# Patient Record
Sex: Female | Born: 1935
Health system: Southern US, Community
[De-identification: ages and names within clinical notes are randomized; demographics above are authoritative.]

## PROBLEM LIST (undated history)

## (undated) DIAGNOSIS — J309 Allergic rhinitis, unspecified: Secondary | ICD-10-CM

## (undated) DIAGNOSIS — I639 Cerebral infarction, unspecified: Secondary | ICD-10-CM

## (undated) DIAGNOSIS — B3731 Acute candidiasis of vulva and vagina: Secondary | ICD-10-CM

## (undated) DIAGNOSIS — M8580 Other specified disorders of bone density and structure, unspecified site: Secondary | ICD-10-CM

## (undated) DIAGNOSIS — R159 Full incontinence of feces: Secondary | ICD-10-CM

## (undated) DIAGNOSIS — M1990 Unspecified osteoarthritis, unspecified site: Secondary | ICD-10-CM

## (undated) DIAGNOSIS — M899 Disorder of bone, unspecified: Secondary | ICD-10-CM

## (undated) DIAGNOSIS — I10 Essential (primary) hypertension: Secondary | ICD-10-CM

## (undated) DIAGNOSIS — D219 Benign neoplasm of connective and other soft tissue, unspecified: Secondary | ICD-10-CM

## (undated) DIAGNOSIS — J189 Pneumonia, unspecified organism: Secondary | ICD-10-CM

## (undated) DIAGNOSIS — G459 Transient cerebral ischemic attack, unspecified: Secondary | ICD-10-CM

## (undated) DIAGNOSIS — G43909 Migraine, unspecified, not intractable, without status migrainosus: Secondary | ICD-10-CM

## (undated) DIAGNOSIS — B373 Candidiasis of vulva and vagina: Secondary | ICD-10-CM

## (undated) DIAGNOSIS — M949 Disorder of cartilage, unspecified: Secondary | ICD-10-CM

## (undated) DIAGNOSIS — N3020 Other chronic cystitis without hematuria: Secondary | ICD-10-CM

## (undated) DIAGNOSIS — N8111 Cystocele, midline: Secondary | ICD-10-CM

## (undated) DIAGNOSIS — N6459 Other signs and symptoms in breast: Secondary | ICD-10-CM

## (undated) DIAGNOSIS — I480 Paroxysmal atrial fibrillation: Secondary | ICD-10-CM

## (undated) DIAGNOSIS — K449 Diaphragmatic hernia without obstruction or gangrene: Secondary | ICD-10-CM

## (undated) HISTORY — DX: Acute candidiasis of vulva and vagina: B37.31

## (undated) HISTORY — DX: Cystocele, midline: N81.11

## (undated) HISTORY — DX: Benign neoplasm of connective and other soft tissue, unspecified: D21.9

## (undated) HISTORY — DX: Paroxysmal atrial fibrillation: I48.0

## (undated) HISTORY — DX: Full incontinence of feces: R15.9

## (undated) HISTORY — DX: Other specified disorders of bone density and structure, unspecified site: M85.80

## (undated) HISTORY — PX: DILATION AND CURETTAGE OF UTERUS: SHX78

## (undated) HISTORY — DX: Disorder of cartilage, unspecified: M94.9

## (undated) HISTORY — DX: Other chronic cystitis without hematuria: N30.20

## (undated) HISTORY — DX: Allergic rhinitis, unspecified: J30.9

## (undated) HISTORY — DX: Transient cerebral ischemic attack, unspecified: G45.9

## (undated) HISTORY — DX: Candidiasis of vulva and vagina: B37.3

## (undated) HISTORY — PX: CATARACT EXTRACTION W/ INTRAOCULAR LENS IMPLANT: SHX1309

## (undated) HISTORY — DX: Other signs and symptoms in breast: N64.59

## (undated) HISTORY — DX: Disorder of bone, unspecified: M89.9

---
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
## 1998-01-01 ENCOUNTER — Other Ambulatory Visit: Admission: RE | Admit: 1998-01-01 | Discharge: 1998-01-01 | Payer: Self-pay | Admitting: *Deleted

## 1999-01-07 ENCOUNTER — Other Ambulatory Visit: Admission: RE | Admit: 1999-01-07 | Discharge: 1999-01-07 | Payer: Self-pay | Admitting: *Deleted

## 1999-01-08 ENCOUNTER — Encounter (INDEPENDENT_AMBULATORY_CARE_PROVIDER_SITE_OTHER): Payer: Self-pay

## 1999-01-08 ENCOUNTER — Other Ambulatory Visit: Admission: RE | Admit: 1999-01-08 | Discharge: 1999-01-08 | Payer: Self-pay | Admitting: *Deleted

## 1999-03-16 ENCOUNTER — Encounter (INDEPENDENT_AMBULATORY_CARE_PROVIDER_SITE_OTHER): Payer: Self-pay | Admitting: Specialist

## 1999-03-16 ENCOUNTER — Ambulatory Visit (HOSPITAL_COMMUNITY): Admission: RE | Admit: 1999-03-16 | Discharge: 1999-03-16 | Payer: Self-pay | Admitting: *Deleted

## 1999-03-16 HISTORY — PX: HYSTEROSCOPY WITH D & C: SHX1775

## 2000-01-27 ENCOUNTER — Other Ambulatory Visit: Admission: RE | Admit: 2000-01-27 | Discharge: 2000-01-27 | Payer: Self-pay | Admitting: *Deleted

## 2005-09-17 HISTORY — PX: VAGINAL HYSTERECTOMY: SUR661

## 2005-10-06 ENCOUNTER — Encounter (INDEPENDENT_AMBULATORY_CARE_PROVIDER_SITE_OTHER): Payer: Self-pay | Admitting: *Deleted

## 2005-10-06 ENCOUNTER — Ambulatory Visit (HOSPITAL_COMMUNITY): Admission: RE | Admit: 2005-10-06 | Discharge: 2005-10-07 | Payer: Self-pay | Admitting: Obstetrics and Gynecology

## 2005-12-24 ENCOUNTER — Ambulatory Visit: Payer: Self-pay | Admitting: Family Medicine

## 2006-02-11 ENCOUNTER — Ambulatory Visit: Payer: Self-pay | Admitting: Family Medicine

## 2006-02-25 ENCOUNTER — Ambulatory Visit: Payer: Self-pay | Admitting: Family Medicine

## 2006-03-03 ENCOUNTER — Ambulatory Visit: Payer: Self-pay | Admitting: Family Medicine

## 2006-04-13 ENCOUNTER — Ambulatory Visit: Payer: Self-pay | Admitting: Family Medicine

## 2007-06-12 ENCOUNTER — Ambulatory Visit: Payer: Self-pay | Admitting: Family Medicine

## 2007-08-25 ENCOUNTER — Ambulatory Visit: Payer: Self-pay | Admitting: Family Medicine

## 2008-12-17 ENCOUNTER — Ambulatory Visit: Payer: Self-pay | Admitting: Family Medicine

## 2009-01-02 ENCOUNTER — Ambulatory Visit: Payer: Self-pay | Admitting: Family Medicine

## 2009-03-06 ENCOUNTER — Ambulatory Visit: Payer: Self-pay | Admitting: Family Medicine

## 2010-04-02 ENCOUNTER — Ambulatory Visit: Payer: Self-pay | Admitting: Family Medicine

## 2010-09-04 NOTE — Op Note (Signed)
[name], [name]                 ACCOUNT NO.:  [id number]   MEDICAL RECORD NO.:  [id number]          PATIENT TYPE:  OIB   LOCATION:  9306                          FACILITY:  [facility]   PHYSICIAN:  Sherry A. Dickstein, M.D.DATE OF BIRTH:  09/20/1930   DATE OF PROCEDURE:  10/06/2005  DATE OF DISCHARGE:                                 OPERATIVE REPORT   PREOPERATIVE DIAGNOSIS:  Cystocele.   POSTOPERATIVE DIAGNOSES:  1.  Cystocele.  2.  Uterine prolapse.  3.  Urethral caruncle.   PROCEDURES:  1.  Total vaginal hysterectomy.  2.  Anterior repair.  3.  Removal of urethral caruncle.   SURGEON:  Sherry A. Rosalio Macadamia, M.D.   ASSISTANT:  Maxie Better, M.D.   ANESTHESIA:  Spinal.   INDICATIONS:  This is a 77 year old G2, P2-0-0-2, woman who has had  significant symptomatic cystocele that has been getting worse over the past  several months.  The patient complains of pelvic pressure, vaginal pressure  and feeling like her bladder is dropping out of the vagina.  Because of this  the patient was evaluated and found to have a second to third degree  cystocele.  At the time in the office, the cervix was felt to not be  prolapsing and therefore the patient was prepared for an anterior repair.  Preoperatively it was explained to the patient that if at the time of  surgery when she was relaxed the cervix was descending to the introitus,  then a vaginal hysterectomy would be performed at that time.  Therefore, the  patient was brought to the operating room for cystocele repair and  evaluation of her cervix and uterus.   FINDINGS:  Second to third degree cystocele, urethral caruncle and second to  third degree cervical-uterine prolapse.  Normal fallopian tubes.  Normal  atrophic ovaries.   PROCEDURE:  The patient was brought into the operating room and given  adequate spinal anesthesia.  She was placed in a dorsal lithotomy position.  Her perineum and vagina were washed with Betadine.   She was draped in  sterile fashion.  A Foley catheter was inserted into the bladder.  A  weighted speculum was placed within the vagina.  Once the speculum was  placed and a Deaver retractor was placed, a single-tooth tenaculum was  placed on the cervix.  With just very moderate pressure the cervix was  brought down to within 1-2 cm of the introitus.  Because of this descensus,  it was felt that a vaginal hysterectomy was indicated.  Therefore, a  Jacobson tenaculum was placed on the anterior lip of the cervix.  The cervix  was infiltrated with 1% Xylocaine with 1:100,000 epinephrine.  The cervix  was circumcised circumferentially.  The vaginal mucosa was dissected off of  the cervix with blunt dissection.  The posterior cul-de-sac was identified  and it was entered sharply.  The posterior vaginal wall was then reefed with  a 0 Vicryl running locked stitch for hemostasis.  Heaney clamps were placed  across the uterosacral ligaments on either side.  These were clamped, cut,  and suture ligated.  The bladder was developed off of the cervix with blunt  dissection.  The anterior peritoneum was identified.  It was entered sharply  and a Deaver retractor was placed within the space.  Then using a long  LigaSure on alternating sides, the cardinal ligaments were clamped,  cauterized x3, and then cut.  This was done until the utero-ovarian  ligaments could be well-identified.  The round ligaments and top of the  utero-ovarian ligaments were then clamped, cut and suture ligated with 0  Vicryl LigaSure.  The uterus was removed in this fashion.  There were small  bleeders between the uterosacral ligaments and the end of the vaginal  reefing stitch.  These areas were then closed with 0 Vicryl in figure-of-  eight stitches.  Adequate hemostasis was obtained along this area.  A  posterior plication stitch was taken with 0 Vicryl from the uterosacral  ligament across the posterior cul-de-sac to the next  uterosacral ligament.  This was not tied at this point in time.  All sutures were felt to have  adequate hemostasis.  The posterior vaginal wall was then closed using 0  Vicryl in figure-of-eight stitches.  The peritoneal Kelly plication stitch  was then tied separately.  The cystocele repair was then performed.   The vaginal mucosa was dissected off the bladder with blunt and sharp  dissection.  This was performed up to the UV junction.  There was a very  distinct UV junction so that the dissection was not necessary to be taken up  to the urethra.  Once the vaginal mucosa was well-dissected laterally, the  endopelvic fascia was able to be distinguished easily and Kelly plication  stitches were taken across the endopelvic fascia with 2-0 Vicryl in mattress-  type stitches.  There was good support to the bladder.  The extra vaginal  mucosa was excised.  The vagina was then closed with 2-0 chromic in a  running locked stitch.  There was still a small amount of space between this  stitch and the figure-of-eight 0 Vicryl stitches.  This small space was  closed with another 0 Vicryl figure-of-eight stitch.  Adequate hemostasis  was present.  The vagina was then packed with 2-inch plain packing that was  coated with Estrace cream.  There was a urethral caruncle present.  This was  excised using a scalpel.  The edges were then closed using 4-0 chromic in  figure-of-eight interrupted stitches.  Adequate hemostasis was present.  Marcaine 0.25% with epinephrine was injected into this site to decrease any  bleeding and for future pain relief.  The patient was then taken out of the  dorsal lithotomy position.  She was awakened.  She was moved from the  operating table to a stretcher in stable condition.   COMPLICATIONS:  None.   ESTIMATED BLOOD LOSS:  150 mL.   SPECIMENS:  1.  Uterus.  2.  Urethral caruncle.   Specimens were sent to pathology.      Sherry A. Rosalio Macadamia, M.D. Electronically  Signed     SAD/MEDQ  D:  10/06/2005  T:  10/07/2005  Job:  [id number]

## 2010-09-04 NOTE — Op Note (Signed)
[facility]  Patient:    DEZTINY SARRA                         MRN: [id number] Proc. Date: 03/16/99 Adm. Date:  [id number] Attending:  Ardeen Fillers CC:         Sung Amabile. Roslyn Smiling, M.D.                           Operative Report  INDICATIONS:                  [age]-year-old woman, G2, P2, receiving Prempro 5 mg, with spotting in July of this year.  Endometrial biopsy was performed at her annual examination and pathology revealed only benign endocervical tissue and mucus.  Normal endometrium was identified and pelvic ultrasound and sonohysterogram suggested an endometrial polyp.  She is admitted now for operative hysteroscopy and D&C.  PREOPERATIVE DIAGNOSES:       Postmenopausal bleeding.  Ultrasound consistent with endometrial polyp.  POSTOPERATIVE DIAGNOSES:      Postmenopausal bleeding.  Ultrasound consistent with endometrial polyp.  PROCEDURE:                    Operative hysteroscopy, dilatation and curettage.  SURGEON:                      Sung Amabile. Roslyn Smiling, M.D.  ANESTHESIA:                   MAC and paracervical block.  ESTIMATED BLOOD LOSS:         Less than 10 cc.  TUBES AND DRAINS:             None.  COMPLICATIONS:                None.  FINDINGS:                     Anteverted uterus sounded to 8 cm.  Polypoid mass in endometrial cavity.  SPECIMENS:                    Endometrial biopsies and curettings to pathology.  DESCRIPTION OF PROCEDURE:     After the establishment of adequate IV sedation, he patient was placed in the dorsal lithotomy position.  The perineum and vagina were prepped with Betadine solution.  The patient had emptied her bladder just before being transported to the operating room.  Patient was draped.  Examination under anesthesia was carried out.  Bivalved speculum was inserted into the vagina. The cervix was reprepped with Betadine solution.  The anterior cervical lip was infiltrated with 1%  Xylocaine and then grasped with a single-tooth tenaculum. Paracervical block was placed in the usual fashion using 1% Nesacaine (20 cc).   The uterine cavity was sounded to 8 cm.  Pratt dilators were used to serially dilate the cervical canal to a #33-French.  Operative hysteroscope was introduced easily into the endometrial cavity.  Sorbitol was the distending medium; it was  instilled at 90 mmHg pressure.  A polypoid mass was noted.  Double-loop 90 degree electrical loop was used to excise the polypoid mass with settings of 190 and 110, cutting and coagulation, respectively.  Fragments of the polypoid mass were removed.  The scope was removed.  Sharp curettage was performed.  The scope was  reinserted and residual small fragments  of tissue were removed.  Hemostasis was  noted and instruments were removed.  Pressure was held against the cervix. Hemostasis was noted.  Patient was returned to the supine position and transported to the recovery room in satisfactory condition.  Fluid deficit was 50 cc. DD:  03/16/99 TD:  03/16/99 Job: [id number]

## 2011-02-24 ENCOUNTER — Encounter (INDEPENDENT_AMBULATORY_CARE_PROVIDER_SITE_OTHER): Payer: Self-pay | Admitting: Surgery

## 2011-03-09 ENCOUNTER — Ambulatory Visit (INDEPENDENT_AMBULATORY_CARE_PROVIDER_SITE_OTHER): Payer: 59 | Admitting: Surgery

## 2011-04-03 ENCOUNTER — Other Ambulatory Visit: Payer: Self-pay | Admitting: Family Medicine

## 2011-04-14 ENCOUNTER — Telehealth: Payer: Self-pay | Admitting: Family Medicine

## 2011-04-14 NOTE — Telephone Encounter (Signed)
Left message

## 2011-06-22 ENCOUNTER — Encounter: Payer: Self-pay | Admitting: Internal Medicine

## 2011-06-29 ENCOUNTER — Encounter: Payer: Self-pay | Admitting: Family Medicine

## 2011-06-29 ENCOUNTER — Ambulatory Visit
Admission: RE | Admit: 2011-06-29 | Discharge: 2011-06-29 | Disposition: A | Discharge status: Home / Self Care | Payer: Medicare Other | Source: Ambulatory Visit | Attending: Family Medicine | Admitting: Family Medicine

## 2011-06-29 ENCOUNTER — Ambulatory Visit (INDEPENDENT_AMBULATORY_CARE_PROVIDER_SITE_OTHER): Payer: Medicare Other | Admitting: Family Medicine

## 2011-06-29 VITALS — BP 138/88 | HR 51 | Wt 160.0 lb

## 2011-06-29 DIAGNOSIS — M949 Disorder of cartilage, unspecified: Secondary | ICD-10-CM

## 2011-06-29 DIAGNOSIS — I10 Essential (primary) hypertension: Secondary | ICD-10-CM

## 2011-06-29 DIAGNOSIS — J309 Allergic rhinitis, unspecified: Secondary | ICD-10-CM | POA: Insufficient documentation

## 2011-06-29 DIAGNOSIS — M8580 Other specified disorders of bone density and structure, unspecified site: Secondary | ICD-10-CM

## 2011-06-29 DIAGNOSIS — M25511 Pain in right shoulder: Secondary | ICD-10-CM

## 2011-06-29 DIAGNOSIS — M25519 Pain in unspecified shoulder: Secondary | ICD-10-CM

## 2011-06-29 DIAGNOSIS — Z79899 Other long term (current) drug therapy: Secondary | ICD-10-CM

## 2011-06-29 DIAGNOSIS — E785 Hyperlipidemia, unspecified: Secondary | ICD-10-CM | POA: Insufficient documentation

## 2011-06-29 LAB — COMPREHENSIVE METABOLIC PANEL
BUN: 23 mg/dL (ref 6–23)
CO2: 23 mEq/L (ref 19–32)
Calcium: 9.5 mg/dL (ref 8.4–10.5)
Chloride: 101 mEq/L (ref 96–112)
Creat: 0.78 mg/dL (ref 0.50–1.10)

## 2011-06-29 LAB — CBC WITH DIFFERENTIAL/PLATELET
Eosinophils Relative: 4 % (ref 0–5)
HCT: 45 % (ref 36.0–46.0)
Lymphocytes Relative: 41 % (ref 12–46)
Lymphs Abs: 3.3 10*3/uL (ref 0.7–4.0)
MCV: 90.5 fL (ref 78.0–100.0)
Monocytes Absolute: 0.8 10*3/uL (ref 0.1–1.0)
Monocytes Relative: 11 % (ref 3–12)
RBC: 4.97 MIL/uL (ref 3.87–5.11)
RDW: 14.9 % (ref 11.5–15.5)
WBC: 7.9 10*3/uL (ref 4.0–10.5)

## 2011-06-29 LAB — LIPID PANEL
Cholesterol: 210 mg/dL — ABNORMAL HIGH (ref 0–200)
HDL: 49 mg/dL (ref >39)
Total CHOL/HDL Ratio: 4.3 Ratio

## 2011-06-29 MED ORDER — HYDROCHLOROTHIAZIDE 12.5 MG PO CAPS: 12.5000 mg | ORAL_CAPSULE | ORAL | Status: DC

## 2011-06-29 NOTE — Progress Notes (Signed)
  Subjective:    Patient ID: [name], female    DOB: 03/23/1931, 78 y.o.   MRN: [id number]  HPI She is here for an interval evaluation. She continues on medications listed in the chart and is having no difficulty with them. She has made some dietary changes to help with her cholesterol. She plans to see her gynecologist soon and will order another DEXA scan. She does complain of a 6 month history of difficulty with right shoulder pain and blames this on doing yard work. She has noted decreased range of motion due to pain because of this. She does state that this is roughly 50% better. She has mild allergies and uses OTC meds but her  Review of Systems Negative except as above    Objective:   Physical Exam alert and in no distress. Tympanic membranes and canals are normal. Throat is clear. Tonsils are normal. Neck is supple without adenopathy or thyromegaly. Cardiac exam shows a regular sinus rhythm without murmurs or gallops. Lungs are clear to auscultation. Right shoulder exam shows decreased range of motion with abduction and internal as well as external rotation area crepitus is noted upon the Neer's and Hawkins testing. No laxity noted.      Assessment & Plan:   1. Hypertension  CBC with Differential, Comprehensive metabolic panel  2. Hyperlipidemia LDL goal < 130  Lipid panel  3. Osteopenia    4. Right shoulder pain  DG Shoulder Right  5. Allergic rhinitis, mild    6. Encounter for long-term (current) use of other medications  CBC with Differential, Comprehensive metabolic panel, Lipid panel   she is to continue on her present medication regimen.

## 2011-06-29 NOTE — Patient Instructions (Signed)
I will call you with the results of the xray 

## 2011-06-30 NOTE — Progress Notes (Signed)
Quick Note:  The blood work is normal ______ 

## 2011-10-07 ENCOUNTER — Encounter: Payer: Self-pay | Admitting: Cardiology

## 2011-10-07 ENCOUNTER — Other Ambulatory Visit: Payer: Self-pay | Admitting: Cardiology

## 2012-04-19 DIAGNOSIS — I639 Cerebral infarction, unspecified: Secondary | ICD-10-CM

## 2012-04-19 HISTORY — DX: Cerebral infarction, unspecified: I63.9

## 2012-07-02 ENCOUNTER — Other Ambulatory Visit: Payer: Self-pay | Admitting: Family Medicine

## 2012-08-01 ENCOUNTER — Encounter: Payer: Self-pay | Admitting: Family Medicine

## 2012-08-01 ENCOUNTER — Ambulatory Visit (INDEPENDENT_AMBULATORY_CARE_PROVIDER_SITE_OTHER): Payer: Medicare Other | Admitting: Family Medicine

## 2012-08-01 VITALS — BP 130/86 | HR 66 | Wt 160.0 lb

## 2012-08-01 DIAGNOSIS — M949 Disorder of cartilage, unspecified: Secondary | ICD-10-CM

## 2012-08-01 DIAGNOSIS — Z79899 Other long term (current) drug therapy: Secondary | ICD-10-CM

## 2012-08-01 DIAGNOSIS — E785 Hyperlipidemia, unspecified: Secondary | ICD-10-CM

## 2012-08-01 DIAGNOSIS — I10 Essential (primary) hypertension: Secondary | ICD-10-CM

## 2012-08-01 DIAGNOSIS — M8580 Other specified disorders of bone density and structure, unspecified site: Secondary | ICD-10-CM

## 2012-08-01 DIAGNOSIS — M899 Disorder of bone, unspecified: Secondary | ICD-10-CM

## 2012-08-01 DIAGNOSIS — J309 Allergic rhinitis, unspecified: Secondary | ICD-10-CM

## 2012-08-01 LAB — LIPID PANEL
LDL Cholesterol: 124 mg/dL — ABNORMAL HIGH (ref 0–99)
Triglycerides: 173 mg/dL — ABNORMAL HIGH (ref <150)
VLDL: 35 mg/dL (ref 0–40)

## 2012-08-01 LAB — COMPREHENSIVE METABOLIC PANEL
ALT: 14 U/L (ref 0–35)
AST: 18 U/L (ref 0–37)
Albumin: 4.5 g/dL (ref 3.5–5.2)
Alkaline Phosphatase: 57 U/L (ref 39–117)
Glucose, Bld: 95 mg/dL (ref 70–99)
Potassium: 3.9 mEq/L (ref 3.5–5.3)
Sodium: 139 mEq/L (ref 135–145)
Total Protein: 7.2 g/dL (ref 6.0–8.3)

## 2012-08-01 LAB — CBC WITH DIFFERENTIAL/PLATELET
Basophils Absolute: 0 10*3/uL (ref 0.0–0.1)
Basophils Relative: 1 % (ref 0–1)
Hemoglobin: 14.5 g/dL (ref 12.0–15.0)
MCHC: 33.5 g/dL (ref 30.0–36.0)
Monocytes Relative: 9 % (ref 3–12)
Neutro Abs: 3.1 10*3/uL (ref 1.7–7.7)
Neutrophils Relative %: 40 % — ABNORMAL LOW (ref 43–77)

## 2012-08-01 MED ORDER — HYDROCHLOROTHIAZIDE 12.5 MG PO CAPS: ORAL_CAPSULE | ORAL | Status: DC

## 2012-08-01 MED ORDER — AZELASTINE HCL 0.1 % NA SOLN: 1.0000 | Freq: Two times a day (BID) | NASAL | Status: DC

## 2012-08-01 NOTE — Patient Instructions (Signed)
Keep taking good care of yourself 

## 2012-08-01 NOTE — Progress Notes (Signed)
  Subjective:    Patient ID: LAQUESHIA CIHLAR, female    DOB: 1931/02/19, 79 y.o.   MRN: [id number]  HPI She is here for medication check. She continues on Microzide for her blood pressure and is having no difficulty with this. She has a previous history of hyperlipidemia but presently is on no medications. She does have osteopenia and takes multivitamins with calcium. She did have her vitamin D checked by her gynecologist. Her allergies still give her some trouble when she does use Astelin which gives her good relief. She keeps himself busy. She has no other concerns or complaints. Social history was reviewed. Her marriage continues to do quite well.   Review of Systems     Objective:   Physical Exam alert and in no distress. Tympanic membranes and canals are normal. Throat is clear. Tonsils are normal. Neck is supple without adenopathy or thyromegaly. Cardiac exam shows a regular sinus rhythm without murmurs or gallops. Lungs are clear to auscultation.        Assessment & Plan:  Hypertension - Plan: hydrochlorothiazide (MICROZIDE) 12.5 MG capsule, CBC with Differential, Comprehensive metabolic panel  Hyperlipidemia LDL goal < 130 - Plan: Lipid panel  Osteopenia - Plan: CBC with Differential, Comprehensive metabolic panel  Allergic rhinitis, mild - Plan: azelastine (ASTELIN) 137 MCG/SPRAY nasal spray  Encounter for long-term (current) use of other medications - Plan: Lipid panel, CBC with Differential, Comprehensive metabolic panel encouraged her to maintain her active lifestyle. She will let me know if she needs more medications for her underlying allergies.

## 2012-08-02 NOTE — Progress Notes (Signed)
Quick Note:  PATIENT INFORMED LABS UNCHANGED PATIENT VERBALIZED UNDERSTANDING ______

## 2012-08-26 ENCOUNTER — Emergency Department (HOSPITAL_COMMUNITY): Payer: Medicare Other

## 2012-08-26 ENCOUNTER — Observation Stay (HOSPITAL_COMMUNITY)
Admission: EM | Admit: 2012-08-26 | Discharge: 2012-08-27 | Disposition: A | Discharge status: Home / Self Care | Payer: Medicare Other | Attending: Internal Medicine | Admitting: Internal Medicine

## 2012-08-26 ENCOUNTER — Telehealth: Payer: Self-pay | Admitting: Family Medicine

## 2012-08-26 ENCOUNTER — Observation Stay (HOSPITAL_COMMUNITY): Payer: Medicare Other

## 2012-08-26 ENCOUNTER — Encounter (HOSPITAL_COMMUNITY): Payer: Self-pay | Admitting: Cardiology

## 2012-08-26 DIAGNOSIS — R42 Dizziness and giddiness: Secondary | ICD-10-CM | POA: Insufficient documentation

## 2012-08-26 DIAGNOSIS — R471 Dysarthria and anarthria: Secondary | ICD-10-CM | POA: Insufficient documentation

## 2012-08-26 DIAGNOSIS — I10 Essential (primary) hypertension: Secondary | ICD-10-CM

## 2012-08-26 DIAGNOSIS — J309 Allergic rhinitis, unspecified: Secondary | ICD-10-CM | POA: Insufficient documentation

## 2012-08-26 DIAGNOSIS — G459 Transient cerebral ischemic attack, unspecified: Principal | ICD-10-CM

## 2012-08-26 DIAGNOSIS — E785 Hyperlipidemia, unspecified: Secondary | ICD-10-CM | POA: Insufficient documentation

## 2012-08-26 DIAGNOSIS — N390 Urinary tract infection, site not specified: Secondary | ICD-10-CM | POA: Diagnosis present

## 2012-08-26 DIAGNOSIS — Z8673 Personal history of transient ischemic attack (TIA), and cerebral infarction without residual deficits: Secondary | ICD-10-CM | POA: Diagnosis present

## 2012-08-26 DIAGNOSIS — N3020 Other chronic cystitis without hematuria: Secondary | ICD-10-CM | POA: Insufficient documentation

## 2012-08-26 DIAGNOSIS — Z87448 Personal history of other diseases of urinary system: Secondary | ICD-10-CM | POA: Diagnosis present

## 2012-08-26 HISTORY — DX: Essential (primary) hypertension: I10

## 2012-08-26 LAB — CBC
HCT: 44.8 % (ref 36.0–46.0)
HCT: 43.4 % (ref 36.0–46.0)
Hemoglobin: 15.3 g/dL — ABNORMAL HIGH (ref 12.0–15.0)
Hemoglobin: 14.9 g/dL (ref 12.0–15.0)
MCH: 29.8 pg (ref 26.0–34.0)
MCH: 29.8 pg (ref 26.0–34.0)
MCHC: 34.2 g/dL (ref 30.0–36.0)
MCHC: 34.3 g/dL (ref 30.0–36.0)
MCV: 87.2 fL (ref 78.0–100.0)
Platelets: 296 10*3/uL (ref 150–400)
RBC: 5.14 MIL/uL — ABNORMAL HIGH (ref 3.87–5.11)
RBC: 5 MIL/uL (ref 3.87–5.11)
RDW: 14 % (ref 11.5–15.5)
WBC: 9 10*3/uL (ref 4.0–10.5)

## 2012-08-26 LAB — HEMOGLOBIN A1C: Hgb A1c MFr Bld: 5.8 % — ABNORMAL HIGH (ref <5.7)

## 2012-08-26 LAB — URINALYSIS, ROUTINE W REFLEX MICROSCOPIC
Bilirubin Urine: NEGATIVE
Glucose, UA: NEGATIVE mg/dL
Hgb urine dipstick: NEGATIVE
Ketones, ur: NEGATIVE mg/dL
Nitrite: NEGATIVE
Protein, ur: NEGATIVE mg/dL
Specific Gravity, Urine: 1.014 (ref 1.005–1.030)
Urobilinogen, UA: 0.2 mg/dL (ref 0.0–1.0)
pH: 5.5 (ref 5.0–8.0)

## 2012-08-26 LAB — PROTIME-INR
INR: 0.96 (ref 0.00–1.49)
Prothrombin Time: 12.7 seconds (ref 11.6–15.2)

## 2012-08-26 LAB — COMPREHENSIVE METABOLIC PANEL WITH GFR
Albumin: 3.9 g/dL (ref 3.5–5.2)
BUN: 23 mg/dL (ref 6–23)
Creatinine, Ser: 0.82 mg/dL (ref 0.50–1.10)
Total Protein: 7.6 g/dL (ref 6.0–8.3)

## 2012-08-26 LAB — RAPID URINE DRUG SCREEN, HOSP PERFORMED
Amphetamines: NOT DETECTED
Barbiturates: NOT DETECTED
Benzodiazepines: NOT DETECTED
Cocaine: NOT DETECTED
Opiates: NOT DETECTED
Tetrahydrocannabinol: NOT DETECTED

## 2012-08-26 LAB — COMPREHENSIVE METABOLIC PANEL
ALT: 18 U/L (ref 0–35)
AST: 24 U/L (ref 0–37)
Alkaline Phosphatase: 58 U/L (ref 39–117)
CO2: 25 mEq/L (ref 19–32)
Calcium: 10 mg/dL (ref 8.4–10.5)
Chloride: 100 mEq/L (ref 96–112)
GFR calc Af Amer: 76 mL/min — ABNORMAL LOW (ref >90)
GFR calc non Af Amer: 66 mL/min — ABNORMAL LOW (ref >90)
Glucose, Bld: 92 mg/dL (ref 70–99)
Potassium: 3.7 mEq/L (ref 3.5–5.1)
Sodium: 141 mEq/L (ref 135–145)
Total Bilirubin: 0.2 mg/dL — ABNORMAL LOW (ref 0.3–1.2)

## 2012-08-26 LAB — POCT I-STAT, CHEM 8
BUN: 23 mg/dL (ref 6–23)
Calcium, Ion: 1.21 mmol/L (ref 1.13–1.30)
Chloride: 106 meq/L (ref 96–112)
Creatinine, Ser: 0.8 mg/dL (ref 0.50–1.10)
Glucose, Bld: 91 mg/dL (ref 70–99)
HCT: 46 % (ref 36.0–46.0)
Hemoglobin: 15.6 g/dL — ABNORMAL HIGH (ref 12.0–15.0)
Potassium: 3.5 mEq/L (ref 3.5–5.1)
Sodium: 140 meq/L (ref 135–145)
TCO2: 27 mmol/L (ref 0–100)

## 2012-08-26 LAB — DIFFERENTIAL
Basophils Absolute: 0.1 10*3/uL (ref 0.0–0.1)
Basophils Relative: 1 % (ref 0–1)
Eosinophils Absolute: 0.3 K/uL (ref 0.0–0.7)
Eosinophils Relative: 3 % (ref 0–5)
Lymphocytes Relative: 43 % (ref 12–46)
Lymphs Abs: 3.8 10*3/uL (ref 0.7–4.0)
Monocytes Absolute: 0.9 K/uL (ref 0.1–1.0)
Monocytes Relative: 10 % (ref 3–12)
Neutro Abs: 3.9 10*3/uL (ref 1.7–7.7)
Neutrophils Relative %: 43 % (ref 43–77)

## 2012-08-26 LAB — URINE MICROSCOPIC-ADD ON

## 2012-08-26 LAB — TROPONIN I: Troponin I: <0.3 ng/mL (ref <0.30)

## 2012-08-26 LAB — ETHANOL: Alcohol, Ethyl (B): <11 mg/dL (ref 0–11)

## 2012-08-26 LAB — POCT I-STAT TROPONIN I: Troponin i, poc: 0 ng/mL (ref 0.00–0.08)

## 2012-08-26 LAB — TSH: TSH: 3.241 u[IU]/mL (ref 0.350–4.500)

## 2012-08-26 LAB — CREATININE, SERUM: GFR calc non Af Amer: 63 mL/min — ABNORMAL LOW (ref >90)

## 2012-08-26 LAB — APTT: aPTT: 27 seconds (ref 24–37)

## 2012-08-26 MED ORDER — HYDRALAZINE HCL 20 MG/ML IJ SOLN: 10.0000 mg | Freq: Four times a day (QID) | INTRAMUSCULAR | Status: DC | PRN

## 2012-08-26 MED ORDER — HEPARIN SODIUM (PORCINE) 5000 UNIT/ML IJ SOLN
5000.0000 [IU] | Freq: Three times a day (TID) | INTRAMUSCULAR | Status: DC
  Administered 2012-08-26 – 2012-08-27 (×2): 5000 [IU] via SUBCUTANEOUS
  Filled 2012-08-26 (×8): qty 1

## 2012-08-26 MED ORDER — ACETAMINOPHEN 325 MG PO TABS
650.0000 mg | ORAL_TABLET | Freq: Four times a day (QID) | ORAL | Status: DC | PRN
  Administered 2012-08-26: 650 mg via ORAL
  Filled 2012-08-26: qty 2

## 2012-08-26 MED ORDER — HYDROCHLOROTHIAZIDE 12.5 MG PO CAPS
12.5000 mg | ORAL_CAPSULE | Freq: Every day | ORAL | Status: DC
  Administered 2012-08-27: 12.5 mg via ORAL
  Filled 2012-08-26: qty 1

## 2012-08-26 MED ORDER — ASPIRIN 81 MG PO CHEW
81.0000 mg | CHEWABLE_TABLET | Freq: Once | ORAL | Status: AC
  Administered 2012-08-26: 81 mg via ORAL
  Filled 2012-08-26: qty 1

## 2012-08-26 MED ORDER — LORAZEPAM 2 MG/ML IJ SOLN
0.5000 mg | Freq: Once | INTRAMUSCULAR | Status: AC
  Administered 2012-08-26: 0.5 mg via INTRAVENOUS
  Filled 2012-08-26: qty 1

## 2012-08-26 MED ORDER — LORATADINE 10 MG PO TABS
5.0000 mg | ORAL_TABLET | Freq: Every day | ORAL | Status: DC | PRN
  Filled 2012-08-26: qty 0.5

## 2012-08-26 MED ORDER — POLYVINYL ALCOHOL 1.4 % OP SOLN
2.0000 [drp] | Freq: Four times a day (QID) | OPHTHALMIC | Status: DC | PRN
  Filled 2012-08-26: qty 15

## 2012-08-26 MED ORDER — POLYETHYL GLYCOL-PROPYL GLYCOL 0.4-0.3 % OP SOLN: 2.0000 [drp] | Freq: Four times a day (QID) | OPHTHALMIC | Status: DC | PRN

## 2012-08-26 MED ORDER — CALCIUM CARBONATE-VITAMIN D 500-200 MG-UNIT PO TABS
1.0000 | ORAL_TABLET | Freq: Every day | ORAL | Status: DC
  Administered 2012-08-26 – 2012-08-27 (×2): 1 via ORAL
  Filled 2012-08-26 (×3): qty 1

## 2012-08-26 MED ORDER — CLOPIDOGREL BISULFATE 75 MG PO TABS
75.0000 mg | ORAL_TABLET | Freq: Every day | ORAL | Status: DC
  Administered 2012-08-27: 75 mg via ORAL
  Filled 2012-08-26: qty 1

## 2012-08-26 MED ORDER — CALCIUM CARBONATE-VIT D-MIN 600-400 MG-UNIT PO TABS: 1.0000 | ORAL_TABLET | Freq: Every day | ORAL | Status: DC

## 2012-08-26 MED ORDER — LORAZEPAM BOLUS VIA INFUSION: 0.5000 mg | Freq: Once | INTRAVENOUS | Status: DC

## 2012-08-26 MED ORDER — SIMVASTATIN 20 MG PO TABS
20.0000 mg | ORAL_TABLET | Freq: Every day | ORAL | Status: DC
  Administered 2012-08-26: 20 mg via ORAL
  Filled 2012-08-26 (×3): qty 1

## 2012-08-26 MED ORDER — SODIUM CHLORIDE 0.9 % IV SOLN: INTRAVENOUS | Status: AC

## 2012-08-26 MED ORDER — AZELASTINE HCL 0.1 % NA SOLN
1.0000 | Freq: Two times a day (BID) | NASAL | Status: DC
  Administered 2012-08-26 – 2012-08-27 (×2): 1 via NASAL
  Filled 2012-08-26: qty 30

## 2012-08-26 NOTE — ED Notes (Signed)
Patient transported to CT 

## 2012-08-26 NOTE — ED Notes (Signed)
Pt reports she has had a headache for the past day with some stiffness in her head. States this morning when she woke up she felt like she could not get her words out right. States the symptoms did resolve at this time. Denies any other neuro deficits. States she has had 3 episodes where she felt like she could not get her words out right this morning. Pt currently with no neuro deficits.

## 2012-08-26 NOTE — ED Provider Notes (Signed)
History     CSN: [id number]  Arrival date & time 08/26/12  1038   First MD Initiated Contact with Patient 08/26/12 1054      Chief Complaint  Patient presents with  . Headache  . Aphasia    (Consider location/radiation/quality/duration/timing/severity/associated sxs/prior treatment) HPI Comments: Patient with no history of coronary disease, stroke or TIA reports that she got up as usual this morning. She reports that she felt like she had a touch of vertigo and did have a left parietal region headache that seems resolved today. When she got up this morning at 5:30 to use the bathroom, she did take her time, sat on the edge of the bed for about 5 minutes, felt normal and proceeded to go the bathroom. She reports later this morning when her spouse came downstairs and she tried to speak to him she had about 15-30 seconds of having word finding difficulty. She reports she knew what she wanted to say, but was not able to get the words out. This occurred 3 times in total over the course of this morning, and currently reports resolution of symptoms. She denied any facial droop, visual changes, focal numbness or weakness of her arms or legs. No gait abnormality. She did not take any medications specific for her symptoms. She denies any headache, neck pain, chest pain or palpitations currently.  Patient is a 79 y.o. female presenting with headaches. The history is provided by the patient and the spouse.  Headache Associated symptoms: dizziness   Associated symptoms: no abdominal pain, no fever, no nausea and no photophobia     Past Medical History  Diagnosis Date  . Candidiasis of vulva and vagina   . Other chronic cystitis   . Cystocele, midline   . Disorder of bone and cartilage, unspecified   . Other sign and symptom in breast   . Rectal incontinence   . Fibroids   . Hypertension     Past Surgical History  Procedure Laterality Date  . Hysteroscopy w/d&c  03/16/1999    Family  History  Problem Relation Age of Onset  . Hypertension Mother   . Heart disease Father     History  Substance Use Topics  . Smoking status: Never Smoker   . Smokeless tobacco: Not on file  . Alcohol Use: Yes    OB History   Grav Para Term Preterm Abortions TAB SAB Ect Mult Living                  Review of Systems  Constitutional: Negative for fever and chills.  Eyes: Negative for photophobia and visual disturbance.  Cardiovascular: Negative for chest pain and palpitations.  Gastrointestinal: Negative for nausea and abdominal pain.  Neurological: Positive for dizziness, speech difficulty and headaches. Negative for syncope, weakness and light-headedness.  All other systems reviewed and are negative.    Allergies  Review of patient's allergies indicates no known allergies.  Home Medications   Current Outpatient Rx  Name  Route  Sig  Dispense  Refill  . aspirin 81 MG tablet   Oral   Take 81 mg by mouth daily.           Marland Kitchen azelastine (ASTELIN) 137 MCG/SPRAY nasal spray   Nasal   Place 1 spray into the nose 2 (two) times daily. Use in each nostril as directed   30 mL   11   . Calcium Carbonate-Vit D-Min (CALCIUM 1200 PO)   Oral   Take 1,500 mg by  mouth.           . Cholecalciferol ([employer] VIT D 5000 HIGH-POTENCY PO)   Oral   Take by mouth.           Tilman Neat EXTRACT PO   Oral   Take by mouth.           Marland Kitchen glucosamine-chondroitin 500-400 MG tablet   Oral   Take 1 tablet by mouth 3 (three) times daily.           . hydrochlorothiazide (MICROZIDE) 12.5 MG capsule      take 1 capsule by mouth once daily OR 1 DOSE   30 capsule   11     Pt needs med check [phone]   . zolpidem (AMBIEN) 5 MG tablet   Oral   Take 5 mg by mouth at bedtime as needed for sleep.           BP 185/85  Pulse 89  Temp(Src) 97.4 F (36.3 C) (Oral)  Resp 20  SpO2 95%  Physical Exam  Nursing note and vitals reviewed. Constitutional: She is oriented to person,  place, and time. She appears well-developed and well-nourished.  Non-toxic appearance. She does not have a sickly appearance. No distress.  HENT:  Head: Normocephalic and atraumatic.  Eyes: Conjunctivae and EOM are normal. No scleral icterus.  Neck: Normal range of motion. Neck supple. No JVD present.  Cardiovascular: Normal rate, regular rhythm and intact distal pulses.   Pulmonary/Chest: Effort normal. No respiratory distress. She has no wheezes.  Abdominal: Soft. She exhibits no distension. There is no tenderness.  Neurological: She is alert and oriented to person, place, and time. No cranial nerve deficit. She exhibits normal muscle tone. Coordination normal.  No arm drift, no leg drift. 5 over 5 distal strength in both upper extremities, lower extremities. Finger to nose test bilaterally were intact. No facial droop.  Skin: Skin is warm and dry. She is not diaphoretic.    ED Course  Procedures (including critical care time)  Labs Reviewed  POCT I-STAT, CHEM 8 - Abnormal; Notable for the following:    Hemoglobin 15.6 (*)    All other components within normal limits  TROPONIN I  ETHANOL  PROTIME-INR  APTT  CBC  DIFFERENTIAL  COMPREHENSIVE METABOLIC PANEL  URINE RAPID DRUG SCREEN (HOSP PERFORMED)  URINALYSIS, ROUTINE W REFLEX MICROSCOPIC  POCT I-STAT TROPONIN I   Ct Head Wo Contrast  08/26/2012  *RADIOLOGY REPORT*  Clinical Data: Slurred speech, headache, aphasia  CT HEAD WITHOUT CONTRAST  Technique:  Contiguous axial images were obtained from the base of the skull through the vertex without contrast.  Comparison: None.  Findings: Mild new cortical volume loss noted with proportional ventricular prominence. No acute hemorrhage, acute infarction, or mass lesion is seen.  No midline shift.  No acute osseous finding.  IMPRESSION: No acute intracranial abnormality.   Original Report Authenticated By: Christiana Pellant, M.D.      1. TIA (transient ischemic attack)   2. Hypertension      EKG performed at time 10:57, shows sinus rhythm at a rate of 75, left axis deviation, poor R-wave progression noted between leads V1 through V3. Minimal nonspecific ST depression noted in leads V5 V6. Otherwise normal intervals. No significant change compared to EKG from 10/05/2005.  MDM   Patient's head CT scan shows no bleeding, no acute abnormalities according to radiologist. Patient's symptoms are concerning for a transient ischemic attack with word finding difficulty. Symptoms are resolved and  also symptoms are minor, TPA as not indicated. Patient meets criteria for TIA although it. Stroke orders have been entered, initial blood tests are unremarkable, glucose is normal. I spoke to [facility] hospitalist who has agreed to place the patient on her up status for TIA evaluation. Aspirin is ordered if the patient has not yet taken it today.        Gavin Pound. Cameryn Schum, MD 08/26/12 1203

## 2012-08-26 NOTE — Telephone Encounter (Signed)
Husband called stating that his wife had a headache yesterday, on the top of her head, different from her typical headaches.  This morning she had 2-3 short-lived episodes of trouble with her speech--trouble finding the right words.  She recognizes that she is having trouble.  Has slight headache today.  BP slightly higher than normal, 140-150/80.  No weakness or other focal symptoms.  Allergies have been bothering her a little, but no benadryl taken or other sedating meds (just 1/2 claritin this morning and astelin).  Didn't take any ambien.  Med and problem list reviewed.  I spoke with [name] as well, and her speech was fluid, normal and pt denied current problem.  Recommended ER visit--concern for TIA's. Spoke with triage nurse, husband bringing her to [facility] ER

## 2012-08-26 NOTE — H&P (Signed)
[facility] History and Physical  MCKELL RIECKE XBJ:[id number] DOB: [birth year] DOA: 08/26/2012  Referring physician: Dr. Oletta Lamas PCP: Carollee Herter, MD   Chief Complaint: Headache, aphasia  HPI: [name] is a 79 y.o. female with pmh significant for HTN, dyslipidemia, allergic rhinitis and chronic cystitis; came to the hospital after experiencing HA and dysarthria. Patient got up this morning at 5:30 to use the bathroom, feeling normal except for mild headache affecting her left side and mild dizziness. This resolved. Then she endorses some difficulty speaking to her husband (expressing what she wants to say). Also her husband reports mild dysarthria. This occurred 3 times in total over the course of morning PTA; each episode lasted about 1 minute. Currently reports resolution of symptoms. Patient and husband denies any facial droop, visual changes, focal numbness or weakness of her arms or legs. No gait abnormality. She took 2 baby ASA prior to admission. Denies neck pain, chest pain, SOB, N/V, fever, chills, dysuria or any other complaints.   Review of Systems:  Positive for transient dizziness, dysarthria and Headache. Otherwise neg except as mentioned on HPI.  Past Medical History  Diagnosis Date  . Candidiasis of vulva and vagina   . Other chronic cystitis   . Cystocele, midline   . Disorder of bone and cartilage, unspecified   . Other sign and symptom in breast   . Rectal incontinence   . Fibroids   . Hypertension    Past Surgical History  Procedure Laterality Date  . Hysteroscopy w/d&c  03/16/1999   Social History:  reports that she has never smoked. She does not have any smokeless tobacco history on file. She reports that  drinks alcohol. She reports that she does not use illicit drugs. Lives at home with husband; no need for assistance with ADL's  No Known Allergies  Family History  Problem Relation Age of Onset  . Hypertension Mother   . Heart disease  Father     Prior to Admission medications   Medication Sig Start Date End Date Taking? Authorizing Provider  aspirin 81 MG tablet Take 81 mg by mouth every evening.    Yes Historical Provider, MD  azelastine (ASTELIN) 137 MCG/SPRAY nasal spray Place 1 spray into the nose 2 (two) times daily. Use in each nostril as directed 08/01/12  Yes Ronnald Nian, MD  CALCIUM CARBONATE-VIT D-MIN PO Take 1 tablet by mouth daily.   Yes Historical Provider, MD  CRANBERRY EXTRACT PO Take 1 capsule by mouth daily.    Yes Historical Provider, MD  glucosamine-chondroitin 500-400 MG tablet Take 1 tablet by mouth 3 (three) times daily.     Yes Historical Provider, MD  hydrochlorothiazide (MICROZIDE) 12.5 MG capsule Take 12.5 mg by mouth daily.   Yes Historical Provider, MD  loratadine (CLARITIN) 10 MG tablet Take 5 mg by mouth daily as needed for allergies.   Yes Historical Provider, MD  Polyethyl Glycol-Propyl Glycol (SYSTANE OP) Place 1 drop into both eyes daily as needed (for dry eyes).   Yes Historical Provider, MD   Physical Exam: Filed Vitals:   08/26/12 1046 08/26/12 1300 08/26/12 1304 08/26/12 1315  BP: 185/85 130/61 130/61   Pulse: 89 75    Temp: 97.4 F (36.3 C)   98.1 F (36.7 C)  TempSrc: Oral     Resp: 20 14 14    SpO2: 95% 95% 96%    Constitutional: She is oriented to person, place, and time. She appears well-developed and well-nourished. Non-toxic appearance. She  does not have a sickly appearance. No distress.  HENT:  Head: Normocephalic and atraumatic.  Eyes: Conjunctivae and EOM are normal. No scleral icterus.  Neck: Normal range of motion. Neck supple. No JVD present.  Cardiovascular: Normal rate, regular rhythm and intact distal pulses.  Pulmonary/Chest: Effort normal. No respiratory distress. She has no wheezes.  Abdominal: Soft. She exhibits no distension. There is no tenderness.  Neurological: She is alert and oriented to person, place, and time. No cranial nerve deficit. She  exhibits normal muscle tone. Coordination normal.  No arm drift, no leg drift. 5 over 5 distal strength in both upper extremities, lower extremities. Finger to nose test bilaterally were intact. No facial droop.  Skin: Skin is warm and dry. She is not diaphoretic.     Labs on Admission:  Basic Metabolic Panel:  Recent Labs Lab 08/26/12 1126 08/26/12 1145  NA 140 141  K 3.5 3.7  CL 106 100  CO2  --  25  GLUCOSE 91 92  BUN 23 23  CREATININE 0.80 0.82  CALCIUM  --  10.0   Liver Function Tests:  Recent Labs Lab 08/26/12 1145  AST 24  ALT 18  ALKPHOS 58  BILITOT 0.2*  PROT 7.6  ALBUMIN 3.9   CBC:  Recent Labs Lab 08/26/12 1126 08/26/12 1145  WBC  --  9.0  NEUTROABS  --  3.9  HGB 15.6* 15.3*  HCT 46.0 44.8  MCV  --  87.2  PLT  --  296   Cardiac Enzymes:  Recent Labs Lab 08/26/12 1100  TROPONINI <0.30    Radiological Exams on Admission: Ct Head Wo Contrast  08/26/2012  *RADIOLOGY REPORT*  Clinical Data: Slurred speech, headache, aphasia  CT HEAD WITHOUT CONTRAST  Technique:  Contiguous axial images were obtained from the base of the skull through the vertex without contrast.  Comparison: None.  Findings: Mild new cortical volume loss noted with proportional ventricular prominence. No acute hemorrhage, acute infarction, or mass lesion is seen.  No midline shift.  No acute osseous finding.  IMPRESSION: No acute intracranial abnormality.   Original Report Authenticated By: Christiana Pellant, M.D.     EKG:  -no acute ischemic abnormalities. RRR, none-specific T wave depression in V5-a nd V6.  Assessment/Plan 1-TIA (transient ischemic attack): while taking ASA. -will admit to telemetry -TIA work up (2-d echo, carotid dopplers, MRI) -patient with risk factors for stroke. -will change ASA to plavix -depending results will consult neurology if needed. -will check TSH  2-Hypertension: continue HCTZ and low sodium diet. Patient in setting ox ischemic process, will  be permissive with HTN in first 24 hours.  3-Hyperlipidemia LDL goal < 100: will start zocor  4-H/O chronic cystitis: currently no dysuria and UA no suggesting infection. Normal WBC's and no fever.  5-Allergic rhinitis: continue nasal spray and also claritin  ZOX:[id number].    Code Status: full Family Communication: husband at bedside Disposition Plan: Observation, TIA, telemetry; LOS < 2 midnights  Time spent: >30 minutes  Luciano Cinquemani [facility] Pager [phone]  If 7PM-7AM, please contact night-coverage [email or web address] Password [email or web address] 08/26/2012, 2:40 PM

## 2012-08-27 DIAGNOSIS — N390 Urinary tract infection, site not specified: Secondary | ICD-10-CM | POA: Diagnosis present

## 2012-08-27 DIAGNOSIS — I369 Nonrheumatic tricuspid valve disorder, unspecified: Secondary | ICD-10-CM

## 2012-08-27 DIAGNOSIS — G459 Transient cerebral ischemic attack, unspecified: Secondary | ICD-10-CM

## 2012-08-27 LAB — LIPID PANEL
Cholesterol: 187 mg/dL (ref 0–200)
Total CHOL/HDL Ratio: 4.6 RATIO
Triglycerides: 214 mg/dL — ABNORMAL HIGH (ref <150)
VLDL: 43 mg/dL — ABNORMAL HIGH (ref 0–40)

## 2012-08-27 LAB — RAPID URINE DRUG SCREEN, HOSP PERFORMED
Cocaine: NOT DETECTED
Opiates: NOT DETECTED

## 2012-08-27 MED ORDER — CLOPIDOGREL BISULFATE 75 MG PO TABS: 75.0000 mg | ORAL_TABLET | Freq: Every day | ORAL | Status: DC

## 2012-08-27 MED ORDER — DEXTROSE 5 % IV SOLN
1.0000 g | INTRAVENOUS | Status: DC
  Administered 2012-08-27: 1 g via INTRAVENOUS
  Filled 2012-08-27 (×2): qty 10

## 2012-08-27 MED ORDER — PRAVASTATIN SODIUM 40 MG PO TABS: 40.0000 mg | ORAL_TABLET | Freq: Every day | ORAL | Status: DC

## 2012-08-27 NOTE — Progress Notes (Signed)
Pt received D/C information and education and accepted it well. Pt was given a copy of all D/C instructions to take home. IV was d/c and skin/site was intact. No concerns.

## 2012-08-27 NOTE — Progress Notes (Signed)
Utilization Review Completed.[name] T5/02/2013  

## 2012-08-27 NOTE — Progress Notes (Signed)
  Echocardiogram 2D Echocardiogram has been performed.  Joyice Magda 08/27/2012, 3:44 PM 

## 2012-08-27 NOTE — Progress Notes (Signed)
VASCULAR LAB PRELIMINARY  PRELIMINARY  PRELIMINARY  PRELIMINARY  Carotid duplex  completed.    Preliminary report:  Bilateral:  No evidence of hemodynamically significant internal carotid artery stenosis.   Vertebral artery flow is antegrade.      Brynn Reznik, RVT 08/27/2012, 11:39 AM

## 2012-08-27 NOTE — Discharge Summary (Signed)
Physician Discharge Summary  ALLEEN KEHM ZOX:[id number] DOB: Oct 04, 1930 DOA: 08/26/2012  PCP: Carollee Herter, MD  Admit date: 08/26/2012 Discharge date: 08/27/2012  Time spent: >30 minutes  Recommendations for Outpatient Follow-up:  1. Check lipid panel and LFT's in 3 months 2. Follow 2-D echo  Discharge Diagnoses:  Principal Problem:   TIA (transient ischemic attack) Active Problems:   Hypertension   Hyperlipidemia LDL goal < 130   H/O chronic cystitis   UTI (urinary tract infection)   Discharge Condition: stable and improved. Will follow with PCP in 2 weeks and will be discharge home.  Diet recommendation: heart healthy  Filed Weights   08/27/12 0503  Weight: 72.122 kg (159 lb)    History of present illness:  79 y.o. female with pmh significant for HTN, dyslipidemia, allergic rhinitis and chronic cystitis; came to the hospital after experiencing HA and dysarthria. Patient got up this morning at 5:30 to use the bathroom, feeling normal except for mild headache affecting her left side and mild dizziness. This resolved. Then she endorses some difficulty speaking to her husband (expressing what she wants to say). Also her husband reports mild dysarthria. This occurred 3 times in total over the course of morning PTA; each episode lasted about 1 minute. Currently reports resolution of symptoms. Patient and husband denies any facial droop, visual changes, focal numbness or weakness of her arms or legs. No gait abnormality. She took 2 baby ASA prior to admission. Denies neck pain, chest pain, SOB, N/V, fever, chills, dysuria or any other complaints.   Hospital Course:  1-TIA (transient ischemic attack): while taking ASA. Symptoms and risk factors suspicious for TIA. -work up essentially negative -2-D echo done but pending at discharge, to be follow by PCP -will change ASA to plavix  -TSH WNL  2-Hypertension: continue HCTZ and low sodium diet. Patient BP stable throughout  hospitalization.  3-Hyperlipidemia LDL goal < 100: will start tx with pravachol and will recommend low fat diet  4-H/O chronic cystitis: currently no dysuria and UA no suggesting infection (neg nitrites, and just 7-10 WBC's, clear appearance). Patient also with normal WBC's and no fever.   5-Allergic rhinitis: continue nasal spray and also claritin  *Rest of medical problems remains stable and the plan is to continue current medication regimen.  Procedures: 2-D echo done but pending at discharge (patient PCP to follow results) Consultations:  none  Discharge Exam: Filed Vitals:   08/26/12 2049 08/27/12 0157 08/27/12 0503 08/27/12 1314  BP: 127/52 103/47 119/65 120/61  Pulse: 64 66 66 82  Temp: 98.2 F (36.8 C) 98 F (36.7 C) 98.1 F (36.7 C) 98.4 F (36.9 C)  TempSrc: Oral Oral Oral Oral  Resp: 20 20 20 18   Height:   5\' 5"  (1.651 m)   Weight:   72.122 kg (159 lb)   SpO2: 95% 96% 93% 94%    General: NAD, no fever, no motor or sensory deficit. AAOX3 Cardiovascular: S1 and S2, no rubs, no gallops Respiratory: CTA bilaterally Abdomen: soft, NT, ND, positive BS Extremities: no edema, cyanosis or clubbing Neuro: non focal deficit; CN intact, good insight in conversation, MS 5/5 bilaterally; denies HA's  Discharge Instructions  Discharge Orders   Future Orders Complete By Expires     Diet - low sodium heart healthy  As directed     Discharge instructions  As directed     Comments:      Follow with PCP in 2 weeks Take medications as prescribed Follow low fat diet  Keep yourself well hydrated        Medication List    STOP taking these medications       aspirin 81 MG tablet      TAKE these medications       azelastine 137 MCG/SPRAY nasal spray  Commonly known as:  ASTELIN  Place 1 spray into the nose 2 (two) times daily. Use in each nostril as directed     CALCIUM CARBONATE-VIT D-MIN PO  Take 1 tablet by mouth daily.     clopidogrel 75 MG tablet  Commonly  known as:  PLAVIX  Take 1 tablet (75 mg total) by mouth daily with breakfast.     CRANBERRY EXTRACT PO  Take 1 capsule by mouth daily.     glucosamine-chondroitin 500-400 MG tablet  Take 1 tablet by mouth 3 (three) times daily.     hydrochlorothiazide 12.5 MG capsule  Commonly known as:  MICROZIDE  Take 12.5 mg by mouth daily.     loratadine 10 MG tablet  Commonly known as:  CLARITIN  Take 5 mg by mouth daily as needed for allergies.     pravastatin 40 MG tablet  Commonly known as:  PRAVACHOL  Take 1 tablet (40 mg total) by mouth daily.     SYSTANE OP  Place 1 drop into both eyes daily as needed (for dry eyes).       No Known Allergies     Follow-up Information   Follow up with Carollee Herter, MD. Schedule an appointment as soon as possible for a visit in 2 weeks.   Contact information:   [address] [place] [address] [phone]        The results of significant diagnostics from this hospitalization (including imaging, microbiology, ancillary and laboratory) are listed below for reference.    Significant Diagnostic Studies: Ct Head Wo Contrast  08/26/2012  *RADIOLOGY REPORT*  Clinical Data: Slurred speech, headache, aphasia  CT HEAD WITHOUT CONTRAST  Technique:  Contiguous axial images were obtained from the base of the skull through the vertex without contrast.  Comparison: None.  Findings: Mild new cortical volume loss noted with proportional ventricular prominence. No acute hemorrhage, acute infarction, or mass lesion is seen.  No midline shift.  No acute osseous finding.  IMPRESSION: No acute intracranial abnormality.   Original Report Authenticated By: Christiana Pellant, M.D.    Mri Brain Without Contrast  08/26/2012  *RADIOLOGY REPORT*  Clinical Data:  Stroke.  Slurred speech.  Headache and a aphasia.  MRI HEAD WITHOUT CONTRAST MRA HEAD WITHOUT CONTRAST  Technique:  Multiplanar, multiecho pulse sequences of the brain and surrounding structures  were obtained without intravenous contrast. Angiographic images of the head were obtained using MRA technique without contrast.  Comparison:  CT head without contrast from the same day.  MRI HEAD  Findings:  No acute cortical infarct, hemorrhage, or mass lesion is present.  Atrophy and minimal white matter disease is within normal limits for age.  Flow is present in the major intracranial arteries.  The patient is status post left lens extraction.  The globes and orbits are otherwise intact.  Mild mucosal thickening is noted within the anterior ethmoid air cells.  The mastoid air cells are clear.  IMPRESSION: Normal MRI brain for age.  MRA HEAD  Findings: The internal carotid arteries are within normal limits from high cervical segments through the ICA termini bilaterally. The left A1 segment is dominant.  The anterior communicating artery is patent.  The M1  segments are normal bilaterally.  There is segmental narrowing of distal ACA and MCA branch vessels.  The left vertebral artery is slightly down to the right.  The PICA origins are not visualized.  The basilar artery is within normal limits.  Both posterior cerebral arteries originate from basilar tip.  Moderate narrowing is present in the distal right P2 segment. There is some narrowing of distal PCA branch vessels as well.  IMPRESSION:  1.  Mild to moderate distal small vessel disease. 2.  Moderate stenosis of the distal right P2 segment.   Original Report Authenticated By: Marin Roberts, M.D.    Mr Mra Head/brain Wo Cm  08/26/2012  *RADIOLOGY REPORT*  Clinical Data:  Stroke.  Slurred speech.  Headache and a aphasia.  MRI HEAD WITHOUT CONTRAST MRA HEAD WITHOUT CONTRAST  Technique:  Multiplanar, multiecho pulse sequences of the brain and surrounding structures were obtained without intravenous contrast. Angiographic images of the head were obtained using MRA technique without contrast.  Comparison:  CT head without contrast from the same day.  MRI HEAD   Findings:  No acute cortical infarct, hemorrhage, or mass lesion is present.  Atrophy and minimal white matter disease is within normal limits for age.  Flow is present in the major intracranial arteries.  The patient is status post left lens extraction.  The globes and orbits are otherwise intact.  Mild mucosal thickening is noted within the anterior ethmoid air cells.  The mastoid air cells are clear.  IMPRESSION: Normal MRI brain for age.  MRA HEAD  Findings: The internal carotid arteries are within normal limits from high cervical segments through the ICA termini bilaterally. The left A1 segment is dominant.  The anterior communicating artery is patent.  The M1 segments are normal bilaterally.  There is segmental narrowing of distal ACA and MCA branch vessels.  The left vertebral artery is slightly down to the right.  The PICA origins are not visualized.  The basilar artery is within normal limits.  Both posterior cerebral arteries originate from basilar tip.  Moderate narrowing is present in the distal right P2 segment. There is some narrowing of distal PCA branch vessels as well.  IMPRESSION:  1.  Mild to moderate distal small vessel disease. 2.  Moderate stenosis of the distal right P2 segment.   Original Report Authenticated By: Marin Roberts, M.D.     Labs: Basic Metabolic Panel:  Recent Labs Lab 08/26/12 1126 08/26/12 1145 08/26/12 1454  NA 140 141  --   K 3.5 3.7  --   CL 106 100  --   CO2  --  25  --   GLUCOSE 91 92  --   BUN 23 23  --   CREATININE 0.80 0.82 0.85  CALCIUM  --  10.0  --    Liver Function Tests:  Recent Labs Lab 08/26/12 1145  AST 24  ALT 18  ALKPHOS 58  BILITOT 0.2*  PROT 7.6  ALBUMIN 3.9   CBC:  Recent Labs Lab 08/26/12 1126 08/26/12 1145 08/26/12 1454  WBC  --  9.0 8.9  NEUTROABS  --  3.9  --   HGB 15.6* 15.3* 14.9  HCT 46.0 44.8 43.4  MCV  --  87.2 86.8  PLT  --  296 307   Cardiac Enzymes:  Recent Labs Lab 08/26/12 1100  08/26/12 1513 08/26/12 2002 08/27/12 0500  TROPONINI <0.30 <0.30 <0.30 <0.30    Signed:  Devaughn Savant  [facility] 08/27/2012, 1:30 PM

## 2012-08-28 LAB — URINE CULTURE: Colony Count: 75000

## 2012-09-08 ENCOUNTER — Ambulatory Visit (INDEPENDENT_AMBULATORY_CARE_PROVIDER_SITE_OTHER): Payer: Medicare Other | Admitting: Family Medicine

## 2012-09-08 ENCOUNTER — Encounter: Payer: Self-pay | Admitting: Family Medicine

## 2012-09-08 VITALS — BP 150/90 | HR 84 | Wt 158.0 lb

## 2012-09-08 DIAGNOSIS — I10 Essential (primary) hypertension: Secondary | ICD-10-CM

## 2012-09-08 DIAGNOSIS — G459 Transient cerebral ischemic attack, unspecified: Secondary | ICD-10-CM

## 2012-09-08 DIAGNOSIS — Z79899 Other long term (current) drug therapy: Secondary | ICD-10-CM

## 2012-09-08 MED ORDER — CLOPIDOGREL BISULFATE 75 MG PO TABS: 75.0000 mg | ORAL_TABLET | Freq: Every day | ORAL | Status: DC

## 2012-09-08 NOTE — Progress Notes (Signed)
  Subjective:    Patient ID: [name], female    DOB: 1930/05/28, 79 y.o.   MRN: [id number]  HPI She is here for followup after recent hospitalization for TIA. The workup was essentially negative however the echocardiogram was not read before she left. She presently is taking medications listed in the chart including Plavix. She's had no difficulty with weakness, speech or motor function since then. She is slightly apprehensive concerning all this.   Review of Systems     Objective:   Physical Exam Alert and in no distress. Cardiac exam shows regular rhythm without murmurs or gallops. Moves all extremities without difficulty. Case was discussed with Dr. [place] who review the echocardiogram he recommended continuing with Plavix and aspirin.       Assessment & Plan:  Hypertension  TIA (transient ischemic attack) - Plan: clopidogrel (PLAVIX) 75 MG tablet  Encounter for long-term (current) use of other medications she is to follow up here as needed. Discussed neurology referral but did not feel it was appropriate at this time. She is comfortable with that.

## 2012-09-08 NOTE — Patient Instructions (Signed)
Continue Plavix and one baby aspirin.

## 2012-09-29 ENCOUNTER — Encounter: Payer: Self-pay | Admitting: Family Medicine

## 2012-09-29 ENCOUNTER — Ambulatory Visit (INDEPENDENT_AMBULATORY_CARE_PROVIDER_SITE_OTHER): Payer: Medicare Other | Admitting: Family Medicine

## 2012-09-29 VITALS — BP 130/80 | HR 84 | Temp 98.2°F | Ht 66.0 in | Wt 159.0 lb

## 2012-09-29 DIAGNOSIS — R5383 Other fatigue: Secondary | ICD-10-CM

## 2012-09-29 DIAGNOSIS — E785 Hyperlipidemia, unspecified: Secondary | ICD-10-CM

## 2012-09-29 DIAGNOSIS — I10 Essential (primary) hypertension: Secondary | ICD-10-CM | POA: Insufficient documentation

## 2012-09-29 DIAGNOSIS — R002 Palpitations: Secondary | ICD-10-CM

## 2012-09-29 DIAGNOSIS — R42 Dizziness and giddiness: Secondary | ICD-10-CM

## 2012-09-29 DIAGNOSIS — G459 Transient cerebral ischemic attack, unspecified: Secondary | ICD-10-CM

## 2012-09-29 LAB — CBC WITH DIFFERENTIAL/PLATELET
Eosinophils Absolute: 0.2 10*3/uL (ref 0.0–0.7)
HCT: 44.1 % (ref 36.0–46.0)
Hemoglobin: 14.9 g/dL (ref 12.0–15.0)
Lymphs Abs: 3.2 10*3/uL (ref 0.7–4.0)
MCH: 28.8 pg (ref 26.0–34.0)
MCV: 85.1 fL (ref 78.0–100.0)
Monocytes Absolute: 0.8 10*3/uL (ref 0.1–1.0)
Monocytes Relative: 8 % (ref 3–12)
Neutrophils Relative %: 56 % (ref 43–77)
RBC: 5.18 MIL/uL — ABNORMAL HIGH (ref 3.87–5.11)

## 2012-09-29 LAB — BASIC METABOLIC PANEL
CO2: 27 mEq/L (ref 19–32)
Calcium: 10.2 mg/dL (ref 8.4–10.5)
Glucose, Bld: 97 mg/dL (ref 70–99)
Sodium: 140 mEq/L (ref 135–145)

## 2012-09-29 NOTE — Progress Notes (Signed)
Chief Complaint  Patient presents with  . Dizziness    and SOB, heartracing. Has been placed on 2 new meds plavix and pravachol May 10,2014.    She is complaining of her heart racing.  Seems to occur with any activity.  She checked her pulse this morning when she had symptoms, and it was 88-90 (and BP was 120's/70).  She noticed this symptom only since starting the new medication for cholesterol, pravastatin, started when she had her TIA last month.  She also feels lightheaded in the mornings, and any time that she is active.  She has been walking 20-30 minutes/day, but now it is much harder to do than prior to being on the meds.  No associated chest pain.  Sometimes has a slight tightness in chest, and some shortness of breath.  Seems better after she gets home and rests for a bit.  Hasn't gotten close to passing out, but that is because she is scared to push it, and so rests/sits down.  She was admitted to hospital last month with TIA.  She had MRI and echo--see reports.  She was started on aspirin, plavix and pravastatin.  No further problems with any neurologic complaints.  Past Medical History  Diagnosis Date  . Candidiasis of vulva and vagina   . Other chronic cystitis   . Cystocele, midline   . Disorder of bone and cartilage, unspecified   . Other sign and symptom in breast   . Rectal incontinence   . Fibroids   . Hypertension    Past Surgical History  Procedure Laterality Date  . Hysteroscopy w/d&c  03/16/1999   History   Social History  . Marital Status: Married    Spouse Name: N/A    Number of Children: N/A  . Years of Education: N/A   Occupational History  . Not on file.   Social History Main Topics  . Smoking status: Never Smoker   . Smokeless tobacco: Not on file  . Alcohol Use: Yes     Comment: 1-2 glasses of wine/week  . Drug Use: No  . Sexually Active: Not Currently   Other Topics Concern  . Not on file   Social History Narrative  . No narrative on file    Current outpatient prescriptions:aspirin 81 MG tablet, Take 81 mg by mouth daily., Disp: , Rfl: ;  azelastine (ASTELIN) 137 MCG/SPRAY nasal spray, Place 1 spray into the nose 2 (two) times daily. Use in each nostril as directed, Disp: 30 mL, Rfl: 11;  CALCIUM CARBONATE-VIT D-MIN PO, Take 1 tablet by mouth daily., Disp: , Rfl: ;  clopidogrel (PLAVIX) 75 MG tablet, Take 1 tablet (75 mg total) by mouth daily with breakfast., Disp: 30 tablet, Rfl: 11 CRANBERRY EXTRACT PO, Take 1 capsule by mouth daily. , Disp: , Rfl: ;  glucosamine-chondroitin 500-400 MG tablet, Take 1 tablet by mouth 3 (three) times daily.  , Disp: , Rfl: ;  hydrochlorothiazide (MICROZIDE) 12.5 MG capsule, Take 12.5 mg by mouth daily., Disp: , Rfl: ;  Polyethyl Glycol-Propyl Glycol (SYSTANE OP), Place 1 drop into both eyes daily as needed (for dry eyes)., Disp: , Rfl:  pravastatin (PRAVACHOL) 40 MG tablet, Take 1 tablet (40 mg total) by mouth daily., Disp: 30 tablet, Rfl: 1;  loratadine (CLARITIN) 10 MG tablet, Take 5 mg by mouth daily as needed for allergies., Disp: , Rfl:   Allergies  Allergen Reactions  . Lisinopril Other (See Comments)    DIZZY   ROS:  Denies  fevers, URI symptoms, allergies, nausea, vomiting, bowel changes, urinary complaints, joint pains, rashes, bleeding/bruising, headaches.  See HPI  PHYSICAL EXAM: BP 170/60  Pulse 84  Temp(Src) 98.2 F (36.8 C) (Oral)  Ht 5\' 6"  (1.676 m)  Wt 159 lb (72.122 kg)  BMI 25.68 kg/m2  SpO2 98% 164/82 on repeat by MD; rate 76.  Anxious-appearing female, in no distress HEENT:  PERRL, EOMI, conjunctiva clear Neck: no lymphadenopathy or thyromegaly Heart: regular rate and rhythm, with occasional skipped beat. Lungs: clear bilaterally Abdomen: soft, nontender, no organomegaly or mass Extremities: no edema Skin: no rashes or bruising Psych: anxious; normal speech, eye contact, hygiene and grooming Neuro: alert and oriented.  CN intact. Normal gait, strength  EKG:  NSR;  possible septal infarct/poor RWP.  Review of chart shows EKG is unchanged from that done in ED last month.  ASSESSMENT/PLAN:  Palpitations - Plan: CBC with Differential, Basic metabolic panel, EKG 12-Lead  Other malaise and fatigue - Plan: CBC with Differential, EKG 12-Lead  Dizziness and giddiness - Plan: CBC with Differential, EKG 12-Lead  Essential hypertension, benign - white coat component; normal BP's at home, and pt states her monitor has been verified as accurate - Plan: EKG 12-Lead  Hyperlipidemia LDL goal < 130 - Goal should be LDL<100 given TIA.  LDL in hospital was 103, prior to being on statin, so she should easily be at goal. doubt SE of med  TIA (transient ischemic attack)  ddx discussed, including angina, arrhythmia (at risk for afib with dilated atria, but no tachycardia), anxiety. Check EKG, labs.  May need to f/u with Dr. [place] for further evaluation (Holter vs event monitor).  ? Need for additional eval (ie TEE) given echo recommendations.  Echo results were reviewed--some aortic pathology and dilated atrium.  Last lipids reviewed--LDL 103 in hospital, prior to that LDL had been running in 120's.  Doubt that her complaints are related to pravastatin.  If no other pathology found on labs (and/or cardiac eval), then likely her admitted anxiety is contributing.  Consider treatment if persists/worsens

## 2012-09-29 NOTE — Patient Instructions (Addendum)
Drink plenty of fluids. We will be in touch with your lab results. We may need you to follow up with Dr. Corena Herter will be in touch next week.  Continue to monitor your blood pressure and pulse, and whether or not the pulse feels irregular.

## 2012-10-02 ENCOUNTER — Other Ambulatory Visit: Payer: Self-pay | Admitting: *Deleted

## 2012-10-02 DIAGNOSIS — R002 Palpitations: Secondary | ICD-10-CM

## 2012-10-04 ENCOUNTER — Telehealth: Payer: Self-pay | Admitting: Cardiology

## 2012-10-04 NOTE — Telephone Encounter (Signed)
New Problem  Veronica @ Dr Lynelle Doctor office wants to know if we can go ahead an put an event monitor on this patient until she gets in with Dr Sw[place] in August. She asked if you could call her back.

## 2012-10-04 NOTE — Telephone Encounter (Signed)
Returned call to [place] at Dr Delford Field office she stated patient requested to see Dr.Jordan and the next appointment 12/07/12.Stated patient is having palpitations,dizziness,tia's.Will check Dr.Jordan's schedule and call her back with a sooner appointment.

## 2012-10-11 NOTE — Telephone Encounter (Signed)
Returned call to [place] at [facility] office left message on personal voice mail patient scheduled appointment with Dr.Jordan 10/25/12 at 9:45 am patient is aware.

## 2012-10-16 ENCOUNTER — Other Ambulatory Visit (HOSPITAL_COMMUNITY): Payer: Self-pay | Admitting: Family Medicine

## 2012-10-25 ENCOUNTER — Telehealth: Payer: Self-pay | Admitting: Cardiology

## 2012-10-25 ENCOUNTER — Ambulatory Visit (INDEPENDENT_AMBULATORY_CARE_PROVIDER_SITE_OTHER): Payer: Medicare Other | Admitting: Cardiology

## 2012-10-25 ENCOUNTER — Encounter: Payer: Self-pay | Admitting: Cardiology

## 2012-10-25 VITALS — BP 160/80 | HR 76 | Ht 66.0 in | Wt 161.0 lb

## 2012-10-25 DIAGNOSIS — R002 Palpitations: Secondary | ICD-10-CM

## 2012-10-25 DIAGNOSIS — G459 Transient cerebral ischemic attack, unspecified: Secondary | ICD-10-CM

## 2012-10-25 DIAGNOSIS — I700 Atherosclerosis of aorta: Secondary | ICD-10-CM | POA: Insufficient documentation

## 2012-10-25 DIAGNOSIS — I10 Essential (primary) hypertension: Secondary | ICD-10-CM

## 2012-10-25 NOTE — Progress Notes (Signed)
[name] Date of Birth: 1930-10-04 Medical Record #[id number]  History of Present Illness: [name] is seen at the request of Dr. Susann Givens for evaluation of palpitations and recent TIA. She was admitted to the hospital in May of this year with a TIA. She presented with aphasia and garbled speech. MRI/MRA and CT of the head were unremarkable. Carotid Dopplers were normal. She was treated with antiplatelet therapy with aspirin and Plavix. Since discharge she has had complaints of shortness of breath and has felt a little bit dizzy. She has had episodes of rapid palpitations. These are intermittent and do not occur daily. She reports her blood pressure control at home has been good. She's had no recurrent neurologic events. She denies any chest pain and has been exercising regularly.  Current Outpatient Prescriptions on File Prior to Visit  Medication Sig Dispense Refill  . aspirin 81 MG tablet Take 81 mg by mouth daily.      Marland Kitchen azelastine (ASTELIN) 137 MCG/SPRAY nasal spray Place 1 spray into the nose 2 (two) times daily. Use in each nostril as directed  30 mL  11  . CALCIUM CARBONATE-VIT D-MIN PO Take 1 tablet by mouth daily.      . clopidogrel (PLAVIX) 75 MG tablet Take 1 tablet (75 mg total) by mouth daily with breakfast.  30 tablet  11  . CRANBERRY EXTRACT PO Take 1 capsule by mouth daily.       Marland Kitchen glucosamine-chondroitin 500-400 MG tablet Take 1 tablet by mouth 3 (three) times daily.        . hydrochlorothiazide (MICROZIDE) 12.5 MG capsule Take 12.5 mg by mouth daily.      Marland Kitchen loratadine (CLARITIN) 10 MG tablet Take 5 mg by mouth daily as needed for allergies.      Bertram Gala Glycol-Propyl Glycol (SYSTANE OP) Place 1 drop into both eyes daily as needed (for dry eyes).      . pravastatin (PRAVACHOL) 40 MG tablet take 1 tablet by mouth once daily  30 tablet  1   No current facility-administered medications on file prior to visit.    Allergies  Allergen Reactions  . Lisinopril Other (See  Comments)    DIZZY    Past Medical History  Diagnosis Date  . Candidiasis of vulva and vagina   . Other chronic cystitis   . Cystocele, midline   . Disorder of bone and cartilage, unspecified   . Other sign and symptom in breast   . Rectal incontinence   . Fibroids   . Hypertension   . TIA (transient ischemic attack)     Past Surgical History  Procedure Laterality Date  . Hysteroscopy w/d&c  03/16/1999    History  Smoking status  . Never Smoker   Smokeless tobacco  . Not on file    History  Alcohol Use  . Yes    Comment: 1-2 glasses of wine/week    Family History  Problem Relation Age of Onset  . Hypertension Mother   . Heart disease Father     Review of Systems: The review of systems is positive for remote history of migraines.  She was intolerant of lisinopril due to to significant dizziness. All other systems were reviewed and are negative.  Physical Exam: BP 160/80  Pulse 76  Ht 5\' 6"  (1.676 m)  Wt 161 lb (73.029 kg)  BMI 26 kg/m2 She is a pleasant white female in no acute distress. HEENT: Normocephalic, atraumatic. Pupils are equal round and reactive  to light and accommodation. Sclera clear. Oropharynx is clear. Neck is supple no jugular venous distention, adenopathy, thyromegaly, or bruits. Lungs: Clear Cardiovascular: Regular rate and rhythm, normal S1 and S2, no gallop or murmur. Abdomen: Soft and nontender. No masses or hepatosplenomegaly. No bruits. Extremities: No cyanosis or edema. Pulses are 2+ and symmetric. No phlebitis. Skin: Warm and dry Neuro: Alert and oriented x3. Cranial nerves II through XII are intact. No focal motor or sensory deficits.  LABORATORY DATA: Lab Results  Component Value Date   WBC 9.6 09/29/2012   HGB 14.9 09/29/2012   HCT 44.1 09/29/2012   PLT 339 09/29/2012   GLUCOSE 97 09/29/2012   CHOL 187 08/27/2012   TRIG 214* 08/27/2012   HDL 41 08/27/2012   LDLCALC 103* 08/27/2012   ALT 18 08/26/2012   AST 24 08/26/2012   NA  140 09/29/2012   K 4.0 09/29/2012   CL 103 09/29/2012   CREATININE 0.86 09/29/2012   BUN 24* 09/29/2012   CO2 27 09/29/2012   TSH 3.241 08/26/2012   INR 0.96 08/26/2012   HGBA1C 5.8* 08/26/2012   ECG 09/29/2012 demonstrates normal sinus rhythm with a normal ECG.  Transthoracic Echocardiography  Patient: Katrina, Spencer MR #: [id number] Study Date: 08/27/2012 Gender: F Age: 79 Height: 165.1cm Weight: 72.1kg BSA: 1.54m^2 Pt. Status: Room: 4N31C  PERFORMING [place], [facility] ADMITTING [place], Mikle Bosworth ATTENDING [place], Mikle Bosworth ORDERING [place], Carlos REFERRING [place], Mikle Bosworth SONOGRAPHER [place], [place] cc:  ------------------------------------------------------------ LV EF: 55% - 60%  ------------------------------------------------------------ Indications: TIA 435.9.  ------------------------------------------------------------ History: PMH: Transient ischemic attack. Risk factors: H/o chronic cystitis. UTI. Osteopenia. Hypertension. Dyslipidemia.  ------------------------------------------------------------ Study Conclusions  - Left ventricle: The cavity size was normal. There was mild concentric hypertrophy. Systolic function was normal. The estimated ejection fraction was in the range of 55% to 60%. Wall motion was normal; there were no regional wall motion abnormalities. - Mitral valve: Calcified annulus. - Left atrium: The atrium was mildly to moderately dilated. - Right ventricle: The cavity size was normal. Wall thickness was mildly increased. - Right atrium: The atrium was mildly dilated. - Atrial septum: No defect or patent foramen ovale was identified. Impressions:  - Aortic pathology represents a possible source of embolism. Further assessment with TEE, MRI or CT angio should be considered. Transthoracic echocardiography. M-mode, complete 2D, spectral Doppler, and color Doppler. Height: Height: 165.1cm. Height: 65in. Weight: Weight: 72.1kg.  Weight: 158.7lb. Body mass index: BMI: 26.5kg/m^2. Body surface area: BSA: 1.44m^2. Blood pressure: 120/61. Patient status: Inpatient. Location: Bedside.  ------------------------------------------------------------  ------------------------------------------------------------ Left ventricle: The cavity size was normal. There was mild concentric hypertrophy. Systolic function was normal. The estimated ejection fraction was in the range of 55% to 60%. Wall motion was normal; there were no regional wall motion abnormalities.  ------------------------------------------------------------ Aortic valve: Structurally normal valve. Trileaflet. Cusp separation was normal. Doppler: Transvalvular velocity was within the normal range. There was no stenosis. No regurgitation.  ------------------------------------------------------------ Aorta: Aortic root: The aortic root was normal in size. Ascending aorta: The ascending aorta was mildly dilated; it had a single 3 mm mobile plaque or thrombus proximally. Aortic arch: The aortic arch was normal in size.  ------------------------------------------------------------ Mitral valve: Calcified annulus. Leaflet separation was normal. Doppler: Transvalvular velocity was within the normal range. There was no evidence for stenosis. Trivial regurgitation. Peak gradient: 4mm Hg (D).  ------------------------------------------------------------ Left atrium: The atrium was mildly to moderately dilated.  ------------------------------------------------------------ Atrial septum: No defect or patent foramen ovale was identified.  ------------------------------------------------------------ Right ventricle: The cavity  size was normal. Wall thickness was mildly increased. Systolic function was normal.  ------------------------------------------------------------ Pulmonic valve: Structurally normal valve. Cusp separation was normal. Doppler:  Transvalvular velocity was within the normal range. No regurgitation.  ------------------------------------------------------------ Tricuspid valve: Structurally normal valve. Leaflet separation was normal. Doppler: Transvalvular velocity was within the normal range. Mild regurgitation.  ------------------------------------------------------------ Pulmonary artery: The main pulmonary artery was normal-sized. Systolic pressure was within the normal range.  ------------------------------------------------------------ Right atrium: The atrium was mildly dilated.  ------------------------------------------------------------ Pericardium: There was no pericardial effusion.  ------------------------------------------------------------ Systemic veins: Inferior vena cava: The vessel was normal in size; the respirophasic diameter changes were in the normal range (= 50%); findings are consistent with normal central venous pressure.  ------------------------------------------------------------  2D measurements Normal Doppler Normal Left ventricle measurements LVID ED, 39.3 mm 43-52 Left ventricle chord, Ea, lat 8.01 cm/ ------- PLAX ann, tiss s LVID ES, 25.3 mm 23-38 DP chord, E/Ea, lat 11.71 ------- PLAX ann, tiss FS, chord, 36 % >29 DP PLAX Ea, med 6.03 cm/ ------- LVPW, ED 10.8 mm ------ ann, tiss s IVS/LVPW 1.08 <1.3 DP ratio, ED E/Ea, med 15.56 ------- Ventricular septum ann, tiss IVS, ED 11.7 mm ------ DP Aorta Mitral valve Root diam, 35 mm ------ Peak E vel 93.8 cm/ ------- ED s Left atrium Peak A vel 115 cm/ ------- AP dim 38 mm ------ s AP dim 2.12 cm/m^2 <2.2 Deceleratio 232 ms 150-230 index n time Vol, S 51 ml ------ Peak 4 mm ------- Vol index, 28.5 ml/m^2 ------ gradient, D Hg S Peak E/A 0.8 ------- ratio Systemic veins Estimated 10 mm ------- CVP Hg Right ventricle Sa vel, lat 11.3 cm/ ------- ann, tiss  s DP  ------------------------------------------------------------ Prepared and Electronically Authenticated by  Milton Bing 2014-05-11T17:11:39.627  Assessment / Plan: 1. TIA. Potential mechanisms include complex aortic atheroma (as noted on TTE) versus intermittent atrial fibrillation. She is having symptoms of tachy- palpitations. Have recommended a TEE to further define her aortic pathology. I've also recommended an event monitor to try and document intermittent episodes of atrial fibrillation. A Holter monitor would be insufficient since her symptoms are sporadic and her monitor in the hospital was unrevealing. If she does have episodes of atrial fibrillation we will need to consider anticoagulant therapy instead of antiplatelet therapy.  2. Hypertension  3. Hyperlipidemia-on pravastatin.

## 2012-10-25 NOTE — Telephone Encounter (Signed)
Returned call to patient's husband.TEE scheduled Monday 11/06/12 at 2:00 pm.Arrive at [facility] at 12:30 pm.Instruction letter will be mailed 10/25/12.

## 2012-10-25 NOTE — Patient Instructions (Signed)
We will schedule you for a transesophageal Echo to look at your aorta  We will have you wear an event monitor.  I will see you in one month.

## 2012-10-25 NOTE — Telephone Encounter (Signed)
New Prob     Calling to get a procedure scheduled for pt. Please call.

## 2012-11-03 ENCOUNTER — Ambulatory Visit (INDEPENDENT_AMBULATORY_CARE_PROVIDER_SITE_OTHER): Payer: Medicare Other

## 2012-11-03 DIAGNOSIS — G459 Transient cerebral ischemic attack, unspecified: Secondary | ICD-10-CM

## 2012-11-03 DIAGNOSIS — I700 Atherosclerosis of aorta: Secondary | ICD-10-CM

## 2012-11-03 DIAGNOSIS — R002 Palpitations: Secondary | ICD-10-CM

## 2012-11-03 DIAGNOSIS — I10 Essential (primary) hypertension: Secondary | ICD-10-CM

## 2012-11-03 NOTE — Progress Notes (Signed)
Placed a 30 day ecardio event monitor

## 2012-11-06 ENCOUNTER — Encounter (HOSPITAL_COMMUNITY): Payer: Self-pay | Admitting: *Deleted

## 2012-11-06 ENCOUNTER — Encounter (HOSPITAL_COMMUNITY)
Admission: RE | Disposition: A | Discharge status: Home / Self Care | Payer: Self-pay | Source: Ambulatory Visit | Attending: Cardiology

## 2012-11-06 ENCOUNTER — Ambulatory Visit (HOSPITAL_COMMUNITY)
Admission: RE | Admit: 2012-11-06 | Discharge: 2012-11-06 | Disposition: A | Discharge status: Home / Self Care | Payer: Medicare Other | Source: Ambulatory Visit | Attending: Cardiology | Admitting: Cardiology

## 2012-11-06 DIAGNOSIS — Z8249 Family history of ischemic heart disease and other diseases of the circulatory system: Secondary | ICD-10-CM | POA: Insufficient documentation

## 2012-11-06 DIAGNOSIS — M949 Disorder of cartilage, unspecified: Secondary | ICD-10-CM | POA: Insufficient documentation

## 2012-11-06 DIAGNOSIS — Z7902 Long term (current) use of antithrombotics/antiplatelets: Secondary | ICD-10-CM | POA: Insufficient documentation

## 2012-11-06 DIAGNOSIS — I10 Essential (primary) hypertension: Secondary | ICD-10-CM | POA: Insufficient documentation

## 2012-11-06 DIAGNOSIS — Z8673 Personal history of transient ischemic attack (TIA), and cerebral infarction without residual deficits: Secondary | ICD-10-CM | POA: Insufficient documentation

## 2012-11-06 DIAGNOSIS — I059 Rheumatic mitral valve disease, unspecified: Secondary | ICD-10-CM | POA: Insufficient documentation

## 2012-11-06 DIAGNOSIS — Z888 Allergy status to other drugs, medicaments and biological substances status: Secondary | ICD-10-CM | POA: Insufficient documentation

## 2012-11-06 DIAGNOSIS — I517 Cardiomegaly: Secondary | ICD-10-CM | POA: Insufficient documentation

## 2012-11-06 DIAGNOSIS — R002 Palpitations: Secondary | ICD-10-CM | POA: Insufficient documentation

## 2012-11-06 DIAGNOSIS — Z79899 Other long term (current) drug therapy: Secondary | ICD-10-CM | POA: Insufficient documentation

## 2012-11-06 DIAGNOSIS — M899 Disorder of bone, unspecified: Secondary | ICD-10-CM | POA: Insufficient documentation

## 2012-11-06 DIAGNOSIS — Z7982 Long term (current) use of aspirin: Secondary | ICD-10-CM | POA: Insufficient documentation

## 2012-11-06 DIAGNOSIS — I700 Atherosclerosis of aorta: Secondary | ICD-10-CM | POA: Insufficient documentation

## 2012-11-06 HISTORY — PX: TEE WITHOUT CARDIOVERSION: SHX5443

## 2012-11-06 SURGERY — ECHOCARDIOGRAM, TRANSESOPHAGEAL
Anesthesia: Moderate Sedation

## 2012-11-06 MED ORDER — FENTANYL CITRATE 0.05 MG/ML IJ SOLN
INTRAMUSCULAR | Status: DC | PRN
  Administered 2012-11-06 (×2): 25 ug via INTRAVENOUS

## 2012-11-06 MED ORDER — FENTANYL CITRATE 0.05 MG/ML IJ SOLN
INTRAMUSCULAR | Status: AC
  Filled 2012-11-06: qty 2

## 2012-11-06 MED ORDER — MIDAZOLAM HCL 5 MG/ML IJ SOLN
INTRAMUSCULAR | Status: AC
  Filled 2012-11-06: qty 2

## 2012-11-06 MED ORDER — SODIUM CHLORIDE 0.9 % IV SOLN: INTRAVENOUS | Status: DC

## 2012-11-06 MED ORDER — BUTAMBEN-TETRACAINE-BENZOCAINE 2-2-14 % EX AERO
INHALATION_SPRAY | CUTANEOUS | Status: DC | PRN
  Administered 2012-11-06: 2 via TOPICAL

## 2012-11-06 MED ORDER — MIDAZOLAM HCL 10 MG/2ML IJ SOLN
INTRAMUSCULAR | Status: DC | PRN
  Administered 2012-11-06 (×2): 2 mg via INTRAVENOUS

## 2012-11-06 NOTE — Progress Notes (Signed)
*  PRELIMINARY RESULTS* Echocardiogram TEE has been performed.  [name] 11/06/2012, 3:19 PM

## 2012-11-06 NOTE — CV Procedure (Signed)
See final TEE report in [employer]. Olga Millers

## 2012-11-06 NOTE — H&P (Signed)
[name]  10/25/2012 9:45 AM   Office Visit  MRN:  [id number]   Description: 79 year old female  Provider: [name] [place], MD  Department: [facility]        Referring Provider    Ronnald Nian, MD      Diagnoses    Essential hypertension, benign    -  Primary    401.1    TIA (transient ischemic attack)        435.9    Thoracic aorta atherosclerosis        440.0    Palpitations        785.1      Reason for Visit    Palpitations         Progress Notes    [name] [place]aziland, MD at 10/25/2012 12:06 PM    Status: Signed                      [name] Date of Birth: [birth year] Medical Record #[id number]   History of Present Illness: [name] is seen at the request of Dr. Susann Givens for evaluation of palpitations and recent TIA. She was admitted to the hospital in May of this year with a TIA. She presented with aphasia and garbled speech. MRI/MRA and CT of the head were unremarkable. Carotid Dopplers were normal. She was treated with antiplatelet therapy with aspirin and Plavix. Since discharge she has had complaints of shortness of breath and has felt a little bit dizzy. She has had episodes of rapid palpitations. These are intermittent and do not occur daily. She reports her blood pressure control at home has been good. She's had no recurrent neurologic events. She denies any chest pain and has been exercising regularly.    Current Outpatient Prescriptions on File Prior to Visit   Medication  Sig  Dispense  Refill   .  aspirin 81 MG tablet  Take 81 mg by mouth daily.         Marland Kitchen  azelastine (ASTELIN) 137 MCG/SPRAY nasal spray  Place 1 spray into the nose 2 (two) times daily. Use in each nostril as directed   30 mL   11   .  CALCIUM CARBONATE-VIT D-MIN PO  Take 1 tablet by mouth daily.         .  clopidogrel (PLAVIX) 75 MG tablet  Take 1 tablet (75 mg total) by mouth daily with breakfast.   30 tablet   11   .  CRANBERRY EXTRACT PO  Take 1 capsule by mouth  daily.          Marland Kitchen  glucosamine-chondroitin 500-400 MG tablet  Take 1 tablet by mouth 3 (three) times daily.           .  hydrochlorothiazide (MICROZIDE) 12.5 MG capsule  Take 12.5 mg by mouth daily.         Marland Kitchen  loratadine (CLARITIN) 10 MG tablet  Take 5 mg by mouth daily as needed for allergies.         Bertram Gala Glycol-Propyl Glycol (SYSTANE OP)  Place 1 drop into both eyes daily as needed (for dry eyes).         .  pravastatin (PRAVACHOL) 40 MG tablet  take 1 tablet by mouth once daily   30 tablet   1       No current facility-administered medications on file prior to visit.  Allergies   Allergen  Reactions   .  Lisinopril  Other (See Comments)       DIZZY         Past Medical History   Diagnosis  Date   .  Candidiasis of vulva and vagina     .  Other chronic cystitis     .  Cystocele, midline     .  Disorder of bone and cartilage, unspecified     .  Other sign and symptom in breast     .  Rectal incontinence     .  Fibroids     .  Hypertension     .  TIA (transient ischemic attack)           Past Surgical History   Procedure  Laterality  Date   .  Hysteroscopy w/d&c    03/16/1999         History   Smoking status   .  Never Smoker    Smokeless tobacco   .  Not on file         History   Alcohol Use   .  Yes       Comment: 1-2 glasses of wine/week         Family History   Problem  Relation  Age of Onset   .  Hypertension  Mother     .  Heart disease  Father          Review of Systems: The review of systems is positive for remote history of migraines.  She was intolerant of lisinopril due to to significant dizziness. All other systems were reviewed and are negative.   Physical Exam: BP 160/80  Pulse 76  Ht 5\' 6"  (1.676 m)  Wt 161 lb (73.029 kg)  BMI 26 kg/m2 She is a pleasant white female in no acute distress. HEENT: Normocephalic, atraumatic. Pupils are equal round and reactive to light and accommodation. Sclera clear.  Oropharynx is clear. Neck is supple no jugular venous distention, adenopathy, thyromegaly, or bruits. Lungs: Clear Cardiovascular: Regular rate and rhythm, normal S1 and S2, no gallop or murmur. Abdomen: Soft and nontender. No masses or hepatosplenomegaly. No bruits. Extremities: No cyanosis or edema. Pulses are 2+ and symmetric. No phlebitis. Skin: Warm and dry Neuro: Alert and oriented x3. Cranial nerves II through XII are intact. No focal motor or sensory deficits.   LABORATORY DATA: Lab Results   Component  Value  Date     WBC  9.6  09/29/2012     HGB  14.9  09/29/2012     HCT  44.1  09/29/2012     PLT  339  09/29/2012     GLUCOSE  97  09/29/2012     CHOL  187  08/27/2012     TRIG  214*  08/27/2012     HDL  41  08/27/2012     LDLCALC  103*  08/27/2012     ALT  18  08/26/2012     AST  24  08/26/2012     NA  140  09/29/2012     K  4.0  09/29/2012     CL  103  09/29/2012     CREATININE  0.86  09/29/2012     BUN  24*  09/29/2012     CO2  27  09/29/2012     TSH  3.241  08/26/2012     INR  0.96  08/26/2012  HGBA1C  5.8*  08/26/2012      ECG 09/29/2012 demonstrates normal sinus rhythm with a normal ECG.   Transthoracic Echocardiography  Patient: Katrina, Spencer MR #: [id number] Study Date: 08/27/2012 Gender: F Age: 74 Height: 165.1cm Weight: 72.1kg BSA: 1.103m^2 Pt. Status: Room: 4N31C  PERFORMING , [facility] ADMITTING [place], Mikle Bosworth ATTENDING [place], Mikle Bosworth ORDERING [place], Carlos REFERRING [place], Mikle Bosworth SONOGRAPHER [place], [place] cc:  ------------------------------------------------------------ LV EF: 55% - 60%  ------------------------------------------------------------ Indications: TIA 435.9.  ------------------------------------------------------------ History: PMH: Transient ischemic attack. Risk factors: H/o chronic cystitis. UTI. Osteopenia. Hypertension. Dyslipidemia.  ------------------------------------------------------------ Study  Conclusions  - Left ventricle: The cavity size was normal. There was mild concentric hypertrophy. Systolic function was normal. The estimated ejection fraction was in the range of 55% to 60%. Wall motion was normal; there were no regional wall motion abnormalities. - Mitral valve: Calcified annulus. - Left atrium: The atrium was mildly to moderately dilated. - Right ventricle: The cavity size was normal. Wall thickness was mildly increased. - Right atrium: The atrium was mildly dilated. - Atrial septum: No defect or patent foramen ovale was identified. Impressions:  - Aortic pathology represents a possible source of embolism. Further assessment with TEE, MRI or CT angio should be considered. Transthoracic echocardiography. M-mode, complete 2D, spectral Doppler, and color Doppler. Height: Height: 165.1cm. Height: 65in. Weight: Weight: 72.1kg. Weight: 158.7lb. Body mass index: BMI: 26.5kg/m^2. Body surface area: BSA: 1.41m^2. Blood pressure: 120/61. Patient status: Inpatient. Location: Bedside.  ------------------------------------------------------------  ------------------------------------------------------------ Left ventricle: The cavity size was normal. There was mild concentric hypertrophy. Systolic function was normal. The estimated ejection fraction was in the range of 55% to 60%. Wall motion was normal; there were no regional wall motion abnormalities.  ------------------------------------------------------------ Aortic valve: Structurally normal valve. Trileaflet. Cusp separation was normal. Doppler: Transvalvular velocity was within the normal range. There was no stenosis. No regurgitation.  ------------------------------------------------------------ Aorta: Aortic root: The aortic root was normal in size. Ascending aorta: The ascending aorta was mildly dilated; it had a single 3 mm mobile plaque or thrombus proximally. Aortic arch: The aortic arch was normal in  size.  ------------------------------------------------------------ Mitral valve: Calcified annulus. Leaflet separation was normal. Doppler: Transvalvular velocity was within the normal range. There was no evidence for stenosis. Trivial regurgitation. Peak gradient: 4mm Hg (D).  ------------------------------------------------------------ Left atrium: The atrium was mildly to moderately dilated.  ------------------------------------------------------------ Atrial septum: No defect or patent foramen ovale was identified.  ------------------------------------------------------------ Right ventricle: The cavity size was normal. Wall thickness was mildly increased. Systolic function was normal.  ------------------------------------------------------------ Pulmonic valve: Structurally normal valve. Cusp separation was normal. Doppler: Transvalvular velocity was within the normal range. No regurgitation.  ------------------------------------------------------------ Tricuspid valve: Structurally normal valve. Leaflet separation was normal. Doppler: Transvalvular velocity was within the normal range. Mild regurgitation.  ------------------------------------------------------------ Pulmonary artery: The main pulmonary artery was normal-sized. Systolic pressure was within the normal range.  ------------------------------------------------------------ Right atrium: The atrium was mildly dilated.  ------------------------------------------------------------ Pericardium: There was no pericardial effusion.  ------------------------------------------------------------ Systemic veins: Inferior vena cava: The vessel was normal in size; the respirophasic diameter changes were in the normal range (= 50%); findings are consistent with normal central venous pressure.  ------------------------------------------------------------  2D measurements Normal Doppler Normal Left ventricle  measurements LVID ED, 39.3 mm 43-52 Left ventricle chord, Ea, lat 8.01 cm/ ------- PLAX ann, tiss s LVID ES, 25.3 mm 23-38 DP chord, E/Ea, lat 11.71 ------- PLAX ann, tiss FS, chord, 36 % >29 DP PLAX Ea, med 6.03 cm/ ------- LVPW, ED 10.8 mm ------  ann, tiss s IVS/LVPW 1.08 <1.3 DP ratio, ED E/Ea, med 15.56 ------- Ventricular septum ann, tiss IVS, ED 11.7 mm ------ DP Aorta Mitral valve Root diam, 35 mm ------ Peak E vel 93.8 cm/ ------- ED s Left atrium Peak A vel 115 cm/ ------- AP dim 38 mm ------ s AP dim 2.12 cm/m^2 <2.2 Deceleratio 232 ms 150-230 index n time Vol, S 51 ml ------ Peak 4 mm ------- Vol index, 28.5 ml/m^2 ------ gradient, D Hg S Peak E/A 0.8 ------- ratio Systemic veins Estimated 10 mm ------- CVP Hg Right ventricle Sa vel, lat 11.3 cm/ ------- ann, tiss s DP  ------------------------------------------------------------ Prepared and Electronically Authenticated by  Wadley Bing 2014-05-11T17:11:39.627   Assessment / Plan: 1. TIA. Potential mechanisms include complex aortic atheroma (as noted on TTE) versus intermittent atrial fibrillation. She is having symptoms of tachy- palpitations. Have recommended a TEE to further define her aortic pathology. I've also recommended an event monitor to try and document intermittent episodes of atrial fibrillation. A Holter monitor would be insufficient since her symptoms are sporadic and her monitor in the hospital was unrevealing. If she does have episodes of atrial fibrillation we will need to consider anticoagulant therapy instead of antiplatelet therapy.   2. Hypertension   3. Hyperlipidemia-on pravastatin.       For TEE; no changes. Olga Millers

## 2012-11-06 NOTE — Interval H&P Note (Signed)
History and Physical Interval Note:  11/06/2012 2:12 PM  [name]  has presented today for surgery, with the diagnosis of TIA  The various methods of treatment have been discussed with the patient and family. After consideration of risks, benefits and other options for treatment, the patient has consented to  Procedure(s): TRANSESOPHAGEAL ECHOCARDIOGRAM (TEE) (N/A) as a surgical intervention .  The patient's history has been reviewed, patient examined, no change in status, stable for surgery.  I have reviewed the patient's chart and labs.  Questions were answered to the patient's satisfaction.     Olga Millers

## 2012-11-07 ENCOUNTER — Encounter (HOSPITAL_COMMUNITY): Payer: Self-pay | Admitting: Cardiology

## 2012-11-22 ENCOUNTER — Other Ambulatory Visit: Payer: Self-pay

## 2012-11-28 ENCOUNTER — Encounter: Payer: Self-pay | Admitting: Family Medicine

## 2012-11-28 ENCOUNTER — Ambulatory Visit (INDEPENDENT_AMBULATORY_CARE_PROVIDER_SITE_OTHER): Payer: Medicare Other | Admitting: Family Medicine

## 2012-11-28 VITALS — BP 140/80 | HR 88 | Wt 161.0 lb

## 2012-11-28 DIAGNOSIS — R002 Palpitations: Secondary | ICD-10-CM

## 2012-11-28 DIAGNOSIS — E785 Hyperlipidemia, unspecified: Secondary | ICD-10-CM

## 2012-11-28 MED ORDER — PRAVASTATIN SODIUM 40 MG PO TABS: ORAL_TABLET | ORAL | Status: DC

## 2012-11-28 NOTE — Progress Notes (Signed)
  Subjective:    Patient ID: [name], female    DOB: Dec 04, 1930, 79 y.o.   MRN: [id number]  HPI He is here for consultation. Recent blood work did show good lipids. She would like her Pravachol renewed. She is also in the process of being evaluated for palpitations. She has had the monitor on for over 3 weeks and apparently has had several episodes of palpitations. She is scheduled to be seen by her cardiologist in the next several days.   Review of Systems     Objective:   Physical Exam Alert and in no distress otherwise not examined       Assessment & Plan:  Palpitations  Hyperlipidemia LDL goal < 130 - Plan: pravastatin (PRAVACHOL) 40 MG tablet  Pravachol was renewed. Also recommend she call cardiology and asked him to get a read out on the monitor before she comes in for her visit in a few days.

## 2012-11-28 NOTE — Patient Instructions (Signed)
Call your cardiologist and make sure that you tell him that you want to discuss the monitor results with your next appointment

## 2012-11-30 ENCOUNTER — Encounter: Payer: Self-pay | Admitting: Cardiology

## 2012-11-30 ENCOUNTER — Ambulatory Visit (INDEPENDENT_AMBULATORY_CARE_PROVIDER_SITE_OTHER): Payer: Medicare Other | Admitting: Cardiology

## 2012-11-30 VITALS — BP 162/88 | HR 100 | Ht 66.0 in | Wt 164.8 lb

## 2012-11-30 DIAGNOSIS — E785 Hyperlipidemia, unspecified: Secondary | ICD-10-CM

## 2012-11-30 DIAGNOSIS — R002 Palpitations: Secondary | ICD-10-CM

## 2012-11-30 DIAGNOSIS — I4891 Unspecified atrial fibrillation: Secondary | ICD-10-CM

## 2012-11-30 DIAGNOSIS — G459 Transient cerebral ischemic attack, unspecified: Secondary | ICD-10-CM

## 2012-11-30 DIAGNOSIS — I10 Essential (primary) hypertension: Secondary | ICD-10-CM

## 2012-11-30 DIAGNOSIS — I480 Paroxysmal atrial fibrillation: Secondary | ICD-10-CM | POA: Insufficient documentation

## 2012-11-30 HISTORY — DX: Paroxysmal atrial fibrillation: I48.0

## 2012-11-30 MED ORDER — APIXABAN 5 MG PO TABS: 5.0000 mg | ORAL_TABLET | Freq: Two times a day (BID) | ORAL | Status: DC

## 2012-11-30 NOTE — Patient Instructions (Addendum)
Stop ASA and Plavix  Start Eliquis 5 mg twice a day.  Do not take anti-inflammatory medications like advil, aleve, or ibuprofen. Take Tylenol for pain.  I will see you in 3 months.

## 2012-11-30 NOTE — Progress Notes (Signed)
[name] Date of Birth: 10-Oct-1930 Medical Record #[id number]  History of Present Illness: [name] is seen for followup of TIA and palpitations. She has been on antiplatelet therapy with aspirin and Plavix. She complained of symptoms of palpitations and tachycardia. She reports her blood pressure readings at home have been good. She complains of some lack of stamina if she walks a long way. She still complains of palpitations. She is still wearing her event monitor and has recorded several episodes. She denies any recurrent neurologic symptoms. Since her last visit she did undergo a transesophageal echo. His neck showed no left atrial appendage clot. Valves were normal. She had some atherosclerotic disease in her aorta.  Current Outpatient Prescriptions on File Prior to Visit  Medication Sig Dispense Refill  . azelastine (ASTELIN) 137 MCG/SPRAY nasal spray Place 1 spray into the nose 2 (two) times daily. Use in each nostril as directed  30 mL  11  . CALCIUM CARBONATE-VIT D-MIN PO Take 1 tablet by mouth daily.      Marland Kitchen CRANBERRY EXTRACT PO Take 1 capsule by mouth daily.       Marland Kitchen glucosamine-chondroitin 500-400 MG tablet Take 1 tablet by mouth 3 (three) times daily.        . hydrochlorothiazide (MICROZIDE) 12.5 MG capsule Take 12.5 mg by mouth daily.      Marland Kitchen loratadine (CLARITIN) 10 MG tablet Take 5 mg by mouth daily as needed for allergies.      [facility] Gala Glycol-Propyl Glycol (SYSTANE OP) Place 1 drop into both eyes daily as needed (for dry eyes).      . pravastatin (PRAVACHOL) 40 MG tablet take 1 tablet by mouth once daily  90 tablet  3   No current facility-administered medications on file prior to visit.    Allergies  Allergen Reactions  . Lisinopril Other (See Comments)    DIZZY    Past Medical History  Diagnosis Date  . Candidiasis of vulva and vagina   . Other chronic cystitis   . Cystocele, midline   . Disorder of bone and cartilage, unspecified   . Other sign and symptom  in breast   . Rectal incontinence   . Fibroids   . Hypertension   . TIA (transient ischemic attack)     Past Surgical History  Procedure Laterality Date  . Hysteroscopy w/d&c  03/16/1999  . Tee without cardioversion N/A 11/06/2012    Procedure: TRANSESOPHAGEAL ECHOCARDIOGRAM (TEE);  Surgeon: Lewayne Bunting, MD;  Location: [facility] ENDOSCOPY;  Service: Cardiovascular;  Laterality: N/A;    History  Smoking status  . Never Smoker   Smokeless tobacco  . Not on file    History  Alcohol Use  . 1.2 oz/week  . 2 Glasses of wine per week    Comment: 1-2 glasses of wine/week    Family History  Problem Relation Age of Onset  . Hypertension Mother   . Heart disease Father     Review of Systems: As noted in history of present illness. All other systems were reviewed and are negative.  Physical Exam: BP 162/88  Pulse 100  Ht 5\' 6"  (1.676 m)  Wt 164 lb 12.8 oz (74.753 kg)  BMI 26.61 kg/m2  SpO2 97% She is a pleasant white female in no acute distress. HEENT: Normocephalic, atraumatic. Pupils are equal round and reactive to light and accommodation. Sclera clear. Oropharynx is clear. Neck is supple no jugular venous distention, adenopathy, thyromegaly, or bruits. Lungs: Clear Cardiovascular: Regular rate  and rhythm, normal S1 and S2, no gallop or murmur. Abdomen: Soft and nontender. No masses or hepatosplenomegaly. No bruits. Extremities: No cyanosis or edema. Pulses are 2+ and symmetric. No phlebitis. Skin: Warm and dry Neuro: Alert and oriented x3. Cranial nerves II through XII are intact. No focal motor or sensory deficits.  LABORATORY DATA:  [id number] Conclusions  - Left ventricle: Systolic function was normal. The estimated ejection fraction was in the range of 55% to 60%. Wall motion was normal; there were no regional wall motion abnormalities. - Aortic valve: No evidence of vegetation. - Mitral valve: No evidence of vegetation. Mild regurgitation. - Left atrium: The  atrium was mildly dilated. No evidence of thrombus in the atrial cavity or appendage. - Atrial septum: No defect or patent foramen ovale was identified. Echo contrast study showed no right-to-left atrial level shunt, following an increase in RA pressure induced by provocative maneuvers. - Tricuspid valve: No evidence of vegetation. - Pulmonic valve: No evidence of vegetation.  Event monitor: Results are incomplete but I did review a number of her tracings. She does have evidence of PVCs. There were several episodes of nonsustained paroxysmal atrial fibrillation. Her rate was well controlled with these episodes.  Assessment / Plan: 1. TIA. Based her her event monitor showing evidence of paroxysmal atrial fibrillation I think this is the most likely etiology of her TIA. I recommended switching her from antiplatelet therapy to anticoagulant therapy. We discussed the options including Coumadin versus one of the novel agents. We have elected to start her on Eliquis 5 mg twice a day. We will stop her aspirin and Plavix. I recommended avoidance of nonsteroidal anti-inflammatory drugs. I will followup again in 3 months.  2. Paroxysmal atrial fibrillation. Rate appears to be well-controlled. We will anticoagulate but otherwise continue her prior blood pressure medication.  3. Hypertension  4. Hyperlipidemia-on pravastatin.

## 2012-12-07 ENCOUNTER — Ambulatory Visit: Payer: Medicare Other | Admitting: Cardiology

## 2012-12-07 ENCOUNTER — Telehealth: Payer: Self-pay

## 2012-12-07 NOTE — Telephone Encounter (Signed)
Patient called Dr.Jordan reviewed 30 day event monitor which revealed normal sinus rhythm with unifocal pvc's.

## 2012-12-25 ENCOUNTER — Telehealth: Payer: Self-pay | Admitting: *Deleted

## 2012-12-25 NOTE — Telephone Encounter (Signed)
eliquis approved from [facility] for 1 year, called and informed husband

## 2013-02-22 ENCOUNTER — Other Ambulatory Visit: Payer: Self-pay

## 2013-03-02 ENCOUNTER — Ambulatory Visit (INDEPENDENT_AMBULATORY_CARE_PROVIDER_SITE_OTHER): Payer: Medicare Other | Admitting: Cardiology

## 2013-03-02 ENCOUNTER — Encounter: Payer: Self-pay | Admitting: Cardiology

## 2013-03-02 VITALS — BP 160/90 | HR 88 | Ht 65.5 in | Wt 163.1 lb

## 2013-03-02 DIAGNOSIS — E785 Hyperlipidemia, unspecified: Secondary | ICD-10-CM

## 2013-03-02 DIAGNOSIS — I480 Paroxysmal atrial fibrillation: Secondary | ICD-10-CM

## 2013-03-02 DIAGNOSIS — I10 Essential (primary) hypertension: Secondary | ICD-10-CM

## 2013-03-02 DIAGNOSIS — I4891 Unspecified atrial fibrillation: Secondary | ICD-10-CM

## 2013-03-02 MED ORDER — METOPROLOL SUCCINATE ER 25 MG PO TB24: 25.0000 mg | ORAL_TABLET | Freq: Every day | ORAL | Status: DC

## 2013-03-02 NOTE — Patient Instructions (Signed)
We will start Toprol XL 25 mg daily.  Continue your other therapy  I will see you in 6 months.

## 2013-03-02 NOTE — Progress Notes (Signed)
[name] Date of Birth: 03/29/1931 Medical Record #[id number]  History of Present Illness: [name] is seen for followup of TIA and paroxysmal atrial fibrillation. On her last visit she was started on anticoagulation. On her event monitor her rate was controlled even when she was in atrial fibrillation. She's had no recurrent TIA or CVA symptoms. She reports her blood pressure has been under good control. She does have palpitations every couple of weeks. This may last anywhere from a few minutes to an hour. She usually stops and rests.  Current Outpatient Prescriptions on File Prior to Visit  Medication Sig Dispense Refill  . apixaban (ELIQUIS) 5 MG TABS tablet Take 1 tablet (5 mg total) by mouth 2 (two) times daily.  60 tablet  11  . azelastine (ASTELIN) 137 MCG/SPRAY nasal spray Place 1 spray into the nose 2 (two) times daily. Use in each nostril as directed  30 mL  11  . CALCIUM CARBONATE-VIT D-MIN PO Take 1 tablet by mouth daily.      Marland Kitchen CRANBERRY EXTRACT PO Take 1 capsule by mouth daily.       Marland Kitchen glucosamine-chondroitin 500-400 MG tablet Take 1 tablet by mouth 3 (three) times daily.        . hydrochlorothiazide (MICROZIDE) 12.5 MG capsule Take 12.5 mg by mouth daily.      Marland Kitchen loratadine (CLARITIN) 10 MG tablet Take 5 mg by mouth daily as needed for allergies.      [facility] Gala Glycol-Propyl Glycol (SYSTANE OP) Place 1 drop into both eyes daily as needed (for dry eyes).      . pravastatin (PRAVACHOL) 40 MG tablet take 1 tablet by mouth once daily  90 tablet  3   No current facility-administered medications on file prior to visit.    Allergies  Allergen Reactions  . Lisinopril Other (See Comments)    DIZZY    Past Medical History  Diagnosis Date  . Candidiasis of vulva and vagina   . Other chronic cystitis   . Cystocele, midline   . Disorder of bone and cartilage, unspecified   . Other sign and symptom in breast   . Rectal incontinence   . Fibroids   . Hypertension   . TIA  (transient ischemic attack)   . Paroxysmal atrial fibrillation 11/30/2012    Past Surgical History  Procedure Laterality Date  . Hysteroscopy w/d&c  03/16/1999  . Tee without cardioversion N/A 11/06/2012    Procedure: TRANSESOPHAGEAL ECHOCARDIOGRAM (TEE);  Surgeon: Lewayne Bunting, MD;  Location: [facility] ENDOSCOPY;  Service: Cardiovascular;  Laterality: N/A;    History  Smoking status  . Never Smoker   Smokeless tobacco  . Not on file    History  Alcohol Use  . 1.2 oz/week  . 2 Glasses of wine per week    Comment: 1-2 glasses of wine/week    Family History  Problem Relation Age of Onset  . Hypertension Mother   . Heart disease Father     Review of Systems: As noted in history of present illness. All other systems were reviewed and are negative.  Physical Exam: BP 160/90  Pulse 88  Ht 5' 5.5" (1.664 m)  Wt 163 lb 1.9 oz (73.991 kg)  BMI 26.72 kg/m2 She is a pleasant white female in no acute distress. HEENT: Normal, neck is without JVD or bruits. Lungs: Clear Cardiovascular: Regular rate and rhythm, normal S1 and S2, no gallop or murmur. Abdomen: Soft and nontender. No masses or hepatosplenomegaly. No  bruits. Extremities: No cyanosis or edema. Pulses are 2+ and symmetric. No phlebitis. Skin: Warm and dry Neuro: Alert and oriented x3. Cranial nerves II through XII are intact. No focal motor or sensory deficits.  LABORATORY DATA:   Assessment / Plan: 1. TIA. Now long-term anticoagulation. She is tolerating this well. I'll followup again in 6 months.  2. Paroxysmal atrial fibrillation. I recommended a trial of Toprol-XL 25 mg daily to help minimize her symptoms. This will also help with her blood pressure.  3. Hypertension  4. Hyperlipidemia-on pravastatin.

## 2013-04-19 DIAGNOSIS — G459 Transient cerebral ischemic attack, unspecified: Secondary | ICD-10-CM

## 2013-04-19 HISTORY — DX: Transient cerebral ischemic attack, unspecified: G45.9

## 2013-08-04 ENCOUNTER — Other Ambulatory Visit: Payer: Self-pay | Admitting: Family Medicine

## 2013-08-30 ENCOUNTER — Ambulatory Visit (INDEPENDENT_AMBULATORY_CARE_PROVIDER_SITE_OTHER): Payer: Medicare Other | Admitting: Cardiology

## 2013-08-30 ENCOUNTER — Encounter: Payer: Self-pay | Admitting: Cardiology

## 2013-08-30 VITALS — BP 172/92 | HR 79 | Ht 65.5 in | Wt 164.8 lb

## 2013-08-30 DIAGNOSIS — I10 Essential (primary) hypertension: Secondary | ICD-10-CM

## 2013-08-30 DIAGNOSIS — G459 Transient cerebral ischemic attack, unspecified: Secondary | ICD-10-CM

## 2013-08-30 DIAGNOSIS — I480 Paroxysmal atrial fibrillation: Secondary | ICD-10-CM

## 2013-08-30 DIAGNOSIS — I4891 Unspecified atrial fibrillation: Secondary | ICD-10-CM

## 2013-08-30 NOTE — Patient Instructions (Signed)
Continue your current therapy  I will see you in 6 months.   

## 2013-08-30 NOTE — Progress Notes (Signed)
[name] Date of Birth: 26-Feb-1931 Medical Record #[id number]  History of Present Illness: [name] is seen for followup of TIA and paroxysmal atrial fibrillation.  On her event monitor her rate was controlled even when she was in atrial fibrillation. She is on anticoagulation with Eliquis.  She's had no recurrent TIA or CVA symptoms. She reports her blood pressure has been under good control. BP at home today was 135/70. No significant palpitations.   Current Outpatient Prescriptions on File Prior to Visit  Medication Sig Dispense Refill  . apixaban (ELIQUIS) 5 MG TABS tablet Take 1 tablet (5 mg total) by mouth 2 (two) times daily.  60 tablet  11  . azelastine (ASTELIN) 137 MCG/SPRAY nasal spray Place 1 spray into the nose 2 (two) times daily. Use in each nostril as directed  30 mL  11  . CALCIUM CARBONATE-VIT D-MIN PO Take 1 tablet by mouth daily.      Marland Kitchen CRANBERRY EXTRACT PO Take 1 capsule by mouth daily.       Marland Kitchen glucosamine-chondroitin 500-400 MG tablet Take 1 tablet by mouth 3 (three) times daily.        . hydrochlorothiazide (MICROZIDE) 12.5 MG capsule take 1 capsule by mouth once daily  30 capsule  0  . loratadine (CLARITIN) 10 MG tablet Take 5 mg by mouth daily as needed for allergies.      . metoprolol succinate (TOPROL XL) 25 MG 24 hr tablet Take 1 tablet (25 mg total) by mouth daily.  30 tablet  11  . Polyethyl Glycol-Propyl Glycol (SYSTANE OP) Place 1 drop into both eyes daily as needed (for dry eyes).      . pravastatin (PRAVACHOL) 40 MG tablet take 1 tablet by mouth once daily  90 tablet  3   No current facility-administered medications on file prior to visit.    Allergies  Allergen Reactions  . Lisinopril Other (See Comments)    DIZZY    Past Medical History  Diagnosis Date  . Candidiasis of vulva and vagina   . Other chronic cystitis   . Cystocele, midline   . Disorder of bone and cartilage, unspecified   . Other sign and symptom in breast   . Rectal  incontinence   . Fibroids   . Hypertension   . TIA (transient ischemic attack)   . Paroxysmal atrial fibrillation 11/30/2012    Past Surgical History  Procedure Laterality Date  . Hysteroscopy w/d&c  03/16/1999  . Tee without cardioversion N/A 11/06/2012    Procedure: TRANSESOPHAGEAL ECHOCARDIOGRAM (TEE);  Surgeon: Lelon Perla, MD;  Location: [facility] ENDOSCOPY;  Service: Cardiovascular;  Laterality: N/A;    History  Smoking status  . Never Smoker   Smokeless tobacco  . Not on file    History  Alcohol Use  . 1.2 oz/week  . 2 Glasses of wine per week    Comment: 1-2 glasses of wine/week    Family History  Problem Relation Age of Onset  . Hypertension Mother   . Heart disease Father     Review of Systems: As noted in history of present illness. All other systems were reviewed and are negative.  Physical Exam: BP 172/92  Pulse 79  Ht 5' 5.5" (1.664 m)  Wt 164 lb 12.8 oz (74.753 kg)  BMI 27.00 kg/m2 She is a pleasant white female in no acute distress. HEENT: Normal, neck is without JVD or bruits. Lungs: Clear Cardiovascular: Regular rate and rhythm, normal S1 and S2, no  gallop or murmur. Abdomen: Soft and nontender. No masses or hepatosplenomegaly. No bruits. Extremities: No cyanosis or edema. Pulses are 2+ and symmetric. No phlebitis. Skin: Warm and dry Neuro: Alert and oriented x3. Cranial nerves II through XII are intact. No focal motor or sensory deficits.  LABORATORY DATA:   Assessment / Plan: 1. TIA. Now long-term anticoagulation. She is tolerating this well. I'll followup again in 6 months.  2. Paroxysmal atrial fibrillation. Addition of metoprolol did significantly improve her symptoms.   3. Hypertension  4. Hyperlipidemia-on pravastatin.  She is due for yearly lab work and will be seeing Dr. Redmond School for this.

## 2013-09-03 ENCOUNTER — Other Ambulatory Visit: Payer: Self-pay | Admitting: Family Medicine

## 2013-09-22 ENCOUNTER — Other Ambulatory Visit: Payer: Self-pay | Admitting: Family Medicine

## 2013-10-02 ENCOUNTER — Ambulatory Visit (INDEPENDENT_AMBULATORY_CARE_PROVIDER_SITE_OTHER): Payer: Medicare Other | Admitting: Family Medicine

## 2013-10-02 ENCOUNTER — Encounter: Payer: Self-pay | Admitting: Family Medicine

## 2013-10-02 VITALS — BP 112/70 | HR 72 | Wt 163.0 lb

## 2013-10-02 DIAGNOSIS — I480 Paroxysmal atrial fibrillation: Secondary | ICD-10-CM

## 2013-10-02 DIAGNOSIS — E785 Hyperlipidemia, unspecified: Secondary | ICD-10-CM

## 2013-10-02 DIAGNOSIS — I4891 Unspecified atrial fibrillation: Secondary | ICD-10-CM

## 2013-10-02 DIAGNOSIS — Z23 Encounter for immunization: Secondary | ICD-10-CM

## 2013-10-02 DIAGNOSIS — I10 Essential (primary) hypertension: Secondary | ICD-10-CM

## 2013-10-02 MED ORDER — HYDROCHLOROTHIAZIDE 12.5 MG PO CAPS: ORAL_CAPSULE | ORAL | Status: DC

## 2013-10-02 NOTE — Progress Notes (Signed)
   Subjective:    Patient ID: [name], female    DOB: 18-Nov-1930, 80 y.o.   MRN: [id number]  HPI She is here for an interval evaluation. She continues on her Microzide and is having no difficulty with this. She does have underlying atrial fibrillation and is being followed by cardiology. She continues on her statin and is having no trouble with that. She has no particular concerns or complaints. She is under a lot of stress; her husband has been having a lot of back difficulties.   Review of Systems     Objective:   Physical Exam alert and in no distress. Tympanic membranes and canals are normal. Throat is clear. Tonsils are normal. Neck is supple without adenopathy or thyromegaly. Cardiac exam shows a regular sinus rhythm without murmurs or gallops. Lungs are clear to auscultation. Review of record indicates she does need a followup pneumonia. She did say that she has  had her shingles vaccine.       Assessment & Plan:  Hypertension - Plan: CBC with Differential, Comprehensive metabolic panel, hydrochlorothiazide (MICROZIDE) 12.5 MG capsule  Hyperlipidemia LDL goal < 130 - Plan: Lipid panel  Paroxysmal atrial fibrillation  Essential hypertension, benign - Plan: CBC with Differential, Comprehensive metabolic panel  Need for prophylactic vaccination against Streptococcus pneumoniae (pneumococcus) - Plan: Pneumococcal conjugate vaccine 13-valent

## 2013-10-03 LAB — COMPREHENSIVE METABOLIC PANEL
ALK PHOS: 46 U/L (ref 39–117)
ALT: 19 U/L (ref 0–35)
AST: 23 U/L (ref 0–37)
Albumin: 4.4 g/dL (ref 3.5–5.2)
BUN: 20 mg/dL (ref 6–23)
CALCIUM: 10.3 mg/dL (ref 8.4–10.5)
CHLORIDE: 103 meq/L (ref 96–112)
CO2: 26 mEq/L (ref 19–32)
CREATININE: 0.89 mg/dL (ref 0.50–1.10)
Glucose, Bld: 97 mg/dL (ref 70–99)
Potassium: 4.3 mEq/L (ref 3.5–5.3)
Sodium: 141 mEq/L (ref 135–145)
Total Bilirubin: 0.5 mg/dL (ref 0.2–1.2)
Total Protein: 7.5 g/dL (ref 6.0–8.3)

## 2013-10-03 LAB — CBC WITH DIFFERENTIAL/PLATELET
BASOS ABS: 0.1 10*3/uL (ref 0.0–0.1)
BASOS PCT: 1 % (ref 0–1)
EOS PCT: 3 % (ref 0–5)
Eosinophils Absolute: 0.3 10*3/uL (ref 0.0–0.7)
HCT: 44.3 % (ref 36.0–46.0)
Hemoglobin: 15 g/dL (ref 12.0–15.0)
LYMPHS PCT: 45 % (ref 12–46)
Lymphs Abs: 4.2 10*3/uL — ABNORMAL HIGH (ref 0.7–4.0)
MCH: 29.1 pg (ref 26.0–34.0)
MCHC: 33.9 g/dL (ref 30.0–36.0)
MCV: 86 fL (ref 78.0–100.0)
Monocytes Absolute: 0.8 10*3/uL (ref 0.1–1.0)
Monocytes Relative: 9 % (ref 3–12)
NEUTROS ABS: 3.9 10*3/uL (ref 1.7–7.7)
Neutrophils Relative %: 42 % — ABNORMAL LOW (ref 43–77)
PLATELETS: 338 10*3/uL (ref 150–400)
RBC: 5.15 MIL/uL — ABNORMAL HIGH (ref 3.87–5.11)
RDW: 14.2 % (ref 11.5–15.5)
WBC: 9.4 10*3/uL (ref 4.0–10.5)

## 2013-10-03 LAB — LIPID PANEL
CHOL/HDL RATIO: 4 ratio
Cholesterol: 177 mg/dL (ref 0–200)
HDL: 44 mg/dL (ref >39)
LDL CALC: 90 mg/dL (ref 0–99)
Triglycerides: 217 mg/dL — ABNORMAL HIGH (ref <150)
VLDL: 43 mg/dL — AB (ref 0–40)

## 2013-10-03 MED ORDER — PRAVASTATIN SODIUM 40 MG PO TABS: ORAL_TABLET | ORAL | Status: DC

## 2013-10-03 NOTE — Addendum Note (Signed)
Addended by: [name] on: 10/03/2013 10:18 AM   Modules accepted: Orders

## 2013-12-26 ENCOUNTER — Other Ambulatory Visit: Payer: Self-pay

## 2013-12-26 MED ORDER — APIXABAN 5 MG PO TABS: 5.0000 mg | ORAL_TABLET | Freq: Two times a day (BID) | ORAL | Status: DC

## 2014-01-28 ENCOUNTER — Other Ambulatory Visit: Payer: Self-pay

## 2014-01-28 MED ORDER — METOPROLOL SUCCINATE ER 25 MG PO TB24: 25.0000 mg | ORAL_TABLET | Freq: Every day | ORAL | Status: DC

## 2014-02-05 ENCOUNTER — Telehealth: Payer: Self-pay | Admitting: Cardiology

## 2014-02-06 NOTE — Telephone Encounter (Signed)
Closed encounter °

## 2014-04-26 ENCOUNTER — Ambulatory Visit (INDEPENDENT_AMBULATORY_CARE_PROVIDER_SITE_OTHER): Payer: 59 | Admitting: Cardiology

## 2014-04-26 ENCOUNTER — Encounter: Payer: Self-pay | Admitting: Cardiology

## 2014-04-26 VITALS — BP 170/90 | HR 69 | Ht 65.0 in | Wt 163.1 lb

## 2014-04-26 DIAGNOSIS — I700 Atherosclerosis of aorta: Secondary | ICD-10-CM | POA: Diagnosis not present

## 2014-04-26 DIAGNOSIS — I480 Paroxysmal atrial fibrillation: Secondary | ICD-10-CM | POA: Diagnosis not present

## 2014-04-26 DIAGNOSIS — I10 Essential (primary) hypertension: Secondary | ICD-10-CM | POA: Diagnosis not present

## 2014-04-26 NOTE — Patient Instructions (Signed)
Continue your current therapy  I will see you in one year   

## 2014-04-26 NOTE — Progress Notes (Signed)
[name] Date of Birth: [birth year] Medical Record #[id number]  History of Present Illness: [name] is seen for followup of TIA and paroxysmal atrial fibrillation.  She is on anticoagulation with Eliquis.  She is on metoprolol for rate control and prior event monitor showed good control when she is in Afib. She's had no recurrent TIA or CVA symptoms. She reports her blood pressure has been under good control. BP at home is less than 702 systolic. She notes palpitations every once in awhile but they do not last long. She has no chest pain, SOB, or dizziness.   Current Outpatient Prescriptions on File Prior to Visit  Medication Sig Dispense Refill  . apixaban (ELIQUIS) 5 MG TABS tablet Take 1 tablet (5 mg total) by mouth 2 (two) times daily. 60 tablet 6  . azelastine (ASTELIN) 137 MCG/SPRAY nasal spray Place 1 spray into the nose 2 (two) times daily. Use in each nostril as directed 30 mL 11  . CALCIUM CARBONATE-VIT D-MIN PO Take 1 tablet by mouth daily.    Marland Kitchen CRANBERRY EXTRACT PO Take 1 capsule by mouth daily.     Marland Kitchen glucosamine-chondroitin 500-400 MG tablet Take 1 tablet by mouth 3 (three) times daily.      . hydrochlorothiazide (MICROZIDE) 12.5 MG capsule take 1 capsule by mouth once daily 90 capsule 3  . loratadine (CLARITIN) 10 MG tablet Take 5 mg by mouth daily as needed for allergies.    . metoprolol succinate (TOPROL XL) 25 MG 24 hr tablet Take 1 tablet (25 mg total) by mouth daily. 30 tablet 11  . Polyethyl Glycol-Propyl Glycol (SYSTANE OP) Place 1 drop into both eyes daily as needed (for dry eyes).    . pravastatin (PRAVACHOL) 40 MG tablet take 1 tablet by mouth once daily 90 tablet 3   No current facility-administered medications on file prior to visit.    Allergies  Allergen Reactions  . Lisinopril Other (See Comments)    DIZZY    Past Medical History  Diagnosis Date  . Candidiasis of vulva and vagina   . Other chronic cystitis   . Cystocele, midline   . Disorder of  bone and cartilage, unspecified   . Other sign and symptom in breast   . Rectal incontinence   . Fibroids   . Hypertension   . TIA (transient ischemic attack)   . Paroxysmal atrial fibrillation 11/30/2012    Past Surgical History  Procedure Laterality Date  . Hysteroscopy w/d&c  03/16/1999  . Tee without cardioversion N/A 11/06/2012    Procedure: TRANSESOPHAGEAL ECHOCARDIOGRAM (TEE);  Surgeon: Lelon Perla, MD;  Location: [facility] ENDOSCOPY;  Service: Cardiovascular;  Laterality: N/A;    History  Smoking status  . Never Smoker   Smokeless tobacco  . Not on file    History  Alcohol Use  . 1.2 oz/week  . 2 Glasses of wine per week    Comment: 1-2 glasses of wine/week    Family History  Problem Relation Age of Onset  . Hypertension Mother   . Heart disease Father     Review of Systems: As noted in history of present illness. All other systems were reviewed and are negative.  Physical Exam: BP 170/90 mmHg  Pulse 69  Ht 5\' 5"  (1.651 m)  Wt 163 lb 1.6 oz (73.982 kg)  BMI 27.14 kg/m2 She is a pleasant white female in no acute distress. HEENT: Normal, neck is without JVD or bruits. Lungs: Clear Cardiovascular: Regular rate and rhythm,  normal S1 and S2, no gallop or murmur. Abdomen: Soft and nontender. No masses or hepatosplenomegaly. No bruits. Extremities: No cyanosis or edema. Pulses are 2+ and symmetric. No phlebitis. Skin: Warm and dry Neuro: Alert and oriented x3. Cranial nerves II through XII are intact. No focal motor or sensory deficits.  LABORATORY DATA: Lab Results  Component Value Date   WBC 9.4 10/02/2013   HGB 15.0 10/02/2013   HCT 44.3 10/02/2013   PLT 338 10/02/2013   GLUCOSE 97 10/02/2013   CHOL 177 10/02/2013   TRIG 217* 10/02/2013   HDL 44 10/02/2013   LDLCALC 90 10/02/2013   ALT 19 10/02/2013   AST 23 10/02/2013   NA 141 10/02/2013   K 4.3 10/02/2013   CL 103 10/02/2013   CREATININE 0.89 10/02/2013   BUN 20 10/02/2013   CO2 26 10/02/2013    TSH 3.241 08/26/2012   INR 0.96 08/26/2012   HGBA1C 5.8* 08/26/2012   Ecg today: NSR with occ. PVC. LAD. No acute change. I have personally reviewed and interpreted this study.   Assessment / Plan: 1. TIA. Now long-term anticoagulation. She is tolerating this well. I'll followup again in 6 months.  2. Paroxysmal atrial fibrillation. Continue rate control with metoprolol.   3. Hypertension- BP elevated today but readings at home and at Dr. Redmond School have been normal.   4. Hyperlipidemia-on pravastatin.  I will follow up in one year.

## 2014-06-04 DIAGNOSIS — Z01419 Encounter for gynecological examination (general) (routine) without abnormal findings: Secondary | ICD-10-CM | POA: Diagnosis not present

## 2014-06-04 DIAGNOSIS — Z1231 Encounter for screening mammogram for malignant neoplasm of breast: Secondary | ICD-10-CM | POA: Diagnosis not present

## 2014-07-22 ENCOUNTER — Other Ambulatory Visit: Payer: Self-pay | Admitting: *Deleted

## 2014-07-22 MED ORDER — APIXABAN 5 MG PO TABS: 5.0000 mg | ORAL_TABLET | Freq: Two times a day (BID) | ORAL | Status: DC

## 2014-08-19 ENCOUNTER — Other Ambulatory Visit: Payer: Self-pay | Admitting: Family Medicine

## 2014-09-18 DIAGNOSIS — N3020 Other chronic cystitis without hematuria: Secondary | ICD-10-CM | POA: Diagnosis not present

## 2014-09-19 ENCOUNTER — Other Ambulatory Visit: Payer: Self-pay | Admitting: Family Medicine

## 2014-11-18 ENCOUNTER — Other Ambulatory Visit: Payer: Self-pay | Admitting: Family Medicine

## 2014-11-25 ENCOUNTER — Other Ambulatory Visit: Payer: Self-pay | Admitting: Family Medicine

## 2014-12-04 ENCOUNTER — Encounter: Payer: Self-pay | Admitting: Family Medicine

## 2014-12-04 ENCOUNTER — Telehealth: Payer: Self-pay | Admitting: Family Medicine

## 2014-12-04 ENCOUNTER — Other Ambulatory Visit: Payer: Self-pay | Admitting: Family Medicine

## 2014-12-04 ENCOUNTER — Ambulatory Visit (INDEPENDENT_AMBULATORY_CARE_PROVIDER_SITE_OTHER): Payer: Medicare Other | Admitting: Family Medicine

## 2014-12-04 ENCOUNTER — Ambulatory Visit
Admission: RE | Admit: 2014-12-04 | Discharge: 2014-12-04 | Disposition: A | Discharge status: Home / Self Care | Payer: 59 | Source: Ambulatory Visit | Attending: Family Medicine | Admitting: Family Medicine

## 2014-12-04 VITALS — BP 122/76 | HR 64 | Temp 98.6°F | Wt 163.6 lb

## 2014-12-04 DIAGNOSIS — M25511 Pain in right shoulder: Secondary | ICD-10-CM

## 2014-12-04 DIAGNOSIS — S4991XA Unspecified injury of right shoulder and upper arm, initial encounter: Secondary | ICD-10-CM | POA: Diagnosis not present

## 2014-12-04 NOTE — Telephone Encounter (Signed)
Mliss Sax, nurse at [facility], called stating that pt fell and has right shoulder pain and in need of a shoulder xray. Also has a history of right shoulder issues. Requesting an order for a right shoulder xray be sent to [facility] ASAP

## 2014-12-04 NOTE — Progress Notes (Signed)
   Subjective:    Patient ID: [name], female    DOB: 18-Aug-1930, 81 y.o.   MRN: [id number]  HPI She is a pleasant and alert 81 year old who presents with right shoulder pain status post fall this morning around 10:30am. She states she tripped and as she was falling stuck her right arm out to catch herself on the bedpost. She denies syncope, dizziness, chest pain, shortness of breath. She denies hitting her head. She lives at [facility] and states she visited the nurse at the facility after the fall and was told to ice her shoulder and take Tylenol. The nurse also suggested she call her PCP for a shoulder XR, which she did. She reports pain with movement to right shoulder. She also has a superficial skin abrasion to her right knee. States her knee feels ok though. Denies any other injury or complaint. States she was able to get her shirt and bra off and on by herself at the radiologist a few minutes ago.    Reviewed allergies, medications, past medical and surgical history.   Review of Systems Pertinent positives and negatives are in the history of present illness, systems otherwise negative.    Objective:   Physical Exam  Constitutional: She is oriented to person, place, and time. She appears well-developed and well-nourished. No distress.  HENT:  Head: Normocephalic and atraumatic.  Neck: Normal range of motion. Neck supple. No muscular tenderness present.  Pulmonary/Chest: Effort normal and breath sounds normal.  Musculoskeletal:       Right shoulder: She exhibits decreased range of motion and pain. She exhibits no tenderness and no deformity.  Unable to adequately perform Neers and Hawkins test due to pain to right shoulder. No laxity.   Neurological: She is alert and oriented to person, place, and time.          Assessment & Plan:  Pain in joint, shoulder region, right  Discussed that her shoulder x-ray today is negative. Try taking 2 Tylenol every 6 hours as needed for pain.  Also recommend icing shoulder for 20 minutes at a time today and tomorrow and then switch to heat. She will give [place] a call on Friday and let us know how her shoulder is feeling. She will call us sooner if her pain is not being controlled with Tylenol. She will keep her appointment on Monday as scheduled with Dr. Redmond School.

## 2014-12-04 NOTE — Patient Instructions (Addendum)
Your x-ray results are negative. Try taking 2 Tylenol every 6 hours and icing your shoulder for 20 minutes. Wait for the pain medicine to kick in and then try slowly moving your shoulder so that it does not become frozen. Touch base with [place] on Friday and let us know how your shoulder is feeling. Let us know sooner if you are getting worse and if the pain medication is not helping. Follow-up with Dr. Redmond School as scheduled Monday.

## 2014-12-04 NOTE — Telephone Encounter (Signed)
Patient hasn't been seen in this office in over a year.  She needs to be evaluated in office rather than just sending for an x-ray

## 2014-12-04 NOTE — Telephone Encounter (Signed)
Spoke to Dr Redmond School who decided to order the xray and ask that the pt go for the xray today and then come to the office today to see Vickie after the xray today. Mliss Sax was advised of these instructions and she advised the pt and husband.

## 2014-12-09 ENCOUNTER — Ambulatory Visit (INDEPENDENT_AMBULATORY_CARE_PROVIDER_SITE_OTHER): Payer: Medicare Other | Admitting: Family Medicine

## 2014-12-09 ENCOUNTER — Encounter: Payer: Self-pay | Admitting: Family Medicine

## 2014-12-09 VITALS — BP 124/80 | HR 81 | Ht 65.0 in | Wt 162.0 lb

## 2014-12-09 DIAGNOSIS — I10 Essential (primary) hypertension: Secondary | ICD-10-CM | POA: Diagnosis not present

## 2014-12-09 DIAGNOSIS — I480 Paroxysmal atrial fibrillation: Secondary | ICD-10-CM | POA: Diagnosis not present

## 2014-12-09 DIAGNOSIS — J309 Allergic rhinitis, unspecified: Secondary | ICD-10-CM

## 2014-12-09 DIAGNOSIS — E785 Hyperlipidemia, unspecified: Secondary | ICD-10-CM

## 2014-12-09 DIAGNOSIS — Z23 Encounter for immunization: Secondary | ICD-10-CM | POA: Diagnosis not present

## 2014-12-09 DIAGNOSIS — G459 Transient cerebral ischemic attack, unspecified: Secondary | ICD-10-CM

## 2014-12-09 DIAGNOSIS — I4891 Unspecified atrial fibrillation: Secondary | ICD-10-CM | POA: Diagnosis not present

## 2014-12-09 DIAGNOSIS — M8580 Other specified disorders of bone density and structure, unspecified site: Secondary | ICD-10-CM

## 2014-12-09 LAB — CBC WITH DIFFERENTIAL/PLATELET
Basophils Absolute: 0 10*3/uL (ref 0.0–0.1)
Basophils Relative: 0 % (ref 0–1)
EOS ABS: 0.3 10*3/uL (ref 0.0–0.7)
EOS PCT: 3 % (ref 0–5)
HCT: 42.4 % (ref 36.0–46.0)
Hemoglobin: 14.2 g/dL (ref 12.0–15.0)
LYMPHS ABS: 4.1 10*3/uL — AB (ref 0.7–4.0)
Lymphocytes Relative: 45 % (ref 12–46)
MCH: 28.9 pg (ref 26.0–34.0)
MCHC: 33.5 g/dL (ref 30.0–36.0)
MCV: 86.4 fL (ref 78.0–100.0)
MONOS PCT: 8 % (ref 3–12)
MPV: 9.7 fL (ref 8.6–12.4)
Monocytes Absolute: 0.7 10*3/uL (ref 0.1–1.0)
NEUTROS PCT: 44 % (ref 43–77)
Neutro Abs: 4 10*3/uL (ref 1.7–7.7)
PLATELETS: 360 10*3/uL (ref 150–400)
RBC: 4.91 MIL/uL (ref 3.87–5.11)
RDW: 14.4 % (ref 11.5–15.5)
WBC: 9 10*3/uL (ref 4.0–10.5)

## 2014-12-09 LAB — COMPREHENSIVE METABOLIC PANEL
ALT: 19 U/L (ref 6–29)
AST: 22 U/L (ref 10–35)
Albumin: 4.3 g/dL (ref 3.6–5.1)
Alkaline Phosphatase: 46 U/L (ref 33–130)
BUN: 24 mg/dL (ref 7–25)
CHLORIDE: 103 mmol/L (ref 98–110)
CO2: 26 mmol/L (ref 20–31)
Calcium: 9.5 mg/dL (ref 8.6–10.4)
Creat: 0.84 mg/dL (ref 0.60–0.88)
Glucose, Bld: 99 mg/dL (ref 65–99)
POTASSIUM: 4.1 mmol/L (ref 3.5–5.3)
Sodium: 143 mmol/L (ref 135–146)
TOTAL PROTEIN: 7.3 g/dL (ref 6.1–8.1)
Total Bilirubin: 0.5 mg/dL (ref 0.2–1.2)

## 2014-12-09 LAB — LIPID PANEL
CHOL/HDL RATIO: 3.2 ratio (ref <=5.0)
CHOLESTEROL: 153 mg/dL (ref 125–200)
HDL: 48 mg/dL (ref >=46)
LDL Cholesterol: 79 mg/dL (ref <130)
TRIGLYCERIDES: 132 mg/dL (ref <150)
VLDL: 26 mg/dL (ref <30)

## 2014-12-09 MED ORDER — PRAVASTATIN SODIUM 40 MG PO TABS: 40.0000 mg | ORAL_TABLET | Freq: Every day | ORAL | Status: DC

## 2014-12-09 MED ORDER — HYDROCHLOROTHIAZIDE 12.5 MG PO CAPS: 12.5000 mg | ORAL_CAPSULE | Freq: Every day | ORAL | Status: DC

## 2014-12-10 ENCOUNTER — Encounter: Payer: Self-pay | Admitting: Family Medicine

## 2014-12-10 NOTE — Progress Notes (Signed)
   Subjective:    Patient ID: [name], female    DOB: 1930/08/17, 81 y.o.   MRN: [id number]  HPI She is here for a medication check. She fell on the 17th when she tripped. X-rays at that time were negative. She states that her shoulder is feeling less painful today. She has a history of PAF and is on  Eliquis and is followed by cardiology.She does have a history of osteopenia and is on multivitamins with calcium. She also has underlying allergies and states that they are under good control. She has a previous history of TIA but presently is having no neurologic sequelae. He continues on HCTZ. She has no concerns about cognitive function. No evidence of depression. Physical activity was reviewed. She does keep yourself active but not involved in a regular program. She and her husband recently moved into a retirement center. She does have an advanced directive.  Review of Systems     Objective:   Physical Exam Alert and in no distress. Tympanic membranes and canals are normal. Pharyngeal area is normal. Neck is supple without adenopathy or thyromegaly. Cardiac exam shows a regular sinus rhythm without murmurs or gallops. Lungs are clear to auscultation. Right shoulder exam does show show some pain on abduction and internal as well as external rotation however she states this is better than several days ago.       Assessment & Plan:  Paroxysmal atrial fibrillation - Plan: CBC with Differential/Platelet, Comprehensive metabolic panel, Lipid panel  Hyperlipidemia with target LDL less than 130 - Plan: pravastatin (PRAVACHOL) 40 MG tablet, Lipid panel  Osteopenia - Plan: CBC with Differential/Platelet, Comprehensive metabolic panel, Lipid panel  Allergic rhinitis, mild  Transient cerebral ischemia, unspecified transient cerebral ischemia type - Plan: CBC with Differential/Platelet, Comprehensive metabolic panel, Lipid panel  Essential hypertension, benign - Plan: hydrochlorothiazide  (MICROZIDE) 12.5 MG capsule  Need for prophylactic vaccination against Streptococcus pneumoniae (pneumococcus) - Plan: Pneumococcal polysaccharide vaccine 23-valent greater than or equal to 2yo subcutaneous/IM I encouraged her to stay physically active. The new living environment should be quite helpful for her to stay engaged. All of her questions were answered. Approximately 30 minutes spent, greater than 50% in coordination of care

## 2015-01-17 ENCOUNTER — Other Ambulatory Visit: Payer: Self-pay | Admitting: *Deleted

## 2015-01-17 MED ORDER — METOPROLOL SUCCINATE ER 25 MG PO TB24: 25.0000 mg | ORAL_TABLET | Freq: Every day | ORAL | Status: DC

## 2015-02-14 DIAGNOSIS — H04223 Epiphora due to insufficient drainage, bilateral lacrimal glands: Secondary | ICD-10-CM | POA: Diagnosis not present

## 2015-02-14 DIAGNOSIS — H2511 Age-related nuclear cataract, right eye: Secondary | ICD-10-CM | POA: Diagnosis not present

## 2015-02-14 DIAGNOSIS — H524 Presbyopia: Secondary | ICD-10-CM | POA: Diagnosis not present

## 2015-02-17 ENCOUNTER — Other Ambulatory Visit: Payer: Self-pay | Admitting: *Deleted

## 2015-02-17 MED ORDER — APIXABAN 5 MG PO TABS: 5.0000 mg | ORAL_TABLET | Freq: Two times a day (BID) | ORAL | Status: DC

## 2015-05-13 ENCOUNTER — Ambulatory Visit (INDEPENDENT_AMBULATORY_CARE_PROVIDER_SITE_OTHER): Payer: Medicare Other | Admitting: Cardiology

## 2015-05-13 ENCOUNTER — Encounter: Payer: Self-pay | Admitting: Cardiology

## 2015-05-13 VITALS — BP 152/86 | HR 67 | Ht 65.0 in | Wt 164.2 lb

## 2015-05-13 DIAGNOSIS — E785 Hyperlipidemia, unspecified: Secondary | ICD-10-CM | POA: Diagnosis not present

## 2015-05-13 DIAGNOSIS — I10 Essential (primary) hypertension: Secondary | ICD-10-CM | POA: Diagnosis not present

## 2015-05-13 DIAGNOSIS — I480 Paroxysmal atrial fibrillation: Secondary | ICD-10-CM | POA: Diagnosis not present

## 2015-05-13 NOTE — Patient Instructions (Signed)
Continue your current therapy  I will see you in one year   

## 2015-05-13 NOTE — Progress Notes (Signed)
[name] Date of Birth: 11-27-1930 Medical Record [id number]  History of Present Illness: Mrs. [name] is seen for followup for history of TIA and paroxysmal atrial fibrillation.  She is on anticoagulation with Eliquis.  She is on metoprolol for rate control and prior event monitor showed good control when she is in Afib. She's had no recurrent TIA or CVA symptoms. She reports her blood pressure has been under good control. BP at home is typically in the Q000111Q systolic. She notes only one episode of Afib lasting about 15 minutes. She has no chest pain, SOB, or dizziness. She is now living at [facility]  Current Outpatient Prescriptions on File Prior to Visit  Medication Sig Dispense Refill  . apixaban (ELIQUIS) 5 MG TABS tablet Take 1 tablet (5 mg total) by mouth 2 (two) times daily. 60 tablet 6  . azelastine (ASTELIN) 137 MCG/SPRAY nasal spray Place 1 spray into the nose 2 (two) times daily. Use in each nostril as directed 30 mL 11  . CALCIUM CARBONATE-VIT D-MIN PO Take 1 tablet by mouth daily.    Marland Kitchen CRANBERRY EXTRACT PO Take 1 capsule by mouth daily.     Marland Kitchen glucosamine-chondroitin 500-400 MG tablet Take 1 tablet by mouth 3 (three) times daily.      . hydrochlorothiazide (MICROZIDE) 12.5 MG capsule Take 1 capsule (12.5 mg total) by mouth daily. 90 capsule 3  . loratadine (CLARITIN) 10 MG tablet Take 5 mg by mouth daily as needed for allergies.    . metoprolol succinate (TOPROL XL) 25 MG 24 hr tablet Take 1 tablet (25 mg total) by mouth daily. 30 tablet 3  . Polyethyl Glycol-Propyl Glycol (SYSTANE OP) Place 1 drop into both eyes daily as needed (for dry eyes).    . pravastatin (PRAVACHOL) 40 MG tablet Take 1 tablet (40 mg total) by mouth daily. 30 tablet 3   No current facility-administered medications on file prior to visit.    Allergies  Allergen Reactions  . Lisinopril Other (See Comments)    DIZZY    Past Medical History  Diagnosis Date  . Candidiasis of vulva and vagina   .  Other chronic cystitis   . Cystocele, midline   . Disorder of bone and cartilage, unspecified   . Other sign and symptom in breast   . Rectal incontinence   . Fibroids   . Hypertension   . TIA (transient ischemic attack)   . Paroxysmal atrial fibrillation ([place]) 11/30/2012    Past Surgical History  Procedure Laterality Date  . Hysteroscopy w/d&c  03/16/1999  . Tee without cardioversion N/A 11/06/2012    Procedure: TRANSESOPHAGEAL ECHOCARDIOGRAM (TEE);  Surgeon: Lelon Perla, MD;  Location: [facility] ENDOSCOPY;  Service: Cardiovascular;  Laterality: N/A;    History  Smoking status  . Never Smoker   Smokeless tobacco  . Not on file    History  Alcohol Use  . 1.2 oz/week  . 2 Glasses of wine per week    Comment: 1-2 glasses of wine/week    Family History  Problem Relation Age of Onset  . Hypertension Mother   . Heart disease Father     Review of Systems: As noted in history of present illness. All other systems were reviewed and are negative.  Physical Exam: BP 152/86 mmHg  Pulse 67  Ht 5\' 5"  (1.651 m)  Wt 74.475 kg (164 lb 3 oz)  BMI 27.32 kg/m2 She is a pleasant white female in no acute distress. HEENT: Normal, neck is  without JVD or bruits. Lungs: Clear Cardiovascular: Regular rate and rhythm, normal S1 and S2, no gallop or murmur. Abdomen: Soft and nontender. No masses or hepatosplenomegaly. No bruits. Extremities: No cyanosis or edema. Pulses are 2+ and symmetric. No phlebitis. Skin: Warm and dry Neuro: Alert and oriented x3. Cranial nerves II through XII are intact. No focal motor or sensory deficits.  LABORATORY DATA: Lab Results  Component Value Date   WBC 9.0 12/09/2014   HGB 14.2 12/09/2014   HCT 42.4 12/09/2014   PLT 360 12/09/2014   GLUCOSE 99 12/09/2014   CHOL 153 12/09/2014   TRIG 132 12/09/2014   HDL 48 12/09/2014   LDLCALC 79 12/09/2014   ALT 19 12/09/2014   AST 22 12/09/2014   NA 143 12/09/2014   K 4.1 12/09/2014   CL 103 12/09/2014    CREATININE 0.84 12/09/2014   BUN 24 12/09/2014   CO2 26 12/09/2014   TSH 3.241 08/26/2012   INR 0.96 08/26/2012   HGBA1C 5.8* 08/26/2012   Ecg today: NSR. Possible old septal infarct age undetermined.  No acute change. I have personally reviewed and interpreted this study.   Assessment / Plan: 1. TIA. Now long-term anticoagulation. She is tolerating this well. I'll followup again in one year.  2. Paroxysmal atrial fibrillation. Continue rate control with metoprolol.   3. Hypertension- BP generally under good control.  4. Hyperlipidemia-on pravastatin with good results.  I will follow up in one year.

## 2015-05-20 ENCOUNTER — Other Ambulatory Visit: Payer: Self-pay | Admitting: *Deleted

## 2015-05-20 MED ORDER — METOPROLOL SUCCINATE ER 25 MG PO TB24: 25.0000 mg | ORAL_TABLET | Freq: Every day | ORAL | Status: DC

## 2015-05-27 ENCOUNTER — Other Ambulatory Visit: Payer: Self-pay | Admitting: *Deleted

## 2015-05-27 ENCOUNTER — Telehealth: Payer: Self-pay | Admitting: Family Medicine

## 2015-05-27 DIAGNOSIS — E785 Hyperlipidemia, unspecified: Secondary | ICD-10-CM

## 2015-05-27 MED ORDER — PRAVASTATIN SODIUM 40 MG PO TABS: 40.0000 mg | ORAL_TABLET | Freq: Every day | ORAL | Status: DC

## 2015-05-27 NOTE — Telephone Encounter (Signed)
Pt husband called and said that his wife needed a refill on her pravastatin, said she was out, and that she needed them today, said the pharmacy called [place] a while ago that she needed a refill but i didn't see that, pt uses [id number], [address] [place] and pt can be reached at [phone] (H)

## 2015-05-27 NOTE — Telephone Encounter (Signed)
Refill 2/7

## 2015-06-17 DIAGNOSIS — Z01419 Encounter for gynecological examination (general) (routine) without abnormal findings: Secondary | ICD-10-CM | POA: Diagnosis not present

## 2015-06-17 DIAGNOSIS — Z1231 Encounter for screening mammogram for malignant neoplasm of breast: Secondary | ICD-10-CM | POA: Diagnosis not present

## 2015-06-25 DIAGNOSIS — Z1212 Encounter for screening for malignant neoplasm of rectum: Secondary | ICD-10-CM | POA: Diagnosis not present

## 2015-07-03 DIAGNOSIS — M85852 Other specified disorders of bone density and structure, left thigh: Secondary | ICD-10-CM | POA: Diagnosis not present

## 2015-08-25 ENCOUNTER — Other Ambulatory Visit: Payer: Self-pay

## 2015-08-25 ENCOUNTER — Telehealth: Payer: Self-pay | Admitting: Family Medicine

## 2015-08-25 DIAGNOSIS — E785 Hyperlipidemia, unspecified: Secondary | ICD-10-CM

## 2015-08-25 MED ORDER — PRAVASTATIN SODIUM 40 MG PO TABS: 40.0000 mg | ORAL_TABLET | Freq: Every day | ORAL | Status: DC

## 2015-08-25 NOTE — Telephone Encounter (Signed)
Rcvd refill request for Pravastatin 40 mg #30

## 2015-08-25 NOTE — Telephone Encounter (Signed)
Sent in

## 2015-09-13 ENCOUNTER — Other Ambulatory Visit: Payer: Self-pay | Admitting: Pharmacist Clinician (PhC)/ Clinical Pharmacy Specialist

## 2015-09-13 MED ORDER — APIXABAN 5 MG PO TABS: 5.0000 mg | ORAL_TABLET | Freq: Two times a day (BID) | ORAL | Status: DC

## 2015-09-17 DIAGNOSIS — N3020 Other chronic cystitis without hematuria: Secondary | ICD-10-CM | POA: Diagnosis not present

## 2015-09-17 DIAGNOSIS — Z0000 Encounter for general adult medical examination without abnormal findings: Secondary | ICD-10-CM | POA: Diagnosis not present

## 2015-12-20 ENCOUNTER — Other Ambulatory Visit: Payer: Self-pay | Admitting: Family Medicine

## 2015-12-20 DIAGNOSIS — E785 Hyperlipidemia, unspecified: Secondary | ICD-10-CM

## 2016-01-12 ENCOUNTER — Other Ambulatory Visit: Payer: Self-pay | Admitting: Family Medicine

## 2016-01-12 DIAGNOSIS — I10 Essential (primary) hypertension: Secondary | ICD-10-CM

## 2016-01-15 ENCOUNTER — Ambulatory Visit (INDEPENDENT_AMBULATORY_CARE_PROVIDER_SITE_OTHER): Payer: Medicare Other | Admitting: Family Medicine

## 2016-01-15 ENCOUNTER — Other Ambulatory Visit: Payer: Self-pay | Admitting: Family Medicine

## 2016-01-15 ENCOUNTER — Encounter: Payer: Self-pay | Admitting: Family Medicine

## 2016-01-15 VITALS — BP 122/70 | HR 68 | Ht 66.0 in | Wt 168.0 lb

## 2016-01-15 DIAGNOSIS — Z79899 Other long term (current) drug therapy: Secondary | ICD-10-CM | POA: Diagnosis not present

## 2016-01-15 DIAGNOSIS — E785 Hyperlipidemia, unspecified: Secondary | ICD-10-CM

## 2016-01-15 DIAGNOSIS — I10 Essential (primary) hypertension: Secondary | ICD-10-CM | POA: Diagnosis not present

## 2016-01-15 DIAGNOSIS — J309 Allergic rhinitis, unspecified: Secondary | ICD-10-CM

## 2016-01-15 DIAGNOSIS — I480 Paroxysmal atrial fibrillation: Secondary | ICD-10-CM

## 2016-01-15 LAB — CBC WITH DIFFERENTIAL/PLATELET
BASOS PCT: 0 %
Basophils Absolute: 0 cells/uL (ref 0–200)
EOS ABS: 219 {cells}/uL (ref 15–500)
EOS PCT: 3 %
HCT: 43.6 % (ref 35.0–45.0)
Hemoglobin: 14.6 g/dL (ref 11.7–15.5)
LYMPHS PCT: 45 %
Lymphs Abs: 3285 cells/uL (ref 850–3900)
MCH: 29 pg (ref 27.0–33.0)
MCHC: 33.5 g/dL (ref 32.0–36.0)
MCV: 86.5 fL (ref 80.0–100.0)
MONOS PCT: 11 %
MPV: 9.5 fL (ref 7.5–12.5)
Monocytes Absolute: 803 cells/uL (ref 200–950)
NEUTROS ABS: 2993 {cells}/uL (ref 1500–7800)
Neutrophils Relative %: 41 %
PLATELETS: 314 10*3/uL (ref 140–400)
RBC: 5.04 MIL/uL (ref 3.80–5.10)
RDW: 14.9 % (ref 11.0–15.0)
WBC: 7.3 10*3/uL (ref 4.0–10.5)

## 2016-01-15 LAB — COMPREHENSIVE METABOLIC PANEL
ALK PHOS: 43 U/L (ref 33–130)
ALT: 21 U/L (ref 6–29)
AST: 23 U/L (ref 10–35)
Albumin: 4.5 g/dL (ref 3.6–5.1)
BUN: 21 mg/dL (ref 7–25)
CO2: 25 mmol/L (ref 20–31)
CREATININE: 0.86 mg/dL (ref 0.60–0.88)
Calcium: 10.1 mg/dL (ref 8.6–10.4)
Chloride: 102 mmol/L (ref 98–110)
GLUCOSE: 101 mg/dL — AB (ref 65–99)
POTASSIUM: 3.7 mmol/L (ref 3.5–5.3)
SODIUM: 142 mmol/L (ref 135–146)
TOTAL PROTEIN: 7.4 g/dL (ref 6.1–8.1)
Total Bilirubin: 0.6 mg/dL (ref 0.2–1.2)

## 2016-01-15 LAB — LIPID PANEL
CHOL/HDL RATIO: 3.3 ratio (ref <=5.0)
CHOLESTEROL: 140 mg/dL (ref 125–200)
HDL: 43 mg/dL — ABNORMAL LOW (ref >=46)
LDL Cholesterol: 63 mg/dL (ref <130)
Triglycerides: 171 mg/dL — ABNORMAL HIGH (ref <150)
VLDL: 34 mg/dL — ABNORMAL HIGH (ref <30)

## 2016-01-15 MED ORDER — METOPROLOL SUCCINATE ER 50 MG PO TB24: 50.0000 mg | ORAL_TABLET | Freq: Every day | ORAL | 3 refills | Status: DC

## 2016-01-15 NOTE — Progress Notes (Signed)
   Subjective:    Patient ID: [name], female    DOB: Nov 29, 1930, 82 y.o.   MRN: [id number]  HPI She is here for multiple concerns. She does have underlying allergies and presently is having difficulty with dry cough, sneezing, congestion. She usually has trouble with the spring but now notes difficulty with allergy symptoms in the fall. In the past she has used Astelin nasal spray and presently is on Claritin-D. She also has an underlying history of atrial fibrillation and is on Toprol. She does occasionally have breakthroughs with rapid heart rate. She also continues on Eliquis. She continues on Pravachol and is having no muscle aches or pains with this. She also has a history of hypertension and is also on HCTZ.  Review of Systems     Objective:   Physical Exam Alert and in no distress. Tympanic membranes and canals are normal. Pharyngeal area is normal. Neck is supple without adenopathy or thyromegaly. Cardiac exam shows a regular sinus rhythm without murmurs or gallops. Lungs are clear to auscultation.        Assessment & Plan:  Paroxysmal atrial fibrillation (HCC) - Plan: metoprolol succinate (TOPROL-XL) 50 MG 24 hr tablet  Essential hypertension, benign - Plan: CBC with Differential/Platelet, Comprehensive metabolic panel  Allergic rhinitis, mild  Hyperlipidemia with target LDL less than 130 - Plan: Lipid panel  Encounter for long-term (current) use of medications - Plan: CBC with Differential/Platelet, Comprehensive metabolic panel, Lipid panel Recommend that she stop the Claritin-D and switch to regular Claritin as well as Nasacort to see if this will help with her allergy symptoms. I will increase her Toprol to 50 mg and see if this will help prevent her breakthrough atrial fib. Interesting that her heart rate today is a normal rhythm. Routine blood screening also done. She will call if she has any troubles.

## 2016-01-15 NOTE — Patient Instructions (Signed)
Doppler Claritin-D and just take regular Claritin. Use Rhinocort nasal spray

## 2016-02-17 DIAGNOSIS — H52203 Unspecified astigmatism, bilateral: Secondary | ICD-10-CM | POA: Diagnosis not present

## 2016-02-17 DIAGNOSIS — H2511 Age-related nuclear cataract, right eye: Secondary | ICD-10-CM | POA: Diagnosis not present

## 2016-04-11 ENCOUNTER — Other Ambulatory Visit: Payer: Self-pay | Admitting: Family Medicine

## 2016-04-11 ENCOUNTER — Other Ambulatory Visit: Payer: Self-pay | Admitting: Cardiology

## 2016-04-11 DIAGNOSIS — I10 Essential (primary) hypertension: Secondary | ICD-10-CM

## 2016-05-16 NOTE — Progress Notes (Signed)
[name] Date of Birth: 1931-04-07 Medical Record [id number]  History of Present Illness: [name] is seen for followup for history of TIA and paroxysmal atrial fibrillation.  She is on anticoagulation with Eliquis.  She is on metoprolol for rate control and prior event monitor showed good control when she is in Afib. She's had no recurrent TIA or CVA symptoms. She reports her blood pressure has been under good control. BP at home is typically in the Q000111Q systolic. She did note increased symptoms of Afib last fall. Noted she was under increased stress. Dr. Redmond School increased her Toprol to 50 mg daily and this really seemed to help.   Current Outpatient Prescriptions on File Prior to Visit  Medication Sig Dispense Refill  . azelastine (ASTELIN) 137 MCG/SPRAY nasal spray Place 1 spray into the nose 2 (two) times daily. Use in each nostril as directed 30 mL 11  . CALCIUM CARBONATE-VIT D-MIN PO Take 2 tablets by mouth daily.     Marland Kitchen CRANBERRY EXTRACT PO Take 1 capsule by mouth daily.     Marland Kitchen ELIQUIS 5 MG TABS tablet take 1 tablet by mouth twice a day 60 tablet 2  . glucosamine-chondroitin 500-400 MG tablet Take 1 tablet by mouth 3 (three) times daily.      . hydrochlorothiazide (MICROZIDE) 12.5 MG capsule take 1 capsule by mouth once daily 90 capsule 2  . loratadine (CLARITIN) 10 MG tablet Take 5 mg by mouth daily as needed for allergies.    . metoprolol succinate (TOPROL-XL) 50 MG 24 hr tablet Take 1 tablet (50 mg total) by mouth daily. Take with or immediately following a meal. 90 tablet 3  . Polyethyl Glycol-Propyl Glycol (SYSTANE OP) Place 1 drop into both eyes daily as needed (for dry eyes).    . pravastatin (PRAVACHOL) 40 MG tablet take 1 tablet by mouth once daily 30 tablet 11   No current facility-administered medications on file prior to visit.     Allergies  Allergen Reactions  . Lisinopril Other (See Comments)    DIZZY    Past Medical History:  Diagnosis Date  . Candidiasis  of vulva and vagina   . Cystocele, midline   . Disorder of bone and cartilage, unspecified   . Fibroids   . Hypertension   . Other chronic cystitis   . Other sign and symptom in breast   . Paroxysmal atrial fibrillation ([place]) 11/30/2012  . Rectal incontinence   . TIA (transient ischemic attack)     Past Surgical History:  Procedure Laterality Date  . HYSTEROSCOPY W/D&C  03/16/1999  . TEE WITHOUT CARDIOVERSION N/A 11/06/2012   Procedure: TRANSESOPHAGEAL ECHOCARDIOGRAM (TEE);  Surgeon: Lelon Perla, MD;  Location: [facility] ENDOSCOPY;  Service: Cardiovascular;  Laterality: N/A;    History  Smoking Status  . Never Smoker  Smokeless Tobacco  . Never Used    History  Alcohol Use  . 1.2 oz/week  . 2 Glasses of wine per week    Comment: 1-2 glasses of wine/week    Family History  Problem Relation Age of Onset  . Hypertension Mother   . Heart disease Father     Review of Systems: As noted in history of present illness. All other systems were reviewed and are negative.  Physical Exam: BP (!) 150/80 (BP Location: Right Arm, Cuff Size: Normal)   Pulse 67   Ht 5\' 6"  (1.676 m)   Wt 169 lb (76.7 kg)   BMI 27.28 kg/m  She is  a pleasant white female in no acute distress. HEENT: Normal, neck is without JVD or bruits. Lungs: Clear Cardiovascular: Regular rate and rhythm, normal S1 and S2, no gallop or murmur. Abdomen: Soft and nontender. No masses or hepatosplenomegaly. No bruits. Extremities: No cyanosis or edema. Pulses are 2+ and symmetric. No phlebitis. Skin: Warm and dry Neuro: Alert and oriented x3. Cranial nerves II through XII are intact. No focal motor or sensory deficits.  LABORATORY DATA: Lab Results  Component Value Date   WBC 7.3 01/15/2016   HGB 14.6 01/15/2016   HCT 43.6 01/15/2016   PLT 314 01/15/2016   GLUCOSE 101 (H) 01/15/2016   CHOL 140 01/15/2016   TRIG 171 (H) 01/15/2016   HDL 43 (L) 01/15/2016   LDLCALC 63 01/15/2016   ALT 21 01/15/2016   AST 23  01/15/2016   NA 142 01/15/2016   K 3.7 01/15/2016   CL 102 01/15/2016   CREATININE 0.86 01/15/2016   BUN 21 01/15/2016   CO2 25 01/15/2016   TSH 3.241 08/26/2012   INR 0.96 08/26/2012   HGBA1C 5.8 (H) 08/26/2012   Ecg today: NSR. Rate 67. Possible old septal infarct age undetermined.  No acute change. I have personally reviewed and interpreted this study.   Assessment / Plan: 1. TIA. Now long-term anticoagulation with Eliquis. She is tolerating this well. I'll followup again in one year.  2. Paroxysmal atrial fibrillation. Continue rate control with metoprolol. Agree with increased dose of metoprolol. Informed her she could take extra 25 mg as needed. If she has more sustained or frequent Afib would need to consider anti-arrhythmic drug therapy.  3. Hypertension- BP generally under good control.  4. Hyperlipidemia-on pravastatin with good results.  I will follow up in one year.

## 2016-05-17 ENCOUNTER — Encounter: Payer: Self-pay | Admitting: Cardiology

## 2016-05-17 ENCOUNTER — Ambulatory Visit (INDEPENDENT_AMBULATORY_CARE_PROVIDER_SITE_OTHER): Payer: Medicare Other | Admitting: Cardiology

## 2016-05-17 VITALS — BP 150/80 | HR 67 | Ht 66.0 in | Wt 169.0 lb

## 2016-05-17 DIAGNOSIS — I480 Paroxysmal atrial fibrillation: Secondary | ICD-10-CM

## 2016-05-17 DIAGNOSIS — I10 Essential (primary) hypertension: Secondary | ICD-10-CM | POA: Diagnosis not present

## 2016-05-17 DIAGNOSIS — E785 Hyperlipidemia, unspecified: Secondary | ICD-10-CM | POA: Diagnosis not present

## 2016-05-17 NOTE — Patient Instructions (Addendum)
Continue your current therapy  You can take an extra 25 mg of metoprolol if needed  I will see you in one year

## 2016-06-15 ENCOUNTER — Encounter: Payer: Self-pay | Admitting: Family Medicine

## 2016-06-15 ENCOUNTER — Other Ambulatory Visit: Payer: Self-pay | Admitting: Cardiology

## 2016-06-15 ENCOUNTER — Ambulatory Visit (INDEPENDENT_AMBULATORY_CARE_PROVIDER_SITE_OTHER): Payer: Medicare Other | Admitting: Family Medicine

## 2016-06-15 VITALS — BP 130/90 | HR 82 | Temp 98.0°F | Wt 167.0 lb

## 2016-06-15 DIAGNOSIS — J309 Allergic rhinitis, unspecified: Secondary | ICD-10-CM

## 2016-06-15 DIAGNOSIS — I480 Paroxysmal atrial fibrillation: Secondary | ICD-10-CM

## 2016-06-15 DIAGNOSIS — J181 Lobar pneumonia, unspecified organism: Secondary | ICD-10-CM

## 2016-06-15 DIAGNOSIS — J189 Pneumonia, unspecified organism: Secondary | ICD-10-CM

## 2016-06-15 MED ORDER — BENZONATATE 100 MG PO CAPS: 200.0000 mg | ORAL_CAPSULE | Freq: Three times a day (TID) | ORAL | 0 refills | Status: DC | PRN

## 2016-06-15 MED ORDER — LEVOFLOXACIN 500 MG PO TABS: 500.0000 mg | ORAL_TABLET | Freq: Every day | ORAL | 0 refills | Status: DC

## 2016-06-15 NOTE — Progress Notes (Signed)
   Subjective:    Patient ID: [name], female    DOB: [birth year], 83 y.o.   MRN: [id number]  HPI She has a four-day history of started with a slight sore throat followed by a cough that has become more harsh and nonproductive, fever of 99. Malaise as well as nasal congestion and rhinorrhea. No fatigue. She does not smoke. She does have underlying atrial fibrillation as well as allergic rhinitis.   Review of Systems     Objective:   Physical Exam Alert and in no distress. Tympanic membranes and canals are normal. Pharyngeal area is normal. Neck is supple without adenopathy or thyromegaly. Cardiac exam shows a regular sinus rhythm without murmurs or gallops. Lungs show harsh sounds in the left lower lung area. Medications were reviewed.       Assessment & Plan:  Pneumonia of left lower lobe due to infectious organism ([place]) - Plan: levofloxacin (LEVAQUIN) 500 MG tablet, benzonatate (TESSALON) 100 MG capsule  Allergic rhinitis, mild  Paroxysmal atrial fibrillation (HCC) Clinically I think she has pneumonia. I will treat her as such. Cautioned to call me if she has difficulty with increasing cough, congestion, fever, shortness of breath. I discussed further workup including chest x-ray but at this point it would not change my clinical course. She was comfortable with that.

## 2016-06-16 ENCOUNTER — Telehealth: Payer: Self-pay

## 2016-06-16 NOTE — Telephone Encounter (Signed)
Pt's husband called the office and left VCM after 5:00PM to let us know that tessalon pearles are $80/90 at pharmacy. Pharmacist recommended that she take Mucinex DM, but [place] is concerned with taking this with hx of afibb.   LM for Mr. Deeken to callback. Victorino December

## 2016-06-18 ENCOUNTER — Telehealth: Payer: Self-pay

## 2016-06-18 NOTE — Telephone Encounter (Signed)
Let her know that this is an excellent drug and we will try to wait this out

## 2016-06-18 NOTE — Telephone Encounter (Signed)
Still has cough and congestion she has taken 3 pills still has 4 left and the leaflet that came with meds did say can cause hallucinations. But she said will continue the meds if you want her to or she can change please advise

## 2016-06-18 NOTE — Telephone Encounter (Signed)
[name] called to let you know she started to have hallucinations last night and still has 4 days of Levaquin left. She has been taking Levaquin at night and not sure if hallucinations are related to that. She is seeing things that aren't there, lights in her room and seeing her husband when he is not there. CB # [id number]. [name]

## 2016-06-18 NOTE — Telephone Encounter (Signed)
Pt informed and verbalized understanding

## 2016-06-18 NOTE — Telephone Encounter (Signed)
I would not think this is related to her antibiotic. She how she is doing with off, congestion and shortness of breath

## 2016-06-21 ENCOUNTER — Emergency Department (HOSPITAL_COMMUNITY): Payer: Medicare Other

## 2016-06-21 ENCOUNTER — Inpatient Hospital Stay (HOSPITAL_COMMUNITY)
Admission: EM | Admit: 2016-06-21 | Discharge: 2016-07-01 | DRG: 391 | Disposition: A | Discharge status: Home Health | Payer: Medicare Other | Attending: Internal Medicine | Admitting: Internal Medicine

## 2016-06-21 DIAGNOSIS — T501X5A Adverse effect of loop [high-ceiling] diuretics, initial encounter: Secondary | ICD-10-CM | POA: Diagnosis not present

## 2016-06-21 DIAGNOSIS — R778 Other specified abnormalities of plasma proteins: Secondary | ICD-10-CM | POA: Diagnosis not present

## 2016-06-21 DIAGNOSIS — I700 Atherosclerosis of aorta: Secondary | ICD-10-CM | POA: Diagnosis present

## 2016-06-21 DIAGNOSIS — I5033 Acute on chronic diastolic (congestive) heart failure: Secondary | ICD-10-CM | POA: Diagnosis not present

## 2016-06-21 DIAGNOSIS — E860 Dehydration: Secondary | ICD-10-CM | POA: Diagnosis present

## 2016-06-21 DIAGNOSIS — K449 Diaphragmatic hernia without obstruction or gangrene: Secondary | ICD-10-CM | POA: Diagnosis not present

## 2016-06-21 DIAGNOSIS — K56609 Unspecified intestinal obstruction, unspecified as to partial versus complete obstruction: Secondary | ICD-10-CM

## 2016-06-21 DIAGNOSIS — Y92230 Patient room in hospital as the place of occurrence of the external cause: Secondary | ICD-10-CM | POA: Diagnosis not present

## 2016-06-21 DIAGNOSIS — I110 Hypertensive heart disease with heart failure: Secondary | ICD-10-CM | POA: Diagnosis not present

## 2016-06-21 DIAGNOSIS — R946 Abnormal results of thyroid function studies: Secondary | ICD-10-CM | POA: Diagnosis not present

## 2016-06-21 DIAGNOSIS — J069 Acute upper respiratory infection, unspecified: Secondary | ICD-10-CM | POA: Diagnosis present

## 2016-06-21 DIAGNOSIS — I480 Paroxysmal atrial fibrillation: Secondary | ICD-10-CM | POA: Diagnosis not present

## 2016-06-21 DIAGNOSIS — Z7901 Long term (current) use of anticoagulants: Secondary | ICD-10-CM

## 2016-06-21 DIAGNOSIS — I481 Persistent atrial fibrillation: Secondary | ICD-10-CM | POA: Diagnosis not present

## 2016-06-21 DIAGNOSIS — J690 Pneumonitis due to inhalation of food and vomit: Secondary | ICD-10-CM | POA: Diagnosis not present

## 2016-06-21 DIAGNOSIS — I509 Heart failure, unspecified: Secondary | ICD-10-CM | POA: Diagnosis not present

## 2016-06-21 DIAGNOSIS — E876 Hypokalemia: Secondary | ICD-10-CM | POA: Diagnosis not present

## 2016-06-21 DIAGNOSIS — E0781 Sick-euthyroid syndrome: Secondary | ICD-10-CM | POA: Diagnosis not present

## 2016-06-21 DIAGNOSIS — J90 Pleural effusion, not elsewhere classified: Secondary | ICD-10-CM

## 2016-06-21 DIAGNOSIS — E8770 Fluid overload, unspecified: Secondary | ICD-10-CM | POA: Diagnosis not present

## 2016-06-21 DIAGNOSIS — R079 Chest pain, unspecified: Secondary | ICD-10-CM | POA: Diagnosis not present

## 2016-06-21 DIAGNOSIS — K3189 Other diseases of stomach and duodenum: Secondary | ICD-10-CM | POA: Diagnosis not present

## 2016-06-21 DIAGNOSIS — K760 Fatty (change of) liver, not elsewhere classified: Secondary | ICD-10-CM | POA: Diagnosis not present

## 2016-06-21 DIAGNOSIS — I5031 Acute diastolic (congestive) heart failure: Secondary | ICD-10-CM | POA: Insufficient documentation

## 2016-06-21 DIAGNOSIS — I10 Essential (primary) hypertension: Secondary | ICD-10-CM | POA: Diagnosis present

## 2016-06-21 DIAGNOSIS — R748 Abnormal levels of other serum enzymes: Secondary | ICD-10-CM | POA: Diagnosis not present

## 2016-06-21 DIAGNOSIS — J181 Lobar pneumonia, unspecified organism: Secondary | ICD-10-CM | POA: Diagnosis not present

## 2016-06-21 DIAGNOSIS — R0602 Shortness of breath: Secondary | ICD-10-CM | POA: Diagnosis not present

## 2016-06-21 DIAGNOSIS — R112 Nausea with vomiting, unspecified: Secondary | ICD-10-CM | POA: Diagnosis not present

## 2016-06-21 DIAGNOSIS — J9601 Acute respiratory failure with hypoxia: Secondary | ICD-10-CM | POA: Diagnosis not present

## 2016-06-21 DIAGNOSIS — R197 Diarrhea, unspecified: Secondary | ICD-10-CM | POA: Diagnosis present

## 2016-06-21 DIAGNOSIS — Z79899 Other long term (current) drug therapy: Secondary | ICD-10-CM | POA: Diagnosis not present

## 2016-06-21 DIAGNOSIS — R05 Cough: Secondary | ICD-10-CM | POA: Diagnosis not present

## 2016-06-21 DIAGNOSIS — B9789 Other viral agents as the cause of diseases classified elsewhere: Secondary | ICD-10-CM | POA: Diagnosis not present

## 2016-06-21 DIAGNOSIS — Z8673 Personal history of transient ischemic attack (TIA), and cerebral infarction without residual deficits: Secondary | ICD-10-CM | POA: Diagnosis not present

## 2016-06-21 DIAGNOSIS — R1110 Vomiting, unspecified: Secondary | ICD-10-CM | POA: Diagnosis not present

## 2016-06-21 DIAGNOSIS — R7989 Other specified abnormal findings of blood chemistry: Secondary | ICD-10-CM

## 2016-06-21 DIAGNOSIS — J189 Pneumonia, unspecified organism: Secondary | ICD-10-CM | POA: Diagnosis present

## 2016-06-21 HISTORY — DX: Diaphragmatic hernia without obstruction or gangrene: K44.9

## 2016-06-21 HISTORY — DX: Migraine, unspecified, not intractable, without status migrainosus: G43.909

## 2016-06-21 HISTORY — DX: Pneumonia, unspecified organism: J18.9

## 2016-06-21 HISTORY — DX: Unspecified osteoarthritis, unspecified site: M19.90

## 2016-06-21 LAB — CBC
HCT: 44.3 % (ref 36.0–46.0)
HEMOGLOBIN: 14.8 g/dL (ref 12.0–15.0)
MCH: 28.8 pg (ref 26.0–34.0)
MCHC: 33.4 g/dL (ref 30.0–36.0)
MCV: 86.4 fL (ref 78.0–100.0)
PLATELETS: 299 10*3/uL (ref 150–400)
RBC: 5.13 MIL/uL — AB (ref 3.87–5.11)
RDW: 14.7 % (ref 11.5–15.5)
WBC: 13.1 10*3/uL — ABNORMAL HIGH (ref 4.0–10.5)

## 2016-06-21 LAB — OCCULT BLOOD GASTRIC / DUODENUM (SPECIMEN CUP): OCCULT BLOOD, GASTRIC: POSITIVE — AB

## 2016-06-21 LAB — I-STAT TROPONIN, ED: Troponin i, poc: 0 ng/mL (ref 0.00–0.08)

## 2016-06-21 MED ORDER — METOCLOPRAMIDE HCL 5 MG/ML IJ SOLN
10.0000 mg | Freq: Once | INTRAMUSCULAR | Status: AC
  Administered 2016-06-22: 10 mg via INTRAVENOUS
  Filled 2016-06-21: qty 2

## 2016-06-21 MED ORDER — ONDANSETRON HCL 4 MG/2ML IJ SOLN
4.0000 mg | Freq: Once | INTRAMUSCULAR | Status: AC
  Administered 2016-06-21: 4 mg via INTRAVENOUS
  Filled 2016-06-21: qty 2

## 2016-06-21 NOTE — ED Provider Notes (Signed)
[place] [facility] DEPT Provider Note   CSN: [id number] Arrival date & time: 06/21/16  2206     History   Chief Complaint No chief complaint on file.   HPI [name] is a 83 y.o. female.  83 yo woman with PMH transient ischemic attack, paroxysmal A. fib, hypertension and chronic cystitis presents with cough. Cough began last Tuesday, it was initially productive of white sputum. She saw her PCP who was concerned for pneumonia and prescribed levaquin. She is on day 6 of the Levaquin and productiveness of her cough has improved that she began to feel worse over the weekend. 2 days ago she developed chest tightness and soreness during her left breast radiates to her left shoulder and back. The pain is constant and becomes worse with coughing and deep breathing or chest wall tenderness. She is usually active but has been less so over the last week because she's been resting and fighting the pneumonia. She denies recent travel. She has also had diarrhea. Today after dinner she became nauseous with decreased appetite, has been vomiting since 6 PM. She's also been feeling diaphoretic. She denies myalgia. Has no known sick contacts but lives in a retirement facility.      Past Medical History:  Diagnosis Date  . Candidiasis of vulva and vagina   . Cystocele, midline   . Disorder of bone and cartilage, unspecified   . Fibroids   . Hypertension   . Other chronic cystitis   . Other sign and symptom in breast   . Paroxysmal atrial fibrillation ([place]) 11/30/2012  . Rectal incontinence   . TIA (transient ischemic attack)     Patient Active Problem List   Diagnosis Date Noted  . Paroxysmal atrial fibrillation ([place]) 11/30/2012  . Thoracic aorta atherosclerosis ([place]) 10/25/2012  . Essential hypertension, benign 09/29/2012  . TIA (transient ischemic attack) 08/26/2012  . H/O chronic cystitis 08/26/2012  . Hyperlipidemia with target LDL less than 130 06/29/2011  . Osteopenia 06/29/2011  .  Allergic rhinitis, mild 06/29/2011    Past Surgical History:  Procedure Laterality Date  . HYSTEROSCOPY W/D&C  03/16/1999  . TEE WITHOUT CARDIOVERSION N/A 11/06/2012   Procedure: TRANSESOPHAGEAL ECHOCARDIOGRAM (TEE);  Surgeon: Lelon Perla, MD;  Location: [facility] ENDOSCOPY;  Service: Cardiovascular;  Laterality: N/A;    OB History    No data available       Home Medications    Prior to Admission medications   Medication Sig Start Date End Date Taking? Authorizing Provider  azelastine (ASTELIN) 137 MCG/SPRAY nasal spray Place 1 spray into the nose 2 (two) times daily. Use in each nostril as directed 08/01/12   [name], MD  benzonatate (TESSALON) 100 MG capsule Take 2 capsules (200 mg total) by mouth 3 (three) times daily as needed for cough. 06/15/16   [name], MD  CALCIUM CARBONATE-VIT D-MIN PO Take 2 tablets by mouth daily.     Historical Provider, MD  CRANBERRY EXTRACT PO Take 1 capsule by mouth daily.     Historical Provider, MD  ELIQUIS 5 MG TABS tablet take 1 tablet by mouth twice a day 06/16/16   [name] [name], MD  glucosamine-chondroitin 500-400 MG tablet Take 1 tablet by mouth 3 (three) times daily.      Historical Provider, MD  hydrochlorothiazide (MICROZIDE) 12.5 MG capsule take 1 capsule by mouth once daily 04/13/16   [name], MD  levofloxacin (LEVAQUIN) 500 MG tablet Take 1 tablet (500 mg total) by mouth  daily. 06/15/16   [name], MD  loratadine (CLARITIN) 10 MG tablet Take 5 mg by mouth daily as needed for allergies.    Historical Provider, MD  metoprolol succinate (TOPROL-XL) 50 MG 24 hr tablet Take 1 tablet (50 mg total) by mouth daily. Take with or immediately following a meal. 01/15/16   [name], MD  Polyethyl Glycol-Propyl Glycol (SYSTANE OP) Place 1 drop into both eyes daily as needed (for dry eyes).    Historical Provider, MD  pravastatin (PRAVACHOL) 40 MG tablet take 1 tablet by mouth once daily 01/16/16   [name], MD    Family  History Family History  Problem Relation Age of Onset  . Hypertension Mother   . Heart disease Father     Social History Social History  Substance Use Topics  . Smoking status: Never Smoker  . Smokeless tobacco: Never Used  . Alcohol use 1.2 oz/week    2 Glasses of wine per week     Comment: 1-2 glasses of wine/week     Allergies   Lisinopril   Review of Systems Review of Systems  Constitutional: Positive for appetite change and fever.  Respiratory: Positive for cough and chest tightness. Negative for shortness of breath.   Cardiovascular: Negative for palpitations.  Gastrointestinal: Positive for diarrhea, nausea and vomiting. Negative for abdominal pain.  Musculoskeletal: Negative for myalgias.  All other systems reviewed and are negative.    Physical Exam Updated Vital Signs BP 176/73 (BP Location: Right Arm)   Pulse 71   Temp 98.1 F (36.7 C) (Oral)   Resp 18   Ht 5\' 6"  (1.676 m)   Wt 75.8 kg   SpO2 95%   BMI 26.95 kg/m   Physical Exam  Constitutional:  Ill appearing elder woman   HENT:  Head: Normocephalic and atraumatic.  Eyes: Conjunctivae are normal. No scleral icterus.  Cardiovascular: Normal rate and regular rhythm.   No murmur heard. Pulmonary/Chest: Effort normal. No respiratory distress. She has no wheezes. She exhibits tenderness.  Slight crackles over left lower lung base   Abdominal: Soft. Bowel sounds are normal. She exhibits no distension. There is no tenderness. There is no guarding.  Actively vomiting   Neurological: She is alert.  Skin: Skin is warm and dry. She is not diaphoretic.  Psychiatric: She has a normal mood and affect. Her behavior is normal.    ED Treatments / Results  Labs (all labs ordered are listed, but only abnormal results are displayed) Labs Reviewed - No data to display  EKG  EKG Interpretation None       Radiology No results found.  Procedures Procedures (including critical care  time)  Medications Ordered in ED Medications - No data to display   Initial Impression / Assessment and Plan / ED Course  I have reviewed the triage vital signs and the nursing notes.  Pertinent labs & imaging results that were available during my care of the patient were reviewed by me and considered in my medical decision making (see chart for details).  Clinical Course as of Jun 25 1929  Tue Jun 22, 2016  0207 Urinalysis without evidence of obvious UTI. Mild evidence of dehydration. Patient reports persistent nausea but has not had vomiting. She would like to drink. She was dosed a small dose of Phenergan. IV fluids still infusing.  [CH]    Clinical Course User Index [CH] Merryl Hacker, MD  83 yo woman with PMH TIA, paroxysmal A. fib,  hypertension, chronic cystitis presents with coughing, nausea and vomiting, and chest tightness. Wells score for PE = 0. Occult blood testing of her vomit was positive. Leukocyte count 13.1. POC troponin 0.0. Chest xray showed atelectasis of the left lung base with large hiatal hernia.   Ordered CMP, urinalysis, lipase. Admitted for intractable nausea and vomiting   Final Clinical Impressions(s) / ED Diagnoses   Final diagnoses:  None    New Prescriptions New Prescriptions   No medications on file     Ledell Noss, MD 06/25/16 1933    Sharlett Iles, MD 06/26/16 1500

## 2016-06-21 NOTE — ED Triage Notes (Signed)
Patient arrives via EMS from [facility]. Recent diagnosis of pneumonia and is currently on day 6 of 7 of a Levaquin course. Tonight patient consumed a steak dinner. Afterward patient became nauseated, started to vomit, and began complaining of chest pain. EMS gave Zofran 4mg  PTA.

## 2016-06-21 NOTE — ED Notes (Signed)
Patient transported to X-ray 

## 2016-06-22 ENCOUNTER — Emergency Department (HOSPITAL_COMMUNITY): Payer: Medicare Other

## 2016-06-22 ENCOUNTER — Observation Stay (HOSPITAL_COMMUNITY): Payer: Medicare Other

## 2016-06-22 ENCOUNTER — Encounter (HOSPITAL_COMMUNITY): Payer: Self-pay | Admitting: Internal Medicine

## 2016-06-22 DIAGNOSIS — K56609 Unspecified intestinal obstruction, unspecified as to partial versus complete obstruction: Secondary | ICD-10-CM | POA: Diagnosis not present

## 2016-06-22 DIAGNOSIS — Z79899 Other long term (current) drug therapy: Secondary | ICD-10-CM | POA: Diagnosis not present

## 2016-06-22 DIAGNOSIS — K3189 Other diseases of stomach and duodenum: Secondary | ICD-10-CM | POA: Diagnosis not present

## 2016-06-22 DIAGNOSIS — I480 Paroxysmal atrial fibrillation: Secondary | ICD-10-CM | POA: Diagnosis not present

## 2016-06-22 DIAGNOSIS — J181 Lobar pneumonia, unspecified organism: Secondary | ICD-10-CM | POA: Diagnosis not present

## 2016-06-22 DIAGNOSIS — K440 Diaphragmatic hernia with obstruction, without gangrene: Secondary | ICD-10-CM | POA: Diagnosis not present

## 2016-06-22 DIAGNOSIS — R079 Chest pain, unspecified: Secondary | ICD-10-CM | POA: Diagnosis present

## 2016-06-22 DIAGNOSIS — E876 Hypokalemia: Secondary | ICD-10-CM | POA: Diagnosis present

## 2016-06-22 DIAGNOSIS — R778 Other specified abnormalities of plasma proteins: Secondary | ICD-10-CM | POA: Diagnosis present

## 2016-06-22 DIAGNOSIS — J9601 Acute respiratory failure with hypoxia: Secondary | ICD-10-CM | POA: Diagnosis not present

## 2016-06-22 DIAGNOSIS — J069 Acute upper respiratory infection, unspecified: Secondary | ICD-10-CM | POA: Diagnosis present

## 2016-06-22 DIAGNOSIS — E0781 Sick-euthyroid syndrome: Secondary | ICD-10-CM | POA: Diagnosis present

## 2016-06-22 DIAGNOSIS — I700 Atherosclerosis of aorta: Secondary | ICD-10-CM | POA: Diagnosis present

## 2016-06-22 DIAGNOSIS — R1110 Vomiting, unspecified: Secondary | ICD-10-CM | POA: Diagnosis not present

## 2016-06-22 DIAGNOSIS — R112 Nausea with vomiting, unspecified: Secondary | ICD-10-CM | POA: Diagnosis not present

## 2016-06-22 DIAGNOSIS — K449 Diaphragmatic hernia without obstruction or gangrene: Secondary | ICD-10-CM | POA: Diagnosis present

## 2016-06-22 DIAGNOSIS — I10 Essential (primary) hypertension: Secondary | ICD-10-CM | POA: Diagnosis not present

## 2016-06-22 DIAGNOSIS — R748 Abnormal levels of other serum enzymes: Secondary | ICD-10-CM | POA: Diagnosis not present

## 2016-06-22 DIAGNOSIS — E785 Hyperlipidemia, unspecified: Secondary | ICD-10-CM | POA: Diagnosis not present

## 2016-06-22 DIAGNOSIS — Y92230 Patient room in hospital as the place of occurrence of the external cause: Secondary | ICD-10-CM | POA: Diagnosis not present

## 2016-06-22 DIAGNOSIS — R0602 Shortness of breath: Secondary | ICD-10-CM | POA: Diagnosis not present

## 2016-06-22 DIAGNOSIS — R109 Unspecified abdominal pain: Secondary | ICD-10-CM | POA: Diagnosis not present

## 2016-06-22 DIAGNOSIS — I110 Hypertensive heart disease with heart failure: Secondary | ICD-10-CM | POA: Diagnosis present

## 2016-06-22 DIAGNOSIS — I4891 Unspecified atrial fibrillation: Secondary | ICD-10-CM | POA: Diagnosis not present

## 2016-06-22 DIAGNOSIS — R946 Abnormal results of thyroid function studies: Secondary | ICD-10-CM | POA: Diagnosis not present

## 2016-06-22 DIAGNOSIS — K760 Fatty (change of) liver, not elsewhere classified: Secondary | ICD-10-CM | POA: Diagnosis not present

## 2016-06-22 DIAGNOSIS — Z7901 Long term (current) use of anticoagulants: Secondary | ICD-10-CM | POA: Diagnosis not present

## 2016-06-22 DIAGNOSIS — I5033 Acute on chronic diastolic (congestive) heart failure: Secondary | ICD-10-CM | POA: Diagnosis not present

## 2016-06-22 DIAGNOSIS — T501X5A Adverse effect of loop [high-ceiling] diuretics, initial encounter: Secondary | ICD-10-CM | POA: Diagnosis not present

## 2016-06-22 DIAGNOSIS — B9789 Other viral agents as the cause of diseases classified elsewhere: Secondary | ICD-10-CM | POA: Diagnosis not present

## 2016-06-22 DIAGNOSIS — I5031 Acute diastolic (congestive) heart failure: Secondary | ICD-10-CM | POA: Diagnosis not present

## 2016-06-22 DIAGNOSIS — R197 Diarrhea, unspecified: Secondary | ICD-10-CM | POA: Diagnosis present

## 2016-06-22 DIAGNOSIS — I481 Persistent atrial fibrillation: Secondary | ICD-10-CM | POA: Diagnosis not present

## 2016-06-22 DIAGNOSIS — J690 Pneumonitis due to inhalation of food and vomit: Secondary | ICD-10-CM | POA: Diagnosis not present

## 2016-06-22 DIAGNOSIS — E860 Dehydration: Secondary | ICD-10-CM | POA: Diagnosis present

## 2016-06-22 DIAGNOSIS — I509 Heart failure, unspecified: Secondary | ICD-10-CM | POA: Diagnosis not present

## 2016-06-22 DIAGNOSIS — Z8673 Personal history of transient ischemic attack (TIA), and cerebral infarction without residual deficits: Secondary | ICD-10-CM | POA: Diagnosis not present

## 2016-06-22 DIAGNOSIS — E8770 Fluid overload, unspecified: Secondary | ICD-10-CM | POA: Diagnosis not present

## 2016-06-22 LAB — URINALYSIS, ROUTINE W REFLEX MICROSCOPIC
BILIRUBIN URINE: NEGATIVE
Glucose, UA: NEGATIVE mg/dL
Hgb urine dipstick: NEGATIVE
Ketones, ur: 20 mg/dL — AB
Nitrite: NEGATIVE
PH: 5 (ref 5.0–8.0)
Protein, ur: NEGATIVE mg/dL
SPECIFIC GRAVITY, URINE: 1.021 (ref 1.005–1.030)

## 2016-06-22 LAB — LIPASE, BLOOD
LIPASE: 18 U/L (ref 11–51)
LIPASE: 22 U/L (ref 11–51)

## 2016-06-22 LAB — COMPREHENSIVE METABOLIC PANEL
ALT: 18 U/L (ref 14–54)
AST: 22 U/L (ref 15–41)
Albumin: 3.8 g/dL (ref 3.5–5.0)
Alkaline Phosphatase: 46 U/L (ref 38–126)
Anion gap: 14 (ref 5–15)
BILIRUBIN TOTAL: 0.6 mg/dL (ref 0.3–1.2)
BUN: 17 mg/dL (ref 6–20)
CHLORIDE: 98 mmol/L — AB (ref 101–111)
CO2: 27 mmol/L (ref 22–32)
Calcium: 9.3 mg/dL (ref 8.9–10.3)
Creatinine, Ser: 0.98 mg/dL (ref 0.44–1.00)
GFR calc non Af Amer: 52 mL/min — ABNORMAL LOW (ref >60)
GFR, EST AFRICAN AMERICAN: 60 mL/min — AB (ref >60)
Glucose, Bld: 182 mg/dL — ABNORMAL HIGH (ref 65–99)
POTASSIUM: 3.2 mmol/L — AB (ref 3.5–5.1)
Sodium: 139 mmol/L (ref 135–145)
TOTAL PROTEIN: 7 g/dL (ref 6.5–8.1)

## 2016-06-22 LAB — TROPONIN I
Troponin I: <0.03 ng/mL (ref <0.03)
Troponin I: <0.03 ng/mL (ref <0.03)
Troponin I: <0.03 ng/mL (ref <0.03)

## 2016-06-22 LAB — HEPARIN LEVEL (UNFRACTIONATED): Heparin Unfractionated: 2.18 IU/mL — ABNORMAL HIGH (ref 0.30–0.70)

## 2016-06-22 LAB — HEPATIC FUNCTION PANEL
ALBUMIN: 3.7 g/dL (ref 3.5–5.0)
ALK PHOS: 44 U/L (ref 38–126)
ALT: 17 U/L (ref 14–54)
AST: 31 U/L (ref 15–41)
BILIRUBIN TOTAL: 0.6 mg/dL (ref 0.3–1.2)
Bilirubin, Direct: 0.2 mg/dL (ref 0.1–0.5)
Indirect Bilirubin: 0.4 mg/dL (ref 0.3–0.9)
TOTAL PROTEIN: 7.3 g/dL (ref 6.5–8.1)

## 2016-06-22 LAB — APTT
APTT: 31 s (ref 24–36)
APTT: 34 s (ref 24–36)

## 2016-06-22 LAB — PROTIME-INR
INR: 1.31
Prothrombin Time: 16.3 seconds — ABNORMAL HIGH (ref 11.4–15.2)

## 2016-06-22 LAB — MAGNESIUM: MAGNESIUM: 1.8 mg/dL (ref 1.7–2.4)

## 2016-06-22 LAB — INFLUENZA PANEL BY PCR (TYPE A & B)
INFLBPCR: NEGATIVE
Influenza A By PCR: NEGATIVE

## 2016-06-22 MED ORDER — LACTATED RINGERS IV BOLUS (SEPSIS)
1000.0000 mL | Freq: Once | INTRAVENOUS | Status: AC
  Administered 2016-06-22: 1000 mL via INTRAVENOUS

## 2016-06-22 MED ORDER — PANTOPRAZOLE SODIUM 40 MG IV SOLR
40.0000 mg | Freq: Once | INTRAVENOUS | Status: AC
  Administered 2016-06-22: 40 mg via INTRAVENOUS
  Filled 2016-06-22: qty 40

## 2016-06-22 MED ORDER — HYDRALAZINE HCL 20 MG/ML IJ SOLN: 10.0000 mg | INTRAMUSCULAR | Status: DC | PRN

## 2016-06-22 MED ORDER — SODIUM CHLORIDE 0.9 % IV SOLN
30.0000 meq | Freq: Once | INTRAVENOUS | Status: AC
  Administered 2016-06-22: 30 meq via INTRAVENOUS
  Filled 2016-06-22: qty 15

## 2016-06-22 MED ORDER — PROMETHAZINE HCL 25 MG/ML IJ SOLN
6.2500 mg | Freq: Once | INTRAMUSCULAR | Status: AC
  Administered 2016-06-22: 6.25 mg via INTRAVENOUS
  Filled 2016-06-22: qty 1

## 2016-06-22 MED ORDER — ONDANSETRON HCL 4 MG/2ML IJ SOLN
4.0000 mg | Freq: Once | INTRAMUSCULAR | Status: AC
  Administered 2016-06-22: 4 mg via INTRAVENOUS
  Filled 2016-06-22: qty 2

## 2016-06-22 MED ORDER — HEPARIN (PORCINE) IN NACL 100-0.45 UNIT/ML-% IJ SOLN
1150.0000 [IU]/h | INTRAMUSCULAR | Status: DC
  Administered 2016-06-22: 950 [IU]/h via INTRAVENOUS
  Administered 2016-06-23: 1150 [IU]/h via INTRAVENOUS
  Filled 2016-06-22 (×2): qty 250

## 2016-06-22 MED ORDER — ACETAMINOPHEN 325 MG PO TABS
650.0000 mg | ORAL_TABLET | Freq: Four times a day (QID) | ORAL | Status: DC | PRN
  Administered 2016-06-23 – 2016-07-01 (×7): 650 mg via ORAL
  Filled 2016-06-22 (×7): qty 2

## 2016-06-22 MED ORDER — ONDANSETRON HCL 4 MG/2ML IJ SOLN
4.0000 mg | Freq: Four times a day (QID) | INTRAMUSCULAR | Status: DC | PRN
  Administered 2016-06-22: 4 mg via INTRAVENOUS
  Filled 2016-06-22 (×2): qty 2

## 2016-06-22 MED ORDER — SODIUM CHLORIDE 0.9 % IV SOLN
INTRAVENOUS | Status: AC
  Administered 2016-06-22 – 2016-06-23 (×3): via INTRAVENOUS

## 2016-06-22 MED ORDER — ACETAMINOPHEN 650 MG RE SUPP: 650.0000 mg | Freq: Four times a day (QID) | RECTAL | Status: DC | PRN

## 2016-06-22 MED ORDER — ONDANSETRON HCL 4 MG PO TABS: 4.0000 mg | ORAL_TABLET | Freq: Four times a day (QID) | ORAL | Status: DC | PRN

## 2016-06-22 MED ORDER — IOPAMIDOL (ISOVUE-300) INJECTION 61%
INTRAVENOUS | Status: AC
  Administered 2016-06-22: 100 mL
  Filled 2016-06-22: qty 100

## 2016-06-22 MED ORDER — METOPROLOL TARTRATE 5 MG/5ML IV SOLN
5.0000 mg | Freq: Four times a day (QID) | INTRAVENOUS | Status: DC
  Administered 2016-06-22 – 2016-06-24 (×7): 5 mg via INTRAVENOUS
  Filled 2016-06-22 (×7): qty 5

## 2016-06-22 MED ORDER — PANTOPRAZOLE SODIUM 40 MG IV SOLR
40.0000 mg | INTRAVENOUS | Status: DC
  Administered 2016-06-22 – 2016-06-24 (×3): 40 mg via INTRAVENOUS
  Filled 2016-06-22 (×3): qty 40

## 2016-06-22 NOTE — ED Notes (Signed)
Pt had one episode of emesis

## 2016-06-22 NOTE — Care Management Note (Signed)
Case Management Note  Patient Details  Name: [name] MRN: [id number] Date of Birth: 1930-06-20  Subjective/Objective:    Patient presents with N/V, chest pain, hx of paroxysmal atrial fibrillation, hypertension, TIA . From [facility] facility with husband. Independent with ADL's , no DME usage PTA.  EISLEY JACOBI (Spouse)      [phone]      PCP: Jill Alexanders  Action/Plan: Plan is to d/c to home when medically stable. CM to f/u with disposition needs.  Expected Discharge Date:                  Expected Discharge Plan:  Home/Self Care  In-House Referral:     Discharge planning Services  CM Consult  Post Acute Care Choice:    Choice offered to:     DME Arranged:    DME Agency:     HH Arranged:    HH Agency:     Status of Service:  In process, will continue to follow  If discussed at Long Length of Stay Meetings, dates discussed:    Additional Comments:  Sharin Mons, RN 06/22/2016, 4:20 PM

## 2016-06-22 NOTE — Consult Note (Signed)
[facility] Surgery Consult/Admission Note  [name] 28-Jun-1930  [id number].    Requesting MD: Dr. Irine Seal Chief Complaint/Reason for Consult: Abdominal pain, nausea and vomiting  HPI:   Pt is a 83 y.o. female with history of paroxysmal atrial fibrillation, hypertension, TIA on eliquis (last dose yesterday evening) who presented to the [facility] ED with complaints of uncontrolled vomiting and left sided chest pain. Pt states she was diagnosed with pneumonia last week and was taking levoquin. She states cold like symptoms that persisted with worsening cough. Pt states she had not eaten much until last night. Roughly an hour and a half after dinner she began vomiting. Husband at bedside. He stated the vomiting was uncontrolled and no wreching was associated with it. He states it just poured out of her mouth and it was black in color. Some episodes had undigested food in the vomit. She states for the last 3-4 days she was having left sided chest discomfort that radiated into her left shoulder. Once she began vomiting she was having mild non-radiating, left sided and epigastric pain that was intermittent. Nothing made it better. Pt denies diarrhea, constipation, fever, chills, SOB, dizziness, LOC.   Pt has had a large BM and + flatus since arrival to the ED. Currently no vomiting or abdominal pain.  ED Course: VSS Labs: Potassium 3.2, GFR 52, WBC 13.1 Dg Esophagus: Contrast is obstructed at the level of the stomach cardia. There is an associated delay in the passage of contrast from the lower esophagus to the stomach. Stomach appears to be nearly completely field with ingested materials blocking the flow of contrast. Findings suggest either proximal small bowel obstruction or gastric outlet obstruction. Findings on today's exam, combined with the appearance on earlier chest x-ray, also raise the possibility of paraesophageal hiatal hernia or gastric volvulus.  Ct abd/pel: Large intrathoracic  gastric hernia, only partially visualized on this CT abdomen study. Intrathoracic stomach is only mildly distended and barium contrast progresses to the colon. No wall thickening or pneumatosis in the visualized stomach. Findings most likely represent spontaneous interval reduction of a gastric volvulus. No evidence of gastric outlet obstruction or small bowel obstruction on this CT study. A follow-up upper GI could be obtained on a short-term basis as clinically warranted.  ROS:  Review of Systems  Constitutional: Negative for chills, diaphoresis and fever.  HENT: Positive for congestion and sore throat.   Eyes: Negative for pain and discharge.  Respiratory: Positive for cough. Negative for hemoptysis and shortness of breath.   Cardiovascular: Positive for chest pain. Negative for palpitations and leg swelling.  Gastrointestinal: Positive for abdominal pain, nausea and vomiting. Negative for blood in stool, constipation and diarrhea.  Genitourinary: Negative for dysuria and hematuria.  Skin: Negative for itching and rash.  Neurological: Negative for dizziness, loss of consciousness and headaches.  All other systems reviewed and are negative.    Family History  Problem Relation Age of Onset  . Hypertension Mother   . Heart disease Father     Past Medical History:  Diagnosis Date  . Candidiasis of vulva and vagina   . Cystocele, midline   . Disorder of bone and cartilage, unspecified   . Fibroids   . Hypertension   . Other chronic cystitis   . Other sign and symptom in breast   . Paroxysmal atrial fibrillation ([place]) 11/30/2012  . Rectal incontinence   . TIA (transient ischemic attack)     Past Surgical History:  Procedure Laterality Date  .  HYSTEROSCOPY W/D&C  03/16/1999  . TEE WITHOUT CARDIOVERSION N/A 11/06/2012   Procedure: TRANSESOPHAGEAL ECHOCARDIOGRAM (TEE);  Surgeon: Lelon Perla, MD;  Location: [facility] ENDOSCOPY;  Service: Cardiovascular;  Laterality: N/A;    Social  History:  reports that she has never smoked. She has never used smokeless tobacco. She reports that she drinks about 1.2 oz of alcohol per week . She reports that she does not use drugs.  Allergies:  Allergies  Allergen Reactions  . Lisinopril Other (See Comments)    DIZZY    Medications Prior to Admission  Medication Sig Dispense Refill  . azelastine (ASTELIN) 137 MCG/SPRAY nasal spray Place 1 spray into the nose 2 (two) times daily. Use in each nostril as directed (Patient taking differently: Place 1 spray into the nose 2 (two) times daily as needed for rhinitis or allergies. Use in each nostril as directed) 30 mL 11  . benzonatate (TESSALON) 100 MG capsule Take 2 capsules (200 mg total) by mouth 3 (three) times daily as needed for cough. 30 capsule 0  . CALCIUM CARBONATE-VIT D-MIN PO Take 2 tablets by mouth daily.     Marland Kitchen CRANBERRY EXTRACT PO Take 1 capsule by mouth daily.     Marland Kitchen ELIQUIS 5 MG TABS tablet take 1 tablet by mouth twice a day 60 tablet 1  . glucosamine-chondroitin 500-400 MG tablet Take 1 tablet by mouth 3 (three) times daily.      . hydrochlorothiazide (MICROZIDE) 12.5 MG capsule take 1 capsule by mouth once daily 90 capsule 2  . loratadine (CLARITIN) 10 MG tablet Take 5 mg by mouth daily as needed for allergies.    . metoprolol succinate (TOPROL-XL) 50 MG 24 hr tablet Take 1 tablet (50 mg total) by mouth daily. Take with or immediately following a meal. 90 tablet 3  . Polyethyl Glycol-Propyl Glycol (SYSTANE OP) Place 1 drop into both eyes daily as needed (for dry eyes).    . pravastatin (PRAVACHOL) 40 MG tablet take 1 tablet by mouth once daily 30 tablet 11    Blood pressure 136/64, pulse 76, temperature 98.1 F (36.7 C), temperature source Oral, resp. rate 20, height _0  (1.676 m), weight 167 lb (75.8 kg), SpO2 97 %.  Physical Exam  Constitutional: She is oriented to person, place, and time and well-developed, well-nourished, and in no distress. Vital signs are normal.  No distress.  HENT:  Head: Normocephalic and atraumatic.  Nose: Nose normal.  Eyes: Conjunctivae are normal. Right eye exhibits no discharge. Left eye exhibits no discharge. No scleral icterus.  Neck: Normal range of motion. Neck supple.  Cardiovascular: Normal rate, regular rhythm, normal heart sounds and intact distal pulses.  Exam reveals no gallop and no friction rub.   No murmur heard. Pulses:      Radial pulses are 2+ on the right side, and 2+ on the left side.       Dorsalis pedis pulses are 2+ on the right side, and 2+ on the left side.  Pulmonary/Chest: Effort normal. No respiratory distress. She has no decreased breath sounds. She has no wheezes. She has no rhonchi. She has rales in the left middle field and the left lower field.  Abdominal: Soft. Normal appearance and bowel sounds are normal. She exhibits no distension and no mass. There is tenderness in the epigastric area. There is no rigidity, no rebound and no guarding.  Musculoskeletal: Normal range of motion. She exhibits no edema or deformity.  Neurological: She is alert and oriented to  person, place, and time.  Skin: Skin is warm and dry. No rash noted. She is not diaphoretic.  Psychiatric: Mood and affect normal.  Nursing note and vitals reviewed.   Results for orders placed or performed during the hospital encounter of 06/21/16 (from the past 48 hour(s))  Occult bld gastric/duodenum (cup to lab)     Status: Abnormal   Collection Time: 06/21/16 11:24 PM  Result Value Ref Range   pH, Gastric NOT DONE    Occult Blood, Gastric POSITIVE (A) NEGATIVE  Comprehensive metabolic panel     Status: Abnormal   Collection Time: 06/21/16 11:28 PM  Result Value Ref Range   Sodium 139 135 - 145 mmol/L   Potassium 3.2 (L) 3.5 - 5.1 mmol/L   Chloride 98 (L) 101 - 111 mmol/L   CO2 27 22 - 32 mmol/L   Glucose, Bld 182 (H) 65 - 99 mg/dL   BUN 17 6 - 20 mg/dL   Creatinine, Ser 0.98 0.44 - 1.00 mg/dL   Calcium 9.3 8.9 - 10.3 mg/dL    Total Protein 7.0 6.5 - 8.1 g/dL   Albumin 3.8 3.5 - 5.0 g/dL   AST 22 15 - 41 U/L   ALT 18 14 - 54 U/L   Alkaline Phosphatase 46 38 - 126 U/L   Total Bilirubin 0.6 0.3 - 1.2 mg/dL   GFR calc non Af Amer 52 (L) >60 mL/min   GFR calc Af Amer 60 (L) >60 mL/min    Comment: (NOTE) The eGFR has been calculated using the CKD EPI equation. This calculation has not been validated in all clinical situations. eGFR's persistently <60 mL/min signify possible Chronic Kidney Disease.    Anion gap 14 5 - 15  Lipase, blood     Status: None   Collection Time: 06/21/16 11:28 PM  Result Value Ref Range   Lipase 18 11 - 51 U/L  CBC     Status: Abnormal   Collection Time: 06/21/16 11:28 PM  Result Value Ref Range   WBC 13.1 (H) 4.0 - 10.5 K/uL   RBC 5.13 (H) 3.87 - 5.11 MIL/uL   Hemoglobin 14.8 12.0 - 15.0 g/dL   HCT 44.3 36.0 - 46.0 %   MCV 86.4 78.0 - 100.0 fL   MCH 28.8 26.0 - 34.0 pg   MCHC 33.4 30.0 - 36.0 g/dL   RDW 14.7 11.5 - 15.5 %   Platelets 299 150 - 400 K/uL  I-stat troponin, ED     Status: None   Collection Time: 06/21/16 11:37 PM  Result Value Ref Range   Troponin i, poc 0.00 0.00 - 0.08 ng/mL   Comment 3            Comment: Due to the release kinetics of cTnI, a negative result within the first hours of the onset of symptoms does not rule out myocardial infarction with certainty. If myocardial infarction is still suspected, repeat the test at appropriate intervals.   Urinalysis, Routine w reflex microscopic     Status: Abnormal   Collection Time: 06/22/16 12:50 AM  Result Value Ref Range   Color, Urine YELLOW YELLOW   APPearance HAZY (A) CLEAR   Specific Gravity, Urine 1.021 1.005 - 1.030   pH 5.0 5.0 - 8.0   Glucose, UA NEGATIVE NEGATIVE mg/dL   Hgb urine dipstick NEGATIVE NEGATIVE   Bilirubin Urine NEGATIVE NEGATIVE   Ketones, ur 20 (A) NEGATIVE mg/dL   Protein, ur NEGATIVE NEGATIVE mg/dL   Nitrite NEGATIVE NEGATIVE  Leukocytes, UA SMALL (A) NEGATIVE   RBC / HPF  0-5 0 - 5 RBC/hpf   WBC, UA 6-30 0 - 5 WBC/hpf   Bacteria, UA RARE (A) NONE SEEN   Squamous Epithelial / LPF 6-30 (A) NONE SEEN   Mucous PRESENT    Hyaline Casts, UA PRESENT   Influenza panel by PCR (type A & B)     Status: None   Collection Time: 06/22/16  6:24 AM  Result Value Ref Range   Influenza A By PCR NEGATIVE NEGATIVE   Influenza B By PCR NEGATIVE NEGATIVE    Comment: (NOTE) The Xpert Xpress Flu assay is intended as an aid in the diagnosis of  influenza and should not be used as a sole basis for treatment.  This  assay is FDA approved for nasopharyngeal swab specimens only. Nasal  washings and aspirates are unacceptable for Xpert Xpress Flu testing.   Troponin I (q 6hr x 3)     Status: None   Collection Time: 06/22/16  7:53 AM  Result Value Ref Range   Troponin I <0.03 <0.03 ng/mL  Hepatic function panel     Status: None   Collection Time: 06/22/16  7:53 AM  Result Value Ref Range   Total Protein 7.3 6.5 - 8.1 g/dL   Albumin 3.7 3.5 - 5.0 g/dL   AST 31 15 - 41 U/L   ALT 17 14 - 54 U/L   Alkaline Phosphatase 44 38 - 126 U/L   Total Bilirubin 0.6 0.3 - 1.2 mg/dL   Bilirubin, Direct 0.2 0.1 - 0.5 mg/dL   Indirect Bilirubin 0.4 0.3 - 0.9 mg/dL  Lipase, blood     Status: None   Collection Time: 06/22/16  7:53 AM  Result Value Ref Range   Lipase 22 11 - 51 U/L  Heparin level (unfractionated)     Status: Abnormal   Collection Time: 06/22/16  7:53 AM  Result Value Ref Range   Heparin Unfractionated >2.20 (H) 0.30 - 0.70 IU/mL    Comment: RESULTS CONFIRMED BY MANUAL DILUTION        IF HEPARIN RESULTS ARE BELOW EXPECTED VALUES, AND PATIENT DOSAGE HAS BEEN CONFIRMED, SUGGEST FOLLOW UP TESTING OF ANTITHROMBIN III LEVELS.   Protime-INR     Status: Abnormal   Collection Time: 06/22/16  7:53 AM  Result Value Ref Range   Prothrombin Time 16.3 (H) 11.4 - 15.2 seconds   INR 1.31   APTT     Status: None   Collection Time: 06/22/16  7:53 AM  Result Value Ref Range   aPTT  31 24 - 36 seconds   Dg Chest 2 View  Result Date: 06/21/2016 CLINICAL DATA:  Cough and vomiting EXAM: CHEST  2 VIEW COMPARISON:  None. FINDINGS: Low lung volumes. Streaky atelectasis left lung base. No effusion. Mild cardiomegaly with atherosclerosis. Moderate to large hiatal hernia. No pneumothorax. IMPRESSION: 1. Streaky atelectasis at the left lung base 2. Moderate to large hiatal hernia Electronically Signed   By: Donavan Foil M.D.   On: 06/21/2016 23:59   Dg Abd 1 View  Result Date: 06/22/2016 CLINICAL DATA:  All nausea and vomiting after a steak dinner tonight. Chest pain. EXAM: ABDOMEN - 1 VIEW COMPARISON:  None. FINDINGS: Stool and air throughout the colon. No radiographic evidence of bowel obstruction or perforation. No biliary or urinary calculi are evident. Marked curvature and degenerative change of the lumbar spine. IMPRESSION: Negative for obstruction or perforation. Electronically Signed   By: Andreas Newport  M.D.   On: 06/22/2016 03:47   US Abdomen Complete  Result Date: 06/22/2016 CLINICAL DATA:  Postprandial chest pain last night EXAM: ABDOMEN ULTRASOUND COMPLETE COMPARISON:  None. FINDINGS: Gallbladder: No gallstones or wall thickening visualized. No sonographic Murphy sign noted by sonographer. Common bile duct: Diameter: 4 mm Liver: Diffuse parenchymal echogenicity without focal lesion. IVC: No abnormality visualized. Pancreas: Visualized portion unremarkable. Spleen: Size and appearance within normal limits. Right Kidney: Length: 10.4 cm. Echogenicity within normal limits. No mass or hydronephrosis visualized. Left Kidney: Length: 11.7 cm. Echogenicity within normal limits. No mass or hydronephrosis visualized. Abdominal aorta: Limited visibility. Visible portions are normal in caliber. Other findings: None. IMPRESSION: Study limited due to bowel gas and hepatic echogenicity. The diffuse hepatic echogenicity likely represents fatty infiltration. Normal gallbladder and bile ducts.  Electronically Signed   By: Andreas Newport M.D.   On: 06/22/2016 06:06   Ct Abdomen Pelvis W Contrast  Result Date: 06/22/2016 CLINICAL DATA:  Vomiting for 2 days. Abdominal pain. Clinical concern for gastric outlet obstruction on upper GI from earlier today. EXAM: CT ABDOMEN AND PELVIS WITH CONTRAST TECHNIQUE: Multidetector CT imaging of the abdomen and pelvis was performed using the standard protocol following bolus administration of intravenous contrast. CONTRAST:  100 cc ISOVUE-300 IOPAMIDOL (ISOVUE-300) INJECTION 61% COMPARISON:  Upper GI and abdominal radiograph from earlier today. FINDINGS: Visualization of the upper abdomen is limited by streak artifact from barium in the stomach. Lower chest: Trace bilateral dependent pleural effusions. Compressive atelectasis in the medial and dependent lower lobes bilaterally. Hepatobiliary: Diffuse hepatic steatosis. Normal liver size. No liver mass. Normal gallbladder with no radiopaque cholelithiasis. No biliary ductal dilatation. Pancreas: Normal, with no mass or duct dilation. Spleen: Normal size. No mass. Adrenals/Urinary Tract: Normal adrenals. No hydronephrosis. Mild segmental renal cortical scarring in the posterior upper right kidney. No renal mass. Normal bladder. Stomach/Bowel: There is a large intrathoracic gastric hernia, which is only partially visualized on this CT abdomen study. There is retained barium contrast and food debris in the mildly distended visualized intrathoracic portion of the stomach. No gastric wall thickening or pneumatosis is seen in the visualized stomach. The distal intra- abdominal portion of the stomach is collapsed and grossly normal. The duodenum is relatively collapsed and normal. Normal caliber small bowel with no small bowel wall thickening. Oral contrast progresses to the colon. Normal appendix. Marked sigmoid diverticulosis, with no large bowel wall thickening or pericolonic fat stranding. Vascular/Lymphatic:  Atherosclerotic nonaneurysmal abdominal aorta. Patent portal, splenic, hepatic and renal veins. No pathologically enlarged lymph nodes in the abdomen or pelvis. Reproductive: Status post hysterectomy, with no abnormal findings at the vaginal cuff. No adnexal mass. Other: No pneumoperitoneum, ascites or focal fluid collection. Musculoskeletal: No aggressive appearing focal osseous lesions. Marked thoracolumbar spondylosis with mild to moderate levocurvature of the thoracolumbar spine . IMPRESSION: 1. Large intrathoracic gastric hernia, only partially visualized on this CT abdomen study. Intrathoracic stomach is only mildly distended and barium contrast progresses to the colon. No wall thickening or pneumatosis in the visualized stomach. Findings most likely represent spontaneous interval reduction of a gastric volvulus. No evidence of gastric outlet obstruction or small bowel obstruction on this CT study. A follow-up upper GI could be obtained on a short-term basis as clinically warranted. 2. Trace bilateral dependent pleural effusions with bibasilar atelectasis . 3. Additional findings include aortic atherosclerosis, diffuse hepatic steatosis and marked sigmoid diverticulosis. Electronically Signed   By: Ilona Sorrel M.D.   On: 06/22/2016 14:23   Dg Esophagus  Result Date: 06/22/2016 CLINICAL DATA:  Persistent vomiting. Impaction? UNABLE TO SWALLOW ANYTHING SINCE YESTERDAY VOMITING, REGURITATION EXAM: ESOPHOGRAM/BARIUM SWALLOW TECHNIQUE: Single contrast examination was performed using thin barium or water soluble. FLUOROSCOPY TIME:  Fluoroscopy Time:  3 minutes and 6 seconds Number of Acquired Spot Images: 2 COMPARISON:  Chest x-ray from earlier same day. FINDINGS: Barium contrast material was administered orally during fluoroscopic examination. There is prompt flow of the contrast to the level of the distal esophagus. There is a very delayed flow of the contrast from the distal esophagus to the stomach. The  stomach appears distended with ingested materials blocking flow of the contrast material beyond the stomach cardia. Findings on today's exam, combined with the appearance on earlier chest x-ray, raises the possibility of large paraesophageal hiatal hernia or gastric volvulus. IMPRESSION: Contrast is obstructed at the level of the stomach cardia. There is an associated delay in the passage of contrast from the lower esophagus to the stomach. Stomach appears to be nearly completely field with ingested materials blocking the flow of contrast. Findings suggest either proximal small bowel obstruction or gastric outlet obstruction. Findings on today's exam, combined with the appearance on earlier chest x-ray, also raise the possibility of paraesophageal hiatal hernia or gastric volvulus. Recommend CT abdomen and pelvis for further characterization. These results were called by telephone at the time of interpretation on 06/22/2016 at 9:14 am to Dr. Grandville Silos, who verbally acknowledged these results. Electronically Signed   By: Franki Cabot M.D.   On: 06/22/2016 09:15      Assessment/Plan  Large Intrathoracic Gastric Hernia - contrast in colon, pt not obstructed - recommend bowel rest with NPO or clear liquids - no surgical intervention at this time   We will continue to follow. Thank you for the consult.  Kalman Drape, [facility] 06/22/2016, 3:00 PM Pager: [phone] Consults: [phone] Mon-Fri 7:00 am-4:30 pm Sat-Sun 7:00 am-11:30 am

## 2016-06-22 NOTE — ED Notes (Signed)
Patient transported to CT 

## 2016-06-22 NOTE — ED Notes (Signed)
Patient transported to Ultrasound 

## 2016-06-22 NOTE — H&P (Signed)
History and Physical    [name] [id number] DOB: March 15, 1931 DOA: 06/21/2016  PCP: Wyatt Haste, MD  Patient coming from: Home.  Chief Complaint: Nausea vomiting and chest pain.  HPI: [name] is a 83 y.o. female with history of paroxysmal atrial fibrillation, hypertension, TIA versus to the ER because of persistent nausea vomiting with chest pain since last evening 6 PM. Patient states she was diagnosed with pneumonia last week and had completed a one-week course of Levaquin, last dose was yesterday. Last evening after having steak half an hour later patient started having nausea vomiting with left upper quadrant abdominal pain with chest pain radiating to the left shoulder. Patient had multiple episodes of vomiting. 2 days ago patient had mild diarrhea.   ED Course: In the ER EKG shows normal sinus rhythm with chest x-ray and KUB being unremarkable. Sonogram of the abdomen does not show any gallstones. Patient still persistently has nausea vomiting. Will be admitted for further observation. Presently chest pain-free.  Review of Systems: As per HPI, rest all negative.   Past Medical History:  Diagnosis Date  . Candidiasis of vulva and vagina   . Cystocele, midline   . Disorder of bone and cartilage, unspecified   . Fibroids   . Hypertension   . Other chronic cystitis   . Other sign and symptom in breast   . Paroxysmal atrial fibrillation ([place]) 11/30/2012  . Rectal incontinence   . TIA (transient ischemic attack)     Past Surgical History:  Procedure Laterality Date  . HYSTEROSCOPY W/D&C  03/16/1999  . TEE WITHOUT CARDIOVERSION N/A 11/06/2012   Procedure: TRANSESOPHAGEAL ECHOCARDIOGRAM (TEE);  Surgeon: Lelon Perla, MD;  Location: [facility] ENDOSCOPY;  Service: Cardiovascular;  Laterality: N/A;     reports that she has never smoked. She has never used smokeless tobacco. She reports that she drinks about 1.2 oz of alcohol per week . She reports that she does not  use drugs.  Allergies  Allergen Reactions  . Lisinopril Other (See Comments)    DIZZY    Family History  Problem Relation Age of Onset  . Hypertension Mother   . Heart disease Father     Prior to Admission medications   Medication Sig Start Date End Date Taking? Authorizing Provider  azelastine (ASTELIN) 137 MCG/SPRAY nasal spray Place 1 spray into the nose 2 (two) times daily. Use in each nostril as directed Patient taking differently: Place 1 spray into the nose 2 (two) times daily as needed for rhinitis or allergies. Use in each nostril as directed 08/01/12  Yes [name], MD  benzonatate (TESSALON) 100 MG capsule Take 2 capsules (200 mg total) by mouth 3 (three) times daily as needed for cough. 06/15/16  Yes [name], MD  CALCIUM CARBONATE-VIT D-MIN PO Take 2 tablets by mouth daily.    Yes Historical Provider, MD  CRANBERRY EXTRACT PO Take 1 capsule by mouth daily.    Yes Historical Provider, MD  ELIQUIS 5 MG TABS tablet take 1 tablet by mouth twice a day 06/16/16  Yes [name] [name], MD  glucosamine-chondroitin 500-400 MG tablet Take 1 tablet by mouth 3 (three) times daily.     Yes Historical Provider, MD  hydrochlorothiazide (MICROZIDE) 12.5 MG capsule take 1 capsule by mouth once daily 04/13/16  Yes [name], MD  loratadine (CLARITIN) 10 MG tablet Take 5 mg by mouth daily as needed for allergies.   Yes Historical Provider, MD  metoprolol succinate (  TOPROL-XL) 50 MG 24 hr tablet Take 1 tablet (50 mg total) by mouth daily. Take with or immediately following a meal. 01/15/16  Yes [name], MD  Polyethyl Glycol-Propyl Glycol (SYSTANE OP) Place 1 drop into both eyes daily as needed (for dry eyes).   Yes Historical Provider, MD  pravastatin (PRAVACHOL) 40 MG tablet take 1 tablet by mouth once daily 01/16/16  Yes [name], MD    Physical Exam: Vitals:   06/22/16 0300 06/22/16 0347 06/22/16 0430 06/22/16 0500  BP: 176/82 162/69 160/76 157/69  Pulse: 88  87 92    Resp: 20 21 22 16   Temp:      TempSrc:      SpO2: 95%  96% 96%  Weight:      Height:          Constitutional: Moderately built and nourished. Vitals:   06/22/16 0300 06/22/16 0347 06/22/16 0430 06/22/16 0500  BP: 176/82 162/69 160/76 157/69  Pulse: 88  87 92  Resp: 20 21 22 16   Temp:      TempSrc:      SpO2: 95%  96% 96%  Weight:      Height:       Eyes: Anicteric no pallor. ENMT: No discharge from the ears eyes nose and mouth. Neck: No mass felt. No JVD appreciated. Respiratory: No rhonchi or crepitations. Cardiovascular: S1 and S2 heard no murmurs appreciated. Abdomen: Soft nontender bowel sounds present. No guarding or rigidity. Musculoskeletal: No edema. No joint effusion. Skin: No rash. Skin appears warm. Neurologic: Alert awake oriented to time place and person. Moves all extremities. Psychiatric: Appears normal. Normal affect.   Labs on Admission: I have personally reviewed following labs and imaging studies  CBC:  Recent Labs Lab 06/21/16 2328  WBC 13.1*  HGB 14.8  HCT 44.3  MCV 86.4  PLT 123XX123   Basic Metabolic Panel:  Recent Labs Lab 06/21/16 2328  NA 139  K 3.2*  CL 98*  CO2 27  GLUCOSE 182*  BUN 17  CREATININE 0.98  CALCIUM 9.3   GFR: Estimated Creatinine Clearance: 44.5 mL/min (by C-G formula based on SCr of 0.98 mg/dL). Liver Function Tests:  Recent Labs Lab 06/21/16 2328  AST 22  ALT 18  ALKPHOS 46  BILITOT 0.6  PROT 7.0  ALBUMIN 3.8    Recent Labs Lab 06/21/16 2328  LIPASE 18   No results for input(s): AMMONIA in the last 168 hours. Coagulation Profile: No results for input(s): INR, PROTIME in the last 168 hours. Cardiac Enzymes: No results for input(s): CKTOTAL, CKMB, CKMBINDEX, TROPONINI in the last 168 hours. BNP (last 3 results) No results for input(s): PROBNP in the last 8760 hours. HbA1C: No results for input(s): HGBA1C in the last 72 hours. CBG: No results for input(s): GLUCAP in the last 168  hours. Lipid Profile: No results for input(s): CHOL, HDL, LDLCALC, TRIG, CHOLHDL, LDLDIRECT in the last 72 hours. Thyroid Function Tests: No results for input(s): TSH, T4TOTAL, FREET4, T3FREE, THYROIDAB in the last 72 hours. Anemia Panel: No results for input(s): VITAMINB12, FOLATE, FERRITIN, TIBC, IRON, RETICCTPCT in the last 72 hours. Urine analysis:    Component Value Date/Time   COLORURINE YELLOW 06/22/2016 0050   APPEARANCEUR HAZY (A) 06/22/2016 0050   LABSPEC 1.021 06/22/2016 0050   PHURINE 5.0 06/22/2016 0050   GLUCOSEU NEGATIVE 06/22/2016 0050   HGBUR NEGATIVE 06/22/2016 0050   BILIRUBINUR NEGATIVE 06/22/2016 0050   KETONESUR 20 (A) 06/22/2016 0050   PROTEINUR NEGATIVE  06/22/2016 0050   UROBILINOGEN 0.2 08/26/2012 1149   NITRITE NEGATIVE 06/22/2016 0050   LEUKOCYTESUR SMALL (A) 06/22/2016 0050   Sepsis Labs: @LABRCNTIP (procalcitonin:4,lacticidven:4) )No results found for this or any previous visit (from the past 240 hour(s)).   Radiological Exams on Admission: Dg Chest 2 View  Result Date: 06/21/2016 CLINICAL DATA:  Cough and vomiting EXAM: CHEST  2 VIEW COMPARISON:  None. FINDINGS: Low lung volumes. Streaky atelectasis left lung base. No effusion. Mild cardiomegaly with atherosclerosis. Moderate to large hiatal hernia. No pneumothorax. IMPRESSION: 1. Streaky atelectasis at the left lung base 2. Moderate to large hiatal hernia Electronically Signed   By: Donavan Foil M.D.   On: 06/21/2016 23:59   Dg Abd 1 View  Result Date: 06/22/2016 CLINICAL DATA:  All nausea and vomiting after a steak dinner tonight. Chest pain. EXAM: ABDOMEN - 1 VIEW COMPARISON:  None. FINDINGS: Stool and air throughout the colon. No radiographic evidence of bowel obstruction or perforation. No biliary or urinary calculi are evident. Marked curvature and degenerative change of the lumbar spine. IMPRESSION: Negative for obstruction or perforation. Electronically Signed   By: Andreas Newport M.D.   On:  06/22/2016 03:47   US Abdomen Complete  Result Date: 06/22/2016 CLINICAL DATA:  Postprandial chest pain last night EXAM: ABDOMEN ULTRASOUND COMPLETE COMPARISON:  None. FINDINGS: Gallbladder: No gallstones or wall thickening visualized. No sonographic Murphy sign noted by sonographer. Common bile duct: Diameter: 4 mm Liver: Diffuse parenchymal echogenicity without focal lesion. IVC: No abnormality visualized. Pancreas: Visualized portion unremarkable. Spleen: Size and appearance within normal limits. Right Kidney: Length: 10.4 cm. Echogenicity within normal limits. No mass or hydronephrosis visualized. Left Kidney: Length: 11.7 cm. Echogenicity within normal limits. No mass or hydronephrosis visualized. Abdominal aorta: Limited visibility. Visible portions are normal in caliber. Other findings: None. IMPRESSION: Study limited due to bowel gas and hepatic echogenicity. The diffuse hepatic echogenicity likely represents fatty infiltration. Normal gallbladder and bile ducts. Electronically Signed   By: Andreas Newport M.D.   On: 06/22/2016 06:06    EKG: Independently reviewed. Normal sinus rhythm. Left atrial enlargement.  Assessment/Plan Principal Problem:   Nausea & vomiting Active Problems:   Essential hypertension, benign   Paroxysmal atrial fibrillation (HCC)   Chest pain    1. Persistent nausea vomiting - could be viral gastroenteritis or food poisoning. Since patient had some initial pain around the neck while eating ER physician had ordered esophageal grams which is pending. If patient's nausea vomiting persist may consider CT scan. For now I have place patient nothing by mouth IV fluids and antiemetics. Repeat LFTs lipase are pending. Will keep patient on Protonix IV. Will follow CBC to make sure there is no drop in hemoglobin or bleeding. 2. Chest pain - may be related to vomiting. Cycle cardiac markers to rule out ACS. Pain started when patient started having vomiting. Presently chest  pain-free. Pain was left-sided anterior chest wall pressure-like radiating to left shoulder. 3. Paroxysmal atrial fibrillation - since patient is nothing by mouth I have place patient on heparin infusion instead of Apixaban. Instead of by mouth metoprolol I have place patient on scheduled metoprolol IV. 4. Hypertension - patient on when necessary IV hydralazine for systolic blood pressure more than 160. Once patient can take orally change back to patient's home medication. 5. History of TIA.  Urine shows leukocyte esterase positive with WBC but also has squamous cells. Will order urine cultures.   DVT prophylaxis: Heparin. Code Status: Full code.  Family Communication:  Patient's husband.  Disposition Plan: Home.  Consults called: None.  Admission status: Observation.    Rise Patience MD [facility] Pager [phone].  If 7PM-7AM, please contact night-coverage [email or web address] Password [facility]  06/22/2016, 6:36 AM

## 2016-06-22 NOTE — Progress Notes (Signed)
ANTICOAGULATION CONSULT NOTE - Initial Consult  Pharmacy Consult for Heparin Indication: chest pain/ACS  Allergies  Allergen Reactions  . Lisinopril Other (See Comments)    DIZZY    Patient Measurements: Height: 5\' 6"  (167.6 cm) Weight: 167 lb (75.8 kg) IBW/kg (Calculated) : 59.3 Heparin Dosing Weight: 70 kg  Vital Signs: Temp: 98.1 F (36.7 C) (03/05 2206) Temp Source: Oral (03/05 2206) BP: 157/69 (03/06 0500) Pulse Rate: 92 (03/06 0500)  Labs:  Recent Labs  06/21/16 2328  HGB 14.8  HCT 44.3  PLT 299  CREATININE 0.98    Estimated Creatinine Clearance: 44.5 mL/min (by C-G formula based on SCr of 0.98 mg/dL).   Medical History: Past Medical History:  Diagnosis Date  . Candidiasis of vulva and vagina   . Cystocele, midline   . Disorder of bone and cartilage, unspecified   . Fibroids   . Hypertension   . Other chronic cystitis   . Other sign and symptom in breast   . Paroxysmal atrial fibrillation ([place]) 11/30/2012  . Rectal incontinence   . TIA (transient ischemic attack)     Medications:  No current facility-administered medications on file prior to encounter.    Current Outpatient Prescriptions on File Prior to Encounter  Medication Sig Dispense Refill  . azelastine (ASTELIN) 137 MCG/SPRAY nasal spray Place 1 spray into the nose 2 (two) times daily. Use in each nostril as directed (Patient taking differently: Place 1 spray into the nose 2 (two) times daily as needed for rhinitis or allergies. Use in each nostril as directed) 30 mL 11  . benzonatate (TESSALON) 100 MG capsule Take 2 capsules (200 mg total) by mouth 3 (three) times daily as needed for cough. 30 capsule 0  . CALCIUM CARBONATE-VIT D-MIN PO Take 2 tablets by mouth daily.     Marland Kitchen CRANBERRY EXTRACT PO Take 1 capsule by mouth daily.     Marland Kitchen ELIQUIS 5 MG TABS tablet take 1 tablet by mouth twice a day 60 tablet 1  . glucosamine-chondroitin 500-400 MG tablet Take 1 tablet by mouth 3 (three) times daily.       . hydrochlorothiazide (MICROZIDE) 12.5 MG capsule take 1 capsule by mouth once daily 90 capsule 2  . loratadine (CLARITIN) 10 MG tablet Take 5 mg by mouth daily as needed for allergies.    . metoprolol succinate (TOPROL-XL) 50 MG 24 hr tablet Take 1 tablet (50 mg total) by mouth daily. Take with or immediately following a meal. 90 tablet 3  . Polyethyl Glycol-Propyl Glycol (SYSTANE OP) Place 1 drop into both eyes daily as needed (for dry eyes).    . pravastatin (PRAVACHOL) 40 MG tablet take 1 tablet by mouth once daily 30 tablet 11     Assessment: 83 y.o. female admitted with persistent N/V, h/o Afib and Eliquis on hold for heparin  Goal of Therapy:  APTT 66-102 while Eliquis affecting heparin level Heparin level 0.3-0.7 units/ml Monitor platelets by anticoagulation protocol: Yes   Plan:  After baseline labs drawn, start heparin 950 units/hr APTT and heparin level in 8 hours  [name], [name] 06/22/2016,6:39 AM

## 2016-06-22 NOTE — ED Provider Notes (Signed)
Clinical Course as of Jun 23 455  Tue Jun 22, 2016  0207 Urinalysis without evidence of obvious UTI. Mild evidence of dehydration. Patient reports persistent nausea but has not had vomiting. She would like to drink. She was dosed a small dose of Phenergan. IV fluids still infusing.  [CH]    Clinical Course User Index [CH] Merryl Hacker, MD   4:57 AM On multiple rechecks, patient reports persistent nausea refractory to Zofran, Phenergan, Reglan. Denies any abdominal pain. Abdomen was obtained and shows no signs of obstruction. Patient does report onset of symptoms after eating some fruit and steak. She denies any significant foreign body sensation but she states "sometimes it feels like it's dry and something is stuck there." She does have a hiatal hernia on her x-ray.   I question whether she may have an occult food impaction. She is able to tolerate secretions but has persistent nausea/vomiting with food intake. Will order a barium swallow. Patient was also given Protonix. Plan to admit to the hospitalist service given patient's inability to tolerate fluids.   Discussed with Dr. Hal Hope.   Merryl Hacker, MD 06/22/16 [phone]

## 2016-06-22 NOTE — ED Notes (Addendum)
ED Provider at bedside. 

## 2016-06-22 NOTE — Progress Notes (Signed)
ANTICOAGULATION CONSULT NOTE - Initial Consult  Pharmacy Consult for Heparin Indication: chest pain/ACS  Allergies  Allergen Reactions  . Lisinopril Other (See Comments)    DIZZY   Patient Measurements: Height: 5\' 6"  (167.6 cm) Weight: 167 lb (75.8 kg) IBW/kg (Calculated) : 59.3 Heparin Dosing Weight: 70 kg  Vital Signs: Temp: 99.3 F (37.4 C) (03/06 1440) Temp Source: Oral (03/06 1440) BP: 136/64 (03/06 1440) Pulse Rate: 76 (03/06 1440)  Assessment: 83 y.o. female admitted with persistent N/V, h/o Afib and Eliquis on hold for heparin. Heparin level at baseline was falsely elevated at > 2.2. Last aPTT remains low at 34. CBC stable, no s/s of bleed.  Goal of Therapy:  APTT 66-102 while Eliquis affecting heparin level Heparin level 0.3-0.7 units/ml Monitor platelets by anticoagulation protocol: Yes   Plan:  Increase heparin gtt to 1,150 units/hr Check 8 hr aPTT Monitor daily aPTT / heparin level, CBC, s/s of bleed  Elenor Quinones, PharmD, [facility] Clinical Pharmacist Pager [phone] 06/22/2016 4:13 PM

## 2016-06-22 NOTE — Progress Notes (Signed)
I have seen and assessed patient and agree with Dr. technicalities assessment and plan. Patient is a pleasant 83 year old female with history of paroxysmal atrial fibrillation, hypertension, TIA presented to the ED with persistent nausea vomiting and chest pain since 6 PM on 06/21/2016 which occurred an hour and a half after having steak dinner. Patient noted to have persistent emesis in the ED. Chest x-ray KUB were unremarkable. EKG shows normal sinus rhythm. Physical exam Gen.: NAD Respirations: CTA B Cardiovascular: Regular rate rhythm no murmurs rubs or gallops Abdomen: Upper abdominal distention, soft, nontender to palpation, hyperactive bowel sounds. Extremities: No clubbing cyanosis or edema  Assessment/plan #1 nausea vomiting Likely secondary to gastric outlet obstruction versus small bowel obstruction as noted on esophagram which was done 06/22/2016. Patient currently chest pain-free. Patient states no further emesis this morning. Will keep patient nothing by mouth. CT abdomen and pelvis has been ordered which will have to be done in a few hours as patient noted to have contrast retained on esophagram. IV fluids. Antiemetics. Supportive care. We'll consult general surgery for further evaluation and management.  No charge.

## 2016-06-22 NOTE — ED Notes (Signed)
Patient transported to X-ray 

## 2016-06-23 ENCOUNTER — Encounter (HOSPITAL_COMMUNITY): Payer: Self-pay | Admitting: Internal Medicine

## 2016-06-23 DIAGNOSIS — K3189 Other diseases of stomach and duodenum: Secondary | ICD-10-CM | POA: Diagnosis present

## 2016-06-23 DIAGNOSIS — K449 Diaphragmatic hernia without obstruction or gangrene: Secondary | ICD-10-CM

## 2016-06-23 HISTORY — DX: Diaphragmatic hernia without obstruction or gangrene: K44.9

## 2016-06-23 LAB — CBC WITH DIFFERENTIAL/PLATELET
BASOS PCT: 0 %
Basophils Absolute: 0 10*3/uL (ref 0.0–0.1)
Eosinophils Absolute: 0.1 10*3/uL (ref 0.0–0.7)
Eosinophils Relative: 1 %
HEMATOCRIT: 37.9 % (ref 36.0–46.0)
HEMOGLOBIN: 12.5 g/dL (ref 12.0–15.0)
LYMPHS ABS: 5.2 10*3/uL — AB (ref 0.7–4.0)
Lymphocytes Relative: 38 %
MCH: 28.9 pg (ref 26.0–34.0)
MCHC: 33 g/dL (ref 30.0–36.0)
MCV: 87.7 fL (ref 78.0–100.0)
MONO ABS: 1.3 10*3/uL — AB (ref 0.1–1.0)
MONOS PCT: 10 %
NEUTROS ABS: 7 10*3/uL (ref 1.7–7.7)
Neutrophils Relative %: 51 %
Platelets: 279 10*3/uL (ref 150–400)
RBC: 4.32 MIL/uL (ref 3.87–5.11)
RDW: 15.4 % (ref 11.5–15.5)
WBC: 13.5 10*3/uL — ABNORMAL HIGH (ref 4.0–10.5)

## 2016-06-23 LAB — URINE CULTURE

## 2016-06-23 LAB — BASIC METABOLIC PANEL
ANION GAP: 6 (ref 5–15)
BUN: 21 mg/dL — ABNORMAL HIGH (ref 6–20)
CHLORIDE: 111 mmol/L (ref 101–111)
CO2: 26 mmol/L (ref 22–32)
Calcium: 8.3 mg/dL — ABNORMAL LOW (ref 8.9–10.3)
Creatinine, Ser: 0.8 mg/dL (ref 0.44–1.00)
GFR calc non Af Amer: >60 mL/min (ref >60)
GLUCOSE: 107 mg/dL — AB (ref 65–99)
POTASSIUM: 3.4 mmol/L — AB (ref 3.5–5.1)
Sodium: 143 mmol/L (ref 135–145)

## 2016-06-23 LAB — GLUCOSE, CAPILLARY: GLUCOSE-CAPILLARY: 97 mg/dL (ref 65–99)

## 2016-06-23 LAB — MAGNESIUM: Magnesium: 2 mg/dL (ref 1.7–2.4)

## 2016-06-23 LAB — APTT
APTT: 71 s — AB (ref 24–36)
APTT: 87 s — AB (ref 24–36)
aPTT: 132 seconds — ABNORMAL HIGH (ref 24–36)

## 2016-06-23 LAB — HEPARIN LEVEL (UNFRACTIONATED): Heparin Unfractionated: 1.8 IU/mL — ABNORMAL HIGH (ref 0.30–0.70)

## 2016-06-23 MED ORDER — LORATADINE 10 MG PO TABS: 5.0000 mg | ORAL_TABLET | Freq: Every day | ORAL | Status: DC | PRN

## 2016-06-23 MED ORDER — SODIUM CHLORIDE 0.9 % IV SOLN
INTRAVENOUS | Status: AC
  Administered 2016-06-23: 09:00:00 via INTRAVENOUS

## 2016-06-23 MED ORDER — HEPARIN (PORCINE) IN NACL 100-0.45 UNIT/ML-% IJ SOLN
900.0000 [IU]/h | INTRAMUSCULAR | Status: DC
  Administered 2016-06-23: 950 [IU]/h via INTRAVENOUS
  Filled 2016-06-23: qty 250

## 2016-06-23 MED ORDER — METOPROLOL SUCCINATE ER 50 MG PO TB24
50.0000 mg | ORAL_TABLET | Freq: Every day | ORAL | Status: DC
  Administered 2016-06-24 – 2016-06-26 (×3): 50 mg via ORAL
  Filled 2016-06-23 (×4): qty 1

## 2016-06-23 MED ORDER — POTASSIUM CHLORIDE CRYS ER 20 MEQ PO TBCR
40.0000 meq | EXTENDED_RELEASE_TABLET | Freq: Once | ORAL | Status: AC
  Administered 2016-06-23: 40 meq via ORAL
  Filled 2016-06-23: qty 2

## 2016-06-23 NOTE — Progress Notes (Signed)
PROGRESS NOTE    TRANAE LARAMIE  VEL:[id number] DOB: Nov 22, 1930 DOA: 06/21/2016 PCP: Wyatt Haste, MD    Brief Narrative:  [name] is a 83 y.o. female with history of paroxysmal atrial fibrillation, hypertension, TIA presents to the ER because of persistent nausea vomiting with chest pain since last evening 6 PM. Patient states she was diagnosed with pneumonia last week and had completed a one-week course of Levaquin, last dose was yesterday. Last evening after having steak half an hour later patient started having nausea vomiting with left upper quadrant abdominal pain with chest pain radiating to the left shoulder. Patient had multiple episodes of vomiting. 2 days ago patient had mild diarrhea.   ED Course: In the ER EKG shows normal sinus rhythm with chest x-ray and KUB being unremarkable. Sonogram of the abdomen does not show any gallstones. Patient still persistently has nausea vomiting. Will be admitted for further observation. Patient was chest pain-free on admission.  Patient was admitted esophagram which was done was concerning for a gastric outlet obstruction versus small bowel obstruction. CT abdomen and pelvis was also done which did show large intrathoracic gastric hernia, no wall thickening or pneumatosis in the visualized stomach, findings most likely representing spontaneous interval reduction of a gastric volvulus. No evidence of gastric outlet obstruction or small bowel obstruction. General surgery was consulted and followed the patient during the hospitalization.    Assessment & Plan:   Principal Problem:   Acute gastric volvulus Active Problems:   Large hiatal hernia   Nausea & vomiting   Essential hypertension, benign   Paroxysmal atrial fibrillation (HCC)   Chest pain   Nausea and vomiting  #1 large hiatal show hernia probably would spontaneously reduced acute gastric volvulus As noted per esophagram and CT abdomen and pelvis. Patient with clinical  improvement with no further nausea or vomiting. Patient has been placed on a clear liquid diet per general surgery which she is tolerating. Diet to be advanced slowly as tolerated to a soft diet per general surgical recommendations. Continue IV PPI. Patient will likely need elective outpatient repair with further workup with EGD, possible manometry, cardiac clearance and eventual upper scopic I show hernia repair/possible fundoplication per general surgery recommendations. General surgery following and appreciate input and recommendations.  #2 hypokalemia Replete.  #3 paroxysmal atrial fibrillation Currently rate controlled on IV Lopressor. Once diet is advanced and patient tolerating oral intake will switch back to home dose oral metoprolol. IV heparin for anticoagulation. Will transition back to home regimen of oral eliquis when tolerating oral intake. Follow.  #4 dehydration HCTZ on hold. Gentle hydration.  #5 chest pain Secondary to problem #1. Resolved.  #6 hypertension Blood pressure currently stable. Patient on IV Lopressor. Follow.  #7 history of TIA Stable. Continue anticoagulation for secondary stroke prevention.   DVT prophylaxis: On heparin. Once diet has been advanced and able to tolerate oral intake will resume home regimen of eliquis. Code Status: Full Family Communication: Updated patient and family at bedside. Disposition Plan: Home when tolerating oral intake, continued bowel movements and per general surgery. Hopefully in the next 1-2 days.   Consultants:   Gen. surgery: Dr.Tsuei 06/22/2016  Procedures:   Esophagram 06/22/2016  CT abdomen and pelvis 06/22/2016  Chest x-ray 06/22/2016  Abdominal x-ray 06/22/2016  Antimicrobials:   None   Subjective: Patient just finished a clear liquid diet which she tolerated. Patient denies any further nausea or emesis. Patient stated had bowel movement yesterday. Feels much better. No  complaints.  Objective: Vitals:   06/22/16 2219 06/22/16 2353 06/23/16 0619 06/23/16 1344  BP: (!) 143/54 (!) 141/54 (!) 132/54 (!) 149/61  Pulse: (!) 55 (!) 59 70 73  Resp:   18 18  Temp: 98.4 F (36.9 C)  98 F (36.7 C) 98.3 F (36.8 C)  TempSrc:   Oral   SpO2: 93%  94% 97%  Weight:   76.2 kg (168 lb 1.6 oz)   Height:        Intake/Output Summary (Last 24 hours) at 06/23/16 1808 Last data filed at 06/23/16 1500  Gross per 24 hour  Intake          1767.04 ml  Output              876 ml  Net           891.04 ml   Filed Weights   06/21/16 2206 06/23/16 0619  Weight: 75.8 kg (167 lb) 76.2 kg (168 lb 1.6 oz)    Examination:  General exam: Appears calm and comfortable  Respiratory system: Clear to auscultation. Respiratory effort normal. Cardiovascular system: S1 & S2 heard, RRR. No JVD, murmurs, rubs, gallops or clicks. No pedal edema. Gastrointestinal system: Abdomen is nondistended, soft and nontender. No organomegaly or masses felt. Normal bowel sounds heard. Central nervous system: Alert and oriented. No focal neurological deficits. Extremities: Symmetric 5 x 5 power. Skin: No rashes, lesions or ulcers Psychiatry: Judgement and insight appear normal. Mood & affect appropriate.     Data Reviewed: I have personally reviewed following labs and imaging studies  CBC:  Recent Labs Lab 06/21/16 2328 06/23/16 0010  WBC 13.1* 13.5*  NEUTROABS  --  7.0  HGB 14.8 12.5  HCT 44.3 37.9  MCV 86.4 87.7  PLT 299 585   Basic Metabolic Panel:  Recent Labs Lab 06/21/16 2328 06/22/16 1513 06/23/16 1034  NA 139  --  143  K 3.2*  --  3.4*  CL 98*  --  111  CO2 27  --  26  GLUCOSE 182*  --  107*  BUN 17  --  21*  CREATININE 0.98  --  0.80  CALCIUM 9.3  --  8.3*  MG  --  1.8 2.0   GFR: Estimated Creatinine Clearance: 54.6 mL/min (by C-G formula based on SCr of 0.8 mg/dL). Liver Function Tests:  Recent Labs Lab 06/21/16 2328 06/22/16 0753  AST 22 31  ALT 18  17  ALKPHOS 46 44  BILITOT 0.6 0.6  PROT 7.0 7.3  ALBUMIN 3.8 3.7    Recent Labs Lab 06/21/16 2328 06/22/16 0753  LIPASE 18 22   No results for input(s): AMMONIA in the last 168 hours. Coagulation Profile:  Recent Labs Lab 06/22/16 0753  INR 1.31   Cardiac Enzymes:  Recent Labs Lab 06/22/16 0753 06/22/16 1513 06/22/16 1816  TROPONINI <0.03 <0.03 <0.03   BNP (last 3 results) No results for input(s): PROBNP in the last 8760 hours. HbA1C: No results for input(s): HGBA1C in the last 72 hours. CBG:  Recent Labs Lab 06/23/16 1218  GLUCAP 97   Lipid Profile: No results for input(s): CHOL, HDL, LDLCALC, TRIG, CHOLHDL, LDLDIRECT in the last 72 hours. Thyroid Function Tests: No results for input(s): TSH, T4TOTAL, FREET4, T3FREE, THYROIDAB in the last 72 hours. Anemia Panel: No results for input(s): VITAMINB12, FOLATE, FERRITIN, TIBC, IRON, RETICCTPCT in the last 72 hours. Sepsis Labs: No results for input(s): PROCALCITON, LATICACIDVEN in the last 168 hours.  Recent Results (  from the past 240 hour(s))  Culture, Urine     Status: Abnormal   Collection Time: 06/22/16  8:23 AM  Result Value Ref Range Status   Specimen Description URINE, CLEAN CATCH  Final   Special Requests NONE  Final   Culture MULTIPLE SPECIES PRESENT, SUGGEST RECOLLECTION (A)  Final   Report Status 06/23/2016 FINAL  Final         Radiology Studies: Dg Chest 2 View  Result Date: 06/21/2016 CLINICAL DATA:  Cough and vomiting EXAM: CHEST  2 VIEW COMPARISON:  None. FINDINGS: Low lung volumes. Streaky atelectasis left lung base. No effusion. Mild cardiomegaly with atherosclerosis. Moderate to large hiatal hernia. No pneumothorax. IMPRESSION: 1. Streaky atelectasis at the left lung base 2. Moderate to large hiatal hernia Electronically Signed   By: Donavan Foil M.D.   On: 06/21/2016 23:59   Dg Abd 1 View  Result Date: 06/22/2016 CLINICAL DATA:  All nausea and vomiting after a steak dinner tonight.  Chest pain. EXAM: ABDOMEN - 1 VIEW COMPARISON:  None. FINDINGS: Stool and air throughout the colon. No radiographic evidence of bowel obstruction or perforation. No biliary or urinary calculi are evident. Marked curvature and degenerative change of the lumbar spine. IMPRESSION: Negative for obstruction or perforation. Electronically Signed   By: Andreas Newport M.D.   On: 06/22/2016 03:47   US Abdomen Complete  Result Date: 06/22/2016 CLINICAL DATA:  Postprandial chest pain last night EXAM: ABDOMEN ULTRASOUND COMPLETE COMPARISON:  None. FINDINGS: Gallbladder: No gallstones or wall thickening visualized. No sonographic Murphy sign noted by sonographer. Common bile duct: Diameter: 4 mm Liver: Diffuse parenchymal echogenicity without focal lesion. IVC: No abnormality visualized. Pancreas: Visualized portion unremarkable. Spleen: Size and appearance within normal limits. Right Kidney: Length: 10.4 cm. Echogenicity within normal limits. No mass or hydronephrosis visualized. Left Kidney: Length: 11.7 cm. Echogenicity within normal limits. No mass or hydronephrosis visualized. Abdominal aorta: Limited visibility. Visible portions are normal in caliber. Other findings: None. IMPRESSION: Study limited due to bowel gas and hepatic echogenicity. The diffuse hepatic echogenicity likely represents fatty infiltration. Normal gallbladder and bile ducts. Electronically Signed   By: Andreas Newport M.D.   On: 06/22/2016 06:06   Ct Abdomen Pelvis W Contrast  Result Date: 06/22/2016 CLINICAL DATA:  Vomiting for 2 days. Abdominal pain. Clinical concern for gastric outlet obstruction on upper GI from earlier today. EXAM: CT ABDOMEN AND PELVIS WITH CONTRAST TECHNIQUE: Multidetector CT imaging of the abdomen and pelvis was performed using the standard protocol following bolus administration of intravenous contrast. CONTRAST:  100 cc ISOVUE-300 IOPAMIDOL (ISOVUE-300) INJECTION 61% COMPARISON:  Upper GI and abdominal radiograph  from earlier today. FINDINGS: Visualization of the upper abdomen is limited by streak artifact from barium in the stomach. Lower chest: Trace bilateral dependent pleural effusions. Compressive atelectasis in the medial and dependent lower lobes bilaterally. Hepatobiliary: Diffuse hepatic steatosis. Normal liver size. No liver mass. Normal gallbladder with no radiopaque cholelithiasis. No biliary ductal dilatation. Pancreas: Normal, with no mass or duct dilation. Spleen: Normal size. No mass. Adrenals/Urinary Tract: Normal adrenals. No hydronephrosis. Mild segmental renal cortical scarring in the posterior upper right kidney. No renal mass. Normal bladder. Stomach/Bowel: There is a large intrathoracic gastric hernia, which is only partially visualized on this CT abdomen study. There is retained barium contrast and food debris in the mildly distended visualized intrathoracic portion of the stomach. No gastric wall thickening or pneumatosis is seen in the visualized stomach. The distal intra- abdominal portion of the stomach is  collapsed and grossly normal. The duodenum is relatively collapsed and normal. Normal caliber small bowel with no small bowel wall thickening. Oral contrast progresses to the colon. Normal appendix. Marked sigmoid diverticulosis, with no large bowel wall thickening or pericolonic fat stranding. Vascular/Lymphatic: Atherosclerotic nonaneurysmal abdominal aorta. Patent portal, splenic, hepatic and renal veins. No pathologically enlarged lymph nodes in the abdomen or pelvis. Reproductive: Status post hysterectomy, with no abnormal findings at the vaginal cuff. No adnexal mass. Other: No pneumoperitoneum, ascites or focal fluid collection. Musculoskeletal: No aggressive appearing focal osseous lesions. Marked thoracolumbar spondylosis with mild to moderate levocurvature of the thoracolumbar spine . IMPRESSION: 1. Large intrathoracic gastric hernia, only partially visualized on this CT abdomen study.  Intrathoracic stomach is only mildly distended and barium contrast progresses to the colon. No wall thickening or pneumatosis in the visualized stomach. Findings most likely represent spontaneous interval reduction of a gastric volvulus. No evidence of gastric outlet obstruction or small bowel obstruction on this CT study. A follow-up upper GI could be obtained on a short-term basis as clinically warranted. 2. Trace bilateral dependent pleural effusions with bibasilar atelectasis . 3. Additional findings include aortic atherosclerosis, diffuse hepatic steatosis and marked sigmoid diverticulosis. Electronically Signed   By: Ilona Sorrel M.D.   On: 06/22/2016 14:23   Dg Esophagus  Result Date: 06/22/2016 CLINICAL DATA:  Persistent vomiting. Impaction? UNABLE TO SWALLOW ANYTHING SINCE YESTERDAY VOMITING, REGURITATION EXAM: ESOPHOGRAM/BARIUM SWALLOW TECHNIQUE: Single contrast examination was performed using thin barium or water soluble. FLUOROSCOPY TIME:  Fluoroscopy Time:  3 minutes and 6 seconds Number of Acquired Spot Images: 2 COMPARISON:  Chest x-ray from earlier same day. FINDINGS: Barium contrast material was administered orally during fluoroscopic examination. There is prompt flow of the contrast to the level of the distal esophagus. There is a very delayed flow of the contrast from the distal esophagus to the stomach. The stomach appears distended with ingested materials blocking flow of the contrast material beyond the stomach cardia. Findings on today's exam, combined with the appearance on earlier chest x-ray, raises the possibility of large paraesophageal hiatal hernia or gastric volvulus. IMPRESSION: Contrast is obstructed at the level of the stomach cardia. There is an associated delay in the passage of contrast from the lower esophagus to the stomach. Stomach appears to be nearly completely field with ingested materials blocking the flow of contrast. Findings suggest either proximal small bowel  obstruction or gastric outlet obstruction. Findings on today's exam, combined with the appearance on earlier chest x-ray, also raise the possibility of paraesophageal hiatal hernia or gastric volvulus. Recommend CT abdomen and pelvis for further characterization. These results were called by telephone at the time of interpretation on 06/22/2016 at 9:14 am to Dr. Grandville Silos, who verbally acknowledged these results. Electronically Signed   By: Franki Cabot M.D.   On: 06/22/2016 09:15        Scheduled Meds: . metoprolol  5 mg Intravenous Q6H  . pantoprazole (PROTONIX) IV  40 mg Intravenous Q24H   Continuous Infusions: . sodium chloride 75 mL/hr at 06/23/16 0925  . heparin 950 Units/hr (06/23/16 1013)     LOS: 1 day    Time spent: 78 minutes    Mykia Holton, MD [facility] Pager [phone]  If 7PM-7AM, please contact night-coverage [email or web address] Password [facility] 06/23/2016, 6:08 PM

## 2016-06-23 NOTE — Progress Notes (Signed)
[place] for Heparin Indication: chest pain/ACS  Allergies  Allergen Reactions  . Lisinopril Other (See Comments)    DIZZY    Patient Measurements: Height: 5\' 6"  (167.6 cm) Weight: 167 lb (75.8 kg) IBW/kg (Calculated) : 59.3 Heparin Dosing Weight: 70 kg  Vital Signs: Temp: 98.4 F (36.9 C) (03/06 2219) Temp Source: Oral (03/06 1440) BP: 141/54 (03/06 2353) Pulse Rate: 59 (03/06 2353)  Labs:  Recent Labs  06/21/16 2328 06/22/16 0753 06/22/16 1513 06/22/16 1816 06/23/16 0010  HGB 14.8  --   --   --  12.5  HCT 44.3  --   --   --  37.9  PLT 299  --   --   --  279  APTT  --  31 34  --  87*  LABPROT  --  16.3*  --   --   --   INR  --  1.31  --   --   --   HEPARINUNFRC  --  >2.20* 2.18*  --   --   CREATININE 0.98  --   --   --   --   TROPONINI  --  <0.03 <0.03 <0.03  --     Estimated Creatinine Clearance: 44.5 mL/min (by C-G formula based on SCr of 0.98 mg/dL).  Assessment: 83 y.o. female admitted with persistent N/V, h/o Afib and Eliquis on hold for heparin  Goal of Therapy:  APTT 66-102 while Eliquis affecting heparin level Heparin level 0.3-0.7 units/ml Monitor platelets by anticoagulation protocol: Yes   Plan:  Continue Heparin at current rate  Follow-up am labs.   [name], Bronson Curb 06/23/2016,1:18 AM

## 2016-06-23 NOTE — Progress Notes (Signed)
[place] for Heparin Indication: chest pain/ACS  Allergies  Allergen Reactions  . Lisinopril Other (See Comments)    DIZZY   Patient Measurements: Height: 5\' 6"  (167.6 cm) Weight: 168 lb 1.6 oz (76.2 kg) IBW/kg (Calculated) : 59.3 Heparin Dosing Weight: 74.6 kg  Vital Signs: Temp: 98.3 F (36.8 C) (03/07 1344) BP: 149/61 (03/07 1344) Pulse Rate: 73 (03/07 1344)  Labs:  Recent Labs  06/21/16 2328  06/22/16 0753 06/22/16 1513 06/22/16 1816 06/23/16 0010 06/23/16 0722 06/23/16 1034 06/23/16 1930  HGB 14.8  --   --   --   --  12.5  --   --   --   HCT 44.3  --   --   --   --  37.9  --   --   --   PLT 299  --   --   --   --  279  --   --   --   APTT  --   < > 31 34  --  87* 132*  --  71*  LABPROT  --   --  16.3*  --   --   --   --   --   --   INR  --   --  1.31  --   --   --   --   --   --   HEPARINUNFRC  --   --  >2.20* 2.18*  --  1.80*  --   --   --   CREATININE 0.98  --   --   --   --   --   --  0.80  --   TROPONINI  --   --  <0.03 <0.03 <0.03  --   --   --   --   < > = values in this interval not displayed.  Estimated Creatinine Clearance: 54.6 mL/min (by C-G formula based on SCr of 0.8 mg/dL).  Assessment: 83 y.o. female admitted with persistent N/V, h/o Afib and Eliquis on hold for heparin.  aPTT therapeutic after reducing heparin infusion rate. Rn reports no bleeding or infusion issues.   Goal of Therapy:  aPTT 66-102 while Eliquis affecting heparin level Heparin level 0.3-0.7 units/ml Monitor platelets by anticoagulation protocol: Yes   Plan:  Continue heparin at 950 units/hr  Check anti-Xa level daily while on heparin Check aPTT daily until anti-xa level correlates Continue to monitor H&H and platelets  Thank you for allowing [place] to participate in this patients care.  Georga Bora, PharmD Clinical Pharmacist Pager: [phone] 06/23/2016 8:56 PM

## 2016-06-23 NOTE — Progress Notes (Signed)
[place] for Heparin Indication: chest pain/ACS  Allergies  Allergen Reactions  . Lisinopril Other (See Comments)    DIZZY    Patient Measurements: Height: 5\' 6"  (167.6 cm) Weight: 168 lb 1.6 oz (76.2 kg) IBW/kg (Calculated) : 59.3 Heparin Dosing Weight: 74.6 kg  Vital Signs: Temp: 98 F (36.7 C) (03/07 0619) Temp Source: Oral (03/07 0619) BP: 132/54 (03/07 0619) Pulse Rate: 70 (03/07 0619)  Labs:  Recent Labs  06/21/16 2328  06/22/16 0753 06/22/16 1513 06/22/16 1816 06/23/16 0010 06/23/16 0722  HGB 14.8  --   --   --   --  12.5  --   HCT 44.3  --   --   --   --  37.9  --   PLT 299  --   --   --   --  279  --   APTT  --   < > 31 34  --  87* 132*  LABPROT  --   --  16.3*  --   --   --   --   INR  --   --  1.31  --   --   --   --   HEPARINUNFRC  --   --  >2.20* 2.18*  --  1.80*  --   CREATININE 0.98  --   --   --   --   --   --   TROPONINI  --   --  <0.03 <0.03 <0.03  --   --   < > = values in this interval not displayed.  Estimated Creatinine Clearance: 44.6 mL/min (by C-G formula based on SCr of 0.98 mg/dL).  Assessment: 83 y.o. female admitted with persistent N/V, h/o Afib and Eliquis on hold for heparin. aPTT is supratherapeutic at 132 this morning. No bleeding noted, CBC wnl.  Goal of Therapy:  APTT 66-102 while Eliquis affecting heparin level Heparin level 0.3-0.7 units/ml Monitor platelets by anticoagulation protocol: Yes   Plan:  Decrease Heparin to 950 units/hr  Check anti-Xa level daily while on heparin Check aPTT in 8 hours and daily until anti-xa level correlates Continue to monitor H&H and platelets   Thank you for allowing [place] to participate in this patients care.  Jens Som, PharmD Clinical phone for 06/23/2016 from 7a-3:30p: [phone] If after 3:30p, please call main pharmacy at: [phone] 06/23/2016 10:37 AM

## 2016-06-23 NOTE — Progress Notes (Signed)
[facility] Progress Note     Subjective: No acute events overnight. No nausea, vomiting, abdominal pain. No BM or flatus since yesterday.   Objective: Vital signs in last 24 hours: Temp:  [98 F (36.7 C)-99.3 F (37.4 C)] 98 F (36.7 C) (03/07 0619) Pulse Rate:  [55-82] 70 (03/07 0619) Resp:  [18-23] 18 (03/07 0619) BP: (113-143)/(48-64) 132/54 (03/07 0619) SpO2:  [92 %-97 %] 94 % (03/07 0619) Weight:  [168 lb 1.6 oz (76.2 kg)] 168 lb 1.6 oz (76.2 kg) (03/07 0619) Last BM Date: 06/22/16  Intake/Output from previous day: 03/06 0701 - 03/07 0700 In: 1347 [I.V.:1347] Out: 251 [Urine:251] Intake/Output this shift: Total I/O In: 0  Out: 200 [Urine:200]  PE: Gen:  Alert, NAD, pleasant, cooperative, well appearing Card:  RRR, no M/G/R heard Pulm:  Rate and effort normal Abd: Soft, NT/ND, +BS Skin: no rashes noted, warm and dry  Lab Results:   Recent Labs  06/21/16 2328 06/23/16 0010  WBC 13.1* 13.5*  HGB 14.8 12.5  HCT 44.3 37.9  PLT 299 279   BMET  Recent Labs  06/21/16 2328  NA 139  K 3.2*  CL 98*  CO2 27  GLUCOSE 182*  BUN 17  CREATININE 0.98  CALCIUM 9.3   PT/INR  Recent Labs  06/22/16 0753  LABPROT 16.3*  INR 1.31   CMP     Component Value Date/Time   NA 139 06/21/2016 2328   K 3.2 (L) 06/21/2016 2328   CL 98 (L) 06/21/2016 2328   CO2 27 06/21/2016 2328   GLUCOSE 182 (H) 06/21/2016 2328   BUN 17 06/21/2016 2328   CREATININE 0.98 06/21/2016 2328   CREATININE 0.86 01/15/2016 1016   CALCIUM 9.3 06/21/2016 2328   PROT 7.3 06/22/2016 0753   ALBUMIN 3.7 06/22/2016 0753   AST 31 06/22/2016 0753   ALT 17 06/22/2016 0753   ALKPHOS 44 06/22/2016 0753   BILITOT 0.6 06/22/2016 0753   GFRNONAA 52 (L) 06/21/2016 2328   GFRAA 60 (L) 06/21/2016 2328   Lipase     Component Value Date/Time   LIPASE 22 06/22/2016 0753       Studies/Results: Dg Chest 2 View  Result Date: 06/21/2016 CLINICAL DATA:  Cough and vomiting EXAM:  CHEST  2 VIEW COMPARISON:  None. FINDINGS: Low lung volumes. Streaky atelectasis left lung base. No effusion. Mild cardiomegaly with atherosclerosis. Moderate to large hiatal hernia. No pneumothorax. IMPRESSION: 1. Streaky atelectasis at the left lung base 2. Moderate to large hiatal hernia Electronically Signed   By: Donavan Foil M.D.   On: 06/21/2016 23:59   Dg Abd 1 View  Result Date: 06/22/2016 CLINICAL DATA:  All nausea and vomiting after a steak dinner tonight. Chest pain. EXAM: ABDOMEN - 1 VIEW COMPARISON:  None. FINDINGS: Stool and air throughout the colon. No radiographic evidence of bowel obstruction or perforation. No biliary or urinary calculi are evident. Marked curvature and degenerative change of the lumbar spine. IMPRESSION: Negative for obstruction or perforation. Electronically Signed   By: Andreas Newport M.D.   On: 06/22/2016 03:47   US Abdomen Complete  Result Date: 06/22/2016 CLINICAL DATA:  Postprandial chest pain last night EXAM: ABDOMEN ULTRASOUND COMPLETE COMPARISON:  None. FINDINGS: Gallbladder: No gallstones or wall thickening visualized. No sonographic Murphy sign noted by sonographer. Common bile duct: Diameter: 4 mm Liver: Diffuse parenchymal echogenicity without focal lesion. IVC: No abnormality visualized. Pancreas: Visualized portion unremarkable. Spleen: Size and appearance within normal limits. Right Kidney: Length: 10.4  cm. Echogenicity within normal limits. No mass or hydronephrosis visualized. Left Kidney: Length: 11.7 cm. Echogenicity within normal limits. No mass or hydronephrosis visualized. Abdominal aorta: Limited visibility. Visible portions are normal in caliber. Other findings: None. IMPRESSION: Study limited due to bowel gas and hepatic echogenicity. The diffuse hepatic echogenicity likely represents fatty infiltration. Normal gallbladder and bile ducts. Electronically Signed   By: Andreas Newport M.D.   On: 06/22/2016 06:06   Ct Abdomen Pelvis W  Contrast  Result Date: 06/22/2016 CLINICAL DATA:  Vomiting for 2 days. Abdominal pain. Clinical concern for gastric outlet obstruction on upper GI from earlier today. EXAM: CT ABDOMEN AND PELVIS WITH CONTRAST TECHNIQUE: Multidetector CT imaging of the abdomen and pelvis was performed using the standard protocol following bolus administration of intravenous contrast. CONTRAST:  100 cc ISOVUE-300 IOPAMIDOL (ISOVUE-300) INJECTION 61% COMPARISON:  Upper GI and abdominal radiograph from earlier today. FINDINGS: Visualization of the upper abdomen is limited by streak artifact from barium in the stomach. Lower chest: Trace bilateral dependent pleural effusions. Compressive atelectasis in the medial and dependent lower lobes bilaterally. Hepatobiliary: Diffuse hepatic steatosis. Normal liver size. No liver mass. Normal gallbladder with no radiopaque cholelithiasis. No biliary ductal dilatation. Pancreas: Normal, with no mass or duct dilation. Spleen: Normal size. No mass. Adrenals/Urinary Tract: Normal adrenals. No hydronephrosis. Mild segmental renal cortical scarring in the posterior upper right kidney. No renal mass. Normal bladder. Stomach/Bowel: There is a large intrathoracic gastric hernia, which is only partially visualized on this CT abdomen study. There is retained barium contrast and food debris in the mildly distended visualized intrathoracic portion of the stomach. No gastric wall thickening or pneumatosis is seen in the visualized stomach. The distal intra- abdominal portion of the stomach is collapsed and grossly normal. The duodenum is relatively collapsed and normal. Normal caliber small bowel with no small bowel wall thickening. Oral contrast progresses to the colon. Normal appendix. Marked sigmoid diverticulosis, with no large bowel wall thickening or pericolonic fat stranding. Vascular/Lymphatic: Atherosclerotic nonaneurysmal abdominal aorta. Patent portal, splenic, hepatic and renal veins. No  pathologically enlarged lymph nodes in the abdomen or pelvis. Reproductive: Status post hysterectomy, with no abnormal findings at the vaginal cuff. No adnexal mass. Other: No pneumoperitoneum, ascites or focal fluid collection. Musculoskeletal: No aggressive appearing focal osseous lesions. Marked thoracolumbar spondylosis with mild to moderate levocurvature of the thoracolumbar spine . IMPRESSION: 1. Large intrathoracic gastric hernia, only partially visualized on this CT abdomen study. Intrathoracic stomach is only mildly distended and barium contrast progresses to the colon. No wall thickening or pneumatosis in the visualized stomach. Findings most likely represent spontaneous interval reduction of a gastric volvulus. No evidence of gastric outlet obstruction or small bowel obstruction on this CT study. A follow-up upper GI could be obtained on a short-term basis as clinically warranted. 2. Trace bilateral dependent pleural effusions with bibasilar atelectasis . 3. Additional findings include aortic atherosclerosis, diffuse hepatic steatosis and marked sigmoid diverticulosis. Electronically Signed   By: Ilona Sorrel M.D.   On: 06/22/2016 14:23   Dg Esophagus  Result Date: 06/22/2016 CLINICAL DATA:  Persistent vomiting. Impaction? UNABLE TO SWALLOW ANYTHING SINCE YESTERDAY VOMITING, REGURITATION EXAM: ESOPHOGRAM/BARIUM SWALLOW TECHNIQUE: Single contrast examination was performed using thin barium or water soluble. FLUOROSCOPY TIME:  Fluoroscopy Time:  3 minutes and 6 seconds Number of Acquired Spot Images: 2 COMPARISON:  Chest x-ray from earlier same day. FINDINGS: Barium contrast material was administered orally during fluoroscopic examination. There is prompt flow of the contrast to the  level of the distal esophagus. There is a very delayed flow of the contrast from the distal esophagus to the stomach. The stomach appears distended with ingested materials blocking flow of the contrast material beyond the  stomach cardia. Findings on today's exam, combined with the appearance on earlier chest x-ray, raises the possibility of large paraesophageal hiatal hernia or gastric volvulus. IMPRESSION: Contrast is obstructed at the level of the stomach cardia. There is an associated delay in the passage of contrast from the lower esophagus to the stomach. Stomach appears to be nearly completely field with ingested materials blocking the flow of contrast. Findings suggest either proximal small bowel obstruction or gastric outlet obstruction. Findings on today's exam, combined with the appearance on earlier chest x-ray, also raise the possibility of paraesophageal hiatal hernia or gastric volvulus. Recommend CT abdomen and pelvis for further characterization. These results were called by telephone at the time of interpretation on 06/22/2016 at 9:14 am to Dr. Grandville Silos, who verbally acknowledged these results. Electronically Signed   By: Franki Cabot M.D.   On: 06/22/2016 09:15    Anti-infectives: Anti-infectives    None       Assessment/Plan Large Intrathoracic Gastric Hernia - contrast in colon, pt not obstructed - no surgical intervention at this time, suggest outpt f/u with Dr. Emogene Morgan - clear liquids and advance diet as tolerated. Would recommend not advancing past soft diet  We will continue to follow.   LOS: 1 day    Kalman Drape , [facility] 06/23/2016, 11:24 AM Pager: [phone] Consults: [phone] Mon-Fri 7:00 am-4:30 pm Sat-Sun 7:00 am-11:30 am

## 2016-06-24 LAB — CBC
HEMATOCRIT: 35.4 % — AB (ref 36.0–46.0)
Hemoglobin: 11.4 g/dL — ABNORMAL LOW (ref 12.0–15.0)
MCH: 28 pg (ref 26.0–34.0)
MCHC: 32.2 g/dL (ref 30.0–36.0)
MCV: 87 fL (ref 78.0–100.0)
PLATELETS: 244 10*3/uL (ref 150–400)
RBC: 4.07 MIL/uL (ref 3.87–5.11)
RDW: 15.1 % (ref 11.5–15.5)
WBC: 11.5 10*3/uL — AB (ref 4.0–10.5)

## 2016-06-24 LAB — APTT: aPTT: 104 seconds — ABNORMAL HIGH (ref 24–36)

## 2016-06-24 LAB — GLUCOSE, CAPILLARY
GLUCOSE-CAPILLARY: 115 mg/dL — AB (ref 65–99)
GLUCOSE-CAPILLARY: 131 mg/dL — AB (ref 65–99)
Glucose-Capillary: 144 mg/dL — ABNORMAL HIGH (ref 65–99)
Glucose-Capillary: 129 mg/dL — ABNORMAL HIGH (ref 65–99)

## 2016-06-24 LAB — BASIC METABOLIC PANEL
Anion gap: 6 (ref 5–15)
BUN: 11 mg/dL (ref 6–20)
CALCIUM: 7.9 mg/dL — AB (ref 8.9–10.3)
CO2: 23 mmol/L (ref 22–32)
CREATININE: 0.77 mg/dL (ref 0.44–1.00)
Chloride: 112 mmol/L — ABNORMAL HIGH (ref 101–111)
Glucose, Bld: 133 mg/dL — ABNORMAL HIGH (ref 65–99)
Potassium: 3.5 mmol/L (ref 3.5–5.1)
SODIUM: 141 mmol/L (ref 135–145)

## 2016-06-24 LAB — HEPARIN LEVEL (UNFRACTIONATED): Heparin Unfractionated: 1.08 IU/mL — ABNORMAL HIGH (ref 0.30–0.70)

## 2016-06-24 MED ORDER — APIXABAN 5 MG PO TABS
5.0000 mg | ORAL_TABLET | Freq: Two times a day (BID) | ORAL | Status: DC
  Administered 2016-06-24 – 2016-07-01 (×15): 5 mg via ORAL
  Filled 2016-06-24 (×15): qty 1

## 2016-06-24 MED ORDER — POTASSIUM CHLORIDE CRYS ER 20 MEQ PO TBCR
40.0000 meq | EXTENDED_RELEASE_TABLET | Freq: Once | ORAL | Status: AC
  Administered 2016-06-24: 40 meq via ORAL
  Filled 2016-06-24: qty 2

## 2016-06-24 MED ORDER — PANTOPRAZOLE SODIUM 40 MG PO TBEC
40.0000 mg | DELAYED_RELEASE_TABLET | Freq: Every day | ORAL | Status: DC
  Administered 2016-06-25 – 2016-07-01 (×7): 40 mg via ORAL
  Filled 2016-06-24 (×7): qty 1

## 2016-06-24 MED ORDER — LORAZEPAM 0.5 MG PO TABS
0.2500 mg | ORAL_TABLET | Freq: Four times a day (QID) | ORAL | Status: DC | PRN
Start: 2016-06-24 — End: 2016-07-01
  Administered 2016-06-24: 0.25 mg via ORAL
  Filled 2016-06-24: qty 1

## 2016-06-24 MED ORDER — PRAVASTATIN SODIUM 40 MG PO TABS
40.0000 mg | ORAL_TABLET | Freq: Every day | ORAL | Status: DC
  Administered 2016-06-24 – 2016-06-30 (×7): 40 mg via ORAL
  Filled 2016-06-24 (×9): qty 1

## 2016-06-24 NOTE — Discharge Instructions (Signed)

## 2016-06-24 NOTE — Consult Note (Signed)
[facility] CM Primary Care Navigator  06/24/2016  [name] Dec 31, 1930 [id number]   Met with patient and husband [name]) at the bedside to identify possible discharge needs. Patient reports having episodes of vomiting that had led to this admission.  Patient endorses Dr. Jill Alexanders with [facility] as her primary care provider.   Patient shared using [place] at [facility] to obtain medications without any problem.   Patient verbalized managing her own medications at home using Financial [employer].  Patient reports she and her husband had been a residing at [facility] facility for 1 1/2 years. Her husband drives and provides transportation to her doctors' appointments but [facility] facility can provide transportation if needed after discharge as stated.  Patient is independent with self care and hoping to be the same after discharge, otherwise, husband will assist her per patient.   Discharge plan is back to [facility] .  Patient voiced understanding to call primary care provider's office when she gets back to [place], for a post discharge follow-up appointment within a week or sooner if needs arise. Patient letter (with PCP's contact number) was provided as a reminder.  Discussed with patient and husbandregarding [facility] care management services available for health management and agreed to receive[facility] calls to help manage pneumoniaat home.  Referral was made for [facility] Pneumoniacalls after discharge.   For additional questions please contact:  [name], BSN, RN-BC [facility] PRIMARY CARE Navigator Cell: [phone]

## 2016-06-24 NOTE — Progress Notes (Signed)
CM requested by pt to call [facility] to find out which home health agency is use for their residents. Pt  wants to know if home health services are ordered @ d/c will she be in network with her insurance. CM called and left voice message with Charlene BrookeCabin crew of Social Services Nino Parsley) @ [phone]. CM to f/u with pt once call back received. Whitman Hero RN, BSN,CM

## 2016-06-24 NOTE — Evaluation (Signed)
Physical Therapy Evaluation Patient Details Name: [name] MRN: [id number] DOB: [birth year] Today's Date: 06/24/2016   History of Present Illness  Pt is an 83 y/o female admitted secondary to acute gastric volvulus and intrathoracic gastric hernia. PMH includes TIA, HTN, and afib.    Clinical Impression  Pt admitted with above diagnosis. Pt currently with functional limitations due to the deficits listed below (see PT Problem List). Prior to admission, pt was independent with functional mobility activities. Upon evaluation, pt required min guard to supervision for normal gait, however, required min guard to min A for higher level balance challenges with gait. Recommending HHPT at [facility] for higher level balance and strengthening. Pt has recommended DME and assist at home. Pt will benefit from skilled PT to increase their independence and safety with mobility to allow discharge to the venue listed below.  Will continue to follow.      Follow Up Recommendations Home health PT;Supervision/Assistance - 24 hour (HHPT at [facility])    Equipment Recommendations  None recommended by PT    Recommendations for Other Services       Precautions / Restrictions Precautions Precautions: None Restrictions Weight Bearing Restrictions: No      Mobility  Bed Mobility               General bed mobility comments: In chair upon entry   Transfers Overall transfer level: Needs assistance Equipment used: None Transfers: Sit to/from Stand Sit to Stand: Min guard         General transfer comment: Min guard for steadying.  Ambulation/Gait Ambulation/Gait assistance: Min guard;Min assist;Supervision Ambulation Distance (Feet): 300 Feet Assistive device: None Gait Pattern/deviations: Step-through pattern;Decreased stride length;Drifts right/left;Wide base of support Gait velocity: Decreased Gait velocity interpretation: Below normal speed for age/gender General Gait Details: Pt  requiring min guard to supervision with normal gait. Pt demonstrating decreased balance, however, did not experience LOB. When performing higher level balance tasks pt required min guard to min A, especially with horizontal head turns, secondary to decreased balance to the L. Demonstrated change in gait speed with higher level balance deficits.   Stairs            Wheelchair Mobility    Modified Rankin (Stroke Patients Only)       Balance Overall balance assessment: Needs assistance Sitting-balance support: No upper extremity supported;Feet supported Sitting balance-Leahy Scale: Good     Standing balance support: No upper extremity supported Standing balance-Leahy Scale: Fair Standing balance comment: static balance                 Standardized Balance Assessment Standardized Balance Assessment : Dynamic Gait Index   Dynamic Gait Index Level Surface: Mild Impairment Gait with Horizontal Head Turns: Moderate Impairment Gait with Vertical Head Turns: Moderate Impairment Step Over Obstacle: Moderate Impairment Step Around Obstacles: Moderate Impairment       Pertinent Vitals/Pain Pain Assessment: No/denies pain    Home Living Family/patient expects to be discharged to:: Private residence Living Arrangements: Spouse/significant other Available Help at Discharge: Family;Available 24 hours/day Type of Home: House Home Access: Level entry     Home Layout: One level Home Equipment: Walker - 4 wheels;Grab bars - toilet;Grab bars - tub/shower Additional Comments: Live in retirement community at [facility] independent living.    Prior Function Level of Independence: Independent         Comments: At [facility] independent living. One meal a day provided, and they assist with house cleaning.      Hand  Dominance   Dominant Hand: Right    Extremity/Trunk Assessment   Upper Extremity Assessment Upper Extremity Assessment: Defer to OT evaluation    Lower  Extremity Assessment Lower Extremity Assessment: RLE deficits/detail;LLE deficits/detail RLE Deficits / Details: RLE grossly 4/5. Sensation in tact LLE Deficits / Details: LLE grossly 3+/5. Sensation in tact.        Communication   Communication: No difficulties  Cognition Arousal/Alertness: Awake/alert Behavior During Therapy: WFL for tasks assessed/performed Overall Cognitive Status: Within Functional Limits for tasks assessed                      General Comments General comments (skin integrity, edema, etc.): Pt required multiple verbal cues for stepping around obstacle task. Educated about need for follow up PT to address higher level balance deficits.     Exercises     Assessment/Plan    PT Assessment Patient needs continued PT services  PT Problem List Decreased strength;Decreased activity tolerance;Decreased balance;Decreased mobility;Decreased coordination       PT Treatment Interventions Gait training;Stair training;Functional mobility training;Therapeutic activities;Therapeutic exercise;Balance training;Neuromuscular re-education;Patient/family education    PT Goals (Current goals can be found in the Care Plan section)  Acute Rehab PT Goals Patient Stated Goal: to return home  PT Goal Formulation: With patient/family Time For Goal Achievement: 07/08/16 Potential to Achieve Goals: Good Additional Goals Additional Goal #1: Pt will perform head turns during ambulation and maintain balance, mod I, to simulate functional mobility in the community.     Frequency Min 3X/week   Barriers to discharge        Co-evaluation               End of Session Equipment Utilized During Treatment: Gait belt Activity Tolerance: Patient tolerated treatment well Patient left: in chair;with call bell/phone within reach;with family/visitor present Nurse Communication: Mobility status PT Visit Diagnosis: Unsteadiness on feet (R26.81);Muscle weakness (generalized)  (M62.81)         Time: 4163-8453 PT Time Calculation (min) (ACUTE ONLY): 23 min   Charges:   PT Evaluation $PT Eval Low Complexity: 1 Procedure PT Treatments $Gait Training: 8-22 mins   PT G Codes:         Mamie Levers 06/24/2016, 12:54 PM  Nicky Pugh, PT, DPT  Acute Rehabilitation Services  Pager: [phone]

## 2016-06-24 NOTE — Evaluation (Signed)
Occupational Therapy Evaluation Patient Details Name: [name] MRN: [id number] DOB: September 22, 1930 Today's Date: 06/24/2016    History of Present Illness Pt is an 83 y/o female admitted secondary to acute gastric volvulus and intrathoracic gastric hernia. PMH includes TIA, HTN, and afib.     Clinical Impression   Pt reports she was independent with ADL PTA. Currently pt overall supervision; quickly approaching mod I for ADL and functional mobility. Discussed home safety due to balance deficits and fall prevention. Pt planning to d/c home with 24/7 supervision from her husband. No further acute OT needs identified; signing off at this time. Please re-consult if needs change. Thank you for this referral.    Follow Up Recommendations  No OT follow up;Supervision - Intermittent    Equipment Recommendations  None recommended by OT    Recommendations for Other Services       Precautions / Restrictions Precautions Precautions: None Restrictions Weight Bearing Restrictions: No      Mobility Bed Mobility               General bed mobility comments: Pt OOB in chair upon arrival  Transfers Overall transfer level: Needs assistance Equipment used: None Transfers: Sit to/from Stand Sit to Stand: Supervision              Balance Overall balance assessment: Needs assistance Sitting-balance support: No upper extremity supported;Feet supported Sitting balance-Leahy Scale: Good     Standing balance support: No upper extremity supported;During functional activity Standing balance-Leahy Scale: Good                              ADL Overall ADL's : Needs assistance/impaired Eating/Feeding: Independent;Sitting   Grooming: Supervision/safety;Standing   Upper Body Bathing: Supervision/ safety;Sitting   Lower Body Bathing: Supervison/ safety;Sit to/from stand   Upper Body Dressing : Supervision/safety;Sitting   Lower Body Dressing: Supervision/safety;Sit  to/from stand   Toilet Transfer: Supervision/safety;Ambulation;Regular Toilet       Tub/ Banker: Supervision/safety;Walk-in shower;Ambulation   Functional mobility during ADLs: Supervision/safety General ADL Comments: Educated on fall prevention strategies and use of shower chair if needed.     Vision         Perception     Praxis      Pertinent Vitals/Pain Pain Assessment: No/denies pain     Hand Dominance Right   Extremity/Trunk Assessment Upper Extremity Assessment Upper Extremity Assessment: Overall WFL for tasks assessed   Lower Extremity Assessment Lower Extremity Assessment: Defer to PT evaluation   Cervical / Trunk Assessment Cervical / Trunk Assessment: Kyphotic   Communication Communication Communication: No difficulties   Cognition Arousal/Alertness: Awake/alert Behavior During Therapy: WFL for tasks assessed/performed Overall Cognitive Status: Within Functional Limits for tasks assessed                     General Comments       Exercises       Shoulder Instructions      Home Living Family/patient expects to be discharged to:: Private residence Living Arrangements: Spouse/significant other Available Help at Discharge: Family;Available 24 hours/day Type of Home: Independent living facility [facility]) Home Access: Level entry     Home Layout: One level     Bathroom Shower/Tub: Tub only;Walk-in shower   Bathroom Toilet: Handicapped height     Home Equipment: Walker - 4 wheels;Grab bars - toilet;Grab bars - tub/shower          Prior Functioning/Environment Level  of Independence: Independent        Comments: At [place] independent living. One meal a day provided, and they assist with house cleaning.         OT Problem List:        OT Treatment/Interventions:      OT Goals(Current goals can be found in the care plan section) Acute Rehab OT Goals Patient Stated Goal: to return home  OT Goal  Formulation: All assessment and education complete, DC therapy  OT Frequency:     Barriers to D/C:            Co-evaluation              End of Session    Activity Tolerance: Patient tolerated treatment well Patient left: in chair;with family/visitor present  OT Visit Diagnosis: Unsteadiness on feet (R26.81)                ADL either performed or assessed with clinical judgement  Time: 8295-6213 OT Time Calculation (min): 10 min Charges:  OT General Charges $OT Visit: 1 Procedure OT Evaluation $OT Eval Low Complexity: 1 Procedure G-Codes:     Adnan Vanvoorhis A. Ulice Brilliant, M.S., OTR/L Pager: [place] 06/24/2016, 4:49 PM

## 2016-06-24 NOTE — Progress Notes (Addendum)
[place] for Heparin>>apixaban Indication: chest pain/ACS and atrial fibrillation  Allergies  Allergen Reactions  . Lisinopril Other (See Comments)    DIZZY   Patient Measurements: Height: 5\' 6"  (167.6 cm) Weight: 171 lb 3.2 oz (77.7 kg) IBW/kg (Calculated) : 59.3 Heparin Dosing Weight: 74.6 kg  Vital Signs: Temp: 97.7 F (36.5 C) (03/08 1345) Temp Source: Oral (03/08 1345) BP: 110/71 (03/08 1345) Pulse Rate: 72 (03/08 1345)  Labs:  Recent Labs  06/21/16 2328  06/22/16 0753 06/22/16 1513 06/22/16 1816 06/23/16 0010 06/23/16 0722 06/23/16 1034 06/23/16 1930 06/24/16 0733  HGB 14.8  --   --   --   --  12.5  --   --   --  11.4*  HCT 44.3  --   --   --   --  37.9  --   --   --  35.4*  PLT 299  --   --   --   --  279  --   --   --  244  APTT  --   < > 31 34  --  87* 132*  --  71* 104*  LABPROT  --   --  16.3*  --   --   --   --   --   --   --   INR  --   --  1.31  --   --   --   --   --   --   --   HEPARINUNFRC  --   < > >2.20* 2.18*  --  1.80*  --   --   --  1.08*  CREATININE 0.98  --   --   --   --   --   --  0.80  --  0.77  TROPONINI  --   --  <0.03 <0.03 <0.03  --   --   --   --   --   < > = values in this interval not displayed.  Estimated Creatinine Clearance: 55.1 mL/min (by C-G formula based on SCr of 0.77 mg/dL).  Assessment: 83 y.o. female admitted with persistent N/V, h/o Afib and Eliquis has been on hold, d/t pt has been NPO, starting to advance diet as tolerated. Pharmacy consulted to dose restart apixaban. 32 yoF, wt 77 kg, and SCr 0.77  Goal of Therapy:  Anticoagulation Monitor platelets by anticoagulation protocol: Yes   Plan:  Stop heparin Restart apixaban 5 mg PO BID Continue to monitor renal function, H&H, and platelets Pharmacy will follow under anticoagulation protocol and will sign off on the consult.   Thank you for allowing [place] to participate in this patients care.  Jens Som,  PharmD Clinical phone for 06/24/2016 from 7a-3:30p: [phone] If after 3:30p, please call main pharmacy at: [phone] 06/24/2016 2:09 PM

## 2016-06-24 NOTE — Progress Notes (Signed)
PROGRESS NOTE    [name]  AST:[id number] DOB: 02-May-1930 DOA: 06/21/2016 PCP: Wyatt Haste, MD    Brief Narrative:  [name] is a 83 y.o. female with history of paroxysmal atrial fibrillation, hypertension, TIA presents to the ER because of persistent nausea vomiting with chest pain since last evening 6 PM. Patient states she was diagnosed with pneumonia last week and had completed a one-week course of Levaquin, last dose was yesterday. Last evening after having steak half an hour later patient started having nausea vomiting with left upper quadrant abdominal pain with chest pain radiating to the left shoulder. Patient had multiple episodes of vomiting. 2 days ago patient had mild diarrhea.   ED Course: In the ER EKG shows normal sinus rhythm with chest x-ray and KUB being unremarkable. Sonogram of the abdomen does not show any gallstones. Patient still persistently has nausea vomiting. Will be admitted for further observation. Patient was chest pain-free on admission.  Patient was admitted esophagram which was done was concerning for a gastric outlet obstruction versus small bowel obstruction. CT abdomen and pelvis was also done which did show large intrathoracic gastric hernia, no wall thickening or pneumatosis in the visualized stomach, findings most likely representing spontaneous interval reduction of a gastric volvulus. No evidence of gastric outlet obstruction or small bowel obstruction. General surgery was consulted and followed the patient during the hospitalization.    Assessment & Plan:   Principal Problem:   Acute gastric volvulus Active Problems:   Large hiatal hernia   Nausea & vomiting   Essential hypertension, benign   Paroxysmal atrial fibrillation (HCC)   Chest pain   Nausea and vomiting  #1 large hiatal show hernia probably would spontaneously reduced acute gastric volvulus As noted per esophagram and CT abdomen and pelvis. Patient with clinical  improvement with no further nausea or vomiting. Patient tolerating full liquid diet. Diet to be advanced slowly as tolerated to a soft diet per general surgical recommendations. Continue IV PPI. Patient will likely need elective outpatient repair with further workup with EGD, possible manometry, cardiac clearance and eventual upper endoscopy hernia repair/possible fundoplication per general surgery recommendations. General surgery following and appreciate input and recommendations.  #2 hypokalemia Repleted.  #3 paroxysmal atrial fibrillation Currently rate controlled on IV Lopressor. Transition to home dose oral metoprolol for rate control. Discontinue IV heparin and placed back on home regimen of eliquis for anticoagulation.   #4 dehydration HCTZ on hold. Gentle hydration.  #5 chest pain Secondary to problem #1. Resolved.  #6 hypertension Blood pressure currently stable. Patient has been transitioned to home dose oral Toprol. Discontinue IV Lopressor. Follow.  #7 history of TIA Stable. Continue anticoagulation for secondary stroke prevention.   DVT prophylaxis: Eliquis Code Status: Full Family Communication: Updated patient and family at bedside. Disposition Plan: Home when tolerating oral intake, continued bowel movements and per general surgery. Hopefully in the next 1-2 days.   Consultants:   Gen. surgery: Dr.Tsuei 06/22/2016  Procedures:   Esophagram 06/22/2016  CT abdomen and pelvis 06/22/2016  Chest x-ray 06/22/2016  Abdominal x-ray 06/22/2016  Antimicrobials:   None   Subjective: Patient tolerating full liquid diet. Patient denies any further nausea or emesis. Patient states passing flatus. Patient states had a bowel movement which was soft. No complaints.  Objective: Vitals:   06/23/16 2120 06/24/16 0228 06/24/16 0610 06/24/16 1345  BP: 133/65  (!) 124/59 110/71  Pulse: 78  74 72  Resp: 18  18 16   Temp: 99 F (  37.2 C)  99.1 F (37.3 C) 97.7 F (36.5  C)  TempSrc: Oral  Oral Oral  SpO2: 91%  94% 95%  Weight:  77.7 kg (171 lb 3.2 oz)    Height:        Intake/Output Summary (Last 24 hours) at 06/24/16 1601 Last data filed at 06/24/16 2585  Gross per 24 hour  Intake          2112.25 ml  Output                0 ml  Net          2112.25 ml   Filed Weights   06/21/16 2206 06/23/16 0619 06/24/16 0228  Weight: 75.8 kg (167 lb) 76.2 kg (168 lb 1.6 oz) 77.7 kg (171 lb 3.2 oz)    Examination:  General exam: Appears calm and comfortable  Respiratory system: Clear to auscultation. Respiratory effort normal. Cardiovascular system: S1 & S2 heard, RRR. No JVD, murmurs, rubs, gallops or clicks. No pedal edema. Gastrointestinal system: Abdomen is nondistended, soft and nontender. No organomegaly or masses felt. Normal bowel sounds heard. Central nervous system: Alert and oriented. No focal neurological deficits. Extremities: Symmetric 5 x 5 power. Skin: No rashes, lesions or ulcers Psychiatry: Judgement and insight appear normal. Mood & affect appropriate.     Data Reviewed: I have personally reviewed following labs and imaging studies  CBC:  Recent Labs Lab 06/21/16 2328 06/23/16 0010 06/24/16 0733  WBC 13.1* 13.5* 11.5*  NEUTROABS  --  7.0  --   HGB 14.8 12.5 11.4*  HCT 44.3 37.9 35.4*  MCV 86.4 87.7 87.0  PLT 299 279 277   Basic Metabolic Panel:  Recent Labs Lab 06/21/16 2328 06/22/16 1513 06/23/16 1034 06/24/16 0733  NA 139  --  143 141  K 3.2*  --  3.4* 3.5  CL 98*  --  111 112*  CO2 27  --  26 23  GLUCOSE 182*  --  107* 133*  BUN 17  --  21* 11  CREATININE 0.98  --  0.80 0.77  CALCIUM 9.3  --  8.3* 7.9*  MG  --  1.8 2.0  --    GFR: Estimated Creatinine Clearance: 55.1 mL/min (by C-G formula based on SCr of 0.77 mg/dL). Liver Function Tests:  Recent Labs Lab 06/21/16 2328 06/22/16 0753  AST 22 31  ALT 18 17  ALKPHOS 46 44  BILITOT 0.6 0.6  PROT 7.0 7.3  ALBUMIN 3.8 3.7    Recent Labs Lab  06/21/16 2328 06/22/16 0753  LIPASE 18 22   No results for input(s): AMMONIA in the last 168 hours. Coagulation Profile:  Recent Labs Lab 06/22/16 0753  INR 1.31   Cardiac Enzymes:  Recent Labs Lab 06/22/16 0753 06/22/16 1513 06/22/16 1816  TROPONINI <0.03 <0.03 <0.03   BNP (last 3 results) No results for input(s): PROBNP in the last 8760 hours. HbA1C: No results for input(s): HGBA1C in the last 72 hours. CBG:  Recent Labs Lab 06/23/16 1218 06/24/16 0030 06/24/16 0816 06/24/16 1511  GLUCAP 97 131* 115* 144*   Lipid Profile: No results for input(s): CHOL, HDL, LDLCALC, TRIG, CHOLHDL, LDLDIRECT in the last 72 hours. Thyroid Function Tests: No results for input(s): TSH, T4TOTAL, FREET4, T3FREE, THYROIDAB in the last 72 hours. Anemia Panel: No results for input(s): VITAMINB12, FOLATE, FERRITIN, TIBC, IRON, RETICCTPCT in the last 72 hours. Sepsis Labs: No results for input(s): PROCALCITON, LATICACIDVEN in the last 168 hours.  Recent Results (  from the past 240 hour(s))  Culture, Urine     Status: Abnormal   Collection Time: 06/22/16  8:23 AM  Result Value Ref Range Status   Specimen Description URINE, CLEAN CATCH  Final   Special Requests NONE  Final   Culture MULTIPLE SPECIES PRESENT, SUGGEST RECOLLECTION (A)  Final   Report Status 06/23/2016 FINAL  Final         Radiology Studies: No results found.      Scheduled Meds: . apixaban  5 mg Oral BID  . metoprolol succinate  50 mg Oral Daily  . pantoprazole (PROTONIX) IV  40 mg Intravenous Q24H   Continuous Infusions:    LOS: 2 days    Time spent: 103 minutes    Kailon Treese, MD [facility] Pager [phone]  If 7PM-7AM, please contact night-coverage [email or web address] Password [email or web address] 06/24/2016, 4:01 PM

## 2016-06-24 NOTE — Progress Notes (Signed)
[place] for Heparin Indication: chest pain/ACS  Allergies  Allergen Reactions  . Lisinopril Other (See Comments)    DIZZY   Patient Measurements: Height: 5\' 6"  (167.6 cm) Weight: 171 lb 3.2 oz (77.7 kg) IBW/kg (Calculated) : 59.3 Heparin Dosing Weight: 74.6 kg  Vital Signs: Temp: 99.1 F (37.3 C) (03/08 0610) Temp Source: Oral (03/08 0610) BP: 124/59 (03/08 0610) Pulse Rate: 74 (03/08 0610)  Labs:  Recent Labs  06/21/16 2328  06/22/16 0753 06/22/16 1513 06/22/16 1816 06/23/16 0010 06/23/16 0722 06/23/16 1034 06/23/16 1930 06/24/16 0733  HGB 14.8  --   --   --   --  12.5  --   --   --  11.4*  HCT 44.3  --   --   --   --  37.9  --   --   --  35.4*  PLT 299  --   --   --   --  279  --   --   --  244  APTT  --   < > 31 34  --  87* 132*  --  71* 104*  LABPROT  --   --  16.3*  --   --   --   --   --   --   --   INR  --   --  1.31  --   --   --   --   --   --   --   HEPARINUNFRC  --   < > >2.20* 2.18*  --  1.80*  --   --   --  1.08*  CREATININE 0.98  --   --   --   --   --   --  0.80  --  0.77  TROPONINI  --   --  <0.03 <0.03 <0.03  --   --   --   --   --   < > = values in this interval not displayed.  Estimated Creatinine Clearance: 55.1 mL/min (by C-G formula based on SCr of 0.77 mg/dL).  Assessment: 83 y.o. female admitted with persistent N/V, h/o Afib and Eliquis on hold, pt has been NPO, starting to advance diet as tolerated. Pharmacy consulted to dose heparin. Continuing heparin for now until patient tolerating diet.  aPTT slightly supratherapeutic this morning at 104. Rn reports no bleeding or infusion issues.   Goal of Therapy:  aPTT 66-102 while Eliquis affecting heparin level Heparin level 0.3-0.7 units/ml Monitor platelets by anticoagulation protocol: Yes   Plan:  Reduce heparin slightly to 900 units/hr  Check anti-Xa level daily while on heparin Check aPTT daily until anti-xa level correlates Continue to monitor  H&H and platelets   Thank you for allowing [place] to participate in this patients care.  Jens Som, PharmD Clinical phone for 06/24/2016 from 7a-3:30p: [phone] If after 3:30p, please call main pharmacy at: [phone] 06/24/2016 9:59 AM

## 2016-06-24 NOTE — Progress Notes (Signed)
[facility] Progress Note     Subjective: Pt tolerated clears. No acute events overnight. No BM but is having flatus.  Objective: Vital signs in last 24 hours: Temp:  [98.3 F (36.8 C)-99.1 F (37.3 C)] 99.1 F (37.3 C) (03/08 0610) Pulse Rate:  [73-78] 74 (03/08 0610) Resp:  [18] 18 (03/08 0610) BP: (124-149)/(59-65) 124/59 (03/08 0610) SpO2:  [91 %-97 %] 94 % (03/08 0610) Weight:  [171 lb 3.2 oz (77.7 kg)] 171 lb 3.2 oz (77.7 kg) (03/08 0228) Last BM Date: 06/22/16  Intake/Output from previous day: 03/07 0701 - 03/08 0700 In: 2292.3 [P.O.:500; I.V.:1792.3] Out: 625 [Urine:625] Intake/Output this shift: No intake/output data recorded.  PE: Gen:  Alert, NAD, pleasant, cooperative, well appearing Card:  RRR, no M/G/R heard Pulm:  Rate and effort normal Abd: Soft, NT/ND, +BS Skin: no rashes noted, warm and dry   Lab Results:   Recent Labs  06/23/16 0010 06/24/16 0733  WBC 13.5* 11.5*  HGB 12.5 11.4*  HCT 37.9 35.4*  PLT 279 244   BMET  Recent Labs  06/21/16 2328 06/23/16 1034  NA 139 143  K 3.2* 3.4*  CL 98* 111  CO2 27 26  GLUCOSE 182* 107*  BUN 17 21*  CREATININE 0.98 0.80  CALCIUM 9.3 8.3*   PT/INR  Recent Labs  06/22/16 0753  LABPROT 16.3*  INR 1.31   CMP     Component Value Date/Time   NA 143 06/23/2016 1034   K 3.4 (L) 06/23/2016 1034   CL 111 06/23/2016 1034   CO2 26 06/23/2016 1034   GLUCOSE 107 (H) 06/23/2016 1034   BUN 21 (H) 06/23/2016 1034   CREATININE 0.80 06/23/2016 1034   CREATININE 0.86 01/15/2016 1016   CALCIUM 8.3 (L) 06/23/2016 1034   PROT 7.3 06/22/2016 0753   ALBUMIN 3.7 06/22/2016 0753   AST 31 06/22/2016 0753   ALT 17 06/22/2016 0753   ALKPHOS 44 06/22/2016 0753   BILITOT 0.6 06/22/2016 0753   GFRNONAA >60 06/23/2016 1034   GFRAA >60 06/23/2016 1034   Lipase     Component Value Date/Time   LIPASE 22 06/22/2016 0753       Studies/Results: Ct Abdomen Pelvis W Contrast  Result Date:  06/22/2016 CLINICAL DATA:  Vomiting for 2 days. Abdominal pain. Clinical concern for gastric outlet obstruction on upper GI from earlier today. EXAM: CT ABDOMEN AND PELVIS WITH CONTRAST TECHNIQUE: Multidetector CT imaging of the abdomen and pelvis was performed using the standard protocol following bolus administration of intravenous contrast. CONTRAST:  100 cc ISOVUE-300 IOPAMIDOL (ISOVUE-300) INJECTION 61% COMPARISON:  Upper GI and abdominal radiograph from earlier today. FINDINGS: Visualization of the upper abdomen is limited by streak artifact from barium in the stomach. Lower chest: Trace bilateral dependent pleural effusions. Compressive atelectasis in the medial and dependent lower lobes bilaterally. Hepatobiliary: Diffuse hepatic steatosis. Normal liver size. No liver mass. Normal gallbladder with no radiopaque cholelithiasis. No biliary ductal dilatation. Pancreas: Normal, with no mass or duct dilation. Spleen: Normal size. No mass. Adrenals/Urinary Tract: Normal adrenals. No hydronephrosis. Mild segmental renal cortical scarring in the posterior upper right kidney. No renal mass. Normal bladder. Stomach/Bowel: There is a large intrathoracic gastric hernia, which is only partially visualized on this CT abdomen study. There is retained barium contrast and food debris in the mildly distended visualized intrathoracic portion of the stomach. No gastric wall thickening or pneumatosis is seen in the visualized stomach. The distal intra- abdominal portion of the stomach is collapsed  and grossly normal. The duodenum is relatively collapsed and normal. Normal caliber small bowel with no small bowel wall thickening. Oral contrast progresses to the colon. Normal appendix. Marked sigmoid diverticulosis, with no large bowel wall thickening or pericolonic fat stranding. Vascular/Lymphatic: Atherosclerotic nonaneurysmal abdominal aorta. Patent portal, splenic, hepatic and renal veins. No pathologically enlarged lymph nodes  in the abdomen or pelvis. Reproductive: Status post hysterectomy, with no abnormal findings at the vaginal cuff. No adnexal mass. Other: No pneumoperitoneum, ascites or focal fluid collection. Musculoskeletal: No aggressive appearing focal osseous lesions. Marked thoracolumbar spondylosis with mild to moderate levocurvature of the thoracolumbar spine . IMPRESSION: 1. Large intrathoracic gastric hernia, only partially visualized on this CT abdomen study. Intrathoracic stomach is only mildly distended and barium contrast progresses to the colon. No wall thickening or pneumatosis in the visualized stomach. Findings most likely represent spontaneous interval reduction of a gastric volvulus. No evidence of gastric outlet obstruction or small bowel obstruction on this CT study. A follow-up upper GI could be obtained on a short-term basis as clinically warranted. 2. Trace bilateral dependent pleural effusions with bibasilar atelectasis . 3. Additional findings include aortic atherosclerosis, diffuse hepatic steatosis and marked sigmoid diverticulosis. Electronically Signed   By: Ilona Sorrel M.D.   On: 06/22/2016 14:23   Dg Esophagus  Result Date: 06/22/2016 CLINICAL DATA:  Persistent vomiting. Impaction? UNABLE TO SWALLOW ANYTHING SINCE YESTERDAY VOMITING, REGURITATION EXAM: ESOPHOGRAM/BARIUM SWALLOW TECHNIQUE: Single contrast examination was performed using thin barium or water soluble. FLUOROSCOPY TIME:  Fluoroscopy Time:  3 minutes and 6 seconds Number of Acquired Spot Images: 2 COMPARISON:  Chest x-ray from earlier same day. FINDINGS: Barium contrast material was administered orally during fluoroscopic examination. There is prompt flow of the contrast to the level of the distal esophagus. There is a very delayed flow of the contrast from the distal esophagus to the stomach. The stomach appears distended with ingested materials blocking flow of the contrast material beyond the stomach cardia. Findings on today's  exam, combined with the appearance on earlier chest x-ray, raises the possibility of large paraesophageal hiatal hernia or gastric volvulus. IMPRESSION: Contrast is obstructed at the level of the stomach cardia. There is an associated delay in the passage of contrast from the lower esophagus to the stomach. Stomach appears to be nearly completely field with ingested materials blocking the flow of contrast. Findings suggest either proximal small bowel obstruction or gastric outlet obstruction. Findings on today's exam, combined with the appearance on earlier chest x-ray, also raise the possibility of paraesophageal hiatal hernia or gastric volvulus. Recommend CT abdomen and pelvis for further characterization. These results were called by telephone at the time of interpretation on 06/22/2016 at 9:14 am to Dr. Grandville Silos, who verbally acknowledged these results. Electronically Signed   By: Franki Cabot M.D.   On: 06/22/2016 09:15    Anti-infectives: Anti-infectives    None       Assessment/Plan  Hypokalemia Paroxysmal atrial fib - Holding eliquis and on heparin Hypertension Hx of TIA  Large Intrathoracic Gastric Hernia - contrast in colon, pt not obstructed - no surgical intervention at this time, suggest outpt f/u with Dr. Emogene Morgan - clear liquids and advance diet as tolerated. Would recommend not advancing past soft diet  Recommend outpatient work-up with EGD, possible manometry, cardiac clearance, and eventually laparoscopic hiatal hernia repair/ possible fundoplication. Will slowly advance diet and will arrange follow-up after discharge.  We will continue to follow.   LOS: 2 days    [name] L  [name] [facility] Surgery 06/24/2016, 8:10 AM Pager: [phone] Consults: [phone] Mon-Fri 7:00 am-4:30 pm Sat-Sun 7:00 am-11:30 am

## 2016-06-25 ENCOUNTER — Inpatient Hospital Stay (HOSPITAL_COMMUNITY): Payer: Medicare Other

## 2016-06-25 DIAGNOSIS — J189 Pneumonia, unspecified organism: Secondary | ICD-10-CM | POA: Diagnosis present

## 2016-06-25 DIAGNOSIS — E8770 Fluid overload, unspecified: Secondary | ICD-10-CM

## 2016-06-25 LAB — BASIC METABOLIC PANEL
ANION GAP: 7 (ref 5–15)
BUN: 10 mg/dL (ref 6–20)
CO2: 24 mmol/L (ref 22–32)
Calcium: 8.5 mg/dL — ABNORMAL LOW (ref 8.9–10.3)
Chloride: 110 mmol/L (ref 101–111)
Creatinine, Ser: 0.81 mg/dL (ref 0.44–1.00)
Glucose, Bld: 138 mg/dL — ABNORMAL HIGH (ref 65–99)
POTASSIUM: 4.2 mmol/L (ref 3.5–5.1)
SODIUM: 141 mmol/L (ref 135–145)

## 2016-06-25 LAB — STREP PNEUMONIAE URINARY ANTIGEN: STREP PNEUMO URINARY ANTIGEN: NEGATIVE

## 2016-06-25 LAB — CBC
HCT: 36.4 % (ref 36.0–46.0)
HEMOGLOBIN: 11.6 g/dL — AB (ref 12.0–15.0)
MCH: 27.9 pg (ref 26.0–34.0)
MCHC: 31.9 g/dL (ref 30.0–36.0)
MCV: 87.5 fL (ref 78.0–100.0)
PLATELETS: 287 10*3/uL (ref 150–400)
RBC: 4.16 MIL/uL (ref 3.87–5.11)
RDW: 15.2 % (ref 11.5–15.5)
WBC: 15.3 10*3/uL — AB (ref 4.0–10.5)

## 2016-06-25 LAB — GLUCOSE, CAPILLARY
GLUCOSE-CAPILLARY: 131 mg/dL — AB (ref 65–99)
GLUCOSE-CAPILLARY: 141 mg/dL — AB (ref 65–99)
Glucose-Capillary: 170 mg/dL — ABNORMAL HIGH (ref 65–99)

## 2016-06-25 LAB — MRSA PCR SCREENING: MRSA BY PCR: NEGATIVE

## 2016-06-25 MED ORDER — PIPERACILLIN-TAZOBACTAM 3.375 G IVPB
3.3750 g | Freq: Three times a day (TID) | INTRAVENOUS | Status: DC
  Administered 2016-06-25 – 2016-06-29 (×12): 3.375 g via INTRAVENOUS
  Filled 2016-06-25 (×13): qty 50

## 2016-06-25 MED ORDER — FUROSEMIDE 10 MG/ML IJ SOLN
40.0000 mg | Freq: Once | INTRAMUSCULAR | Status: AC
  Administered 2016-06-25: 40 mg via INTRAVENOUS
  Filled 2016-06-25: qty 4

## 2016-06-25 MED ORDER — FUROSEMIDE 10 MG/ML IJ SOLN
40.0000 mg | Freq: Two times a day (BID) | INTRAMUSCULAR | Status: DC
  Administered 2016-06-26 – 2016-06-30 (×9): 40 mg via INTRAVENOUS
  Filled 2016-06-25 (×9): qty 4

## 2016-06-25 MED ORDER — LEVALBUTEROL HCL 0.63 MG/3ML IN NEBU
INHALATION_SOLUTION | RESPIRATORY_TRACT | Status: AC
  Administered 2016-06-25: 0.63 mg
  Filled 2016-06-25: qty 3

## 2016-06-25 MED ORDER — POTASSIUM CHLORIDE CRYS ER 20 MEQ PO TBCR
40.0000 meq | EXTENDED_RELEASE_TABLET | Freq: Once | ORAL | Status: AC
  Administered 2016-06-25: 40 meq via ORAL
  Filled 2016-06-25: qty 2

## 2016-06-25 NOTE — Progress Notes (Signed)
  Subjective: Patient was some shortness of breath and anxious earlier today. Better with breathing treatment. She reports tolerating her diet without difficulty. Some anxiety with shortness of breath and recurrent of her PAF last evening.  Objective: Vital signs in last 24 hours: Temp:  [97.7 F (36.5 C)-98.6 F (37 C)] 98.6 F (37 C) (03/09 5188) Pulse Rate:  [72-154] 77 (03/09 0613) Resp:  [16-22] 22 (03/09 0613) BP: (110-145)/(65-95) 142/65 (03/09 0613) SpO2:  [91 %-95 %] 91 % (03/09 0613) Weight:  [77.9 kg (171 lb 12.8 oz)] 77.9 kg (171 lb 12.8 oz) (03/09 0542) Last BM Date: 06/24/16 240 PO recorded Afebrile some tachycardia last evening, blood pressure okay Chest x-ray this a.m. shows large hiatal hernia is increasing pain left basilar infiltrate and effusion Intake/Output from previous day: 03/08 0701 - 03/09 0700 In: 240 [P.O.:240] Out: -  Intake/Output this shift: No intake/output data recorded.  General appearance: alert, cooperative and no distress GI: soft, non-tender; bowel sounds normal; no masses,  no organomegaly  Lab Results:   Recent Labs  06/24/16 0733 06/25/16 0849  WBC 11.5* 15.3*  HGB 11.4* 11.6*  HCT 35.4* 36.4  PLT 244 287    BMET  Recent Labs  06/24/16 0733 06/25/16 0849  NA 141 141  K 3.5 4.2  CL 112* 110  CO2 23 24  GLUCOSE 133* 138*  BUN 11 10  CREATININE 0.77 0.81  CALCIUM 7.9* 8.5*   PT/INR No results for input(s): LABPROT, INR in the last 72 hours.   Recent Labs Lab 06/21/16 2328 06/22/16 0753  AST 22 31  ALT 18 17  ALKPHOS 46 44  BILITOT 0.6 0.6  PROT 7.0 7.3  ALBUMIN 3.8 3.7     Lipase     Component Value Date/Time   LIPASE 22 06/22/2016 0753     Studies/Results: Dg Chest 2 View  Result Date: 06/25/2016 CLINICAL DATA:  Shortness of breath and weakness EXAM: CHEST  2 VIEW COMPARISON:  06/21/2016 FINDINGS: Cardiac shadow is enlarged. A large hiatal hernia is again seen and stable. Increasing left basilar  infiltrate and effusion is seen compared with the prior exam. No acute bony abnormality is noted. IMPRESSION: Increasing left basilar infiltrate. Electronically Signed   By: Inez Catalina M.D.   On: 06/25/2016 10:53    Medications: . apixaban  5 mg Oral BID  . metoprolol succinate  50 mg Oral Daily  . pantoprazole  40 mg Oral Q0600  . pravastatin  40 mg Oral Daily     Assessment/Plan Large intrathoracic gastric hernia Hypokalemia Paroxysmal atrial fib - Holding eliquis and on heparin Hypertension Hx of TIA FEN: Soft diet ID:  NO abx DVT Eliquis   Plan: Home when medically stable. She has follow-up with Dr. Rosendo Gros next week in our office.    LOS: 3 days    [name] 06/25/2016 [phone]

## 2016-06-25 NOTE — Progress Notes (Signed)
PT Cancellation Note  Patient Details Name: [name] MRN: [id number] DOB: 12/18/1930   Cancelled Treatment:    Reason Eval/Treat Not Completed: Fatigue/lethargy limiting ability to participate (Pt refused mobility despite education and encouragement, PTA assisted patient in scooting up in bed for improved positioning.   )   Lillyth Spong Eli Hose 06/25/2016, 4:53 PM Governor Rooks, PTA pager [phone]

## 2016-06-25 NOTE — Progress Notes (Signed)
Pt anxious and SOB with slight exp wheeze. RT gave treatment and saw small improvement. RN aware

## 2016-06-25 NOTE — Progress Notes (Signed)
PROGRESS NOTE    [name]  YHC:[id number] DOB: 07-Jul-1930 DOA: 06/21/2016 PCP: Wyatt Haste, MD    Brief Narrative:  [name] is a 83 y.o. female with history of paroxysmal atrial fibrillation, hypertension, TIA presents to the ER because of persistent nausea vomiting with chest pain since last evening 6 PM. Patient states she was diagnosed with pneumonia last week and had completed a one-week course of Levaquin, last dose was yesterday. Last evening after having steak half an hour later patient started having nausea vomiting with left upper quadrant abdominal pain with chest pain radiating to the left shoulder. Patient had multiple episodes of vomiting. 2 days ago patient had mild diarrhea.   ED Course: In the ER EKG shows normal sinus rhythm with chest x-ray and KUB being unremarkable. Sonogram of the abdomen does not show any gallstones. Patient still persistently has nausea vomiting. Will be admitted for further observation. Patient was chest pain-free on admission.  Patient was admitted esophagram which was done was concerning for a gastric outlet obstruction versus small bowel obstruction. CT abdomen and pelvis was also done which did show large intrathoracic gastric hernia, no wall thickening or pneumatosis in the visualized stomach, findings most likely representing spontaneous interval reduction of a gastric volvulus. No evidence of gastric outlet obstruction or small bowel obstruction. General surgery was consulted and followed the patient during the hospitalization.    Assessment & Plan:   Principal Problem:   Acute gastric volvulus Active Problems:   Large hiatal hernia   Nausea & vomiting   Essential hypertension, benign   Paroxysmal atrial fibrillation (HCC)   Chest pain   Nausea and vomiting   PNA (pneumonia)   Hypervolemia  #1 large hiatal show hernia probably would spontaneously reduced acute gastric volvulus As noted per esophagram and CT abdomen  and pelvis. Patient with clinical improvement with no further nausea or vomiting. Patient tolerating soft diet. Continue PPI. Patient will likely need elective outpatient repair with further workup with EGD, possible manometry, cardiac clearance and eventual upper endoscopy hernia repair/possible fundoplication per general surgery recommendations. General surgery following and appreciate input and recommendations.  #2 probable aspiration pneumonia/volume overload Patient noted to be short of breath overnight. Patient noted to have a worsening leukocytosis with a white count of 15.3. Patient had presented with nausea and vomiting secondary to large facial hernia and spontaneously reduced acute gastric volvulus and as such may have aspirated at that time. Patient currently afebrile. Chest x-ray with worsening left basilar infiltrate. Patient also was nothing by mouth during initial part of the hospitalization and hydrated with IV fluids. Patient is approximately positive for the dizziness on this admission. Will check a urine Legionella antigen. Check a urine pneumococcus antigen. Check blood cultures 2. MRSA screening nasal mucosa. Place empirically on IV Zosyn. Will give Lasix 40 mg IV every 12 hours 2 doses and reassess volume status in the morning.  #3 hypokalemia Repleted.  #4 paroxysmal atrial fibrillation Patient transitioned from IV Lopressor to oral home dose Toprol for rate control. Patient noted to have elevated heart rates overnight which has since resolved. Patient has been transitioned from IV heparin back to home regimen of eliquis for anticoagulation.   #5 dehydration HCTZ on hold. Saline lock IV fluids.  #6 chest pain Secondary to problem #1. Resolved.  #7 hypertension Blood pressure currently stable. Patient has been transitioned to home dose oral Toprol. HCTZ on hold. Discontinued IV Lopressor. Follow.  #8 history of TIA Stable. Continue anticoagulation  for secondary stroke  prevention.   DVT prophylaxis: Eliquis Code Status: Full Family Communication: Updated patient and family at bedside. Disposition Plan: Home when tolerating oral intake, continued bowel movements and per general surgery, improvement with shortness of breath and leukocytosis.Marland Kitchen Hopefully in the next 1-2 days.   Consultants:   Gen. surgery: Dr.Tsuei 06/22/2016  Procedures:   Esophagram 06/22/2016  CT abdomen and pelvis 06/22/2016  Chest x-ray 06/22/2016, 06/25/2016  Abdominal x-ray 06/22/2016  Antimicrobials:   IV Zosyn 06/25/2016   Subjective: Patient states had significant shortness of breath overnight and palpitations. Patient currently tolerating a soft diet. Patient passing flatus. Patient noted to have a bowel movement yesterday.   Objective: Vitals:   06/25/16 0227 06/25/16 0542 06/25/16 0613 06/25/16 1500  BP:   (!) 142/65 135/65  Pulse:   77 75  Resp:   (!) 22 (!) 26  Temp:   98.6 F (37 C) 99.2 F (37.3 C)  TempSrc:    Oral  SpO2: 94%  91% 96%  Weight:  77.9 kg (171 lb 12.8 oz)    Height:        Intake/Output Summary (Last 24 hours) at 06/25/16 1833 Last data filed at 06/25/16 1720  Gross per 24 hour  Intake              120 ml  Output              900 ml  Net             -780 ml   Filed Weights   06/23/16 0619 06/24/16 0228 06/25/16 0542  Weight: 76.2 kg (168 lb 1.6 oz) 77.7 kg (171 lb 3.2 oz) 77.9 kg (171 lb 12.8 oz)    Examination:  General exam: Appears calm and comfortable  Respiratory system: Diffuse crackles. Some coarse breath sounds in the left base. Respiratory effort normal. Cardiovascular system: S1 & S2 heard, RRR. No JVD, murmurs, rubs, gallops or clicks. No pedal edema. Gastrointestinal system: Abdomen is nondistended, soft and nontender. No organomegaly or masses felt. Normal bowel sounds heard. Central nervous system: Alert and oriented. No focal neurological deficits. Extremities: Symmetric 5 x 5 power. Skin: No rashes,  lesions or ulcers Psychiatry: Judgement and insight appear normal. Mood & affect appropriate.     Data Reviewed: I have personally reviewed following labs and imaging studies  CBC:  Recent Labs Lab 06/21/16 2328 06/23/16 0010 06/24/16 0733 06/25/16 0849  WBC 13.1* 13.5* 11.5* 15.3*  NEUTROABS  --  7.0  --   --   HGB 14.8 12.5 11.4* 11.6*  HCT 44.3 37.9 35.4* 36.4  MCV 86.4 87.7 87.0 87.5  PLT 299 279 244 009   Basic Metabolic Panel:  Recent Labs Lab 06/21/16 2328 06/22/16 1513 06/23/16 1034 06/24/16 0733 06/25/16 0849  NA 139  --  143 141 141  K 3.2*  --  3.4* 3.5 4.2  CL 98*  --  111 112* 110  CO2 27  --  26 23 24   GLUCOSE 182*  --  107* 133* 138*  BUN 17  --  21* 11 10  CREATININE 0.98  --  0.80 0.77 0.81  CALCIUM 9.3  --  8.3* 7.9* 8.5*  MG  --  1.8 2.0  --   --    GFR: Estimated Creatinine Clearance: 54.4 mL/min (by C-G formula based on SCr of 0.81 mg/dL). Liver Function Tests:  Recent Labs Lab 06/21/16 2328 06/22/16 0753  AST 22 31  ALT 18 17  ALKPHOS 46 44  BILITOT 0.6 0.6  PROT 7.0 7.3  ALBUMIN 3.8 3.7    Recent Labs Lab 06/21/16 2328 06/22/16 0753  LIPASE 18 22   No results for input(s): AMMONIA in the last 168 hours. Coagulation Profile:  Recent Labs Lab 06/22/16 0753  INR 1.31   Cardiac Enzymes:  Recent Labs Lab 06/22/16 0753 06/22/16 1513 06/22/16 1816  TROPONINI <0.03 <0.03 <0.03   BNP (last 3 results) No results for input(s): PROBNP in the last 8760 hours. HbA1C: No results for input(s): HGBA1C in the last 72 hours. CBG:  Recent Labs Lab 06/24/16 0816 06/24/16 1511 06/24/16 2320 06/25/16 0849 06/25/16 1639  GLUCAP 115* 144* 129* 131* 141*   Lipid Profile: No results for input(s): CHOL, HDL, LDLCALC, TRIG, CHOLHDL, LDLDIRECT in the last 72 hours. Thyroid Function Tests: No results for input(s): TSH, T4TOTAL, FREET4, T3FREE, THYROIDAB in the last 72 hours. Anemia Panel: No results for input(s): VITAMINB12,  FOLATE, FERRITIN, TIBC, IRON, RETICCTPCT in the last 72 hours. Sepsis Labs: No results for input(s): PROCALCITON, LATICACIDVEN in the last 168 hours.  Recent Results (from the past 240 hour(s))  Culture, Urine     Status: Abnormal   Collection Time: 06/22/16  8:23 AM  Result Value Ref Range Status   Specimen Description URINE, CLEAN CATCH  Final   Special Requests NONE  Final   Culture MULTIPLE SPECIES PRESENT, SUGGEST RECOLLECTION (A)  Final   Report Status 06/23/2016 FINAL  Final  MRSA PCR Screening     Status: None   Collection Time: 06/25/16 11:11 AM  Result Value Ref Range Status   MRSA by PCR NEGATIVE NEGATIVE Final    Comment:        The GeneXpert MRSA Assay (FDA approved for NASAL specimens only), is one component of a comprehensive MRSA colonization surveillance program. It is not intended to diagnose MRSA infection nor to guide or monitor treatment for MRSA infections.          Radiology Studies: Dg Chest 2 View  Result Date: 06/25/2016 CLINICAL DATA:  Shortness of breath and weakness EXAM: CHEST  2 VIEW COMPARISON:  06/21/2016 FINDINGS: Cardiac shadow is enlarged. A large hiatal hernia is again seen and stable. Increasing left basilar infiltrate and effusion is seen compared with the prior exam. No acute bony abnormality is noted. IMPRESSION: Increasing left basilar infiltrate. Electronically Signed   By: Inez Catalina M.D.   On: 06/25/2016 10:53        Scheduled Meds: . apixaban  5 mg Oral BID  . metoprolol succinate  50 mg Oral Daily  . pantoprazole  40 mg Oral Q0600  . piperacillin-tazobactam (ZOSYN)  IV  3.375 g Intravenous Q8H  . pravastatin  40 mg Oral Daily   Continuous Infusions:    LOS: 3 days    Time spent: 72 minutes    Sianna Garofano, MD [facility] Pager [phone]  If 7PM-7AM, please contact night-coverage [email or web address] Password [facility] 06/25/2016, 6:33 PM

## 2016-06-25 NOTE — Care Management Important Message (Signed)
Important Message  Patient Details  Name: [name] MRN: [id number] Date of Birth: [birth year]   Medicare Important Message Given:  Yes    [name] May 06/25/2016, 2:22 PM

## 2016-06-25 NOTE — Progress Notes (Signed)
Pharmacy Antibiotic Note  [name] is a 83 y.o. female admitted on 06/21/2016 with nausea and vomiting, found to have large intrathoracic gastric hernia.  Now with concern for aspiration PNA and Pharmacy has been consulted for Zosyn dosing.  Patient has recently completed a course of Levaquin.  Her renal function is stable.  She is afebrile and her WBC is elevated at 15.3.   Plan: - Zosyn 3.375gm IV Q8H, 4 hr infusion - Pharmacy will sign off and follow peripherally.  Thank you for the consult!   Height: 5\' 6"  (167.6 cm) Weight: 171 lb 12.8 oz (77.9 kg) IBW/kg (Calculated) : 59.3  Temp (24hrs), Avg:98.2 F (36.8 C), Min:97.7 F (36.5 C), Max:98.6 F (37 C)   Recent Labs Lab 06/21/16 2328 06/23/16 0010 06/23/16 1034 06/24/16 0733 06/25/16 0849  WBC 13.1* 13.5*  --  11.5* 15.3*  CREATININE 0.98  --  0.80 0.77 0.81    Estimated Creatinine Clearance: 54.4 mL/min (by C-G formula based on SCr of 0.81 mg/dL).    Allergies  Allergen Reactions  . Lisinopril Other (See Comments)    DIZZY    Antimicrobials this admission: Zosyn 3/9 >>  Dose adjustments this admission: N/A  Microbiology results: 3/6 UCx - negative 3/9 BCx x2 -   Delita Chiquito D. Mina Marble, PharmD, BCPS Pager:  [phone] 06/25/2016, 11:22 AM

## 2016-06-26 ENCOUNTER — Inpatient Hospital Stay (HOSPITAL_COMMUNITY): Payer: Medicare Other

## 2016-06-26 DIAGNOSIS — R0602 Shortness of breath: Secondary | ICD-10-CM

## 2016-06-26 DIAGNOSIS — I5033 Acute on chronic diastolic (congestive) heart failure: Secondary | ICD-10-CM

## 2016-06-26 DIAGNOSIS — I10 Essential (primary) hypertension: Secondary | ICD-10-CM

## 2016-06-26 DIAGNOSIS — R748 Abnormal levels of other serum enzymes: Secondary | ICD-10-CM

## 2016-06-26 DIAGNOSIS — R778 Other specified abnormalities of plasma proteins: Secondary | ICD-10-CM

## 2016-06-26 DIAGNOSIS — I5031 Acute diastolic (congestive) heart failure: Secondary | ICD-10-CM | POA: Insufficient documentation

## 2016-06-26 DIAGNOSIS — R7989 Other specified abnormal findings of blood chemistry: Secondary | ICD-10-CM

## 2016-06-26 DIAGNOSIS — I480 Paroxysmal atrial fibrillation: Secondary | ICD-10-CM

## 2016-06-26 LAB — BASIC METABOLIC PANEL
Anion gap: 12 (ref 5–15)
BUN: 9 mg/dL (ref 6–20)
CHLORIDE: 102 mmol/L (ref 101–111)
CO2: 25 mmol/L (ref 22–32)
Calcium: 8.3 mg/dL — ABNORMAL LOW (ref 8.9–10.3)
Creatinine, Ser: 0.91 mg/dL (ref 0.44–1.00)
GFR calc Af Amer: >60 mL/min (ref >60)
GFR calc non Af Amer: 56 mL/min — ABNORMAL LOW (ref >60)
GLUCOSE: 157 mg/dL — AB (ref 65–99)
POTASSIUM: 3.2 mmol/L — AB (ref 3.5–5.1)
Sodium: 139 mmol/L (ref 135–145)

## 2016-06-26 LAB — CBC
HEMATOCRIT: 37.3 % (ref 36.0–46.0)
HEMOGLOBIN: 12.2 g/dL (ref 12.0–15.0)
MCH: 28.2 pg (ref 26.0–34.0)
MCHC: 32.7 g/dL (ref 30.0–36.0)
MCV: 86.3 fL (ref 78.0–100.0)
Platelets: 278 10*3/uL (ref 150–400)
RBC: 4.32 MIL/uL (ref 3.87–5.11)
RDW: 15.1 % (ref 11.5–15.5)
WBC: 13.7 10*3/uL — ABNORMAL HIGH (ref 4.0–10.5)

## 2016-06-26 LAB — TSH: TSH: 9.566 u[IU]/mL — AB (ref 0.350–4.500)

## 2016-06-26 LAB — BRAIN NATRIURETIC PEPTIDE: B Natriuretic Peptide: 836.8 pg/mL — ABNORMAL HIGH (ref 0.0–100.0)

## 2016-06-26 LAB — GLUCOSE, CAPILLARY
GLUCOSE-CAPILLARY: 152 mg/dL — AB (ref 65–99)
Glucose-Capillary: 136 mg/dL — ABNORMAL HIGH (ref 65–99)

## 2016-06-26 LAB — T4, FREE: Free T4: 0.99 ng/dL (ref 0.61–1.12)

## 2016-06-26 LAB — TROPONIN I
TROPONIN I: 0.03 ng/mL — AB (ref <0.03)
Troponin I: 0.46 ng/mL (ref <0.03)

## 2016-06-26 LAB — HIV ANTIBODY (ROUTINE TESTING W REFLEX): HIV SCREEN 4TH GENERATION: NONREACTIVE

## 2016-06-26 LAB — LEGIONELLA PNEUMOPHILA SEROGP 1 UR AG: L. pneumophila Serogp 1 Ur Ag: NEGATIVE

## 2016-06-26 LAB — MAGNESIUM: Magnesium: 1.8 mg/dL (ref 1.7–2.4)

## 2016-06-26 MED ORDER — TRAMADOL HCL 50 MG PO TABS
50.0000 mg | ORAL_TABLET | Freq: Four times a day (QID) | ORAL | Status: DC | PRN
  Administered 2016-06-26: 100 mg via ORAL
  Filled 2016-06-26: qty 2

## 2016-06-26 MED ORDER — DILTIAZEM HCL 25 MG/5ML IV SOLN
10.0000 mg | Freq: Once | INTRAVENOUS | Status: DC
  Filled 2016-06-26: qty 5

## 2016-06-26 MED ORDER — MAGNESIUM SULFATE 4 GM/100ML IV SOLN
4.0000 g | Freq: Once | INTRAVENOUS | Status: AC
  Administered 2016-06-26: 4 g via INTRAVENOUS
  Filled 2016-06-26: qty 100

## 2016-06-26 MED ORDER — DILTIAZEM HCL 100 MG IV SOLR
5.0000 mg/h | INTRAVENOUS | Status: DC
  Administered 2016-06-26: 10 mg/h via INTRAVENOUS
  Administered 2016-06-26: 5 mg/h via INTRAVENOUS
  Filled 2016-06-26 (×3): qty 100

## 2016-06-26 MED ORDER — GUAIFENESIN 100 MG/5ML PO SOLN
5.0000 mL | ORAL | Status: DC | PRN
  Administered 2016-06-26 – 2016-06-28 (×2): 100 mg via ORAL
  Filled 2016-06-26 (×2): qty 5

## 2016-06-26 MED ORDER — DILTIAZEM LOAD VIA INFUSION
10.0000 mg | Freq: Once | INTRAVENOUS | Status: AC
Start: 2016-06-26 — End: 2016-06-26
  Administered 2016-06-26: 10 mg via INTRAVENOUS
  Filled 2016-06-26: qty 10

## 2016-06-26 MED ORDER — DILTIAZEM HCL-DEXTROSE 100-5 MG/100ML-% IV SOLN (PREMIX)
5.0000 mg/h | INTRAVENOUS | Status: DC
  Administered 2016-06-27 (×2): 10 mg/h via INTRAVENOUS
  Filled 2016-06-26 (×2): qty 100

## 2016-06-26 MED ORDER — PROCHLORPERAZINE EDISYLATE 5 MG/ML IJ SOLN
10.0000 mg | Freq: Four times a day (QID) | INTRAMUSCULAR | Status: DC | PRN
  Administered 2016-06-26: 10 mg via INTRAVENOUS
  Filled 2016-06-26 (×2): qty 2

## 2016-06-26 MED ORDER — POTASSIUM CHLORIDE CRYS ER 20 MEQ PO TBCR
40.0000 meq | EXTENDED_RELEASE_TABLET | ORAL | Status: AC
  Administered 2016-06-26 (×2): 40 meq via ORAL
  Filled 2016-06-26 (×3): qty 2

## 2016-06-26 MED ORDER — LEVOTHYROXINE SODIUM 100 MCG IV SOLR
12.5000 ug | Freq: Every day | INTRAVENOUS | Status: DC
  Administered 2016-06-26: 12.5 ug via INTRAVENOUS
  Filled 2016-06-26 (×2): qty 5

## 2016-06-26 NOTE — Progress Notes (Signed)
Grandville Silos MD, notified of troponin level of 0.46, will continue to monitor

## 2016-06-26 NOTE — Consult Note (Addendum)
Admit date: 06/21/2016 Referring Physician  Dr. Grandville Silos Primary Physician  Dr. Jill Alexanders Primary Cardiologist  Dr. Peter [name] Reason for Consultation  Chest pain and elevated trop  HPI: This is an 83 y.o.femalewith history of paroxysmal atrial fibrillation, hypertension, TIA and hiatal hernia who presented to the ER with persistent nausea, vomiting with chest pain since 3/4.  Patient was diagnosed with pneumonia a week prior and had completed a one-week course of Levaquin. An hour after having steak she started having nausea and vomiting with left upper quadrant abdominal pain with chest pain radiating to the left shoulder. Patient had multiple episodes of vomiting. 2 days ago patient had mild diarrhea.Esophagram was concerning for a gastric outlet obstruction versus small bowel obstruction. CT abdomen and pelvis showed large intrathoracic gastric hernia and felt findings most likely represented spontaneous interval reduction of a gastric volvulus. No evidence of gastric outlet obstruction or small bowel obstruction. General surgery was consulted and followed the patient during the hospitalization.  She had clinical improvement with resolution of her N/V and started on PPI.  She developed SOB with worsening leukocytosis on 3/9 with left basilar infiltrate on cxray c/w probable aspiration PNA from prior N/V.  She also got IVF hydration and was 4L+ so there was concern also for acute CHF.  She was started on IV lasix 40mg  BID.  She has responded well and put out 2.6L yesterday but still 2L+.  She was also found to be hypothyroid and receiving thyroid replacement IV.  Hospital stay has also been complicated by afib with RVR and is now controlled on IV Cardizem gtt.  She is on Eliquis.  She was noted to have an elevated trop earlier today of 0.46 and BNP 836 and Cardiology is now asked to consult.  She currently denies any chest pain and SOB has improved.     PMH:   Past Medical History:    Diagnosis Date  . Arthritis    "hands, little in my feet" (06/22/2016)  . Candidiasis of vulva and vagina   . Cystocele, midline   . Disorder of bone and cartilage, unspecified   . Fibroids   . Hypertension   . Large hiatal hernia 06/23/2016  . Migraine    "none in the last few years" (06/22/2016)  . Other chronic cystitis   . Other sign and symptom in breast   . Paroxysmal atrial fibrillation ([place]) 11/30/2012  . Pneumonia    "I've had walking pneumonia a couple times"  (06/22/2016)  . Pneumonia dx'd 06/15/2016  . Rectal incontinence   . TIA (transient ischemic attack) 2015     PSH:   Past Surgical History:  Procedure Laterality Date  . CATARACT EXTRACTION W/ INTRAOCULAR LENS IMPLANT Left   . DILATION AND CURETTAGE OF UTERUS    . HYSTEROSCOPY W/D&C  03/16/1999  . TEE WITHOUT CARDIOVERSION N/A 11/06/2012   Procedure: TRANSESOPHAGEAL ECHOCARDIOGRAM (TEE);  Surgeon: Lelon Perla, MD;  Location: [facility] ENDOSCOPY;  Service: Cardiovascular;  Laterality: N/A;  . TONSILLECTOMY  1937  . VAGINAL HYSTERECTOMY  09/2005   Anterior repair; Removal of urethral caruncle./notes 09/02/2015    Allergies:  Lisinopril Prior to Admit Meds:   Prescriptions Prior to Admission  Medication Sig Dispense Refill Last Dose  . azelastine (ASTELIN) 137 MCG/SPRAY nasal spray Place 1 spray into the nose 2 (two) times daily. Use in each nostril as directed (Patient taking differently: Place 1 spray into the nose 2 (two) times daily as needed for rhinitis or  allergies. Use in each nostril as directed) 30 mL 11 unk  . benzonatate (TESSALON) 100 MG capsule Take 2 capsules (200 mg total) by mouth 3 (three) times daily as needed for cough. 30 capsule 0 Past Month at Unknown time  . CALCIUM CARBONATE-VIT D-MIN PO Take 2 tablets by mouth daily.    06/21/2016 at Unknown time  . CRANBERRY EXTRACT PO Take 1 capsule by mouth daily.    06/21/2016 at Unknown time  . ELIQUIS 5 MG TABS tablet take 1 tablet by mouth twice a day 60 tablet 1  06/21/2016 at Unknown time  . glucosamine-chondroitin 500-400 MG tablet Take 1 tablet by mouth 3 (three) times daily.     06/21/2016 at Unknown time  . hydrochlorothiazide (MICROZIDE) 12.5 MG capsule take 1 capsule by mouth once daily 90 capsule 2 06/21/2016 at Unknown time  . loratadine (CLARITIN) 10 MG tablet Take 5 mg by mouth daily as needed for allergies.   unk  . metoprolol succinate (TOPROL-XL) 50 MG 24 hr tablet Take 1 tablet (50 mg total) by mouth daily. Take with or immediately following a meal. 90 tablet 3 06/21/2016 at 1800  . Polyethyl Glycol-Propyl Glycol (SYSTANE OP) Place 1 drop into both eyes daily as needed (for dry eyes).   unk  . pravastatin (PRAVACHOL) 40 MG tablet take 1 tablet by mouth once daily 30 tablet 11 06/21/2016 at Unknown time   Fam HX:    Family History  Problem Relation Age of Onset  . Hypertension Mother   . Heart disease Father    Social HX:    Social History   Social History  . Marital status: Married    Spouse name: N/A  . Number of children: 2  . Years of education: N/A   Occupational History  . Not on file.   Social History Main Topics  . Smoking status: Never Smoker  . Smokeless tobacco: Never Used  . Alcohol use 1.2 oz/week    2 Glasses of wine per week  . Drug use: No  . Sexual activity: Not Currently   Other Topics Concern  . Not on file   Social History Narrative  . No narrative on file     ROS:  All 11 ROS were addressed and are negative except what is stated in the HPI  Physical Exam: Blood pressure (!) 102/50, pulse 86, temperature 99.1 F (37.3 C), temperature source Oral, resp. rate 18, height 5\' 6"  (1.676 m), weight 165 lb (74.8 kg), SpO2 93 %.    General: Well developed, well nourished, in no acute distress Head: Eyes PERRLA, No xanthomas.   Normal cephalic and atramatic  Lungs:   Clear bilaterally to auscultation and percussion. Heart:   HRRR S1 S2 Pulses are 2+ & equal.            No carotid bruit. No JVD.  No abdominal  bruits. No femoral bruits. Abdomen: Bowel sounds are positive, abdomen soft and non-tender without masses  Msk:  Back normal, normal gait. Normal strength and tone for age. Extremities:   No clubbing, cyanosis or edema.  DP +1 Neuro: Alert and oriented X 3. Psych:  Good affect, responds appropriately    Labs:   Lab Results  Component Value Date   WBC 13.7 (H) 06/26/2016   HGB 12.2 06/26/2016   HCT 37.3 06/26/2016   MCV 86.3 06/26/2016   PLT 278 06/26/2016    Recent Labs Lab 06/22/16 0753  06/26/16 0622  NA  --   < >  139  K  --   < > 3.2*  CL  --   < > 102  CO2  --   < > 25  BUN  --   < > 9  CREATININE  --   < > 0.91  CALCIUM  --   < > 8.3*  PROT 7.3  --   --   BILITOT 0.6  --   --   ALKPHOS 44  --   --   ALT 17  --   --   AST 31  --   --   GLUCOSE  --   < > 157*  < > = values in this interval not displayed. No results found for: PTT Lab Results  Component Value Date   INR 1.31 06/22/2016   INR 0.96 08/26/2012   Lab Results  Component Value Date   TROPONINI 0.03 (HH) 06/26/2016     Lab Results  Component Value Date   CHOL 140 01/15/2016   CHOL 153 12/09/2014   CHOL 177 10/02/2013   Lab Results  Component Value Date   HDL 43 (L) 01/15/2016   HDL 48 12/09/2014   HDL 44 10/02/2013   Lab Results  Component Value Date   LDLCALC 63 01/15/2016   LDLCALC 79 12/09/2014   LDLCALC 90 10/02/2013   Lab Results  Component Value Date   TRIG 171 (H) 01/15/2016   TRIG 132 12/09/2014   TRIG 217 (H) 10/02/2013   Lab Results  Component Value Date   CHOLHDL 3.3 01/15/2016   CHOLHDL 3.2 12/09/2014   CHOLHDL 4.0 10/02/2013   No results found for: LDLDIRECT    Radiology:  Dg Chest 2 View  Result Date: 06/25/2016 CLINICAL DATA:  Shortness of breath and weakness EXAM: CHEST  2 VIEW COMPARISON:  06/21/2016 FINDINGS: Cardiac shadow is enlarged. A large hiatal hernia is again seen and stable. Increasing left basilar infiltrate and effusion is seen compared with the  prior exam. No acute bony abnormality is noted. IMPRESSION: Increasing left basilar infiltrate. Electronically Signed   By: Inez Catalina M.D.   On: 06/25/2016 10:53     Telemetry    Atrial fibrillation with CVR - Personally Reviewed  ECG    Atrial fibrillation with nonspecific ST abnormality and septal infarct - Personally Reviewed   ASSESSMENT/PLAN:   1.  Acute diastolic CHF - LVF as normal on last echo 2014.  She is volume overloaded likely due to significant fluids she has received during hospitalization in setting of diastolic dysfunction.  BNP elevated at 836.  She has diuresed well in the last 24 hours 2L but is still net positive 2L.  Will continue with IV diuretics for now and replete potassium as needed.  Repeat 2D echo in am to make sure that LVF is still preserved. Her TSH is elevated and she is being repleted.    2.  Elevated troponin - initial trop elevated at 0.46 and now 0.03.  This is likely related to acute CHF and PNA and is flat trend.  She has no history of CAD in the past.  Echo pending. Further workup pending results of echo.    3.  Paroxysmal atrial fibrillation now in atrial fibrillation with CVR likely triggered by acute illness.  She is back on Eliquis.  Continue IV Cardizem gtt and if rate stable then change to PO in am as long as LVF is normal.  4.  Hypokalemia - replete to keep > 4. Mag ok.  5.  Large hiatal hernia with probable spontaneous reduced gastric vovulus - per IM  Fransico Him, MD  06/26/2016  7:26 PM

## 2016-06-26 NOTE — Progress Notes (Signed)
PROGRESS NOTE    [name]  FGH:[id number] DOB: Dec 12, 1930 DOA: 06/21/2016 PCP: Wyatt Haste, MD    Brief Narrative:  [name] is a 83 y.o. female with history of paroxysmal atrial fibrillation, hypertension, TIA presents to the ER because of persistent nausea vomiting with chest pain since last evening 6 PM. Patient states she was diagnosed with pneumonia last week and had completed a one-week course of Levaquin, last dose was yesterday. Last evening after having steak half an hour later patient started having nausea vomiting with left upper quadrant abdominal pain with chest pain radiating to the left shoulder. Patient had multiple episodes of vomiting. 2 days ago patient had mild diarrhea.   ED Course: In the ER EKG shows normal sinus rhythm with chest x-ray and KUB being unremarkable. Sonogram of the abdomen does not show any gallstones. Patient still persistently has nausea vomiting. Will be admitted for further observation. Patient was chest pain-free on admission.  Patient was admitted esophagram which was done was concerning for a gastric outlet obstruction versus small bowel obstruction. CT abdomen and pelvis was also done which did show large intrathoracic gastric hernia, no wall thickening or pneumatosis in the visualized stomach, findings most likely representing spontaneous interval reduction of a gastric volvulus. No evidence of gastric outlet obstruction or small bowel obstruction. General surgery was consulted and followed the patient during the hospitalization.    Assessment & Plan:   Principal Problem:   Acute gastric volvulus Active Problems:   Paroxysmal atrial fibrillation (HCC)   Large hiatal hernia   Nausea & vomiting   Essential hypertension, benign   Chest pain   Nausea and vomiting   PNA (pneumonia)   Hypervolemia   SOB (shortness of breath)   Acute on chronic congestive heart failure (HCC)  #1 large hiatal show hernia probably would  spontaneously reduced acute gastric volvulus As noted per esophagram and CT abdomen and pelvis. Patient with clinical improvement with no further nausea or vomiting. Patient tolerating soft diet. Continue PPI. Patient will likely need elective outpatient repair with further workup with EGD, possible manometry, cardiac clearance and eventual upper endoscopy hernia repair/possible fundoplication per general surgery recommendations. General surgery following and appreciate input and recommendations.  #2 probable aspiration pneumonia/volume overload Patient noted to be short of breath overnight. Patient noted to have a worsening leukocytosis with a white count of 15.3 on 06/25/2016 which is trending back down and currently at 13.7. Patient had presented with nausea and vomiting secondary to large facial hernia and spontaneously reduced acute gastric volvulus and as such may have aspirated at that time.  Patient currently afebrile. Chest x-ray with worsening left basilar infiltrate. Patient also was nothing by mouth during initial part of the hospitalization and hydrated with IV fluids. Patient is approximately positive 4L on this admission. Urine Legionella antigen negative. Urine pneumococcus antigen negative. Sputum Gram stain and culture pending. Blood cultures pending with no growth to date. MRSA screening nasal mucosa negative. Continue empiric IV Zosyn. Continue Lasix 40 mg IV every 12 hours.  #3 acute CHF exacerbation Likely multifactorial secondary to A. fib with RVR and probable volume overload secondary to fluid resuscitation on admission. Patient with diffuse crackles on a sample with complaints of shortness of breath on minimal exertion. Patient was +4 L during this hospitalization. Patient with a urine output of 2.65 L over the past 24 hours and -2.14 L over the past 24 hours. Cycle cardiac enzymes every 6 hours 3. Check a TSH. Continue  Lasix 40 mg IV every 12 hours. Strict I's and O's. Daily  weights.   #4 hypokalemia Secondary to diuretics. Repleted.  #5 paroxysmal atrial fibrillation with RVR Likely secondary to spontaneously reduce gastric volvulus and pneumonia. Patient transitioned from IV Lopressor to oral home dose Toprol for rate control. Patient noted to have elevated heart rates which is sustaining with heart rates in the 100s to the 130s. Cycle cardiac enzymes every 6 hours 3. Check a TSH. Check a 2-D echo. Discontinue Toprol. Place on a Cardizem drip.  Patient has been transitioned from IV heparin back to home regimen of eliquis for anticoagulation.   #6 dehydration Resolved. Patient now volume overloaded. Saline lock IVF.   #7 chest pain Secondary to problem #1. Resolved.  #8 hypertension Blood pressure currently stable. Patient has been transitioned to home dose oral Toprol. HCTZ on hold. Discontinued IV Lopressor. D/C toprol as patient now on cardizem drip.  #9 history of TIA Stable. Continue anticoagulation for secondary stroke prevention.   DVT prophylaxis: Eliquis Code Status: Full Family Communication: Updated patient and family at bedside. Disposition Plan: Transfer to cardiac telemetry where cardiac drip can be titrated. Home when tolerating oral intake, continued bowel movements and per general surgery, improvement with shortness of breath and leukocytosis and aif and CHF. Hopefully in the next 1-2 days.   Consultants:   Gen. surgery: Dr.Tsuei 06/22/2016  Procedures:   Esophagram 06/22/2016  CT abdomen and pelvis 06/22/2016  Chest x-ray 06/22/2016, 06/25/2016  Abdominal x-ray 06/22/2016  Antimicrobials:   IV Zosyn 06/25/2016   Subjective: Patient complaining of headache and shortness of breath. Patient also noted to have palpitations. Patient in A. fib ranging from low 100s to the 130s. Patient currently tolerating a soft diet. Patient passing flatus.   Objective: Vitals:   06/26/16 0600 06/26/16 1405 06/26/16 1453 06/26/16 1529    BP: (!) 114/91 125/72 105/61 (!) 127/54  Pulse: (!) 118  (!) 110 100  Resp:  20 18   Temp: 99 F (37.2 C)  99.1 F (37.3 C)   TempSrc: Oral  Oral   SpO2: 94%  93%   Weight: 74.8 kg (165 lb)     Height:        Intake/Output Summary (Last 24 hours) at 06/26/16 1540 Last data filed at 06/26/16 0620  Gross per 24 hour  Intake              510 ml  Output             1750 ml  Net            -1240 ml   Filed Weights   06/24/16 0228 06/25/16 0542 06/26/16 0600  Weight: 77.7 kg (171 lb 3.2 oz) 77.9 kg (171 lb 12.8 oz) 74.8 kg (165 lb)    Examination:  General exam: Appears calm and comfortable  Respiratory system: Diffuse crackles. Some coarse breath sounds in the left base. Respiratory effort normal. Cardiovascular system: Irregularly irregular. No JVD, murmurs, rubs, gallops or clicks. 1 + BLE edema Gastrointestinal system: Abdomen is nondistended, soft and nontender. No organomegaly or masses felt. Normal bowel sounds heard. Central nervous system: Alert and oriented. No focal neurological deficits. Extremities: Symmetric 5 x 5 power. Skin: No rashes, lesions or ulcers Psychiatry: Judgement and insight appear normal. Mood & affect appropriate.     Data Reviewed: I have personally reviewed following labs and imaging studies  CBC:  Recent Labs Lab 06/21/16 2328 06/23/16 0010 06/24/16 9563 06/25/16 0849 06/26/16  0622  WBC 13.1* 13.5* 11.5* 15.3* 13.7*  NEUTROABS  --  7.0  --   --   --   HGB 14.8 12.5 11.4* 11.6* 12.2  HCT 44.3 37.9 35.4* 36.4 37.3  MCV 86.4 87.7 87.0 87.5 86.3  PLT 299 279 244 287 169   Basic Metabolic Panel:  Recent Labs Lab 06/21/16 2328 06/22/16 1513 06/23/16 1034 06/24/16 0733 06/25/16 0849 06/26/16 0622  NA 139  --  143 141 141 139  K 3.2*  --  3.4* 3.5 4.2 3.2*  CL 98*  --  111 112* 110 102  CO2 27  --  26 23 24 25   GLUCOSE 182*  --  107* 133* 138* 157*  BUN 17  --  21* 11 10 9   CREATININE 0.98  --  0.80 0.77 0.81 0.91  CALCIUM  9.3  --  8.3* 7.9* 8.5* 8.3*  MG  --  1.8 2.0  --   --  1.8   GFR: Estimated Creatinine Clearance: 47.6 mL/min (by C-G formula based on SCr of 0.91 mg/dL). Liver Function Tests:  Recent Labs Lab 06/21/16 2328 06/22/16 0753  AST 22 31  ALT 18 17  ALKPHOS 46 44  BILITOT 0.6 0.6  PROT 7.0 7.3  ALBUMIN 3.8 3.7    Recent Labs Lab 06/21/16 2328 06/22/16 0753  LIPASE 18 22   No results for input(s): AMMONIA in the last 168 hours. Coagulation Profile:  Recent Labs Lab 06/22/16 0753  INR 1.31   Cardiac Enzymes:  Recent Labs Lab 06/22/16 0753 06/22/16 1513 06/22/16 1816  TROPONINI <0.03 <0.03 <0.03   BNP (last 3 results) No results for input(s): PROBNP in the last 8760 hours. HbA1C: No results for input(s): HGBA1C in the last 72 hours. CBG:  Recent Labs Lab 06/24/16 2320 06/25/16 0849 06/25/16 1639 06/25/16 2207 06/26/16 0808  GLUCAP 129* 131* 141* 170* 136*   Lipid Profile: No results for input(s): CHOL, HDL, LDLCALC, TRIG, CHOLHDL, LDLDIRECT in the last 72 hours. Thyroid Function Tests: No results for input(s): TSH, T4TOTAL, FREET4, T3FREE, THYROIDAB in the last 72 hours. Anemia Panel: No results for input(s): VITAMINB12, FOLATE, FERRITIN, TIBC, IRON, RETICCTPCT in the last 72 hours. Sepsis Labs: No results for input(s): PROCALCITON, LATICACIDVEN in the last 168 hours.  Recent Results (from the past 240 hour(s))  Culture, Urine     Status: Abnormal   Collection Time: 06/22/16  8:23 AM  Result Value Ref Range Status   Specimen Description URINE, CLEAN CATCH  Final   Special Requests NONE  Final   Culture MULTIPLE SPECIES PRESENT, SUGGEST RECOLLECTION (A)  Final   Report Status 06/23/2016 FINAL  Final  MRSA PCR Screening     Status: None   Collection Time: 06/25/16 11:11 AM  Result Value Ref Range Status   MRSA by PCR NEGATIVE NEGATIVE Final    Comment:        The GeneXpert MRSA Assay (FDA approved for NASAL specimens only), is one component of  a comprehensive MRSA colonization surveillance program. It is not intended to diagnose MRSA infection nor to guide or monitor treatment for MRSA infections.   Culture, blood (routine x 2) Call MD if unable to obtain prior to antibiotics being given     Status: None (Preliminary result)   Collection Time: 06/25/16  2:54 PM  Result Value Ref Range Status   Specimen Description BLOOD LEFT ANTECUBITAL  Final   Special Requests BOTTLES DRAWN AEROBIC AND ANAEROBIC 10CC  Final   Culture  NO GROWTH < 24 HOURS  Final   Report Status PENDING  Incomplete  Culture, blood (routine x 2) Call MD if unable to obtain prior to antibiotics being given     Status: None (Preliminary result)   Collection Time: 06/25/16  2:54 PM  Result Value Ref Range Status   Specimen Description BLOOD LEFT HAND  Final   Special Requests BOTTLES DRAWN AEROBIC AND ANAEROBIC 4CC  Final   Culture NO GROWTH < 24 HOURS  Final   Report Status PENDING  Incomplete         Radiology Studies: Dg Chest 2 View  Result Date: 06/25/2016 CLINICAL DATA:  Shortness of breath and weakness EXAM: CHEST  2 VIEW COMPARISON:  06/21/2016 FINDINGS: Cardiac shadow is enlarged. A large hiatal hernia is again seen and stable. Increasing left basilar infiltrate and effusion is seen compared with the prior exam. No acute bony abnormality is noted. IMPRESSION: Increasing left basilar infiltrate. Electronically Signed   By: Inez Catalina M.D.   On: 06/25/2016 10:53        Scheduled Meds: . apixaban  5 mg Oral BID  . furosemide  40 mg Intravenous Q12H  . pantoprazole  40 mg Oral Q0600  . piperacillin-tazobactam (ZOSYN)  IV  3.375 g Intravenous Q8H  . pravastatin  40 mg Oral Daily   Continuous Infusions: . diltiazem (CARDIZEM) infusion 5 mg/hr (06/26/16 1531)     LOS: 4 days    Time spent: 33 minutes    Tarron Krolak, MD [facility] Pager [phone]  If 7PM-7AM, please contact night-coverage [email or web address] Password  [email or web address] 06/26/2016, 3:40 PM

## 2016-06-26 NOTE — Progress Notes (Signed)
Patient arrived on hospital bed from 5W, assessment completed see flowsheet, patient oriented to room and staff, placed on tele ccmd notified, bed in lowest position, call bell within reach, patient resting quietly in bed  will continue to monitor.

## 2016-06-27 DIAGNOSIS — I509 Heart failure, unspecified: Secondary | ICD-10-CM

## 2016-06-27 DIAGNOSIS — R946 Abnormal results of thyroid function studies: Secondary | ICD-10-CM

## 2016-06-27 DIAGNOSIS — K3189 Other diseases of stomach and duodenum: Principal | ICD-10-CM

## 2016-06-27 LAB — BASIC METABOLIC PANEL
Anion gap: 10 (ref 5–15)
BUN: 13 mg/dL (ref 6–20)
CALCIUM: 8 mg/dL — AB (ref 8.9–10.3)
CHLORIDE: 102 mmol/L (ref 101–111)
CO2: 25 mmol/L (ref 22–32)
Creatinine, Ser: 1.12 mg/dL — ABNORMAL HIGH (ref 0.44–1.00)
GFR calc non Af Amer: 44 mL/min — ABNORMAL LOW (ref >60)
GFR, EST AFRICAN AMERICAN: 51 mL/min — AB (ref >60)
GLUCOSE: 189 mg/dL — AB (ref 65–99)
Potassium: 3.5 mmol/L (ref 3.5–5.1)
Sodium: 137 mmol/L (ref 135–145)

## 2016-06-27 LAB — CBC
HEMATOCRIT: 34.5 % — AB (ref 36.0–46.0)
HEMOGLOBIN: 11.1 g/dL — AB (ref 12.0–15.0)
MCH: 28 pg (ref 26.0–34.0)
MCHC: 32.2 g/dL (ref 30.0–36.0)
MCV: 86.9 fL (ref 78.0–100.0)
PLATELETS: 296 10*3/uL (ref 150–400)
RBC: 3.97 MIL/uL (ref 3.87–5.11)
RDW: 15.4 % (ref 11.5–15.5)
WBC: 13.5 10*3/uL — AB (ref 4.0–10.5)

## 2016-06-27 LAB — GLUCOSE, CAPILLARY
GLUCOSE-CAPILLARY: 154 mg/dL — AB (ref 65–99)
GLUCOSE-CAPILLARY: 149 mg/dL — AB (ref 65–99)
GLUCOSE-CAPILLARY: 169 mg/dL — AB (ref 65–99)
Glucose-Capillary: 139 mg/dL — ABNORMAL HIGH (ref 65–99)

## 2016-06-27 LAB — LIPID PANEL
CHOL/HDL RATIO: 2.2 ratio
CHOLESTEROL: 89 mg/dL (ref 0–200)
HDL: 41 mg/dL (ref >40)
LDL CALC: 37 mg/dL (ref 0–99)
TRIGLYCERIDES: 55 mg/dL (ref <150)
VLDL: 11 mg/dL (ref 0–40)

## 2016-06-27 LAB — TROPONIN I: Troponin I: <0.03 ng/mL (ref <0.03)

## 2016-06-27 LAB — BRAIN NATRIURETIC PEPTIDE: B Natriuretic Peptide: 492.1 pg/mL — ABNORMAL HIGH (ref 0.0–100.0)

## 2016-06-27 LAB — T3, FREE: T3, Free: 3.2 pg/mL (ref 2.0–4.4)

## 2016-06-27 LAB — MAGNESIUM: Magnesium: 2.5 mg/dL — ABNORMAL HIGH (ref 1.7–2.4)

## 2016-06-27 NOTE — Progress Notes (Signed)
PROGRESS NOTE    [name]  XIP:[id number] DOB: 11/18/1930 DOA: 06/21/2016 PCP: Wyatt Haste, MD    Brief Narrative:  TRECIA Spencer is a 83 y.o. female with history of paroxysmal atrial fibrillation, hypertension, TIA presents to the ER because of persistent nausea vomiting with chest pain since last evening 6 PM. Patient states she was diagnosed with pneumonia last week and had completed a one-week course of Levaquin, last dose was yesterday. Last evening after having steak half an hour later patient started having nausea vomiting with left upper quadrant abdominal pain with chest pain radiating to the left shoulder. Patient had multiple episodes of vomiting. 2 days ago patient had mild diarrhea.   ED Course: In the ER EKG shows normal sinus rhythm with chest x-ray and KUB being unremarkable. Sonogram of the abdomen does not show any gallstones. Patient still persistently has nausea vomiting. Will be admitted for further observation. Patient was chest pain-free on admission.  Patient was admitted esophagram which was done was concerning for a gastric outlet obstruction versus small bowel obstruction. CT abdomen and pelvis was also done which did show large intrathoracic gastric hernia, no wall thickening or pneumatosis in the visualized stomach, findings most likely representing spontaneous interval reduction of a gastric volvulus. No evidence of gastric outlet obstruction or small bowel obstruction. General surgery was consulted and followed the patient during the hospitalization.  During the hospitalization patient subsequently went into A. fib with RVR as well as acute CHF and also noted to have a left basilar pneumonia. Patient transferred to cardiac telemetry floor placed on a Cardizem drip as well as IV Lasix and empiric IV Zosyn. Cardiac workup initiated. Cardiology consulted.    Assessment & Plan:   Principal Problem:   Acute gastric volvulus Active Problems:   Paroxysmal  atrial fibrillation (HCC)   Large hiatal hernia   Nausea & vomiting   Essential hypertension, benign   Chest pain   Nausea and vomiting   PNA (pneumonia)   Hypervolemia   SOB (shortness of breath)   Acute on chronic congestive heart failure (HCC)   Abnormal TSH   Elevated troponin  #1 large hiatal show hernia probably would spontaneously reduced acute gastric volvulus As noted per esophagram and CT abdomen and pelvis. Patient with clinical improvement with no further nausea or vomiting. Patient tolerating soft diet. Continue PPI. Patient will likely need elective outpatient repair with further workup with EGD, possible manometry, cardiac clearance and eventual upper endoscopy hernia repair/possible fundoplication per general surgery recommendations. General surgery following and appreciate input and recommendations.  #2 probable aspiration pneumonia/volume overload Patient noted to be short of breath over the past 2-3 days. Patient noted to have a worsening leukocytosis with a white count of 15.3 on 06/25/2016 which is trending back down and currently at 13.5. Patient had presented with nausea and vomiting secondary to large facial hernia and spontaneously reduced acute gastric volvulus and as such may have aspirated at that time.  Patient currently afebrile. Chest x-ray with worsening left basilar infiltrate. Patient also was nothing by mouth during initial part of the hospitalization and hydrated with IV fluids. Patient was approximately positive 4L on this admission. Urine Legionella antigen negative. Urine pneumococcus antigen negative. Sputum Gram stain and culture pending. Blood cultures pending with no growth to date. MRSA screening nasal mucosa negative. Continue empiric IV Zosyn. Continue Lasix 40 mg IV every 12 hours.  #3 acute CHF exacerbation Likely multifactorial secondary to A. fib with RVR and probable  volume overload secondary to fluid resuscitation on admission. Patient with  bibasilar crackles now which have improved from 06/26/2016. Patient states shortness of breath improving. Patient was +4 L during this hospitalization. Patient with a urine output of 700 mL over the past 24 hours noted however per husband patient urinated in there clothes which could not be recorded. Patient's current weight is 81.1 kg from 74.8 kg from 77.9 kg. Weights likely unreliable as may have been 2 different scales from different floors. Initial set of cardiac enzymes was elevated however serial troponins have trended down. TSH elevated however free T4 and T3 within normal limits and likely euthyroid sick syndrome. Continue current dose of Lasix 40 mg IV every 12 hours. Strict I's and O's. Daily weights. 2-D echo pending. Cardiology following.    #4 hypokalemia Secondary to diuretics. Repleted. Morning labs pending.  #5 paroxysmal atrial fibrillation with RVR Likely secondary to spontaneously reduce gastric volvulus and pneumonia. Patient transitioned from IV Lopressor to oral home dose Toprol for rate control. Patient noted to have elevated heart rates which was sustaining with heart rates in the 100s to the 130s. Patient currently on Cardizem drip. Initial set of cardiac enzymes was elevated however serial enzymes have trended down. TSH was elevated at 9.566, free T4 at 0.99, free T3 at 3.2. Likely euthyroid sick syndrome. Discontinue Synthroid. 2-D echo pending.  Patient has been transitioned from IV heparin back to home regimen of eliquis for anticoagulation. Cardiology following and appreciate input and recommendations.  #6 abnormal TSH/euthyroid sick syndrome TSH noted to be elevated at 9.566. Free T3 at 3.2. Free T4 0.99. Likely euthyroid sick syndrome. Discontinue IV Synthroid. Will likely need repeat thyroid function studies done in about 4-6 weeks.  #7 dehydration Resolved. Patient now volume overloaded. Saline lock IVF.   #8 chest pain Secondary to problem #1. Cardiac enzymes  initially elevated however have trended down. 2-D echo pending. Resolved.  #9 hypertension Blood pressure currently stable. Currently on Cardizem drip. Patient's HCTZ and Toprol have been discontinued.   #10 history of TIA Stable. Continue anticoagulation for secondary stroke prevention.   DVT prophylaxis: Eliquis Code Status: Full Family Communication: Updated patient and family at bedside. Disposition Plan: Remain in cardiac telemetry today. Home when tolerating oral intake, continued bowel movements and per general surgery, improvement with shortness of breath and leukocytosis and aif and CHF. Hopefully in the next 1-2 days.   Consultants:   Gen. surgery: Dr.Tsuei 06/22/2016  Cardiology: Dr. Fransico Him 06/26/2016  Procedures:   Esophagram 06/22/2016  CT abdomen and pelvis 06/22/2016  Chest x-ray 06/22/2016, 06/25/2016  Abdominal x-ray 06/22/2016  Antimicrobials:   IV Zosyn 06/25/2016   Subjective: Patient states shortness of breath improving however not at baseline. Palpitations improving. Patient tolerating soft diet over the husband has not been eating as well. Patient passing flatus. Patient having bowel movements. Overnight heart rates have ranged from low 90s to 130s on telemetry. Per husband patient has been peeing out in her underwear and unable to make it to the bedside commode in time overnight. 550 mL urine noted in bedside commode.  Objective: Vitals:   06/26/16 1619 06/26/16 2009 06/27/16 0437 06/27/16 0500  BP: (!) 102/50 123/67 (!) 103/57   Pulse: 86 (!) 123 75   Resp:   20   Temp:  99.4 F (37.4 C) 98 F (36.7 C)   TempSrc:  Oral Oral   SpO2:  94% 93%   Weight:    81.1 kg (178 lb 12.8 oz)  Height:        Intake/Output Summary (Last 24 hours) at 06/27/16 0947 Last data filed at 06/27/16 0700  Gross per 24 hour  Intake              390 ml  Output              700 ml  Net             -310 ml   Filed Weights   06/25/16 0542 06/26/16 0600  06/27/16 0500  Weight: 77.9 kg (171 lb 12.8 oz) 74.8 kg (165 lb) 81.1 kg (178 lb 12.8 oz)    Examination:  General exam: Appears calm and comfortable  Respiratory system: Bibasilar crackles. Some coarse breath sounds in the left base. Respiratory effort normal. Cardiovascular system: Irregularly irregular. No JVD, murmurs, rubs, gallops or clicks. Trace-1 + BLE edema Gastrointestinal system: Abdomen is nondistended, soft and nontender. No organomegaly or masses felt. Normal bowel sounds heard. Central nervous system: Alert and oriented. No focal neurological deficits. Extremities: Symmetric 5 x 5 power. Skin: No rashes, lesions or ulcers Psychiatry: Judgement and insight appear normal. Mood & affect appropriate.     Data Reviewed: I have personally reviewed following labs and imaging studies  CBC:  Recent Labs Lab 06/23/16 0010 06/24/16 0733 06/25/16 0849 06/26/16 0622 06/27/16 0140  WBC 13.5* 11.5* 15.3* 13.7* 13.5*  NEUTROABS 7.0  --   --   --   --   HGB 12.5 11.4* 11.6* 12.2 11.1*  HCT 37.9 35.4* 36.4 37.3 34.5*  MCV 87.7 87.0 87.5 86.3 86.9  PLT 279 244 287 278 024   Basic Metabolic Panel:  Recent Labs Lab 06/21/16 2328 06/22/16 1513 06/23/16 1034 06/24/16 0733 06/25/16 0849 06/26/16 0622 06/27/16 0140  NA 139  --  143 141 141 139  --   K 3.2*  --  3.4* 3.5 4.2 3.2*  --   CL 98*  --  111 112* 110 102  --   CO2 27  --  26 23 24 25   --   GLUCOSE 182*  --  107* 133* 138* 157*  --   BUN 17  --  21* 11 10 9   --   CREATININE 0.98  --  0.80 0.77 0.81 0.91  --   CALCIUM 9.3  --  8.3* 7.9* 8.5* 8.3*  --   MG  --  1.8 2.0  --   --  1.8 2.5*   GFR: Estimated Creatinine Clearance: 49.4 mL/min (by C-G formula based on SCr of 0.91 mg/dL). Liver Function Tests:  Recent Labs Lab 06/21/16 2328 06/22/16 0753  AST 22 31  ALT 18 17  ALKPHOS 46 44  BILITOT 0.6 0.6  PROT 7.0 7.3  ALBUMIN 3.8 3.7    Recent Labs Lab 06/21/16 2328 06/22/16 0753  LIPASE 18 22    No results for input(s): AMMONIA in the last 168 hours. Coagulation Profile:  Recent Labs Lab 06/22/16 0753  INR 1.31   Cardiac Enzymes:  Recent Labs Lab 06/22/16 1513 06/22/16 1816 06/26/16 1511 06/26/16 1748 06/27/16 0140  TROPONINI <0.03 <0.03 0.46* 0.03* <0.03   BNP (last 3 results) No results for input(s): PROBNP in the last 8760 hours. HbA1C: No results for input(s): HGBA1C in the last 72 hours. CBG:  Recent Labs Lab 06/25/16 2207 06/26/16 0808 06/26/16 1710 06/27/16 0033 06/27/16 0946  GLUCAP 170* 136* 152* 139* 154*   Lipid Profile:  Recent Labs  06/27/16 0140  CHOL 89  HDL 41  LDLCALC 37  TRIG 55  CHOLHDL 2.2   Thyroid Function Tests:  Recent Labs  06/26/16 1511 06/26/16 1748  TSH 9.566*  --   FREET4  --  0.99  T3FREE  --  3.2   Anemia Panel: No results for input(s): VITAMINB12, FOLATE, FERRITIN, TIBC, IRON, RETICCTPCT in the last 72 hours. Sepsis Labs: No results for input(s): PROCALCITON, LATICACIDVEN in the last 168 hours.  Recent Results (from the past 240 hour(s))  Culture, Urine     Status: Abnormal   Collection Time: 06/22/16  8:23 AM  Result Value Ref Range Status   Specimen Description URINE, CLEAN CATCH  Final   Special Requests NONE  Final   Culture MULTIPLE SPECIES PRESENT, SUGGEST RECOLLECTION (A)  Final   Report Status 06/23/2016 FINAL  Final  MRSA PCR Screening     Status: None   Collection Time: 06/25/16 11:11 AM  Result Value Ref Range Status   MRSA by PCR NEGATIVE NEGATIVE Final    Comment:        The GeneXpert MRSA Assay (FDA approved for NASAL specimens only), is one component of a comprehensive MRSA colonization surveillance program. It is not intended to diagnose MRSA infection nor to guide or monitor treatment for MRSA infections.   Culture, blood (routine x 2) Call MD if unable to obtain prior to antibiotics being given     Status: None (Preliminary result)   Collection Time: 06/25/16  2:54 PM   Result Value Ref Range Status   Specimen Description BLOOD LEFT ANTECUBITAL  Final   Special Requests BOTTLES DRAWN AEROBIC AND ANAEROBIC 10CC  Final   Culture NO GROWTH < 24 HOURS  Final   Report Status PENDING  Incomplete  Culture, blood (routine x 2) Call MD if unable to obtain prior to antibiotics being given     Status: None (Preliminary result)   Collection Time: 06/25/16  2:54 PM  Result Value Ref Range Status   Specimen Description BLOOD LEFT HAND  Final   Special Requests BOTTLES DRAWN AEROBIC AND ANAEROBIC 4CC  Final   Culture NO GROWTH < 24 HOURS  Final   Report Status PENDING  Incomplete         Radiology Studies: Dg Chest 2 View  Result Date: 06/25/2016 CLINICAL DATA:  Shortness of breath and weakness EXAM: CHEST  2 VIEW COMPARISON:  06/21/2016 FINDINGS: Cardiac shadow is enlarged. A large hiatal hernia is again seen and stable. Increasing left basilar infiltrate and effusion is seen compared with the prior exam. No acute bony abnormality is noted. IMPRESSION: Increasing left basilar infiltrate. Electronically Signed   By: Inez Catalina M.D.   On: 06/25/2016 10:53   Dg Chest Port 1 View  Result Date: 06/26/2016 CLINICAL DATA:  Shortness of breath. Nausea, vomiting and chest pain. EXAM: PORTABLE CHEST 1 VIEW COMPARISON:  06/25/2016 and 06/21/2016 radiographs FINDINGS: Cardiomegaly, large hiatal hernia and pulmonary vascular congestion again noted. Left lower lung atelectasis/consolidation appear slightly increased. There may be a left pleural effusion present. There is no evidence of pneumothorax. Mild right basilar atelectasis again identified. IMPRESSION: Slightly increased left lower lung consolidation/atelectasis with possible left pleural effusion. Cardiomegaly and large hiatal hernia again noted. Electronically Signed   By: Margarette Canada M.D.   On: 06/26/2016 20:09        Scheduled Meds: . apixaban  5 mg Oral BID  . furosemide  40 mg Intravenous Q12H  .  levothyroxine  12.5 mcg Intravenous Daily  .  pantoprazole  40 mg Oral Q0600  . piperacillin-tazobactam (ZOSYN)  IV  3.375 g Intravenous Q8H  . pravastatin  40 mg Oral Daily   Continuous Infusions: . dilTIAZem HCl-Dextrose       LOS: 5 days    Time spent: 40 minutes    THOMPSON,DANIEL, MD [facility] Pager [phone]  If 7PM-7AM, please contact night-coverage [email or web address] Password [email or web address] 06/27/2016, 9:47 AM

## 2016-06-27 NOTE — Progress Notes (Signed)
DAILY PROGRESS NOTE  Subjective:  Feels better today - diuresed last night, however, not recorded. A-fib remains fast on IV diltiazem. Troponin elevated, but may be related to demand ischemia or perhaps new systolic CHF. 2D echo pending.  Objective:  Temp:  [98 F (36.7 C)-99.4 F (37.4 C)] 98.2 F (36.8 C) (03/11 0951) Pulse Rate:  [75-123] 89 (03/11 0951) Resp:  [18-20] 20 (03/11 0951) BP: (101-127)/(50-72) 101/67 (03/11 0951) SpO2:  [93 %-94 %] 93 % (03/11 0951) Weight:  [178 lb 12.8 oz (81.1 kg)] 178 lb 12.8 oz (81.1 kg) (03/11 0500) Weight change: 13 lb 12.8 oz (6.26 kg)  Intake/Output from previous day: 03/10 0701 - 03/11 0700 In: 390 [P.O.:240; IV Piggyback:150] Out: 700 [Urine:700]  Intake/Output from this shift: Total I/O In: 240 [P.O.:240] Out: 650 [Urine:650]  Medications: No current facility-administered medications on file prior to encounter.    Current Outpatient Prescriptions on File Prior to Encounter  Medication Sig Dispense Refill  . azelastine (ASTELIN) 137 MCG/SPRAY nasal spray Place 1 spray into the nose 2 (two) times daily. Use in each nostril as directed (Patient taking differently: Place 1 spray into the nose 2 (two) times daily as needed for rhinitis or allergies. Use in each nostril as directed) 30 mL 11  . benzonatate (TESSALON) 100 MG capsule Take 2 capsules (200 mg total) by mouth 3 (three) times daily as needed for cough. 30 capsule 0  . CALCIUM CARBONATE-VIT D-MIN PO Take 2 tablets by mouth daily.     Marland Kitchen CRANBERRY EXTRACT PO Take 1 capsule by mouth daily.     Marland Kitchen ELIQUIS 5 MG TABS tablet take 1 tablet by mouth twice a day 60 tablet 1  . glucosamine-chondroitin 500-400 MG tablet Take 1 tablet by mouth 3 (three) times daily.      . hydrochlorothiazide (MICROZIDE) 12.5 MG capsule take 1 capsule by mouth once daily 90 capsule 2  . loratadine (CLARITIN) 10 MG tablet Take 5 mg by mouth daily as needed for allergies.    . metoprolol succinate  (TOPROL-XL) 50 MG 24 hr tablet Take 1 tablet (50 mg total) by mouth daily. Take with or immediately following a meal. 90 tablet 3  . Polyethyl Glycol-Propyl Glycol (SYSTANE OP) Place 1 drop into both eyes daily as needed (for dry eyes).    . pravastatin (PRAVACHOL) 40 MG tablet take 1 tablet by mouth once daily 30 tablet 11    Physical Exam: General appearance: alert and no distress Lungs: diminished breath sounds bibasilar Heart: irregularly irregular rhythm and tachycardia Extremities: edema trace ankle edema Neurologic: Grossly normal  Lab Results: Results for orders placed or performed during the hospital encounter of 06/21/16 (from the past 48 hour(s))  Culture, blood (routine x 2) Call MD if unable to obtain prior to antibiotics being given     Status: None (Preliminary result)   Collection Time: 06/25/16  2:54 PM  Result Value Ref Range   Specimen Description BLOOD LEFT ANTECUBITAL    Special Requests BOTTLES DRAWN AEROBIC AND ANAEROBIC 10CC    Culture NO GROWTH < 24 HOURS    Report Status PENDING   Culture, blood (routine x 2) Call MD if unable to obtain prior to antibiotics being given     Status: None (Preliminary result)   Collection Time: 06/25/16  2:54 PM  Result Value Ref Range   Specimen Description BLOOD LEFT HAND    Special Requests BOTTLES DRAWN AEROBIC AND ANAEROBIC 4CC    Culture NO GROWTH <  24 HOURS    Report Status PENDING   HIV antibody     Status: None   Collection Time: 06/25/16  2:54 PM  Result Value Ref Range   HIV Screen 4th Generation wRfx Non Reactive Non Reactive    Comment: (NOTE) Performed At: [facility] [place], [place] [id number] Lindon Romp MD QI:[id number]   Glucose, capillary     Status: Abnormal   Collection Time: 06/25/16  4:39 PM  Result Value Ref Range   Glucose-Capillary 141 (H) 65 - 99 mg/dL   Comment 1 Notify RN   Glucose, capillary     Status: Abnormal   Collection Time: 06/25/16 10:07 PM  Result  Value Ref Range   Glucose-Capillary 170 (H) 65 - 99 mg/dL  CBC     Status: Abnormal   Collection Time: 06/26/16  6:22 AM  Result Value Ref Range   WBC 13.7 (H) 4.0 - 10.5 K/uL   RBC 4.32 3.87 - 5.11 MIL/uL   Hemoglobin 12.2 12.0 - 15.0 g/dL   HCT 37.3 36.0 - 46.0 %   MCV 86.3 78.0 - 100.0 fL   MCH 28.2 26.0 - 34.0 pg   MCHC 32.7 30.0 - 36.0 g/dL   RDW 15.1 11.5 - 15.5 %   Platelets 278 150 - 400 K/uL  Basic metabolic panel     Status: Abnormal   Collection Time: 06/26/16  6:22 AM  Result Value Ref Range   Sodium 139 135 - 145 mmol/L   Potassium 3.2 (L) 3.5 - 5.1 mmol/L   Chloride 102 101 - 111 mmol/L   CO2 25 22 - 32 mmol/L   Glucose, Bld 157 (H) 65 - 99 mg/dL   BUN 9 6 - 20 mg/dL   Creatinine, Ser 0.91 0.44 - 1.00 mg/dL   Calcium 8.3 (L) 8.9 - 10.3 mg/dL   GFR calc non Af Amer 56 (L) >60 mL/min   GFR calc Af Amer >60 >60 mL/min    Comment: (NOTE) The eGFR has been calculated using the CKD EPI equation. This calculation has not been validated in all clinical situations. eGFR's persistently <60 mL/min signify possible Chronic Kidney Disease.    Anion gap 12 5 - 15  Magnesium     Status: None   Collection Time: 06/26/16  6:22 AM  Result Value Ref Range   Magnesium 1.8 1.7 - 2.4 mg/dL  Glucose, capillary     Status: Abnormal   Collection Time: 06/26/16  8:08 AM  Result Value Ref Range   Glucose-Capillary 136 (H) 65 - 99 mg/dL  Troponin I (q 6hr x 3)     Status: Abnormal   Collection Time: 06/26/16  3:11 PM  Result Value Ref Range   Troponin I 0.46 (HH) <0.03 ng/mL    Comment: CRITICAL RESULT CALLED TO, READ BACK BY AND VERIFIED WITH: F.PASEDA,RN 06/26/16 _0  BY V.WILKINS   TSH     Status: Abnormal   Collection Time: 06/26/16  3:11 PM  Result Value Ref Range   TSH 9.566 (H) 0.350 - 4.500 uIU/mL    Comment: Performed by a 3rd Generation assay with a functional sensitivity of <=0.01 uIU/mL.  Glucose, capillary     Status: Abnormal   Collection Time: 06/26/16  5:10  PM  Result Value Ref Range   Glucose-Capillary 152 (H) 65 - 99 mg/dL   Comment 1 Notify RN    Comment 2 Document in Chart   Troponin I (q 6hr x 3)  Status: Abnormal   Collection Time: 06/26/16  5:48 PM  Result Value Ref Range   Troponin I 0.03 (HH) <0.03 ng/mL    Comment: CRITICAL VALUE NOTED.  VALUE IS CONSISTENT WITH PREVIOUSLY REPORTED AND CALLED VALUE.  T4, free     Status: None   Collection Time: 06/26/16  5:48 PM  Result Value Ref Range   Free T4 0.99 0.61 - 1.12 ng/dL    Comment: (NOTE) Biotin ingestion may interfere with free T4 tests. If the results are inconsistent with the TSH level, previous test results, or the clinical presentation, then consider biotin interference. If needed, order repeat testing after stopping biotin.   T3, free     Status: None   Collection Time: 06/26/16  5:48 PM  Result Value Ref Range   T3, Free 3.2 2.0 - 4.4 pg/mL    Comment: (NOTE) Performed At: [facility] [place], [place] [id number] Lindon Romp MD WU:[id number]   Brain natriuretic peptide     Status: Abnormal   Collection Time: 06/26/16  5:48 PM  Result Value Ref Range   B Natriuretic Peptide 836.8 (H) 0.0 - 100.0 pg/mL  Glucose, capillary     Status: Abnormal   Collection Time: 06/27/16 12:33 AM  Result Value Ref Range   Glucose-Capillary 139 (H) 65 - 99 mg/dL  Troponin I (q 6hr x 3)     Status: None   Collection Time: 06/27/16  1:40 AM  Result Value Ref Range   Troponin I <0.03 <0.03 ng/mL  CBC     Status: Abnormal   Collection Time: 06/27/16  1:40 AM  Result Value Ref Range   WBC 13.5 (H) 4.0 - 10.5 K/uL   RBC 3.97 3.87 - 5.11 MIL/uL   Hemoglobin 11.1 (L) 12.0 - 15.0 g/dL   HCT 34.5 (L) 36.0 - 46.0 %   MCV 86.9 78.0 - 100.0 fL   MCH 28.0 26.0 - 34.0 pg   MCHC 32.2 30.0 - 36.0 g/dL   RDW 15.4 11.5 - 15.5 %   Platelets 296 150 - 400 K/uL  Magnesium     Status: Abnormal   Collection Time: 06/27/16  1:40 AM  Result Value Ref Range    Magnesium 2.5 (H) 1.7 - 2.4 mg/dL  Lipid panel     Status: None   Collection Time: 06/27/16  1:40 AM  Result Value Ref Range   Cholesterol 89 0 - 200 mg/dL   Triglycerides 55 <150 mg/dL   HDL 41 >40 mg/dL   Total CHOL/HDL Ratio 2.2 RATIO   VLDL 11 0 - 40 mg/dL   LDL Cholesterol 37 0 - 99 mg/dL    Comment:        Total Cholesterol/HDL:CHD Risk Coronary Heart Disease Risk Table                     Men   Women  1/2 Average Risk   3.4   3.3  Average Risk       5.0   4.4  2 X Average Risk   9.6   7.1  3 X Average Risk  23.4   11.0        Use the calculated Patient Ratio above and the CHD Risk Table to determine the patient's CHD Risk.        ATP III CLASSIFICATION (LDL):  <100     mg/dL   Optimal  100-129  mg/dL   Near or Above  Optimal  130-159  mg/dL   Borderline  160-189  mg/dL   High  >190     mg/dL   Very High   Basic metabolic panel     Status: Abnormal   Collection Time: 06/27/16  8:39 AM  Result Value Ref Range   Sodium 137 135 - 145 mmol/L   Potassium 3.5 3.5 - 5.1 mmol/L   Chloride 102 101 - 111 mmol/L   CO2 25 22 - 32 mmol/L   Glucose, Bld 189 (H) 65 - 99 mg/dL   BUN 13 6 - 20 mg/dL   Creatinine, Ser 1.12 (H) 0.44 - 1.00 mg/dL   Calcium 8.0 (L) 8.9 - 10.3 mg/dL   GFR calc non Af Amer 44 (L) >60 mL/min   GFR calc Af Amer 51 (L) >60 mL/min    Comment: (NOTE) The eGFR has been calculated using the CKD EPI equation. This calculation has not been validated in all clinical situations. eGFR's persistently <60 mL/min signify possible Chronic Kidney Disease.    Anion gap 10 5 - 15  Glucose, capillary     Status: Abnormal   Collection Time: 06/27/16  9:46 AM  Result Value Ref Range   Glucose-Capillary 154 (H) 65 - 99 mg/dL  Glucose, capillary     Status: Abnormal   Collection Time: 06/27/16 11:46 AM  Result Value Ref Range   Glucose-Capillary 149 (H) 65 - 99 mg/dL   Comment 1 Notify RN    Comment 2 Document in Chart     Imaging: Dg Chest  Port 1 View  Result Date: 06/26/2016 CLINICAL DATA:  Shortness of breath. Nausea, vomiting and chest pain. EXAM: PORTABLE CHEST 1 VIEW COMPARISON:  06/25/2016 and 06/21/2016 radiographs FINDINGS: Cardiomegaly, large hiatal hernia and pulmonary vascular congestion again noted. Left lower lung atelectasis/consolidation appear slightly increased. There may be a left pleural effusion present. There is no evidence of pneumothorax. Mild right basilar atelectasis again identified. IMPRESSION: Slightly increased left lower lung consolidation/atelectasis with possible left pleural effusion. Cardiomegaly and large hiatal hernia again noted. Electronically Signed   By: Margarette Canada M.D.   On: 06/26/2016 20:09    Assessment:  1. Principal Problem: 2.   Acute gastric volvulus 3. Active Problems: 4.   Essential hypertension, benign 5.   Paroxysmal atrial fibrillation (HCC) 6.   Nausea & vomiting 7.   Chest pain 8.   Nausea and vomiting 9.   Large hiatal hernia 10.   PNA (pneumonia) 11.   Hypervolemia 12.   SOB (shortness of breath) 13.   Acute on chronic congestive heart failure ([place]) 14.   Abnormal TSH 15.   Elevated troponin 16.   Plan:  1. Continue IV diuresis today. Awaiting 2D echo results. May need ischemia work-up if LV function decreased. Last stress test in 2010. EF known to be normal. Would continue IV cardizem given resolving volvulus, ?if she will adequately absorb medication.   Time Spent Directly with Patient:  15 minutes  Length of Stay:  LOS: 5 days   Pixie Casino, MD, [facility] Attending Cardiologist [place] 06/27/2016, 12:04 PM

## 2016-06-28 ENCOUNTER — Inpatient Hospital Stay (HOSPITAL_COMMUNITY): Payer: Medicare Other

## 2016-06-28 DIAGNOSIS — B9789 Other viral agents as the cause of diseases classified elsewhere: Secondary | ICD-10-CM

## 2016-06-28 DIAGNOSIS — I4891 Unspecified atrial fibrillation: Secondary | ICD-10-CM

## 2016-06-28 DIAGNOSIS — J069 Acute upper respiratory infection, unspecified: Secondary | ICD-10-CM

## 2016-06-28 DIAGNOSIS — R079 Chest pain, unspecified: Secondary | ICD-10-CM

## 2016-06-28 LAB — GLUCOSE, CAPILLARY
GLUCOSE-CAPILLARY: 158 mg/dL — AB (ref 65–99)
GLUCOSE-CAPILLARY: 166 mg/dL — AB (ref 65–99)
GLUCOSE-CAPILLARY: 209 mg/dL — AB (ref 65–99)

## 2016-06-28 LAB — CBC
HEMATOCRIT: 35.6 % — AB (ref 36.0–46.0)
Hemoglobin: 11.5 g/dL — ABNORMAL LOW (ref 12.0–15.0)
MCH: 28.3 pg (ref 26.0–34.0)
MCHC: 32.3 g/dL (ref 30.0–36.0)
MCV: 87.5 fL (ref 78.0–100.0)
PLATELETS: 334 10*3/uL (ref 150–400)
RBC: 4.07 MIL/uL (ref 3.87–5.11)
RDW: 15.4 % (ref 11.5–15.5)
WBC: 11.7 10*3/uL — AB (ref 4.0–10.5)

## 2016-06-28 LAB — BASIC METABOLIC PANEL
ANION GAP: 11 (ref 5–15)
BUN: 15 mg/dL (ref 6–20)
CALCIUM: 7.9 mg/dL — AB (ref 8.9–10.3)
CO2: 28 mmol/L (ref 22–32)
CREATININE: 1.05 mg/dL — AB (ref 0.44–1.00)
Chloride: 98 mmol/L — ABNORMAL LOW (ref 101–111)
GFR, EST AFRICAN AMERICAN: 55 mL/min — AB (ref >60)
GFR, EST NON AFRICAN AMERICAN: 47 mL/min — AB (ref >60)
Glucose, Bld: 143 mg/dL — ABNORMAL HIGH (ref 65–99)
Potassium: 3.2 mmol/L — ABNORMAL LOW (ref 3.5–5.1)
SODIUM: 137 mmol/L (ref 135–145)

## 2016-06-28 LAB — ECHOCARDIOGRAM COMPLETE
Height: 66 in
WEIGHTICAEL: 2640 [oz_av]

## 2016-06-28 LAB — BRAIN NATRIURETIC PEPTIDE: B Natriuretic Peptide: 416.4 pg/mL — ABNORMAL HIGH (ref 0.0–100.0)

## 2016-06-28 MED ORDER — METOPROLOL TARTRATE 25 MG PO TABS
25.0000 mg | ORAL_TABLET | Freq: Two times a day (BID) | ORAL | Status: DC
  Administered 2016-06-28 – 2016-07-01 (×7): 25 mg via ORAL
  Filled 2016-06-28 (×7): qty 1

## 2016-06-28 MED ORDER — SODIUM CHLORIDE 0.9 % IV SOLN: 250.0000 mL | INTRAVENOUS | Status: DC

## 2016-06-28 MED ORDER — POTASSIUM CHLORIDE CRYS ER 20 MEQ PO TBCR
40.0000 meq | EXTENDED_RELEASE_TABLET | ORAL | Status: AC
  Administered 2016-06-28 (×2): 40 meq via ORAL
  Filled 2016-06-28 (×2): qty 2

## 2016-06-28 MED ORDER — MENTHOL 3 MG MT LOZG
1.0000 | LOZENGE | OROMUCOSAL | Status: DC | PRN
  Administered 2016-06-28: 3 mg via ORAL
  Filled 2016-06-28: qty 9

## 2016-06-28 MED ORDER — DILTIAZEM HCL-DEXTROSE 100-5 MG/100ML-% IV SOLN (PREMIX)
5.0000 mg/h | INTRAVENOUS | Status: DC
  Administered 2016-06-28 – 2016-06-29 (×5): 10 mg/h via INTRAVENOUS
  Filled 2016-06-28 (×5): qty 100

## 2016-06-28 MED ORDER — SODIUM CHLORIDE 0.9% FLUSH
3.0000 mL | Freq: Two times a day (BID) | INTRAVENOUS | Status: DC
  Administered 2016-06-29 – 2016-07-01 (×3): 3 mL via INTRAVENOUS

## 2016-06-28 MED ORDER — SODIUM CHLORIDE 0.9% FLUSH: 3.0000 mL | INTRAVENOUS | Status: DC | PRN

## 2016-06-28 NOTE — Progress Notes (Signed)
DCCV scheduled for 2pm tomorrow. Dr. Debara Pickett will be writing orders. Noma Quijas PA-C

## 2016-06-28 NOTE — Progress Notes (Addendum)
PROGRESS NOTE    [name]  ZOX:[id number] DOB: Oct 29, 1930 DOA: 06/21/2016 PCP: Wyatt Haste, MD    Brief Narrative:  [name] is a 83 y.o. female with history of paroxysmal atrial fibrillation, hypertension, TIA presents to the ER because of persistent nausea vomiting with chest pain since last evening 6 PM. Patient states she was diagnosed with pneumonia last week and had completed a one-week course of Levaquin, last dose was yesterday. Last evening after having steak half an hour later patient started having nausea vomiting with left upper quadrant abdominal pain with chest pain radiating to the left shoulder. Patient had multiple episodes of vomiting. 2 days ago patient had mild diarrhea.   ED Course: In the ER EKG shows normal sinus rhythm with chest x-ray and KUB being unremarkable. Sonogram of the abdomen does not show any gallstones. Patient still persistently has nausea vomiting. Will be admitted for further observation. Patient was chest pain-free on admission.  Patient was admitted esophagram which was done was concerning for a gastric outlet obstruction versus small bowel obstruction. CT abdomen and pelvis was also done which did show large intrathoracic gastric hernia, no wall thickening or pneumatosis in the visualized stomach, findings most likely representing spontaneous interval reduction of a gastric volvulus. No evidence of gastric outlet obstruction or small bowel obstruction. General surgery was consulted and followed the patient during the hospitalization.  During the hospitalization patient subsequently went into A. fib with RVR as well as acute CHF and also noted to have a left basilar pneumonia. Patient transferred to cardiac telemetry floor placed on a Cardizem drip as well as IV Lasix and empiric IV Zosyn. Cardiac workup initiated. Cardiology consulted.    Assessment & Plan:   Principal Problem:   Acute gastric volvulus Active Problems:   Paroxysmal  atrial fibrillation (HCC)   Large hiatal hernia   Nausea & vomiting   Essential hypertension, benign   Chest pain   Nausea and vomiting   PNA (pneumonia)   Hypervolemia   SOB (shortness of breath)   Acute on chronic congestive heart failure (HCC)   Abnormal TSH   Elevated troponin  #1 large hiatal show hernia probably would spontaneously reduced acute gastric volvulus As noted per esophagram and CT abdomen and pelvis. Patient with clinical improvement with no further nausea or vomiting. Patient tolerating soft diet. Continue PPI. Patient will likely need elective outpatient repair with further workup with EGD, possible manometry, cardiac clearance and eventual upper endoscopy hernia repair/possible fundoplication per general surgery recommendations. General surgery following and appreciate input and recommendations.  #2 probable aspiration pneumonia/volume overload Patient noted to be short of breath over the past 3-4 days. Patient noted to have a worsening leukocytosis with a white count of 15.3 on 06/25/2016 which is trending back down and currently at 11.7 from 13.5. Patient had presented with nausea and vomiting secondary to large facial hernia and spontaneously reduced acute gastric volvulus and as such may have aspirated at that time.  Patient currently afebrile. Chest x-ray with worsening left basilar infiltrate. Patient also was nothing by mouth during initial part of the hospitalization and hydrated with IV fluids. Patient was approximately positive 4L on this admission. Urine Legionella antigen negative. Urine pneumococcus antigen negative. Sputum Gram stain and culture pending. Blood cultures pending with no growth to date. MRSA screening nasal mucosa negative. Continue empiric IV Zosyn due to possibility of poor absorption. Continue Lasix 40 mg IV every 12 hours.  #3 acute CHF exacerbation Likely multifactorial  secondary to A. fib with RVR and probable volume overload secondary to  fluid resuscitation on admission. Patient with bibasilar crackles now which have improved from 06/26/2016. Patient states shortness of breath improving. Patient was +4 L during this hospitalization. Patient with a urine output of 2.5 mL over the past 24 hours.Patient's current weight is 74.8kg form 81.1 kg from 74.8 kg from 77.9 kg. Weights likely unreliable as may have been 2 different scales from different floors. Initial set of cardiac enzymes was elevated however serial troponins have trended down. TSH elevated however free T4 and T3 within normal limits and likely euthyroid sick syndrome. Continue current dose of Lasix 40 mg IV every 12 hours. Strict I's and O's. Daily weights. 2-D echo pending. Cardiology following.    #4 hypokalemia Secondary to diuretics. Repleted.   #5 paroxysmal atrial fibrillation with RVR Likely secondary to spontaneously reduce gastric volvulus and pneumonia. Patient transitioned from IV Lopressor to oral home dose Toprol for rate control. Patient noted to have elevated heart rates which was sustaining with heart rates in the 100s to the 130s. Patient currently on Cardizem drip. Initial set of cardiac enzymes was elevated however serial enzymes have trended down. TSH was elevated at 9.566, free T4 at 0.99, free T3 at 3.2. Likely euthyroid sick syndrome. Discontinued Synthroid. 2-D echo pending.  Patient has been transitioned from IV heparin back to home regimen of eliquis for anticoagulation. Cardiology following and appreciate input and recommendations.  #6 abnormal TSH/euthyroid sick syndrome TSH noted to be elevated at 9.566. Free T3 at 3.2. Free T4 0.99. Likely euthyroid sick syndrome. Discontinued IV Synthroid. Will likely need repeat thyroid function studies done in about 4-6 weeks.  #7 dehydration Resolved. Patient now volume overloaded. Saline lock IVF.   #8 chest pain Secondary to problem #1. Cardiac enzymes initially elevated however have trended down. 2-D  echo pending. Resolved.  #9 hypertension Blood pressure currently stable. Currently on Cardizem drip. Patient's HCTZ and Toprol have been discontinued.   #10 history of TIA Stable. Continue anticoagulation for secondary stroke prevention.   DVT prophylaxis: Eliquis Code Status: Full Family Communication: Updated patient and family at bedside. Disposition Plan: Remain in cardiac telemetry today. Home when tolerating oral intake, continued bowel movements, improvement with shortness of breath and leukocytosis and afib and CHF and per cardiology. Hopefully in the next 1-2 days.   Consultants:   Gen. surgery: Dr.Tsuei 06/22/2016  Cardiology: Dr. Fransico Him 06/26/2016  Procedures:   Esophagram 06/22/2016  CT abdomen and pelvis 06/22/2016  Chest x-ray 06/22/2016, 06/25/2016  Abdominal x-ray 06/22/2016  2-D echo pending 06/28/2016  Antimicrobials:   IV Zosyn 06/25/2016   Subjective: Patient states shortness of breath improving however not at baseline. Palpitations improving. Patient tolerating soft diet.  Patient passing flatus. Patient having bowel movements. Overnight heart rates have ranged from low 90s to 120s on telemetry. Per husband patient has been peeing out in her underwear and unable to make it to the bedside commode in time overnight.  Objective: Vitals:   06/27/16 1816 06/27/16 2113 06/28/16 0300 06/28/16 0322  BP: 110/60 110/60  109/66  Pulse: 85 (!) 104  (!) 104  Resp:  18  18  Temp:  98.5 F (36.9 C)  98.5 F (36.9 C)  TempSrc:  Oral  Oral  SpO2:  95%  95%  Weight:   74.8 kg (165 lb)   Height:        Intake/Output Summary (Last 24 hours) at 06/28/16 0957 Last data filed at  06/28/16 0900  Gross per 24 hour  Intake           796.09 ml  Output             2100 ml  Net         -1303.91 ml   Filed Weights   06/26/16 0600 06/27/16 0500 06/28/16 0300  Weight: 74.8 kg (165 lb) 81.1 kg (178 lb 12.8 oz) 74.8 kg (165 lb)    Examination:  General  exam: Appears calm and comfortable  Respiratory system: Bibasilar crackles. Some coarse breath sounds in the left base. Respiratory effort normal. Cardiovascular system: Irregularly irregular. No JVD, murmurs, rubs, gallops or clicks. Trace- BLE edema Gastrointestinal system: Abdomen is nondistended, soft and nontender. No organomegaly or masses felt. Normal bowel sounds heard. Central nervous system: Alert and oriented. No focal neurological deficits. Extremities: Symmetric 5 x 5 power. Skin: No rashes, lesions or ulcers Psychiatry: Judgement and insight appear normal. Mood & affect appropriate.     Data Reviewed: I have personally reviewed following labs and imaging studies  CBC:  Recent Labs Lab 06/23/16 0010 06/24/16 0733 06/25/16 0849 06/26/16 0622 06/27/16 0140 06/28/16 0153  WBC 13.5* 11.5* 15.3* 13.7* 13.5* 11.7*  NEUTROABS 7.0  --   --   --   --   --   HGB 12.5 11.4* 11.6* 12.2 11.1* 11.5*  HCT 37.9 35.4* 36.4 37.3 34.5* 35.6*  MCV 87.7 87.0 87.5 86.3 86.9 87.5  PLT 279 244 287 278 296 767   Basic Metabolic Panel:  Recent Labs Lab 06/22/16 1513 06/23/16 1034 06/24/16 0733 06/25/16 0849 06/26/16 0622 06/27/16 0140 06/27/16 0839 06/28/16 0153  NA  --  143 141 141 139  --  137 137  K  --  3.4* 3.5 4.2 3.2*  --  3.5 3.2*  CL  --  111 112* 110 102  --  102 98*  CO2  --  26 23 24 25   --  25 28  GLUCOSE  --  107* 133* 138* 157*  --  189* 143*  BUN  --  21* 11 10 9   --  13 15  CREATININE  --  0.80 0.77 0.81 0.91  --  1.12* 1.05*  CALCIUM  --  8.3* 7.9* 8.5* 8.3*  --  8.0* 7.9*  MG 1.8 2.0  --   --  1.8 2.5*  --   --    GFR: Estimated Creatinine Clearance: 41.2 mL/min (by C-G formula based on SCr of 1.05 mg/dL (H)). Liver Function Tests:  Recent Labs Lab 06/21/16 2328 06/22/16 0753  AST 22 31  ALT 18 17  ALKPHOS 46 44  BILITOT 0.6 0.6  PROT 7.0 7.3  ALBUMIN 3.8 3.7    Recent Labs Lab 06/21/16 2328 06/22/16 0753  LIPASE 18 22   No results for  input(s): AMMONIA in the last 168 hours. Coagulation Profile:  Recent Labs Lab 06/22/16 0753  INR 1.31   Cardiac Enzymes:  Recent Labs Lab 06/22/16 1513 06/22/16 1816 06/26/16 1511 06/26/16 1748 06/27/16 0140  TROPONINI <0.03 <0.03 0.46* 0.03* <0.03   BNP (last 3 results) No results for input(s): PROBNP in the last 8760 hours. HbA1C: No results for input(s): HGBA1C in the last 72 hours. CBG:  Recent Labs Lab 06/27/16 0946 06/27/16 1146 06/27/16 1628 06/28/16 0104 06/28/16 0801  GLUCAP 154* 149* 169* 158* 166*   Lipid Profile:  Recent Labs  06/27/16 0140  CHOL 89  HDL 41  LDLCALC 37  TRIG  56  CHOLHDL 2.2   Thyroid Function Tests:  Recent Labs  06/26/16 1511 06/26/16 1748  TSH 9.566*  --   FREET4  --  0.99  T3FREE  --  3.2   Anemia Panel: No results for input(s): VITAMINB12, FOLATE, FERRITIN, TIBC, IRON, RETICCTPCT in the last 72 hours. Sepsis Labs: No results for input(s): PROCALCITON, LATICACIDVEN in the last 168 hours.  Recent Results (from the past 240 hour(s))  Culture, Urine     Status: Abnormal   Collection Time: 06/22/16  8:23 AM  Result Value Ref Range Status   Specimen Description URINE, CLEAN CATCH  Final   Special Requests NONE  Final   Culture MULTIPLE SPECIES PRESENT, SUGGEST RECOLLECTION (A)  Final   Report Status 06/23/2016 FINAL  Final  MRSA PCR Screening     Status: None   Collection Time: 06/25/16 11:11 AM  Result Value Ref Range Status   MRSA by PCR NEGATIVE NEGATIVE Final    Comment:        The GeneXpert MRSA Assay (FDA approved for NASAL specimens only), is one component of a comprehensive MRSA colonization surveillance program. It is not intended to diagnose MRSA infection nor to guide or monitor treatment for MRSA infections.   Culture, blood (routine x 2) Call MD if unable to obtain prior to antibiotics being given     Status: None (Preliminary result)   Collection Time: 06/25/16  2:54 PM  Result Value Ref  Range Status   Specimen Description BLOOD LEFT ANTECUBITAL  Final   Special Requests BOTTLES DRAWN AEROBIC AND ANAEROBIC 10CC  Final   Culture NO GROWTH 2 DAYS  Final   Report Status PENDING  Incomplete  Culture, blood (routine x 2) Call MD if unable to obtain prior to antibiotics being given     Status: None (Preliminary result)   Collection Time: 06/25/16  2:54 PM  Result Value Ref Range Status   Specimen Description BLOOD LEFT HAND  Final   Special Requests BOTTLES DRAWN AEROBIC AND ANAEROBIC 4CC  Final   Culture NO GROWTH 2 DAYS  Final   Report Status PENDING  Incomplete         Radiology Studies: Dg Chest Port 1 View  Result Date: 06/26/2016 CLINICAL DATA:  Shortness of breath. Nausea, vomiting and chest pain. EXAM: PORTABLE CHEST 1 VIEW COMPARISON:  06/25/2016 and 06/21/2016 radiographs FINDINGS: Cardiomegaly, large hiatal hernia and pulmonary vascular congestion again noted. Left lower lung atelectasis/consolidation appear slightly increased. There may be a left pleural effusion present. There is no evidence of pneumothorax. Mild right basilar atelectasis again identified. IMPRESSION: Slightly increased left lower lung consolidation/atelectasis with possible left pleural effusion. Cardiomegaly and large hiatal hernia again noted. Electronically Signed   By: Margarette Canada M.D.   On: 06/26/2016 20:09        Scheduled Meds: . apixaban  5 mg Oral BID  . furosemide  40 mg Intravenous Q12H  . pantoprazole  40 mg Oral Q0600  . piperacillin-tazobactam (ZOSYN)  IV  3.375 g Intravenous Q8H  . potassium chloride  40 mEq Oral Q4H  . pravastatin  40 mg Oral Daily   Continuous Infusions: . dilTIAZem HCl-Dextrose 10 mg/hr (06/28/16 0311)     LOS: 6 days    Time spent: 81 minutes    Tracker Mance, MD [facility] Pager [phone]  If 7PM-7AM, please contact night-coverage [email or web address] Password [facility] 06/28/2016, 9:57 AM

## 2016-06-28 NOTE — Progress Notes (Signed)
Physical Therapy Treatment Patient Details Name: [name] MRN: [id number] DOB: 06-Jul-1930 Today's Date: 06/28/2016    History of Present Illness Pt is an 83 y/o female admitted secondary to acute gastric volvulus and intrathoracic gastric hernia patient subsequently went into A. fib with RVR as well as acute CHF and also noted to have a left basilar pneumonia. PMH includes TIA, HTN, and afib.      PT Comments    Pt pleasant and reports fatigue after last session with P.T. But agreeable to mobility and progression today. Pt able to complete [place] ambulation without RW with HR 82-86 and sats 93-95% on 4L with decreased speed. Pt fatigued after gait with pt deferring any further activity. Pt encouraged to ambulate daily with assist and continue to progress function for return home. Will continue to follow.     Follow Up Recommendations  Home health PT;Supervision/Assistance - 24 hour     Equipment Recommendations  None recommended by PT    Recommendations for Other Services       Precautions / Restrictions Precautions Precautions: Fall Restrictions Weight Bearing Restrictions: No    Mobility  Bed Mobility Overal bed mobility: Modified Independent             General bed mobility comments: mod assist to scoot up in bed in trendelenburg. Pt supine<>sit with HOB 30degrees mod I  Transfers Overall transfer level: Needs assistance   Transfers: Sit to/from Stand Sit to Stand: Supervision         General transfer comment: supervision for lines from bed and toilet  Ambulation/Gait Ambulation/Gait assistance: Min guard Ambulation Distance (Feet): 300 Feet Assistive device: None Gait Pattern/deviations: Step-through pattern;Decreased stride length   Gait velocity interpretation: Below normal speed for age/gender General Gait Details: decreased speed with 3 standing rests and cues for gait speed and self-regulation   Stairs            Wheelchair Mobility     Modified Rankin (Stroke Patients Only)       Balance Overall balance assessment: Needs assistance   Sitting balance-Leahy Scale: Good       Standing balance-Leahy Scale: Good                      Cognition Arousal/Alertness: Awake/alert Behavior During Therapy: WFL for tasks assessed/performed Overall Cognitive Status: Within Functional Limits for tasks assessed                      Exercises      General Comments        Pertinent Vitals/Pain Pain Assessment: No/denies pain    Home Living                      Prior Function            PT Goals (current goals can now be found in the care plan section) Progress towards PT goals: Progressing toward goals    Frequency           PT Plan Current plan remains appropriate    Co-evaluation             End of Session Equipment Utilized During Treatment: Gait belt Activity Tolerance: Patient tolerated treatment well Patient left: in bed;with call bell/phone within Spencer;with family/visitor present Nurse Communication: Mobility status PT Visit Diagnosis: Difficulty in walking, not elsewhere classified (R26.2)     Time: 4098-1191 PT Time Calculation (min) (ACUTE ONLY): 21 min  Charges:  $Gait Training: 8-22 mins                    G Codes:       [name] 2016-07-21, 12:11 PM  [name], [name]

## 2016-06-28 NOTE — Plan of Care (Signed)
Problem: Bowel/Gastric: Goal: Will not experience complications related to bowel motility Outcome: Progressing Pt had BM today.    

## 2016-06-28 NOTE — Progress Notes (Signed)
  Echocardiogram 2D Echocardiogram has been performed.  Breigh Annett L Androw 06/28/2016, 10:34 AM

## 2016-06-28 NOTE — Progress Notes (Signed)
Progress Note  Patient Name: [name] Date of Encounter: 06/28/2016  Primary Cardiologist: Dr. Peter [name]  Subjective    Patient here in hospital after having acute abdomen, fluid resuscitation caused acute on chronic CHF exacerbation with concern for aspiration pneumonia, subsequently Afib with RVR.  Not currently a surgical candidate with the plan to have outpatient elective hernia repair, and other associated work-up per general surgery recommendations.  Patients weight essentially the same as admission weight.  She is on Cardizem drip 10 mg IV gtt. Her heat rate continues to be frequently elevated into 150's today.  Today she is still feeling short of breath and having palpitations.   Inpatient Medications    Scheduled Meds: . apixaban  5 mg Oral BID  . furosemide  40 mg Intravenous Q12H  . metoprolol tartrate  25 mg Oral BID  . pantoprazole  40 mg Oral Q0600  . piperacillin-tazobactam (ZOSYN)  IV  3.375 g Intravenous Q8H  . pravastatin  40 mg Oral Daily   Continuous Infusions: . dilTIAZem HCl-Dextrose 10 mg/hr (06/28/16 1240)   PRN Meds: acetaminophen **OR** acetaminophen, guaiFENesin, hydrALAZINE, loratadine, LORazepam, menthol-cetylpyridinium, ondansetron **OR** ondansetron (ZOFRAN) IV, prochlorperazine, traMADol   Vital Signs    Vitals:   06/28/16 0300 06/28/16 0322 06/28/16 1208 06/28/16 1321  BP:  109/66  (!) 118/56  Pulse:  (!) 104 86 92  Resp:  18  20  Temp:  98.5 F (36.9 C)  98.4 F (36.9 C)  TempSrc:  Oral  Oral  SpO2:  95% 93% 98%  Weight: 165 lb (74.8 kg)     Height:        Intake/Output Summary (Last 24 hours) at 06/28/16 1357 Last data filed at 06/28/16 0900  Gross per 24 hour  Intake           556.09 ml  Output             1800 ml  Net         -1243.91 ml   Filed Weights   06/26/16 0600 06/27/16 0500 06/28/16 0300  Weight: 165 lb (74.8 kg) 178 lb 12.8 oz (81.1 kg) 165 lb (74.8 kg)    Telemetry    atrial fibrillation,  130-150's - Personally Reviewed  ECG    None since 05/29/2016 - Personally Reviewed  Physical Exam  * GEN: No acute distress.   Neck: No JVD Cardiac: tachycardia, irregularly irregular Respiratory: Clear to auscultation bilaterally.  GI: Soft, nontender, non-distended  MS: No edema, 2+; No deformity. Neuro:  Nonfocal  Psych: Normal affect   Labs    Chemistry Recent Labs Lab 06/21/16 2328 06/22/16 0753  06/26/16 0622 06/27/16 0839 06/28/16 0153  NA 139  --   < > 139 137 137  K 3.2*  --   < > 3.2* 3.5 3.2*  CL 98*  --   < > 102 102 98*  CO2 27  --   < > 25 25 28   GLUCOSE 182*  --   < > 157* 189* 143*  BUN 17  --   < > 9 13 15   CREATININE 0.98  --   < > 0.91 1.12* 1.05*  CALCIUM 9.3  --   < > 8.3* 8.0* 7.9*  PROT 7.0 7.3  --   --   --   --   ALBUMIN 3.8 3.7  --   --   --   --   AST 22 31  --   --   --   --  ALT 18 17  --   --   --   --   ALKPHOS 46 44  --   --   --   --   BILITOT 0.6 0.6  --   --   --   --   GFRNONAA 52*  --   < > 56* 44* 47*  GFRAA 60*  --   < > >60 51* 55*  ANIONGAP 14  --   < > 12 10 11   < > = values in this interval not displayed.   Hematology  Recent Labs Lab 06/26/16 0622 06/27/16 0140 06/28/16 0153  WBC 13.7* 13.5* 11.7*  RBC 4.32 3.97 4.07  HGB 12.2 11.1* 11.5*  HCT 37.3 34.5* 35.6*  MCV 86.3 86.9 87.5  MCH 28.2 28.0 28.3  MCHC 32.7 32.2 32.3  RDW 15.1 15.4 15.4  PLT 278 296 334    Cardiac Enzymes  Recent Labs Lab 06/22/16 1816 06/26/16 1511 06/26/16 1748 06/27/16 0140  TROPONINI <0.03 0.46* 0.03* <0.03     Recent Labs Lab 06/21/16 2337  TROPIPOC 0.00     BNP  Recent Labs Lab 06/26/16 1748 06/27/16 0839 06/28/16 0153  BNP 836.8* 492.1* 416.4*     Radiology    Dg Chest Port 1 View Result Date: 06/26/2016   IMPRESSION: Slightly increased left lower lung consolidation/atelectasis with possible left pleural effusion. Cardiomegaly and large hiatal hernia again noted. Electronically Signed   By: Margarette Canada  M.D.   On: 06/26/2016 20:09    Cardiac Studies   Transthoracic echocardiography 06/28/2016 ------------------------------------------------------------------- Study Conclusions  - Left ventricle: The cavity size was normal. Wall thickness was   increased in a pattern of mild LVH. Systolic function was normal.   The estimated ejection fraction was in the range of 50% to 55%.   Wall motion was normal; there were no regional wall motion   abnormalities. Doppler parameters are consistent with abnormal   left ventricular relaxation (grade 1 diastolic dysfunction). - Mitral valve: Moderately calcified annulus. There was moderate   regurgitation. - Left atrium: The atrium was mildly dilated. - Right atrium: The atrium was mildly dilated.  Patient Profile     83 y.o. female with PMH of paroxysmal atrial fibrillation, hypertension, TIA and hiatal hernia who presented to the ER with persistent nausea, vomiting with chest pain since 3/4. Diagnosed with pneumonia a week prior to admission and had completed a one-week course of Levaquin. An hour after having steak she started having N/V with LUQ abdominal pain with CP radiating to the left shoulder. Patient had multiple episodes of vomiting. 2 days ago patient had mild diarrhea.Esophagram was concerning for a gastric outlet obstruction versus small bowel obstruction. CT abdomen and pelvis showed large intrathoracic gastric hernia and felt findings most likely represented spontaneous interval reduction of a gastric volvulus.   No evidence of gastric outlet obstruction or small bowel obstruction. General surgery was consulted and followed the patient during the hospitalization.  She had clinical improvement with resolution of her N/V and started on PPI.  She developed SOB with worsening leukocytosis on 3/9 with left basilar infiltrate on cxray c/w probable aspiration PNA from prior N/V.  She also got IVF hydration and was 4L+ so there was concern for  acute CHF.  She was started on IV lasix 40mg  BID.  She has responded well and put out 2.6L yesterday but still 2L+.  She was also found to be hypothyroid and receiving thyroid replacement IV.    Hospital  stay has also been complicated by afib with RVR and is now controlled on IV Cardizem gtt.  She is on Eliquis.    Assessment & Plan    1.  Acute diastolic CHF - Echo today shows mild LVH, EF 05-18%, grade 1 diastolic dysfunction, moderate mitral valve regurgitation in the setting of a calcified annulus. Continue IV lasix 40 mg BID. Kidney function is tolerating this dose Creatinine/BUN 1.05/15. down  Total urine output is 460 in last 24 hours.   2.  Elevated troponin - initial trop elevated at 0.46 and now 0.03.  This is likely related to acute CHF and PNA and is flat trend.  She has no history of CAD in the past.  Echo showed no wall motion abnormality. Do not feel cath is needed.  3. persistent atrial fibrillation:  Was bridged with Heparin and bridged Eliquis- while in hospital, has been on long term anticoagulation and does not require TEE.. Currently on Cardizem drip at 10mg /hr, will add on Metoprolol Tartrate 25 BID- closely monitor blood pressure and heart rate.  -Dr. Debara Pickett discussed electro cardioversion with pt and family member to covert her back to sinus rhythm, Pt and family member are agreeable. Hopefully will take place in next 1-2 days.  4.  Hypokalemia - 3.2 today, continue to replace.  5.  Large hiatal hernia with probable spontaneous reduced gastric vovulus - per IM  Signed, Linus Mako, PA-C  06/28/2016, 1:57 PM

## 2016-06-28 NOTE — Care Management Important Message (Signed)
Important Message  Patient Details  Name: [name] MRN: [id number] Date of Birth: 07-24-1930   Medicare Important Message Given:  Yes    [name] May 06/28/2016, 1:08 PM

## 2016-06-29 ENCOUNTER — Inpatient Hospital Stay (HOSPITAL_COMMUNITY): Payer: Medicare Other | Admitting: Anesthesiology

## 2016-06-29 ENCOUNTER — Encounter (HOSPITAL_COMMUNITY)
Admission: EM | Disposition: A | Discharge status: Home Health | Payer: Self-pay | Source: Home / Self Care | Attending: Internal Medicine

## 2016-06-29 ENCOUNTER — Encounter (HOSPITAL_COMMUNITY): Payer: Self-pay | Admitting: Certified Registered Nurse Anesthetist

## 2016-06-29 DIAGNOSIS — E876 Hypokalemia: Secondary | ICD-10-CM

## 2016-06-29 DIAGNOSIS — I481 Persistent atrial fibrillation: Secondary | ICD-10-CM

## 2016-06-29 DIAGNOSIS — I5031 Acute diastolic (congestive) heart failure: Secondary | ICD-10-CM

## 2016-06-29 HISTORY — PX: CARDIOVERSION: SHX1299

## 2016-06-29 LAB — CBC
HCT: 36.7 % (ref 36.0–46.0)
Hemoglobin: 11.9 g/dL — ABNORMAL LOW (ref 12.0–15.0)
MCH: 28.1 pg (ref 26.0–34.0)
MCHC: 32.4 g/dL (ref 30.0–36.0)
MCV: 86.8 fL (ref 78.0–100.0)
PLATELETS: 394 10*3/uL (ref 150–400)
RBC: 4.23 MIL/uL (ref 3.87–5.11)
RDW: 15.2 % (ref 11.5–15.5)
WBC: 13.3 10*3/uL — ABNORMAL HIGH (ref 4.0–10.5)

## 2016-06-29 LAB — GLUCOSE, CAPILLARY
Glucose-Capillary: 135 mg/dL — ABNORMAL HIGH (ref 65–99)
Glucose-Capillary: 139 mg/dL — ABNORMAL HIGH (ref 65–99)
Glucose-Capillary: 192 mg/dL — ABNORMAL HIGH (ref 65–99)

## 2016-06-29 LAB — BASIC METABOLIC PANEL
Anion gap: 10 (ref 5–15)
BUN: 16 mg/dL (ref 6–20)
CALCIUM: 8.4 mg/dL — AB (ref 8.9–10.3)
CO2: 26 mmol/L (ref 22–32)
CREATININE: 1.12 mg/dL — AB (ref 0.44–1.00)
Chloride: 100 mmol/L — ABNORMAL LOW (ref 101–111)
GFR calc Af Amer: 51 mL/min — ABNORMAL LOW (ref >60)
GFR, EST NON AFRICAN AMERICAN: 44 mL/min — AB (ref >60)
GLUCOSE: 142 mg/dL — AB (ref 65–99)
Potassium: 4.5 mmol/L (ref 3.5–5.1)
Sodium: 136 mmol/L (ref 135–145)

## 2016-06-29 SURGERY — CARDIOVERSION
Anesthesia: General

## 2016-06-29 MED ORDER — AMOXICILLIN-POT CLAVULANATE 875-125 MG PO TABS
1.0000 | ORAL_TABLET | Freq: Two times a day (BID) | ORAL | Status: DC
  Administered 2016-06-29 – 2016-07-01 (×4): 1 via ORAL
  Filled 2016-06-29 (×4): qty 1

## 2016-06-29 MED ORDER — LOPERAMIDE HCL 2 MG PO CAPS
2.0000 mg | ORAL_CAPSULE | ORAL | Status: DC | PRN
  Administered 2016-06-30 (×2): 2 mg via ORAL
  Filled 2016-06-29 (×2): qty 1

## 2016-06-29 MED ORDER — FENTANYL CITRATE (PF) 100 MCG/2ML IJ SOLN: 25.0000 ug | INTRAMUSCULAR | Status: DC | PRN

## 2016-06-29 MED ORDER — PROPOFOL 10 MG/ML IV BOLUS
INTRAVENOUS | Status: DC | PRN
  Administered 2016-06-29: 50 mg via INTRAVENOUS

## 2016-06-29 MED ORDER — LOPERAMIDE HCL 2 MG PO CAPS
4.0000 mg | ORAL_CAPSULE | Freq: Once | ORAL | Status: AC
  Administered 2016-06-29: 4 mg via ORAL
  Filled 2016-06-29: qty 2

## 2016-06-29 MED ORDER — LIDOCAINE 2% (20 MG/ML) 5 ML SYRINGE
INTRAMUSCULAR | Status: DC | PRN
  Administered 2016-06-29: 60 mg via INTRAVENOUS

## 2016-06-29 MED ORDER — ONDANSETRON HCL 4 MG/2ML IJ SOLN: 4.0000 mg | Freq: Once | INTRAMUSCULAR | Status: DC | PRN

## 2016-06-29 MED ORDER — SODIUM CHLORIDE 0.9 % IV SOLN
INTRAVENOUS | Status: DC | PRN
  Administered 2016-06-29: 17:00:00 via INTRAVENOUS

## 2016-06-29 MED ORDER — PHENYLEPHRINE HCL 10 MG/ML IJ SOLN
INTRAMUSCULAR | Status: DC | PRN
Start: 2016-06-29 — End: 2016-06-29
  Administered 2016-06-29 (×2): 80 ug via INTRAVENOUS

## 2016-06-29 MED ORDER — AMOXICILLIN-POT CLAVULANATE 875-125 MG PO TABS
1.0000 | ORAL_TABLET | Freq: Two times a day (BID) | ORAL | Status: DC
  Administered 2016-06-29: 1 via ORAL
  Filled 2016-06-29: qty 1

## 2016-06-29 NOTE — Progress Notes (Signed)
Dr. Gifford Shave and I interviewed the patient on route to the endoscopy department. Allergies, medications, and history were reviewed verbally with her. Due to computer issues, I had to chart pre-op, and intra-op after the procedure was finished. Dr. Marlou Porch, Dr. Gifford Shave, [facility] CRNA, and I were present for the timeout. Patient shocked 3X and all were unsuccessful with a brief run of NSR. Dr. Marlou Porch to put in a diet order.

## 2016-06-29 NOTE — Progress Notes (Signed)
Progress Note  Patient Name: [name] Date of Encounter: 06/29/2016  Primary Cardiologist: Dr. [name]  Subjective   Feeling better overall but still notes generalized SOB. No chest pain. Also reports being up and down all night long due to both urinating and having several watery stools. She was given immodium by IM and Zosyn was changed to Augmentin. Last stool was more formed per patient.  Inpatient Medications    Scheduled Meds: . amoxicillin-clavulanate  1 tablet Oral Q12H  . apixaban  5 mg Oral BID  . furosemide  40 mg Intravenous Q12H  . metoprolol tartrate  25 mg Oral BID  . pantoprazole  40 mg Oral Q0600  . pravastatin  40 mg Oral Daily  . sodium chloride flush  3 mL Intravenous Q12H   Continuous Infusions: . sodium chloride    . dilTIAZem HCl-Dextrose 10 mg/hr (06/29/16 0937)   PRN Meds: acetaminophen **OR** acetaminophen, guaiFENesin, hydrALAZINE, loperamide, loratadine, LORazepam, menthol-cetylpyridinium, ondansetron **OR** ondansetron (ZOFRAN) IV, prochlorperazine, sodium chloride flush, traMADol   Vital Signs    Vitals:   06/28/16 1438 06/28/16 2106 06/28/16 2252 06/29/16 0449  BP:  95/69 117/77 133/64  Pulse: (!) 107 72 86 87  Resp:   18 18  Temp:   98.1 F (36.7 C) 98.1 F (36.7 C)  TempSrc:   Oral Oral  SpO2:   97% 98%  Weight:    162 lb 14.7 oz (73.9 kg)  Height:        Intake/Output Summary (Last 24 hours) at 06/29/16 1122 Last data filed at 06/28/16 1845  Gross per 24 hour  Intake                0 ml  Output              450 ml  Net             -450 ml   Filed Weights   06/27/16 0500 06/28/16 0300 06/29/16 0449  Weight: 178 lb 12.8 oz (81.1 kg) 165 lb (74.8 kg) 162 lb 14.7 oz (73.9 kg)    Telemetry    Persistent coarse atrial fib with variable rates (60s-120s) - Personally Reviewed  Physical Exam   GEN: No acute distress.  HEENT: Normocephalic, atraumatic, sclera non-icteric. Neck: No JVD or bruits. Cardiac: Irregularly  irregular, rate controlled, no murmurs, rubs, or gallops.  Radials/DP/PT 1+ and equal bilaterally.  Respiratory: Diminished BS at bases. Breathing is unlabored. GI: Soft, nontender, non-distended, BS +x 4. MS: no deformity. Extremities: No clubbing or cyanosis. No edema. Distal pedal pulses are 2+ and equal bilaterally. Neuro:  AAOx3. Follows commands. Psych:  Responds to questions appropriately with a normal affect.  Labs    Chemistry Recent Labs Lab 06/27/16 0839 06/28/16 0153 06/29/16 0233  NA 137 137 136  K 3.5 3.2* 4.5  CL 102 98* 100*  CO2 25 28 26   GLUCOSE 189* 143* 142*  BUN 13 15 16   CREATININE 1.12* 1.05* 1.12*  CALCIUM 8.0* 7.9* 8.4*  GFRNONAA 44* 47* 44*  GFRAA 51* 55* 51*  ANIONGAP 10 11 10      Hematology Recent Labs Lab 06/27/16 0140 06/28/16 0153 06/29/16 0233  WBC 13.5* 11.7* 13.3*  RBC 3.97 4.07 4.23  HGB 11.1* 11.5* 11.9*  HCT 34.5* 35.6* 36.7  MCV 86.9 87.5 86.8  MCH 28.0 28.3 28.1  MCHC 32.2 32.3 32.4  RDW 15.4 15.4 15.2  PLT 296 334 394    Cardiac Enzymes Recent Labs Lab 06/22/16  1816 06/26/16 1511 06/26/16 1748 06/27/16 0140  TROPONINI <0.03 0.46* 0.03* <0.03   No results for input(s): TROPIPOC in the last 168 hours.   BNP Recent Labs Lab 06/26/16 1748 06/27/16 0839 06/28/16 0153  BNP 836.8* 492.1* 416.4*    Radiology    No results found.  Cardiac Studies   2D Echo this admission: Study Conclusions - Left ventricle: The cavity size was normal. Wall thickness was   increased in a pattern of mild LVH. Systolic function was normal.   The estimated ejection fraction was in the range of 50% to 55%.   Wall motion was normal; there were no regional wall motion   abnormalities. Doppler parameters are consistent with abnormal   left ventricular relaxation (grade 1 diastolic dysfunction). - Mitral valve: Moderately calcified annulus. There was moderate   regurgitation. - Left atrium: The atrium was mildly dilated. - Right  atrium: The atrium was mildly dilated.  Patient Profile     83 y.o. female with PAF (CHADSVASC 6), HTN, TIA, hiatal hernia admitted 06/20/16 with n/v/LUQ pain with chest pain radiating to left shoulder with esophogram concerning for gastric outlet obstruction versus small bowel obstruction. CT abdomen and pelvis showed large intrathoracic gastric hernia and felt findings most likely represented spontaneous interval reduction of a gastric volvulus. She's been followed conservatively for this. On 06/25/16 she developed SOB with leukocytosis and CXR concerning for aspiration PNA. She also got IVF hydration so there was concern for acute diastolic CHF. She was also found to be hypothyroid. Hospital stay complicated further by elevated troponin and persistent atrial fib with RVR. 2D echo 06/28/16: mild LVH, EF 50-55%, grade 1 DD, mod MR, mild LAE/RAE.  Assessment & Plan    1. Acute diastolic CHF- SOB improving but not yet at baseline. Weight variable in Epic. Down another 3lb compared to yesterday. With watery diarrhea earlier today, mild bump in Cr, and NPO status, could consider transitioning to oral form today.  2. Elevated troponin- initial trop elevated at 0.46 and now 0.03.This has been felt related to acute CHF and PNA and is flat trend. She has no history of CAD in the past. Echo showed no wall motion abnormality. Once back in sinus, can reassess for angina as OP given atherosclerotic aorta seen on CT abd.  3. Persistent atrial fibrillation:  Was bridged with Heparin then continued on Eliquis while in hospital. She's been on long term anticoagulation and Dr. Debara Pickett does not feel that she requires TEE. Continue cardizem drip and metoprolol for now. Rates improved. Plan DCCV today. Risks/benefits reviewed with patient, she is agreeable.   4. Hypokalemia- 4.5 today with possible slight hemolysis. F/u in AM.  5. Large hiatal herniawith probable spontaneous reduced gastric vovulus, with diarrhea  this AM - per IM.  Signed, Charlie Pitter, PA-C  06/29/2016, 11:22 AM

## 2016-06-29 NOTE — Progress Notes (Signed)
PROGRESS NOTE    [name]  GUY:[id number] DOB: [birth year] DOA: 06/21/2016 PCP: Wyatt Haste, MD    Brief Narrative:  [name] is a 83 y.o. female with history of paroxysmal atrial fibrillation, hypertension, TIA presents to the ER because of persistent nausea vomiting with chest pain since last evening 6 PM. Patient states she was diagnosed with pneumonia last week and had completed a one-week course of Levaquin, last dose was yesterday. Last evening after having steak half an hour later patient started having nausea vomiting with left upper quadrant abdominal pain with chest pain radiating to the left shoulder. Patient had multiple episodes of vomiting. 2 days ago patient had mild diarrhea.   ED Course: In the ER EKG shows normal sinus rhythm with chest x-ray and KUB being unremarkable. Sonogram of the abdomen does not show any gallstones. Patient still persistently has nausea vomiting. Will be admitted for further observation. Patient was chest pain-free on admission.  Patient was admitted esophagram which was done was concerning for a gastric outlet obstruction versus small bowel obstruction. CT abdomen and pelvis was also done which did show large intrathoracic gastric hernia, no wall thickening or pneumatosis in the visualized stomach, findings most likely representing spontaneous interval reduction of a gastric volvulus. No evidence of gastric outlet obstruction or small bowel obstruction. General surgery was consulted and followed the patient during the hospitalization.  During the hospitalization patient subsequently went into A. fib with RVR as well as acute CHF and also noted to have a left basilar pneumonia. Patient transferred to cardiac telemetry floor placed on a Cardizem drip as well as IV Lasix and empiric IV Zosyn. Cardiac workup initiated. Cardiology consulted.2-D echo obtained had a EF of 50-55% with no wall motion abnormalities. Patient continued to feel poorly and  so nature fibrillation and a such patient to undergo DCCV today 06/29/2016 per cardiology.    Assessment & Plan:   Principal Problem:   Acute gastric volvulus Active Problems:   Paroxysmal atrial fibrillation (HCC)   Large hiatal hernia   Nausea & vomiting   Essential hypertension, benign   Chest pain   Nausea and vomiting   PNA (pneumonia)   Hypervolemia   SOB (shortness of breath)   Acute on chronic congestive heart failure (HCC)   Abnormal TSH   Elevated troponin   Viral upper respiratory tract infection  #1 large hiatal show hernia probably would spontaneously reduced acute gastric volvulus As noted per esophagram and CT abdomen and pelvis. Patient with clinical improvement with no further nausea or vomiting. Patient tolerating soft diet. Continue PPI. Patient will likely need elective outpatient repair with further workup with EGD, possible manometry, cardiac clearance and eventual upper endoscopy hernia repair/possible fundoplication per general surgery recommendations. General surgery following and appreciate input and recommendations.  #2 probable aspiration pneumonia/volume overload Patient noted to be short of breath over the past 3-4 days. Patient noted to have a worsening leukocytosis with a white count of 15.3 on 06/25/2016 which is trending back down and fluctuating currently at 13.3 from 11.7 from 13.5. Patient had presented with nausea and vomiting secondary to large facial hernia and spontaneously reduced acute gastric volvulus and as such may have aspirated at that time.  Patient currently afebrile. Chest x-ray with worsening left basilar infiltrate. Patient also was nothing by mouth during initial part of the hospitalization and hydrated with IV fluids. Patient was approximately positive 4L on this admission. Urine Legionella antigen negative. Urine pneumococcus antigen negative. Sputum Gram  stain and culture pending. Blood cultures pending with no growth to date. MRSA  screening nasal mucosa negative. Change IV Zosyn to oral Augmentin to complete course of antibiotic treatment. Continue Lasix 40 mg IV every 12 hours.  #3 acute diastolic CHF exacerbation Likely multifactorial secondary to A. fib with RVR and probable volume overload secondary to fluid resuscitation on admission. Patient with bibasilar crackles now which have improved from 06/26/2016. Patient states shortness of breath improving. Patient was +4 L during this hospitalization. Patient with a urine output of 1.1L over the past 24 hours.Patient's current weight is 73.9 kg from74.8kg form 81.1 kg from 74.8 kg from 77.9 kg.  Initial set of cardiac enzymes was elevated however serial troponins have trended down. TSH elevated however free T4 and T3 within normal limits and likely euthyroid sick syndrome. Continue current dose of Lasix 40 mg IV every 12 hours. Strict I's and O's. Daily weights. 2-D echo ith EF of 50-55%, no wall motion abnormalities, grade 1 diastolic dysfunction, mildly dilated right atrium, moderate MR. Patient for cardioversion today per cardiology. Cardiology following.    #4 hypokalemia Secondary to diuretics. Repleted.   #5 paroxysmal atrial fibrillation with RVR Likely secondary to spontaneously reduce gastric volvulus and pneumonia. Patient transitioned from IV Lopressor to oral home dose Toprol for rate control. Patient noted to have elevated heart rates which was sustaining with heart rates in the 100s to the 130s. Patient currently on Cardizem drip. Initial set of cardiac enzymes was elevated however serial enzymes have trended down. TSH was elevated at 9.566, free T4 at 0.99, free T3 at 3.2. Likely euthyroid sick syndrome. Discontinued Synthroid. 2-D echo with EF of 50-55%, no wall motion abnormalities, grade 1 diastolic dysfunction, mildly dilated right atrium, moderate MR.  Patient has been transitioned from IV heparin back to home regimen of eliquis for anticoagulation. Patient for  cardioversion today per cardiology. Cardiology following and appreciate input and recommendations.  #6 abnormal TSH/euthyroid sick syndrome TSH noted to be elevated at 9.566. Free T3 at 3.2. Free T4 0.99. Likely euthyroid sick syndrome. Discontinued IV Synthroid. Will likely need repeat thyroid function studies done in about 4-6 weeks.  #7 dehydration Resolved. Patient now volume overloaded. Saline lock IVF.   #8 chest pain Secondary to problem #1. Cardiac enzymes initially elevated however have trended down. 2-D echo with EF 50-55%, NWMA, grade 1 diastolic dysfunction. Moderate MR, mildy dilated RA. Resolved.  #9 hypertension Blood pressure currently stable. Currently on Cardizem drip. Patient's HCTZ and Toprol have been discontinued.   #10 history of TIA Stable. Continue anticoagulation for secondary stroke prevention.  #11 loose stools Change IV Zosyn to oral Augmentin. Imodium needed.   DVT prophylaxis: Eliquis Code Status: Full Family Communication: Updated patient and family at bedside. Disposition Plan: Remain in cardiac telemetry . Home when tolerating oral intake, continued bowel movements, improvement with shortness of breath and leukocytosis and afib and CHF and per cardiology. Hopefully in the next 1-2 days and per cardiology..   Consultants:   Gen. surgery: Dr.Tsuei 06/22/2016  Cardiology: Dr. Fransico Him 06/26/2016  Procedures:   Esophagram 06/22/2016  CT abdomen and pelvis 06/22/2016  Chest x-ray 06/22/2016, 06/25/2016  Abdominal x-ray 06/22/2016  2-D echo 06/28/2016  DCCV pending  Antimicrobials:   IV Zosyn 06/25/2016>>>>06/29/2016  Augmentin 06/29/2016   Subjective: Patient states no significant improvement with shortness of breath. Palpitations improving. Patient tolerating soft diet.  Patient passing flatus. Patient states has been having loose stools overnight.  Overnight heart rates have ranged  from low 80s to low 100s on  telemetry.  Objective: Vitals:   06/28/16 1438 06/28/16 2106 06/28/16 2252 06/29/16 0449  BP:  95/69 117/77 133/64  Pulse: (!) 107 72 86 87  Resp:   18 18  Temp:   98.1 F (36.7 C) 98.1 F (36.7 C)  TempSrc:   Oral Oral  SpO2:   97% 98%  Weight:    73.9 kg (162 lb 14.7 oz)  Height:        Intake/Output Summary (Last 24 hours) at 06/29/16 0928 Last data filed at 06/28/16 1845  Gross per 24 hour  Intake                0 ml  Output              600 ml  Net             -600 ml   Filed Weights   06/27/16 0500 06/28/16 0300 06/29/16 0449  Weight: 81.1 kg (178 lb 12.8 oz) 74.8 kg (165 lb) 73.9 kg (162 lb 14.7 oz)    Examination:  General exam: Appears calm and comfortable  Respiratory system: Bibasilar crackles L> R. Respiratory effort normal. Cardiovascular system: Irregularly irregular. No JVD, murmurs, rubs, gallops or clicks. Trace- BLE edema Gastrointestinal system: Abdomen is nondistended, soft and nontender. No organomegaly or masses felt. Normal bowel sounds heard. Central nervous system: Alert and oriented. No focal neurological deficits. Extremities: Symmetric 5 x 5 power. Skin: No rashes, lesions or ulcers Psychiatry: Judgement and insight appear normal. Mood & affect appropriate.     Data Reviewed: I have personally reviewed following labs and imaging studies  CBC:  Recent Labs Lab 06/23/16 0010  06/25/16 0849 06/26/16 0622 06/27/16 0140 06/28/16 0153 06/29/16 0233  WBC 13.5*  < > 15.3* 13.7* 13.5* 11.7* 13.3*  NEUTROABS 7.0  --   --   --   --   --   --   HGB 12.5  < > 11.6* 12.2 11.1* 11.5* 11.9*  HCT 37.9  < > 36.4 37.3 34.5* 35.6* 36.7  MCV 87.7  < > 87.5 86.3 86.9 87.5 86.8  PLT 279  < > 287 278 296 334 394  < > = values in this interval not displayed. Basic Metabolic Panel:  Recent Labs Lab 06/22/16 1513 06/23/16 1034  06/25/16 0849 06/26/16 0622 06/27/16 0140 06/27/16 0839 06/28/16 0153 06/29/16 0233  NA  --  143  < > 141 139  --   137 137 136  K  --  3.4*  < > 4.2 3.2*  --  3.5 3.2* 4.5  CL  --  111  < > 110 102  --  102 98* 100*  CO2  --  26  < > 24 25  --  25 28 26   GLUCOSE  --  107*  < > 138* 157*  --  189* 143* 142*  BUN  --  21*  < > 10 9  --  13 15 16   CREATININE  --  0.80  < > 0.81 0.91  --  1.12* 1.05* 1.12*  CALCIUM  --  8.3*  < > 8.5* 8.3*  --  8.0* 7.9* 8.4*  MG 1.8 2.0  --   --  1.8 2.5*  --   --   --   < > = values in this interval not displayed. GFR: Estimated Creatinine Clearance: 38.4 mL/min (by C-G formula based on SCr of 1.12 mg/dL (H)). Liver Function  Tests: No results for input(s): AST, ALT, ALKPHOS, BILITOT, PROT, ALBUMIN in the last 168 hours. No results for input(s): LIPASE, AMYLASE in the last 168 hours. No results for input(s): AMMONIA in the last 168 hours. Coagulation Profile: No results for input(s): INR, PROTIME in the last 168 hours. Cardiac Enzymes:  Recent Labs Lab 06/22/16 1513 06/22/16 1816 06/26/16 1511 06/26/16 1748 06/27/16 0140  TROPONINI <0.03 <0.03 0.46* 0.03* <0.03   BNP (last 3 results) No results for input(s): PROBNP in the last 8760 hours. HbA1C: No results for input(s): HGBA1C in the last 72 hours. CBG:  Recent Labs Lab 06/28/16 0104 06/28/16 0801 06/28/16 1644 06/29/16 0119 06/29/16 0913  GLUCAP 158* 166* 209* 139* 135*   Lipid Profile:  Recent Labs  06/27/16 0140  CHOL 89  HDL 41  LDLCALC 37  TRIG 55  CHOLHDL 2.2   Thyroid Function Tests:  Recent Labs  06/26/16 1511 06/26/16 1748  TSH 9.566*  --   FREET4  --  0.99  T3FREE  --  3.2   Anemia Panel: No results for input(s): VITAMINB12, FOLATE, FERRITIN, TIBC, IRON, RETICCTPCT in the last 72 hours. Sepsis Labs: No results for input(s): PROCALCITON, LATICACIDVEN in the last 168 hours.  Recent Results (from the past 240 hour(s))  Culture, Urine     Status: Abnormal   Collection Time: 06/22/16  8:23 AM  Result Value Ref Range Status   Specimen Description URINE, CLEAN CATCH  Final    Special Requests NONE  Final   Culture MULTIPLE SPECIES PRESENT, SUGGEST RECOLLECTION (A)  Final   Report Status 06/23/2016 FINAL  Final  MRSA PCR Screening     Status: None   Collection Time: 06/25/16 11:11 AM  Result Value Ref Range Status   MRSA by PCR NEGATIVE NEGATIVE Final    Comment:        The GeneXpert MRSA Assay (FDA approved for NASAL specimens only), is one component of a comprehensive MRSA colonization surveillance program. It is not intended to diagnose MRSA infection nor to guide or monitor treatment for MRSA infections.   Culture, blood (routine x 2) Call MD if unable to obtain prior to antibiotics being given     Status: None (Preliminary result)   Collection Time: 06/25/16  2:54 PM  Result Value Ref Range Status   Specimen Description BLOOD LEFT ANTECUBITAL  Final   Special Requests BOTTLES DRAWN AEROBIC AND ANAEROBIC 10CC  Final   Culture NO GROWTH 3 DAYS  Final   Report Status PENDING  Incomplete  Culture, blood (routine x 2) Call MD if unable to obtain prior to antibiotics being given     Status: None (Preliminary result)   Collection Time: 06/25/16  2:54 PM  Result Value Ref Range Status   Specimen Description BLOOD LEFT HAND  Final   Special Requests BOTTLES DRAWN AEROBIC AND ANAEROBIC 4CC  Final   Culture NO GROWTH 3 DAYS  Final   Report Status PENDING  Incomplete         Radiology Studies: No results found.      Scheduled Meds: . apixaban  5 mg Oral BID  . furosemide  40 mg Intravenous Q12H  . metoprolol tartrate  25 mg Oral BID  . pantoprazole  40 mg Oral Q0600  . piperacillin-tazobactam (ZOSYN)  IV  3.375 g Intravenous Q8H  . pravastatin  40 mg Oral Daily  . sodium chloride flush  3 mL Intravenous Q12H   Continuous Infusions: . sodium chloride    .  dilTIAZem HCl-Dextrose 10 mg/hr (06/28/16 2251)     LOS: 7 days    Time spent: 45 minutes    THOMPSON,DANIEL, MD [facility] Pager [phone]  If 7PM-7AM, please  contact night-coverage [email or web address] Password [facility] 06/29/2016, 9:28 AM

## 2016-06-29 NOTE — Transfer of Care (Signed)
Immediate Anesthesia Transfer of Care Note  Patient: [name]  Procedure(s) Performed: Procedure(s): CARDIOVERSION (N/A)  Patient Location: Endoscopy Unit  Anesthesia Type:General  Level of Consciousness: awake, alert , oriented and patient cooperative  Airway & Oxygen Therapy: Patient Spontanous Breathing and Patient connected to nasal cannula oxygen  Post-op Assessment: Report given to RN and Post -op Vital signs reviewed and stable  Post vital signs: Reviewed and stable  Last Vitals:  Vitals:   06/29/16 1721 06/29/16 1722  BP:    Pulse: 79 69  Resp: (!) 22 (!) 22  Temp:      Last Pain:  Vitals:   06/29/16 1720  TempSrc: Oral  PainSc:       Patients Stated Pain Goal: 0 (39/76/73 [id number])  Complications: No apparent anesthesia complications

## 2016-06-29 NOTE — Interval H&P Note (Signed)
History and Physical Interval Note:  06/29/2016 5:06 PM  [name]  has presented today for surgery, with the diagnosis of A-Fib  The various methods of treatment have been discussed with the patient and family. After consideration of risks, benefits and other options for treatment, the patient has consented to  Procedure(s): CARDIOVERSION (N/A) as a surgical intervention .  The patient's history has been reviewed, patient examined, no change in status, stable for surgery.  I have reviewed the patient's chart and labs.  Questions were answered to the patient's satisfaction.     [employer]

## 2016-06-29 NOTE — H&P (View-Only) (Signed)
Progress Note  Patient Name: [name] Date of Encounter: 06/29/2016  Primary Cardiologist: Dr. [name]  Subjective   Feeling better overall but still notes generalized SOB. No chest pain. Also reports being up and down all night long due to both urinating and having several watery stools. She was given immodium by IM and Zosyn was changed to Augmentin. Last stool was more formed per patient.  Inpatient Medications    Scheduled Meds: . amoxicillin-clavulanate  1 tablet Oral Q12H  . apixaban  5 mg Oral BID  . furosemide  40 mg Intravenous Q12H  . metoprolol tartrate  25 mg Oral BID  . pantoprazole  40 mg Oral Q0600  . pravastatin  40 mg Oral Daily  . sodium chloride flush  3 mL Intravenous Q12H   Continuous Infusions: . sodium chloride    . dilTIAZem HCl-Dextrose 10 mg/hr (06/29/16 0937)   PRN Meds: acetaminophen **OR** acetaminophen, guaiFENesin, hydrALAZINE, loperamide, loratadine, LORazepam, menthol-cetylpyridinium, ondansetron **OR** ondansetron (ZOFRAN) IV, prochlorperazine, sodium chloride flush, traMADol   Vital Signs    Vitals:   06/28/16 1438 06/28/16 2106 06/28/16 2252 06/29/16 0449  BP:  95/69 117/77 133/64  Pulse: (!) 107 72 86 87  Resp:   18 18  Temp:   98.1 F (36.7 C) 98.1 F (36.7 C)  TempSrc:   Oral Oral  SpO2:   97% 98%  Weight:    162 lb 14.7 oz (73.9 kg)  Height:        Intake/Output Summary (Last 24 hours) at 06/29/16 1122 Last data filed at 06/28/16 1845  Gross per 24 hour  Intake                0 ml  Output              450 ml  Net             -450 ml   Filed Weights   06/27/16 0500 06/28/16 0300 06/29/16 0449  Weight: 178 lb 12.8 oz (81.1 kg) 165 lb (74.8 kg) 162 lb 14.7 oz (73.9 kg)    Telemetry    Persistent coarse atrial fib with variable rates (60s-120s) - Personally Reviewed  Physical Exam   GEN: No acute distress.  HEENT: Normocephalic, atraumatic, sclera non-icteric. Neck: No JVD or bruits. Cardiac: Irregularly  irregular, rate controlled, no murmurs, rubs, or gallops.  Radials/DP/PT 1+ and equal bilaterally.  Respiratory: Diminished BS at bases. Breathing is unlabored. GI: Soft, nontender, non-distended, BS +x 4. MS: no deformity. Extremities: No clubbing or cyanosis. No edema. Distal pedal pulses are 2+ and equal bilaterally. Neuro:  AAOx3. Follows commands. Psych:  Responds to questions appropriately with a normal affect.  Labs    Chemistry Recent Labs Lab 06/27/16 0839 06/28/16 0153 06/29/16 0233  NA 137 137 136  K 3.5 3.2* 4.5  CL 102 98* 100*  CO2 25 28 26   GLUCOSE 189* 143* 142*  BUN 13 15 16   CREATININE 1.12* 1.05* 1.12*  CALCIUM 8.0* 7.9* 8.4*  GFRNONAA 44* 47* 44*  GFRAA 51* 55* 51*  ANIONGAP 10 11 10      Hematology Recent Labs Lab 06/27/16 0140 06/28/16 0153 06/29/16 0233  WBC 13.5* 11.7* 13.3*  RBC 3.97 4.07 4.23  HGB 11.1* 11.5* 11.9*  HCT 34.5* 35.6* 36.7  MCV 86.9 87.5 86.8  MCH 28.0 28.3 28.1  MCHC 32.2 32.3 32.4  RDW 15.4 15.4 15.2  PLT 296 334 394    Cardiac Enzymes Recent Labs Lab 06/22/16  1816 06/26/16 1511 06/26/16 1748 06/27/16 0140  TROPONINI <0.03 0.46* 0.03* <0.03   No results for input(s): TROPIPOC in the last 168 hours.   BNP Recent Labs Lab 06/26/16 1748 06/27/16 0839 06/28/16 0153  BNP 836.8* 492.1* 416.4*    Radiology    No results found.  Cardiac Studies   2D Echo this admission: Study Conclusions - Left ventricle: The cavity size was normal. Wall thickness was   increased in a pattern of mild LVH. Systolic function was normal.   The estimated ejection fraction was in the range of 50% to 55%.   Wall motion was normal; there were no regional wall motion   abnormalities. Doppler parameters are consistent with abnormal   left ventricular relaxation (grade 1 diastolic dysfunction). - Mitral valve: Moderately calcified annulus. There was moderate   regurgitation. - Left atrium: The atrium was mildly dilated. - Right  atrium: The atrium was mildly dilated.  Patient Profile     83 y.o. female with PAF (CHADSVASC 6), HTN, TIA, hiatal hernia admitted 06/20/16 with n/v/LUQ pain with chest pain radiating to left shoulder with esophogram concerning for gastric outlet obstruction versus small bowel obstruction. CT abdomen and pelvis showed large intrathoracic gastric hernia and felt findings most likely represented spontaneous interval reduction of a gastric volvulus. She's been followed conservatively for this. On 06/25/16 she developed SOB with leukocytosis and CXR concerning for aspiration PNA. She also got IVF hydration so there was concern for acute diastolic CHF. She was also found to be hypothyroid. Hospital stay complicated further by elevated troponin and persistent atrial fib with RVR. 2D echo 06/28/16: mild LVH, EF 50-55%, grade 1 DD, mod MR, mild LAE/RAE.  Assessment & Plan    1. Acute diastolic CHF- SOB improving but not yet at baseline. Weight variable in Epic. Down another 3lb compared to yesterday. With watery diarrhea earlier today, mild bump in Cr, and NPO status, could consider transitioning to oral form today.  2. Elevated troponin- initial trop elevated at 0.46 and now 0.03.This has been felt related to acute CHF and PNA and is flat trend. She has no history of CAD in the past. Echo showed no wall motion abnormality. Once back in sinus, can reassess for angina as OP given atherosclerotic aorta seen on CT abd.  3. Persistent atrial fibrillation:  Was bridged with Heparin then continued on Eliquis while in hospital. She's been on long term anticoagulation and Dr. Debara Pickett does not feel that she requires TEE. Continue cardizem drip and metoprolol for now. Rates improved. Plan DCCV today. Risks/benefits reviewed with patient, she is agreeable.   4. Hypokalemia- 4.5 today with possible slight hemolysis. F/u in AM.  5. Large hiatal herniawith probable spontaneous reduced gastric vovulus, with diarrhea  this AM - per IM.  Signed, Charlie Pitter, PA-C  06/29/2016, 11:22 AM

## 2016-06-29 NOTE — Plan of Care (Signed)
Problem: Nutrition: Goal: Adequate nutrition will be maintained Outcome: Progressing Tolerating soft diet well   Problem: Bowel/Gastric: Goal: Will not experience complications related to bowel motility Outcome: Progressing Patient having loose stools, Immodium started

## 2016-06-29 NOTE — CV Procedure (Signed)
    Electrical Cardioversion Procedure Note [name] [id number] [birth year]  Procedure: Electrical Cardioversion Indications:  Atrial Fibrillation  Time Out: Verified patient identification, verified procedure,medications/allergies/relevent history reviewed, required imaging and test results available.  Performed  Procedure Details  The patient was NPO after midnight. Anesthesia was administered at the beside  by Dr.Turk with propofol.  Cardioversion was performed with synchronized biphasic defibrillation via AP pads with 120, 150, 200 joules.  3 attempt(s) were performed.  The patient converted to normal sinus rhythm. The patient tolerated the procedure well   IMPRESSION:  Unsuccessful cardioversion of atrial fibrillation. She converted for only 10-15 seconds then went back into atrial fibrillation. Discussed with husband.     [name] 06/29/2016, 5:24 PM

## 2016-06-29 NOTE — Anesthesia Preprocedure Evaluation (Signed)
Anesthesia Evaluation  Patient identified by MRN, date of birth, ID band Patient awake    Reviewed: Allergy & Precautions, NPO status , Patient's Chart, lab work & pertinent test results  Airway Mallampati: II  TM Distance: >3 FB Neck ROM: Full    Dental  (+) Teeth Intact, Dental Advisory Given   Pulmonary pneumonia,    Pulmonary exam normal breath sounds clear to auscultation       Cardiovascular hypertension, Pt. on medications + Peripheral Vascular Disease and +CHF  + dysrhythmias Atrial Fibrillation  Rhythm:Irregular Rate:Abnormal     Neuro/Psych  Headaches, TIA Neuromuscular disease    GI/Hepatic negative GI ROS, Neg liver ROS,   Endo/Other  negative endocrine ROS  Renal/GU negative Renal ROS     Musculoskeletal  (+) Arthritis , Osteoarthritis,    Abdominal   Peds  Hematology  (+) Blood dyscrasia, anemia ,   Anesthesia Other Findings Day of surgery medications reviewed with the patient.  Reproductive/Obstetrics                             Anesthesia Physical Anesthesia Plan  ASA: III  Anesthesia Plan: General   Post-op Pain Management:    Induction: Intravenous  Airway Management Planned: Mask  Additional Equipment:   Intra-op Plan:   Post-operative Plan: Extubation in OR  Informed Consent: I have reviewed the patients History and Physical, chart, labs and discussed the procedure including the risks, benefits and alternatives for the proposed anesthesia with the patient or authorized representative who has indicated his/her understanding and acceptance.   Dental advisory given  Plan Discussed with: CRNA  Anesthesia Plan Comments: (Risks/benefits of general anesthesia discussed with patient including risk of damage to teeth, lips, gum, and tongue, nausea/vomiting, allergic reactions to medications, and the possibility of heart attack, stroke and death.  All patient  questions answered.  Patient wishes to proceed.)        Anesthesia Quick Evaluation

## 2016-06-30 ENCOUNTER — Inpatient Hospital Stay (HOSPITAL_COMMUNITY): Payer: Medicare Other

## 2016-06-30 ENCOUNTER — Encounter (HOSPITAL_COMMUNITY): Payer: Self-pay | Admitting: Cardiology

## 2016-06-30 DIAGNOSIS — J181 Lobar pneumonia, unspecified organism: Secondary | ICD-10-CM

## 2016-06-30 DIAGNOSIS — R112 Nausea with vomiting, unspecified: Secondary | ICD-10-CM

## 2016-06-30 LAB — BASIC METABOLIC PANEL
Anion gap: 9 (ref 5–15)
BUN: 20 mg/dL (ref 6–20)
CALCIUM: 8.3 mg/dL — AB (ref 8.9–10.3)
CO2: 27 mmol/L (ref 22–32)
CREATININE: 1.08 mg/dL — AB (ref 0.44–1.00)
Chloride: 100 mmol/L — ABNORMAL LOW (ref 101–111)
GFR calc Af Amer: 53 mL/min — ABNORMAL LOW (ref >60)
GFR, EST NON AFRICAN AMERICAN: 46 mL/min — AB (ref >60)
Glucose, Bld: 143 mg/dL — ABNORMAL HIGH (ref 65–99)
Potassium: 3.2 mmol/L — ABNORMAL LOW (ref 3.5–5.1)
SODIUM: 136 mmol/L (ref 135–145)

## 2016-06-30 LAB — CBC
HEMATOCRIT: 35.8 % — AB (ref 36.0–46.0)
Hemoglobin: 11.8 g/dL — ABNORMAL LOW (ref 12.0–15.0)
MCH: 28.4 pg (ref 26.0–34.0)
MCHC: 33 g/dL (ref 30.0–36.0)
MCV: 86.1 fL (ref 78.0–100.0)
PLATELETS: 402 10*3/uL — AB (ref 150–400)
RBC: 4.16 MIL/uL (ref 3.87–5.11)
RDW: 15 % (ref 11.5–15.5)
WBC: 10.7 10*3/uL — AB (ref 4.0–10.5)

## 2016-06-30 LAB — GLUCOSE, CAPILLARY
GLUCOSE-CAPILLARY: 109 mg/dL — AB (ref 65–99)
Glucose-Capillary: 133 mg/dL — ABNORMAL HIGH (ref 65–99)

## 2016-06-30 LAB — MAGNESIUM: MAGNESIUM: 2.1 mg/dL (ref 1.7–2.4)

## 2016-06-30 LAB — CULTURE, BLOOD (ROUTINE X 2)
CULTURE: NO GROWTH
Culture: NO GROWTH

## 2016-06-30 MED ORDER — DILTIAZEM HCL 30 MG PO TABS
45.0000 mg | ORAL_TABLET | Freq: Four times a day (QID) | ORAL | Status: DC
  Administered 2016-06-30 (×2): 45 mg via ORAL
  Filled 2016-06-30 (×2): qty 2

## 2016-06-30 MED ORDER — DILTIAZEM HCL 30 MG PO TABS: 30.0000 mg | ORAL_TABLET | Freq: Once | ORAL | Status: DC

## 2016-06-30 MED ORDER — DILTIAZEM HCL 30 MG PO TABS
30.0000 mg | ORAL_TABLET | Freq: Four times a day (QID) | ORAL | Status: DC
  Administered 2016-06-30 (×2): 30 mg via ORAL
  Filled 2016-06-30 (×3): qty 1

## 2016-06-30 MED ORDER — FUROSEMIDE 40 MG PO TABS
40.0000 mg | ORAL_TABLET | Freq: Every day | ORAL | Status: DC
  Administered 2016-07-01: 40 mg via ORAL
  Filled 2016-06-30: qty 1

## 2016-06-30 MED ORDER — POTASSIUM CHLORIDE CRYS ER 20 MEQ PO TBCR
40.0000 meq | EXTENDED_RELEASE_TABLET | ORAL | Status: AC
  Administered 2016-06-30 (×2): 40 meq via ORAL
  Filled 2016-06-30 (×2): qty 2

## 2016-06-30 NOTE — Anesthesia Postprocedure Evaluation (Signed)
Anesthesia Post Note  Patient: [name]  Procedure(s) Performed: Procedure(s) (LRB): CARDIOVERSION (N/A)  Patient location during evaluation: Endoscopy Anesthesia Type: General Level of consciousness: awake and alert Pain management: pain level controlled Vital Signs Assessment: post-procedure vital signs reviewed and stable Respiratory status: spontaneous breathing, nonlabored ventilation, respiratory function stable and patient connected to nasal cannula oxygen Cardiovascular status: blood pressure returned to baseline and stable Postop Assessment: no signs of nausea or vomiting Anesthetic complications: no       Last Vitals:  Vitals:   06/30/16 0522 06/30/16 1230  BP: (!) 111/57 112/71  Pulse: 73 (!) 121  Resp: 18   Temp: 37.1 C     Last Pain:  Vitals:   06/30/16 0522  TempSrc: Oral  PainSc:                  [name]

## 2016-06-30 NOTE — Progress Notes (Signed)
Physical Therapy Treatment Patient Details Name: [name] MRN: [id number] DOB: [birth year] Today's Date: 06/30/2016    History of Present Illness Pt is an 83 y/o female admitted secondary to acute gastric volvulus and intrathoracic gastric hernia patient subsequently went into A. fib with RVR as well as acute CHF and also noted to have a left basilar pneumonia. PMH includes TIA, HTN, and afib.      PT Comments    Pt used BSC prior to ambulating in hallway however still was incontinent of bowels near end of ambulation.  Pt on 2L O2 [place] upon entering room and SpO2 dropped to 83% on 2L while ambulating.  Pt required 3L O2 for SPO2 to improve to 93% during ambulation.  Pt possibly to d/c home tomorrow.   Follow Up Recommendations  Home health PT;Supervision/Assistance - 24 hour     Equipment Recommendations  None recommended by PT    Recommendations for Other Services       Precautions / Restrictions Precautions Precautions: Fall Precaution Comments: incontinent of bowels today 3/14    Mobility  Bed Mobility Overal bed mobility: Modified Independent                Transfers                    Ambulation/Gait Ambulation/Gait assistance: Min guard Ambulation Distance (Feet): 300 Feet Assistive device: None Gait Pattern/deviations: Step-through pattern;Decreased stride length     General Gait Details: slow gait speed, SpO2 down to 83% on 2L so increased to 3L O2 and SPO2 93% during gait, pt incontinent of bowels close to returning to room   Stairs            Wheelchair Mobility    Modified Rankin (Stroke Patients Only)       Balance                                    Cognition Arousal/Alertness: Awake/alert Behavior During Therapy: WFL for tasks assessed/performed Overall Cognitive Status: Within Functional Limits for tasks assessed                      Exercises      General Comments        Pertinent  Vitals/Pain Pain Assessment: No/denies pain    Home Living                      Prior Function            PT Goals (current goals can now be found in the care plan section) Progress towards PT goals: Progressing toward goals    Frequency    Min 3X/week      PT Plan Current plan remains appropriate    Co-evaluation             End of Session Equipment Utilized During Treatment: Oxygen Activity Tolerance: Patient tolerated treatment well Patient left: in bed;with call bell/phone within reach   PT Visit Diagnosis: Difficulty in walking, not elsewhere classified (R26.2)     Time: 1884-1660 PT Time Calculation (min) (ACUTE ONLY): 22 min  Charges:  $Gait Training: 8-22 mins                    G Codes:       Tamzin Bertling,KATHrine E 06/30/2016, 3:06 PM Carmelia Bake, PT, DPT 06/30/2016 Pager: [phone]

## 2016-06-30 NOTE — Progress Notes (Signed)
Progress Note  Patient Name: [name] Date of Encounter: 06/30/2016  Primary Cardiologist: Dr. [name]  Subjective   Overall feeling better. Still some dyspnea but no orthopnea, PND or edema. No chest pain.  DCCV was unsuccessful yesterday, however, she went back into NSR around 2200 last night and has been maintaining ever since.  Still with some diarrhea.  Inpatient Medications    Scheduled Meds: . amoxicillin-clavulanate  1 tablet Oral Q12H  . apixaban  5 mg Oral BID  . diltiazem  45 mg Oral Q6H  . furosemide  40 mg Intravenous Q12H  . metoprolol tartrate  25 mg Oral BID  . pantoprazole  40 mg Oral Q0600  . potassium chloride  40 mEq Oral Q2H  . pravastatin  40 mg Oral Daily  . sodium chloride flush  3 mL Intravenous Q12H   Continuous Infusions: . sodium chloride     PRN Meds: acetaminophen **OR** acetaminophen, guaiFENesin, hydrALAZINE, loperamide, loratadine, LORazepam, menthol-cetylpyridinium, ondansetron **OR** ondansetron (ZOFRAN) IV, prochlorperazine, sodium chloride flush, traMADol   Vital Signs    Vitals:   06/29/16 1750 06/29/16 2051 06/29/16 2353 06/30/16 0522  BP: (!) 112/58 99/68 (!) 108/50 (!) 111/57  Pulse:  93 73 73  Resp: 18 18 18 18   Temp:  98.1 F (36.7 C) 98.2 F (36.8 C) 98.8 F (37.1 C)  TempSrc:  Oral Oral Oral  SpO2: 98% 96% 98% 95%  Weight:    161 lb 11.2 oz (73.3 kg)  Height:        Intake/Output Summary (Last 24 hours) at 06/30/16 1045 Last data filed at 06/30/16 0730  Gross per 24 hour  Intake              440 ml  Output                0 ml  Net              440 ml   Filed Weights   06/28/16 0300 06/29/16 0449 06/30/16 0522  Weight: 165 lb (74.8 kg) 162 lb 14.7 oz (73.9 kg) 161 lb 11.2 oz (73.3 kg)    Telemetry     she went back into NSR around 2200 last night and has been maintaining ever since, rare PVC - Personally Reviewed  Physical Exam   GEN: No acute distress.  HEENT: Normocephalic, atraumatic, sclera  non-icteric. Neck:No JVD or bruits. Cardiac:RRR, rate controlled, no murmurs, rubs, or gallops.  Radials/DP/PT 1+ and equal bilaterally.  Respiratory: Diminished BS throughout. Breathing is unlabored. ZO:XWRU, nontender, non-distended, BS +x 4. MS:no deformity. Extremities: No clubbing or cyanosis. No edema. Distal pedal pulses are 2+ and equal bilaterally. Neuro:AAOx3. Follows commands. Psych:  Responds to questions appropriately with a normal affect.  Labs    Chemistry Recent Labs Lab 06/28/16 0153 06/29/16 0233 06/30/16 0145  NA 137 136 136  K 3.2* 4.5 3.2*  CL 98* 100* 100*  CO2 28 26 27   GLUCOSE 143* 142* 143*  BUN 15 16 20   CREATININE 1.05* 1.12* 1.08*  CALCIUM 7.9* 8.4* 8.3*  GFRNONAA 47* 44* 46*  GFRAA 55* 51* 53*  ANIONGAP 11 10 9      Hematology Recent Labs Lab 06/28/16 0153 06/29/16 0233 06/30/16 0145  WBC 11.7* 13.3* 10.7*  RBC 4.07 4.23 4.16  HGB 11.5* 11.9* 11.8*  HCT 35.6* 36.7 35.8*  MCV 87.5 86.8 86.1  MCH 28.3 28.1 28.4  MCHC 32.3 32.4 33.0  RDW 15.4 15.2 15.0  PLT 334 394  402*    Cardiac Enzymes Recent Labs Lab 06/26/16 1511 06/26/16 1748 06/27/16 0140  TROPONINI 0.46* 0.03* <0.03   No results for input(s): TROPIPOC in the last 168 hours.   BNP Recent Labs Lab 06/26/16 1748 06/27/16 0839 06/28/16 0153  BNP 836.8* 492.1* 416.4*     Radiology    Dg Chest 2 View  Result Date: 06/30/2016 CLINICAL DATA:  Shortness of breath and left-sided pleural effusion EXAM: CHEST  2 VIEW COMPARISON:  June 26, 2016 FINDINGS: There is a large hiatal type hernia. There is persistent airspace consolidation in the left lower lobe with fairly small left pleural effusion. There is a minimal right pleural effusion. Lungs elsewhere clear. Heart is slightly enlarged with pulmonary vascularity within normal limits. No adenopathy. There is atherosclerotic calcification in the aorta. There is degenerative change in the thoracic spine. IMPRESSION:  Persistent airspace consolidation left lower lobe with small left pleural effusion. Minimal right pleural effusion. Lungs elsewhere clear. Stable cardiac prominence. Sizable hiatal hernia present. There is aortic atherosclerosis. Electronically Signed   By: Lowella Grip III M.D.   On: 06/30/2016 09:13    Cardiac Studies   2D Echo this admission: Study Conclusions - Left ventricle: The cavity size was normal. Wall thickness was increased in a pattern of mild LVH. Systolic function was normal. The estimated ejection fraction was in the range of 50% to 55%. Wall motion was normal; there were no regional wall motion abnormalities. Doppler parameters are consistent with abnormal left ventricular relaxation (grade 1 diastolic dysfunction). - Mitral valve: Moderately calcified annulus. There was moderate regurgitation. - Left atrium: The atrium was mildly dilated. - Right atrium: The atrium was mildly dilated.  Patient Profile     83 y.o. female with PAF (CHADSVASC 6), HTN, TIA, hiatal hernia admitted 06/20/16 with n/v/LUQ pain with chest pain radiating to left shoulder with esophogram concerning for gastric outlet obstruction versus small bowel obstruction. CT abdomen and pelvis showedlarge intrathoracic gastric hernia and feltfindings most likely representedspontaneous interval reduction of a gastric volvulus. She's been followed conservatively for this. On 06/25/16 she developed SOB with leukocytosis and CXR concerning for aspiration PNA. She also got IVF hydration so there was concern for acute diastolic CHF. She was also found to be hypothyroid. Hospital stay complicated further by elevated troponin and persistent atrial fib with RVR. 2D echo 06/28/16: mild LVH, EF 50-55%, grade 1 DD, mod MR, mild LAE/RAE.  Assessment & Plan    1. Acute diastolic CHF- suspect related to IV fluids and atrial fib. Weight is now below prior OP weights. She still has some dyspnea but this may be  related to PNA. Appears euvolemic. Got IV Lasix this AM. Switch to oral form in AM.  2. Elevated troponin- initial trop elevated at 0.46 downtrending to 0.03.This has been felt related to acute CHF and PNA and is flat trend. She has no history of CAD in the past. Echo showed no wall motion abnormality. Can reassess for angina as OP given atherosclerotic aorta seen on CT abd, giving her time to recover from acute illness. She's not had any chest pain.  3. Persistent atrial fibrillation: she's been on long term anticoagulation prior to admission. Initially PAF this admission then persistent. DCCV 06/29/16 was unsuccessful but she actually converted on her own later last night. IM has converted to oral cardizem. Anticipate transition to consolidated form in AM, along with metoprolol as well. Continue Eliquis. IM is managing abnormal TFTs.  4. Hypokalemia- IM is repleting. Switching  IV Lasix to oral today. F/u in AM.  5. Large hiatal herniawith probable spontaneous reduced gastric vovulus, with diarrhea - per IM.  6. Aspiration PNA - per IM.  Signed, Charlie Pitter, PA-C  06/30/2016, 10:45 AM

## 2016-06-30 NOTE — Progress Notes (Signed)
PT Cancellation Note  Patient Details Name: [name] MRN: [id number] DOB: Jan 18, 1931   Cancelled Treatment:    Reason Eval/Treat Not Completed: Checked on pt 3 attempts, using BSC, then eating, then asleep and declined ambulation.  Requested ambulation later today.  Will check back as schedule permits.    Kylynn Street LUBECK 06/30/2016, 1:03 PM

## 2016-06-30 NOTE — Care Management Note (Signed)
Case Management Note Previous CM note initiated by Sharin Mons, RN 06/22/2016, 4:20 PM    Patient Details  Name: [name] MRN: [id number] Date of Birth: 12-14-1930  Subjective/Objective:    Patient presents with N/V, chest pain, hx of paroxysmal atrial fibrillation, hypertension, TIA . From [facility] facility with husband. Independent with ADL's , no DME usage PTA.  RAYLIN DIGUGLIELMO (Spouse)      [phone]      PCP: Jill Alexanders  Action/Plan: Plan is to d/c to home when medically stable. CM to f/u with disposition needs.  Expected Discharge Date:                  Expected Discharge Plan:  [place]  In-House Referral:     Discharge planning Services  CM Consult  Post Acute Care Choice:  Home Health Choice offered to:  Patient  DME Arranged:    DME Agency:     HH Arranged:  PT HH Agency:  [facility]  Status of Service:  In process, will continue to follow  If discussed at Long Length of Stay Meetings, dates discussed:  3/13  Discharge Disposition: home with home health   Additional Comments:  06/30/16- 1500- Marvetta Gibbons RN, CM- pt s/p cardioversion 3/13- unsuccessful- orders have been placed for HHPT- spoke with pt and spouse at bedside- choice offered for [facility] agencies- per pt and spouse- they do not want to use [facility] inhouse therapy and want [facility] services- discussed which agencies work with [facility]- they have chosen to use [facility]- referral sent to Dian Situ with [facility] for [facility] [place]- pt is also on Eliquis- per pt she has been on Eliquis for several years and does not have any difficulty with cost. Pt remains on 02- may need home 02 at discharge- will need to see if pt qualifies under Medicare guidelines for home 02. CM to follow.   Dawayne Patricia, RN 06/30/2016, 3:29 PM

## 2016-06-30 NOTE — Progress Notes (Signed)
Per [employer] check for Eliquis Eliquis 5mg  BID PO is covered. No Prior Auth Required   $20 Copay

## 2016-06-30 NOTE — Progress Notes (Signed)
PROGRESS NOTE    [name]  LOV:[id number] DOB: July 15, 1930 DOA: 06/21/2016 PCP: Wyatt Haste, MD    Brief Narrative:  [name] is a 83 y.o. female with history of paroxysmal atrial fibrillation, hypertension, TIA presents to the ER because of persistent nausea vomiting with chest pain since last evening 6 PM. Patient states she was diagnosed with pneumonia last week and had completed a one-week course of Levaquin, last dose was yesterday. Last evening after having steak half an hour later patient started having nausea vomiting with left upper quadrant abdominal pain with chest pain radiating to the left shoulder. Patient had multiple episodes of vomiting. 2 days ago patient had mild diarrhea.   ED Course: In the ER EKG shows normal sinus rhythm with chest x-ray and KUB being unremarkable. Sonogram of the abdomen does not show any gallstones. Patient still persistently has nausea vomiting. Will be admitted for further observation. Patient was chest pain-free on admission.  Patient was admitted esophagram which was done was concerning for a gastric outlet obstruction versus small bowel obstruction. CT abdomen and pelvis was also done which did show large intrathoracic gastric hernia, no wall thickening or pneumatosis in the visualized stomach, findings most likely representing spontaneous interval reduction of a gastric volvulus. No evidence of gastric outlet obstruction or small bowel obstruction. General surgery was consulted and followed the patient during the hospitalization.  During the hospitalization patient subsequently went into A. fib with RVR as well as acute CHF and also noted to have a left basilar pneumonia. Patient transferred to cardiac telemetry floor placed on a Cardizem drip as well as IV Lasix and empiric IV Zosyn. Cardiac workup initiated. Cardiology consulted.2-D echo obtained had a EF of 50-55% with no wall motion abnormalities. Patient continued to feel poorly and  so nature fibrillation and a such patient to undergo DCCV today 06/29/2016 per cardiology.    Assessment & Plan:    #1 large hiatal show hernia probably would spontaneously reduced acute gastric volvulus As noted per esophagram and CT abdomen and pelvis. Patient with clinical improvement with no further nausea or vomiting. Patient tolerating soft diet. Continue PPI. Patient will likely need elective outpatient repair with further workup with EGD, possible manometry, cardiac clearance and eventual upper endoscopy hernia repair/possible fundoplication per general surgery recommendations. General surgery signed off, outpatient follow up  #2 aspiration pneumonia/volume overload Patient noted to be short of breath over the past 3-4 days. Patient noted to have a worsening leukocytosis with a white count of 15.3 on 06/25/2016 which is trending back down and fluctuating currently at 13.3 from 11.7 from 13.5. Patient had presented with nausea and vomiting secondary to large facial hernia and spontaneously reduced acute gastric volvulus and as such may have aspirated at that time.  Patient currently afebrile. Chest x-ray with worsening left basilar infiltrate. Patient also was nothing by mouth during initial part of the hospitalization and hydrated with IV fluids. Patient was approximately positive 4L on this admission. Urine Legionella antigen negative. Urine pneumococcus antigen negative. Sputum Gram stain and culture pending. Blood cultures no growth to date. MRSA screening nasal mucosa negative. Change IV Zosyn to oral Augmentin to complete course of antibiotic treatment. Continue Lasix 40 mg IV every 12 hours. Check chest x ray for pleural effusion  #3 acute diastolic CHF exacerbation Likely multifactorial secondary to A. fib with RVR and probable volume overload secondary to fluid resuscitation on admission. Patient with bibasilar crackles now which have improved from 06/26/2016. Patient states  shortness of breath improving. Patient was +4 L during this hospitalization. Patient with a urine output of 1.1L over the past 24 hours.Patient's current weight is 73.9 kg from74.8kg form 81.1 kg from 74.8 kg from 77.9 kg.  Initial set of cardiac enzymes was elevated however serial troponins have trended down. TSH elevated however free T4 and T3 within normal limits and likely euthyroid sick syndrome. Continue current dose of Lasix 40 mg IV every 12 hours. Strict I's and O's. Daily weights. 2-D echo ith EF of 50-55%, no wall motion abnormalities, grade 1 diastolic dysfunction, mildly dilated right atrium, moderate MR. Patient failed cardioversion Now NSR  #4 hypokalemia Secondary to diuretics. Repleted.   #5 paroxysmal atrial fibrillation with RVR Likely secondary to spontaneously reduce gastric volvulus and pneumonia. Patient transitioned from IV Lopressor to oral home dose Toprol for rate control. Patient noted to have elevated heart rates which was sustaining with heart rates in the 100s to the 130s. Patient currently on Cardizem drip. Initial set of cardiac enzymes was elevated however serial enzymes have trended down. TSH was elevated at 9.566, free T4 at 0.99, free T3 at 3.2. Likely euthyroid sick syndrome. Discontinued Synthroid. 2-D echo with EF of 50-55%, no wall motion abnormalities, grade 1 diastolic dysfunction, mildly dilated right atrium, moderate MR.  Patient has been transitioned from IV heparin back to home regimen of eliquis for anticoagulation. Patient failed cardioversion. Cardiology following and appreciate input and recommendations. Now on oral cardizem and metoprolol  #6 abnormal TSH/euthyroid sick syndrome TSH noted to be elevated at 9.566. Free T3 at 3.2. Free T4 0.99. Likely euthyroid sick syndrome. Discontinued IV Synthroid. Will likely need repeat thyroid function studies done in about 4-6 weeks.  #7 dehydration Resolved. Patient now volume overloaded. Saline lock IVF.    #8 chest pain Secondary to problem #1. Cardiac enzymes initially elevated however have trended down. 2-D echo with EF 50-55%, NWMA, grade 1 diastolic dysfunction. Moderate MR, mildy dilated RA. Resolved.  #9 hypertension Blood pressure currently stable. Currently on Cardizem drip. Patient's HCTZ and Toprol have been discontinued.   #10 history of TIA Stable. Continue anticoagulation for secondary stroke prevention.  #11 loose stools Change IV Zosyn to oral Augmentin. Imodium needed.   DVT prophylaxis: Eliquis Code Status: Full Family Communication: Updated patient and family at bedside. Disposition Plan: Remain in cardiac telemetry . Home when tolerating oral intake, continued bowel movements, improvement with shortness of breath and leukocytosis and afib and CHF and per cardiology. Hopefully in the next 1-2 days and per cardiology..   Consultants:   Gen. surgery: Dr.Tsuei 06/22/2016  Cardiology: Dr. Fransico Him 06/26/2016  Procedures:   Esophagram 06/22/2016  CT abdomen and pelvis 06/22/2016  Chest x-ray 06/22/2016, 06/25/2016  Abdominal x-ray 06/22/2016  2-D echo 06/28/2016  DCCV pending  Antimicrobials:   IV Zosyn 06/25/2016>>>>06/29/2016  Augmentin 06/29/2016   Subjective: Patient states no significant improvement with shortness of breath. Palpitations improving. Patient tolerating soft diet.  Patient passing flatus. Patient states has been having loose stools overnight.  Overnight heart rates have ranged from low 80s to low 100s on telemetry.  Objective: Vitals:   06/29/16 2353 06/30/16 0522 06/30/16 1230 06/30/16 1408  BP: (!) 108/50 (!) 111/57 112/71 (!) 89/82  Pulse: 73 73 (!) 121 (!) 105  Resp: 18 18  19   Temp: 98.2 F (36.8 C) 98.8 F (37.1 C)  98.6 F (37 C)  TempSrc: Oral Oral  Oral  SpO2: 98% 95% 98% 98%  Weight:  73.3 kg (  161 lb 11.2 oz)    Height:        Intake/Output Summary (Last 24 hours) at 06/30/16 1922 Last data filed at  06/30/16 1600  Gross per 24 hour  Intake              840 ml  Output                0 ml  Net              840 ml   Filed Weights   06/28/16 0300 06/29/16 0449 06/30/16 0522  Weight: 74.8 kg (165 lb) 73.9 kg (162 lb 14.7 oz) 73.3 kg (161 lb 11.2 oz)    Examination:  General exam: Appears calm and comfortable  Respiratory system: Bibasilar crackles L> R. Respiratory effort normal. Cardiovascular system: Irregularly irregular. No JVD, murmurs, rubs, gallops or clicks. Trace- BLE edema Gastrointestinal system: Abdomen is nondistended, soft and nontender. No organomegaly or masses felt. Normal bowel sounds heard. Central nervous system: Alert and oriented. No focal neurological deficits. Extremities: Symmetric 5 x 5 power. Skin: No rashes, lesions or ulcers Psychiatry: Judgement and insight appear normal. Mood & affect appropriate.     Data Reviewed: I have personally reviewed following labs and imaging studies  CBC:  Recent Labs Lab 06/26/16 0622 06/27/16 0140 06/28/16 0153 06/29/16 0233 06/30/16 0145  WBC 13.7* 13.5* 11.7* 13.3* 10.7*  HGB 12.2 11.1* 11.5* 11.9* 11.8*  HCT 37.3 34.5* 35.6* 36.7 35.8*  MCV 86.3 86.9 87.5 86.8 86.1  PLT 278 296 334 394 161*   Basic Metabolic Panel:  Recent Labs Lab 06/26/16 0622 06/27/16 0140 06/27/16 0839 06/28/16 0153 06/29/16 0233 06/30/16 0145  NA 139  --  137 137 136 136  K 3.2*  --  3.5 3.2* 4.5 3.2*  CL 102  --  102 98* 100* 100*  CO2 25  --  25 28 26 27   GLUCOSE 157*  --  189* 143* 142* 143*  BUN 9  --  13 15 16 20   CREATININE 0.91  --  1.12* 1.05* 1.12* 1.08*  CALCIUM 8.3*  --  8.0* 7.9* 8.4* 8.3*  MG 1.8 2.5*  --   --   --  2.1   GFR: Estimated Creatinine Clearance: 39.7 mL/min (by C-G formula based on SCr of 1.08 mg/dL (H)). Liver Function Tests: No results for input(s): AST, ALT, ALKPHOS, BILITOT, PROT, ALBUMIN in the last 168 hours. No results for input(s): LIPASE, AMYLASE in the last 168 hours. No results  for input(s): AMMONIA in the last 168 hours. Coagulation Profile: No results for input(s): INR, PROTIME in the last 168 hours. Cardiac Enzymes:  Recent Labs Lab 06/26/16 1511 06/26/16 1748 06/27/16 0140  TROPONINI 0.46* 0.03* <0.03   BNP (last 3 results) No results for input(s): PROBNP in the last 8760 hours. HbA1C: No results for input(s): HGBA1C in the last 72 hours. CBG:  Recent Labs Lab 06/28/16 1644 06/29/16 0119 06/29/16 0913 06/29/16 2351 06/30/16 1619  GLUCAP 209* 139* 135* 192* 133*   Lipid Profile: No results for input(s): CHOL, HDL, LDLCALC, TRIG, CHOLHDL, LDLDIRECT in the last 72 hours. Thyroid Function Tests: No results for input(s): TSH, T4TOTAL, FREET4, T3FREE, THYROIDAB in the last 72 hours. Anemia Panel: No results for input(s): VITAMINB12, FOLATE, FERRITIN, TIBC, IRON, RETICCTPCT in the last 72 hours. Sepsis Labs: No results for input(s): PROCALCITON, LATICACIDVEN in the last 168 hours.  Recent Results (from the past 240 hour(s))  Culture, Urine  Status: Abnormal   Collection Time: 06/22/16  8:23 AM  Result Value Ref Range Status   Specimen Description URINE, CLEAN CATCH  Final   Special Requests NONE  Final   Culture MULTIPLE SPECIES PRESENT, SUGGEST RECOLLECTION (A)  Final   Report Status 06/23/2016 FINAL  Final  MRSA PCR Screening     Status: None   Collection Time: 06/25/16 11:11 AM  Result Value Ref Range Status   MRSA by PCR NEGATIVE NEGATIVE Final    Comment:        The GeneXpert MRSA Assay (FDA approved for NASAL specimens only), is one component of a comprehensive MRSA colonization surveillance program. It is not intended to diagnose MRSA infection nor to guide or monitor treatment for MRSA infections.   Culture, blood (routine x 2) Call MD if unable to obtain prior to antibiotics being given     Status: None   Collection Time: 06/25/16  2:54 PM  Result Value Ref Range Status   Specimen Description BLOOD LEFT ANTECUBITAL   Final   Special Requests BOTTLES DRAWN AEROBIC AND ANAEROBIC 10CC  Final   Culture NO GROWTH 5 DAYS  Final   Report Status 06/30/2016 FINAL  Final  Culture, blood (routine x 2) Call MD if unable to obtain prior to antibiotics being given     Status: None   Collection Time: 06/25/16  2:54 PM  Result Value Ref Range Status   Specimen Description BLOOD LEFT HAND  Final   Special Requests BOTTLES DRAWN AEROBIC AND ANAEROBIC 4CC  Final   Culture NO GROWTH 5 DAYS  Final   Report Status 06/30/2016 FINAL  Final         Radiology Studies: Dg Chest 2 View  Result Date: 06/30/2016 CLINICAL DATA:  Shortness of breath and left-sided pleural effusion EXAM: CHEST  2 VIEW COMPARISON:  June 26, 2016 FINDINGS: There is a large hiatal type hernia. There is persistent airspace consolidation in the left lower lobe with fairly small left pleural effusion. There is a minimal right pleural effusion. Lungs elsewhere clear. Heart is slightly enlarged with pulmonary vascularity within normal limits. No adenopathy. There is atherosclerotic calcification in the aorta. There is degenerative change in the thoracic spine. IMPRESSION: Persistent airspace consolidation left lower lobe with small left pleural effusion. Minimal right pleural effusion. Lungs elsewhere clear. Stable cardiac prominence. Sizable hiatal hernia present. There is aortic atherosclerosis. Electronically Signed   By: Lowella Grip III M.D.   On: 06/30/2016 09:13        Scheduled Meds: . amoxicillin-clavulanate  1 tablet Oral Q12H  . apixaban  5 mg Oral BID  . diltiazem  30 mg Oral Q6H  . [START ON 07/01/2016] furosemide  40 mg Oral Daily  . metoprolol tartrate  25 mg Oral BID  . pantoprazole  40 mg Oral Q0600  . pravastatin  40 mg Oral Daily  . sodium chloride flush  3 mL Intravenous Q12H   Continuous Infusions: . sodium chloride       LOS: 8 days    Time spent: 40 minutes   Author:  Berle Mull, MD [facility] Pager: [phone] 06/30/2016 7:25 PM     If 7PM-7AM, please contact night-coverage [email or web address] Password [facility] 06/30/2016, 7:22 PM

## 2016-07-01 LAB — BASIC METABOLIC PANEL
Anion gap: 9 (ref 5–15)
BUN: 20 mg/dL (ref 6–20)
CHLORIDE: 104 mmol/L (ref 101–111)
CO2: 25 mmol/L (ref 22–32)
Calcium: 8.6 mg/dL — ABNORMAL LOW (ref 8.9–10.3)
Creatinine, Ser: 1.09 mg/dL — ABNORMAL HIGH (ref 0.44–1.00)
GFR, EST AFRICAN AMERICAN: 52 mL/min — AB (ref >60)
GFR, EST NON AFRICAN AMERICAN: 45 mL/min — AB (ref >60)
Glucose, Bld: 126 mg/dL — ABNORMAL HIGH (ref 65–99)
Potassium: 4.1 mmol/L (ref 3.5–5.1)
SODIUM: 138 mmol/L (ref 135–145)

## 2016-07-01 LAB — CBC
HCT: 35.5 % — ABNORMAL LOW (ref 36.0–46.0)
HEMOGLOBIN: 11.4 g/dL — AB (ref 12.0–15.0)
MCH: 27.9 pg (ref 26.0–34.0)
MCHC: 32.1 g/dL (ref 30.0–36.0)
MCV: 87 fL (ref 78.0–100.0)
Platelets: 380 10*3/uL (ref 150–400)
RBC: 4.08 MIL/uL (ref 3.87–5.11)
RDW: 14.8 % (ref 11.5–15.5)
WBC: 14.9 10*3/uL — ABNORMAL HIGH (ref 4.0–10.5)

## 2016-07-01 MED ORDER — METOPROLOL TARTRATE 25 MG PO TABS
25.0000 mg | ORAL_TABLET | Freq: Two times a day (BID) | ORAL | 0 refills | Status: DC
Start: 2016-07-01 — End: 2016-07-26

## 2016-07-01 MED ORDER — FUROSEMIDE 40 MG PO TABS: 40.0000 mg | ORAL_TABLET | Freq: Every day | ORAL | 0 refills | Status: DC

## 2016-07-01 MED ORDER — ENSURE ENLIVE PO LIQD: 237.0000 mL | Freq: Two times a day (BID) | ORAL | Status: DC

## 2016-07-01 MED ORDER — ENSURE ENLIVE PO LIQD: 237.0000 mL | Freq: Two times a day (BID) | ORAL | 12 refills | Status: DC

## 2016-07-01 MED ORDER — PANTOPRAZOLE SODIUM 40 MG PO TBEC: 40.0000 mg | DELAYED_RELEASE_TABLET | Freq: Every day | ORAL | 0 refills | Status: DC

## 2016-07-01 MED ORDER — SACCHAROMYCES BOULARDII 250 MG PO CAPS: 250.0000 mg | ORAL_CAPSULE | Freq: Two times a day (BID) | ORAL | 0 refills | Status: DC

## 2016-07-01 MED ORDER — DILTIAZEM HCL ER COATED BEADS 120 MG PO CP24: 120.0000 mg | ORAL_CAPSULE | Freq: Every day | ORAL | 0 refills | Status: DC

## 2016-07-01 MED ORDER — DILTIAZEM HCL ER COATED BEADS 120 MG PO CP24
120.0000 mg | ORAL_CAPSULE | Freq: Every day | ORAL | Status: DC
  Administered 2016-07-01: 120 mg via ORAL
  Filled 2016-07-01: qty 1

## 2016-07-01 MED ORDER — SACCHAROMYCES BOULARDII 250 MG PO CAPS: 250.0000 mg | ORAL_CAPSULE | Freq: Two times a day (BID) | ORAL | Status: DC

## 2016-07-01 NOTE — Discharge Summary (Signed)
[facility] Discharge Summary   Patient: [name] DUK:[id number]   PCP: Wyatt Haste, MD DOB: 1930/06/18   Date of admission: 06/21/2016   Date of discharge:  07/01/2016    Discharge Diagnoses:  Principal Problem:   Acute gastric volvulus Active Problems:   Essential hypertension, benign   Persistent atrial fibrillation (HCC)   Nausea & vomiting   Chest pain   Nausea and vomiting   Large hiatal hernia   PNA (pneumonia)   Hypervolemia   SOB (shortness of breath)   Acute diastolic CHF (congestive heart failure) (HCC)   Abnormal TSH   Elevated troponin   Viral upper respiratory tract infection   Hypokalemia   Admitted From: home Disposition:  Home with home health  Recommendations for Outpatient Follow-up:  1. Please follow up with cardiology, PCP and general surgery   Follow-up Information    Reyes Ivan, MD. Go on 07/01/2016.   Specialty:  General Surgery Why:  @10 :20 please arrive at 9:50AM to complete paperwork Contact information: [address] [place] [address] [phone]        [facility] Follow up.   Specialty:  Home Health Services Why:  HHPT arranged- please allow 24-48 hr post discharge for them to call and arrange home visits Contact information: [place] [address] [phone]        Wyatt Haste, MD. Schedule an appointment as soon as possible for a visit in 1 week(s).   Specialty:  Family Medicine Why:  get CBC rechecked.  Contact information: [place] [address] [phone]        Peter [name], MD. Schedule an appointment as soon as possible for a visit in 1 month(s).   Specialty:  Cardiology Contact information: [address] [place] [address] [phone]          Diet recommendation: cardiac diet  Activity: The patient is advised to gradually reintroduce usual activities.  Discharge  Condition: good  Code Status: full code  History of present illness: As per the H and P dictated on admission, "[name] is a 83 y.o. female with history of paroxysmal atrial fibrillation, hypertension, TIA versus to the ER because of persistent nausea vomiting with chest pain since last evening 6 PM. Patient states she was diagnosed with pneumonia last week and had completed a one-week course of Levaquin, last dose was yesterday. Last evening after having steak half an hour later patient started having nausea vomiting with left upper quadrant abdominal pain with chest pain radiating to the left shoulder. Patient had multiple episodes of vomiting. 2 days ago patient had mild diarrhea. "  Hospital Course:   Summary of her active problems in the hospital is as following. 1. large hiatal hernia spontaneously reduced acute gastric volvulus As noted per esophagram and CT abdomen and pelvis.  Patient with clinical improvement with no further nausea or vomiting. Patient tolerating diet. Continue PPI.  Patient will likely need elective outpatient repair with further workup with EGD, possible manometry, cardiac clearance and eventual upper endoscopy hernia repair/possible fundoplication per general surgery recommendations. General surgery signed off, outpatient follow up in 1 month. Now with DCCV attempt pt will need uninterrupted anticoagulation for next 6 weeks  2 aspiration pneumonia Acute on chronic diastolic CHF Acute respiratory failure with hypoxia leukocytosis  spontaneously reduced acute gastric volvulus and as such may have aspirated at that time.  Patient currently afebrile.  Initial  Chest x-ray with worsening left basilar infiltrate. Repeat x ray is stable Urine Legionella antigen negative. Urine pneumococcus antigen negative. Blood cultures no growth to date. MRSA screening nasal mucosa negative.  Was on IV Zosyn changed to oral Augmentin, completed course of   Chronic Diastolic  dysfunction with acute worsening Likely multifactorial secondary to A. fib with RVR and probable volume overload secondary to fluid resuscitation on admission.  Elevated serial troponins now trended down. TSH elevated however free T4 and T3 within normal limits and likely euthyroid sick syndrome. Was given IV Lasix 40 mg IV every 12 hours. 2-D echo ith EF of 50-55%, no wall motion abnormalities, grade 1 diastolic dysfunction, mildly dilated right atrium, moderate MR.  Patient failed cardioversion, Now NSR  Need 3 LPM Dawson at the time of discharge.   4 hypokalemia Secondary to diuretics. Repleted.   5 paroxysmal atrial fibrillation with RVR Patient transitioned from IV Lopressor to oral home dose Toprol for rate control.  Was also on Cardizem drip. Cardiology consulted. Was initially on IV heparin now on home regimen of eliquis for anticoagulation. Patient failed cardioversion.  Cardiology following and appreciate input and recommendations. Now on oral cardizem and metoprolol  6 abnormal TSH/euthyroid sick syndrome TSH noted to be elevated at 9.566. Free T3 at 3.2. Free T4 0.99. Likely euthyroid sick syndrome. need repeat thyroid function studies done in about 4-6 weeks.  7 dehydration Resolved.   8 chest pain Secondary to problem #1. Cardiac enzymes initially elevated however have trended down. 2-D echo with EF 50-55%, NWMA, grade 1 diastolic dysfunction. Moderate MR, mildy dilated RA. Resolved.  9 hypertension Blood pressure currently stable. Currently on Cardizem drip. Patient's HCTZ and Toprol have been discontinued.   10 history of TIA Stable. Continue anticoagulation for secondary stroke prevention.  11 loose stools Antibiotics induced, no abdominal pain or distension or other complains concerning for C diff, on probiotics, Antibiotics completed.   All other chronic medical condition were stable during the hospitalization.  Patient was seen by physical therapy, who  recommended home health, which was arranged by Education officer, museum and case Freight forwarder. On the day of the discharge the patient's vitals were stable, and no other acute medical condition were reported by patient. the patient was felt safe to be discharge at home health with therapy.  Procedures and Results:  Esophagram 06/22/2016  CT abdomen and pelvis 06/22/2016  Chest x-ray 06/22/2016, 06/25/2016  Abdominal x-ray 06/22/2016  2-D echo 06/28/2016  DCCV attempt  Consultations:  Gen. surgery: Dr.Tsuei 06/22/2016  Cardiology: Dr. Fransico Him 06/26/2016  DISCHARGE MEDICATION: Current Discharge Medication List    START taking these medications   Details  diltiazem (CARDIZEM CD) 120 MG 24 hr capsule Take 1 capsule (120 mg total) by mouth daily. Qty: 30 capsule, Refills: 0    feeding supplement, ENSURE ENLIVE, (ENSURE ENLIVE) LIQD Take 237 mLs by mouth 2 (two) times daily between meals. Qty: 237 mL, Refills: 12    furosemide (LASIX) 40 MG tablet Take 1 tablet (40 mg total) by mouth daily. Qty: 30 tablet, Refills: 0    metoprolol tartrate (LOPRESSOR) 25 MG tablet Take 1 tablet (25 mg total) by mouth 2 (two) times daily. Qty: 60 tablet, Refills: 0    pantoprazole (PROTONIX) 40 MG tablet Take 1 tablet (40 mg total) by mouth daily at 6 (six) AM. Qty: 30 tablet, Refills: 0    saccharomyces boulardii (FLORASTOR) 250 MG capsule Take 1 capsule (250 mg total) by mouth 2 (two) times daily. Qty:  20 capsule, Refills: 0      CONTINUE these medications which have NOT CHANGED   Details  azelastine (ASTELIN) 137 MCG/SPRAY nasal spray Place 1 spray into the nose 2 (two) times daily. Use in each nostril as directed Qty: 30 mL, Refills: 11   Associated Diagnoses: Allergic rhinitis, mild    benzonatate (TESSALON) 100 MG capsule Take 2 capsules (200 mg total) by mouth 3 (three) times daily as needed for cough. Qty: 30 capsule, Refills: 0   Associated Diagnoses: Pneumonia of left lower lobe due to  infectious organism ([place])    CALCIUM CARBONATE-VIT D-MIN PO Take 2 tablets by mouth daily.     CRANBERRY EXTRACT PO Take 1 capsule by mouth daily.     ELIQUIS 5 MG TABS tablet take 1 tablet by mouth twice a day Qty: 60 tablet, Refills: 1    glucosamine-chondroitin 500-400 MG tablet Take 1 tablet by mouth 3 (three) times daily.      loratadine (CLARITIN) 10 MG tablet Take 5 mg by mouth daily as needed for allergies.    Polyethyl Glycol-Propyl Glycol (SYSTANE OP) Place 1 drop into both eyes daily as needed (for dry eyes).    pravastatin (PRAVACHOL) 40 MG tablet take 1 tablet by mouth once daily Qty: 30 tablet, Refills: 11   Associated Diagnoses: Hyperlipidemia with target LDL less than 130      STOP taking these medications     hydrochlorothiazide (MICROZIDE) 12.5 MG capsule      metoprolol succinate (TOPROL-XL) 50 MG 24 hr tablet        Allergies  Allergen Reactions  . Lisinopril Other (See Comments)    DIZZY   Discharge Instructions    Diet - low sodium heart healthy    Complete by:  As directed    Increase activity slowly    Complete by:  As directed      Discharge Exam: Filed Weights   06/29/16 0449 06/30/16 0522 07/01/16 0626  Weight: 73.9 kg (162 lb 14.7 oz) 73.3 kg (161 lb 11.2 oz) 73 kg (161 lb)   Vitals:   07/01/16 1513 07/01/16 1626  BP: (!) 94/44 (!) 106/56  Pulse: 65   Resp: 18   Temp:     General: Appear in mild distress, no Rash; Oral Mucosa moist Cardiovascular: S1 and S2 Present, no Murmur, no JVD Respiratory: Bilateral Air entry present and Clear to Auscultation, fine basal Crackles, no wheezes Abdomen: Bowel Sound present, Soft and no tenderness Extremities: no Pedal edema, no calf tenderness Neurology: Grossly no focal neuro deficit.  The results of significant diagnostics from this hospitalization (including imaging, microbiology, ancillary and laboratory) are listed below for reference.    Significant Diagnostic Studies: Dg Chest 2  View  Result Date: 06/30/2016 CLINICAL DATA:  Shortness of breath and left-sided pleural effusion EXAM: CHEST  2 VIEW COMPARISON:  June 26, 2016 FINDINGS: There is a large hiatal type hernia. There is persistent airspace consolidation in the left lower lobe with fairly small left pleural effusion. There is a minimal right pleural effusion. Lungs elsewhere clear. Heart is slightly enlarged with pulmonary vascularity within normal limits. No adenopathy. There is atherosclerotic calcification in the aorta. There is degenerative change in the thoracic spine. IMPRESSION: Persistent airspace consolidation left lower lobe with small left pleural effusion. Minimal right pleural effusion. Lungs elsewhere clear. Stable cardiac prominence. Sizable hiatal hernia present. There is aortic atherosclerosis. Electronically Signed   By: Lowella Grip III M.D.   On: 06/30/2016 09:13  Dg Chest 2 View  Result Date: 06/25/2016 CLINICAL DATA:  Shortness of breath and weakness EXAM: CHEST  2 VIEW COMPARISON:  06/21/2016 FINDINGS: Cardiac shadow is enlarged. A large hiatal hernia is again seen and stable. Increasing left basilar infiltrate and effusion is seen compared with the prior exam. No acute bony abnormality is noted. IMPRESSION: Increasing left basilar infiltrate. Electronically Signed   By: Inez Catalina M.D.   On: 06/25/2016 10:53   Dg Chest 2 View  Result Date: 06/21/2016 CLINICAL DATA:  Cough and vomiting EXAM: CHEST  2 VIEW COMPARISON:  None. FINDINGS: Low lung volumes. Streaky atelectasis left lung base. No effusion. Mild cardiomegaly with atherosclerosis. Moderate to large hiatal hernia. No pneumothorax. IMPRESSION: 1. Streaky atelectasis at the left lung base 2. Moderate to large hiatal hernia Electronically Signed   By: Donavan Foil M.D.   On: 06/21/2016 23:59   Dg Abd 1 View  Result Date: 06/22/2016 CLINICAL DATA:  All nausea and vomiting after a steak dinner tonight. Chest pain. EXAM: ABDOMEN - 1 VIEW  COMPARISON:  None. FINDINGS: Stool and air throughout the colon. No radiographic evidence of bowel obstruction or perforation. No biliary or urinary calculi are evident. Marked curvature and degenerative change of the lumbar spine. IMPRESSION: Negative for obstruction or perforation. Electronically Signed   By: Andreas Newport M.D.   On: 06/22/2016 03:47   US Abdomen Complete  Result Date: 06/22/2016 CLINICAL DATA:  Postprandial chest pain last night EXAM: ABDOMEN ULTRASOUND COMPLETE COMPARISON:  None. FINDINGS: Gallbladder: No gallstones or wall thickening visualized. No sonographic Murphy sign noted by sonographer. Common bile duct: Diameter: 4 mm Liver: Diffuse parenchymal echogenicity without focal lesion. IVC: No abnormality visualized. Pancreas: Visualized portion unremarkable. Spleen: Size and appearance within normal limits. Right Kidney: Length: 10.4 cm. Echogenicity within normal limits. No mass or hydronephrosis visualized. Left Kidney: Length: 11.7 cm. Echogenicity within normal limits. No mass or hydronephrosis visualized. Abdominal aorta: Limited visibility. Visible portions are normal in caliber. Other findings: None. IMPRESSION: Study limited due to bowel gas and hepatic echogenicity. The diffuse hepatic echogenicity likely represents fatty infiltration. Normal gallbladder and bile ducts. Electronically Signed   By: Andreas Newport M.D.   On: 06/22/2016 06:06   Ct Abdomen Pelvis W Contrast  Result Date: 06/22/2016 CLINICAL DATA:  Vomiting for 2 days. Abdominal pain. Clinical concern for gastric outlet obstruction on upper GI from earlier today. EXAM: CT ABDOMEN AND PELVIS WITH CONTRAST TECHNIQUE: Multidetector CT imaging of the abdomen and pelvis was performed using the standard protocol following bolus administration of intravenous contrast. CONTRAST:  100 cc ISOVUE-300 IOPAMIDOL (ISOVUE-300) INJECTION 61% COMPARISON:  Upper GI and abdominal radiograph from earlier today. FINDINGS:  Visualization of the upper abdomen is limited by streak artifact from barium in the stomach. Lower chest: Trace bilateral dependent pleural effusions. Compressive atelectasis in the medial and dependent lower lobes bilaterally. Hepatobiliary: Diffuse hepatic steatosis. Normal liver size. No liver mass. Normal gallbladder with no radiopaque cholelithiasis. No biliary ductal dilatation. Pancreas: Normal, with no mass or duct dilation. Spleen: Normal size. No mass. Adrenals/Urinary Tract: Normal adrenals. No hydronephrosis. Mild segmental renal cortical scarring in the posterior upper right kidney. No renal mass. Normal bladder. Stomach/Bowel: There is a large intrathoracic gastric hernia, which is only partially visualized on this CT abdomen study. There is retained barium contrast and food debris in the mildly distended visualized intrathoracic portion of the stomach. No gastric wall thickening or pneumatosis is seen in the visualized stomach. The distal intra- abdominal  portion of the stomach is collapsed and grossly normal. The duodenum is relatively collapsed and normal. Normal caliber small bowel with no small bowel wall thickening. Oral contrast progresses to the colon. Normal appendix. Marked sigmoid diverticulosis, with no large bowel wall thickening or pericolonic fat stranding. Vascular/Lymphatic: Atherosclerotic nonaneurysmal abdominal aorta. Patent portal, splenic, hepatic and renal veins. No pathologically enlarged lymph nodes in the abdomen or pelvis. Reproductive: Status post hysterectomy, with no abnormal findings at the vaginal cuff. No adnexal mass. Other: No pneumoperitoneum, ascites or focal fluid collection. Musculoskeletal: No aggressive appearing focal osseous lesions. Marked thoracolumbar spondylosis with mild to moderate levocurvature of the thoracolumbar spine . IMPRESSION: 1. Large intrathoracic gastric hernia, only partially visualized on this CT abdomen study. Intrathoracic stomach is only  mildly distended and barium contrast progresses to the colon. No wall thickening or pneumatosis in the visualized stomach. Findings most likely represent spontaneous interval reduction of a gastric volvulus. No evidence of gastric outlet obstruction or small bowel obstruction on this CT study. A follow-up upper GI could be obtained on a short-term basis as clinically warranted. 2. Trace bilateral dependent pleural effusions with bibasilar atelectasis . 3. Additional findings include aortic atherosclerosis, diffuse hepatic steatosis and marked sigmoid diverticulosis. Electronically Signed   By: Ilona Sorrel M.D.   On: 06/22/2016 14:23   Dg Esophagus  Result Date: 06/22/2016 CLINICAL DATA:  Persistent vomiting. Impaction? UNABLE TO SWALLOW ANYTHING SINCE YESTERDAY VOMITING, REGURITATION EXAM: ESOPHOGRAM/BARIUM SWALLOW TECHNIQUE: Single contrast examination was performed using thin barium or water soluble. FLUOROSCOPY TIME:  Fluoroscopy Time:  3 minutes and 6 seconds Number of Acquired Spot Images: 2 COMPARISON:  Chest x-ray from earlier same day. FINDINGS: Barium contrast material was administered orally during fluoroscopic examination. There is prompt flow of the contrast to the level of the distal esophagus. There is a very delayed flow of the contrast from the distal esophagus to the stomach. The stomach appears distended with ingested materials blocking flow of the contrast material beyond the stomach cardia. Findings on today's exam, combined with the appearance on earlier chest x-ray, raises the possibility of large paraesophageal hiatal hernia or gastric volvulus. IMPRESSION: Contrast is obstructed at the level of the stomach cardia. There is an associated delay in the passage of contrast from the lower esophagus to the stomach. Stomach appears to be nearly completely field with ingested materials blocking the flow of contrast. Findings suggest either proximal small bowel obstruction or gastric outlet  obstruction. Findings on today's exam, combined with the appearance on earlier chest x-ray, also raise the possibility of paraesophageal hiatal hernia or gastric volvulus. Recommend CT abdomen and pelvis for further characterization. These results were called by telephone at the time of interpretation on 06/22/2016 at 9:14 am to Dr. Grandville Silos, who verbally acknowledged these results. Electronically Signed   By: Franki Cabot M.D.   On: 06/22/2016 09:15   Dg Chest Port 1 View  Result Date: 06/26/2016 CLINICAL DATA:  Shortness of breath. Nausea, vomiting and chest pain. EXAM: PORTABLE CHEST 1 VIEW COMPARISON:  06/25/2016 and 06/21/2016 radiographs FINDINGS: Cardiomegaly, large hiatal hernia and pulmonary vascular congestion again noted. Left lower lung atelectasis/consolidation appear slightly increased. There may be a left pleural effusion present. There is no evidence of pneumothorax. Mild right basilar atelectasis again identified. IMPRESSION: Slightly increased left lower lung consolidation/atelectasis with possible left pleural effusion. Cardiomegaly and large hiatal hernia again noted. Electronically Signed   By: Margarette Canada M.D.   On: 06/26/2016 20:09    Microbiology: Recent  Results (from the past 240 hour(s))  Culture, Urine     Status: Abnormal   Collection Time: 06/22/16  8:23 AM  Result Value Ref Range Status   Specimen Description URINE, CLEAN CATCH  Final   Special Requests NONE  Final   Culture MULTIPLE SPECIES PRESENT, SUGGEST RECOLLECTION (A)  Final   Report Status 06/23/2016 FINAL  Final  MRSA PCR Screening     Status: None   Collection Time: 06/25/16 11:11 AM  Result Value Ref Range Status   MRSA by PCR NEGATIVE NEGATIVE Final    Comment:        The GeneXpert MRSA Assay (FDA approved for NASAL specimens only), is one component of a comprehensive MRSA colonization surveillance program. It is not intended to diagnose MRSA infection nor to guide or monitor treatment for MRSA  infections.   Culture, blood (routine x 2) Call MD if unable to obtain prior to antibiotics being given     Status: None   Collection Time: 06/25/16  2:54 PM  Result Value Ref Range Status   Specimen Description BLOOD LEFT ANTECUBITAL  Final   Special Requests BOTTLES DRAWN AEROBIC AND ANAEROBIC 10CC  Final   Culture NO GROWTH 5 DAYS  Final   Report Status 06/30/2016 FINAL  Final  Culture, blood (routine x 2) Call MD if unable to obtain prior to antibiotics being given     Status: None   Collection Time: 06/25/16  2:54 PM  Result Value Ref Range Status   Specimen Description BLOOD LEFT HAND  Final   Special Requests BOTTLES DRAWN AEROBIC AND ANAEROBIC 4CC  Final   Culture NO GROWTH 5 DAYS  Final   Report Status 06/30/2016 FINAL  Final     Labs: CBC:  Recent Labs Lab 06/27/16 0140 06/28/16 0153 06/29/16 0233 06/30/16 0145 07/01/16 0235  WBC 13.5* 11.7* 13.3* 10.7* 14.9*  HGB 11.1* 11.5* 11.9* 11.8* 11.4*  HCT 34.5* 35.6* 36.7 35.8* 35.5*  MCV 86.9 87.5 86.8 86.1 87.0  PLT 296 334 394 402* 622   Basic Metabolic Panel:  Recent Labs Lab 06/26/16 0622 06/27/16 0140 06/27/16 0839 06/28/16 0153 06/29/16 0233 06/30/16 0145 07/01/16 0235  NA 139  --  137 137 136 136 138  K 3.2*  --  3.5 3.2* 4.5 3.2* 4.1  CL 102  --  102 98* 100* 100* 104  CO2 25  --  25 28 26 27 25   GLUCOSE 157*  --  189* 143* 142* 143* 126*  BUN 9  --  13 15 16 20 20   CREATININE 0.91  --  1.12* 1.05* 1.12* 1.08* 1.09*  CALCIUM 8.3*  --  8.0* 7.9* 8.4* 8.3* 8.6*  MG 1.8 2.5*  --   --   --  2.1  --    Liver Function Tests: No results for input(s): AST, ALT, ALKPHOS, BILITOT, PROT, ALBUMIN in the last 168 hours. No results for input(s): LIPASE, AMYLASE in the last 168 hours. No results for input(s): AMMONIA in the last 168 hours. Cardiac Enzymes:  Recent Labs Lab 06/26/16 1511 06/26/16 1748 06/27/16 0140  TROPONINI 0.46* 0.03* <0.03   BNP (last 3 results)  Recent Labs  06/26/16 1748  06/27/16 0839 06/28/16 0153  BNP 836.8* 492.1* 416.4*   CBG:  Recent Labs Lab 06/29/16 0119 06/29/16 0913 06/29/16 2351 06/30/16 1619 06/30/16 2351  GLUCAP 139* 135* 192* 133* 109*   Time spent: 30 minutes  Signed:  Seng Larch  [facility]  07/01/2016  ,  4:36 PM

## 2016-07-01 NOTE — Progress Notes (Signed)
07/01/16   20:15 Returned call to husband who questioned how and when to get in touch with [facility].  After speaking to husband, he states he has not been able to get in touch with [facility] for oxygen. He states that he is waiting for someone from [facility] to return his call. Payton Emerald, RN

## 2016-07-01 NOTE — Progress Notes (Signed)
Initial Nutrition Assessment  DOCUMENTATION CODES:   Not applicable  INTERVENTION:  Ensure Enlive BID between meals. Each supplement provides 350 kcal & 20 grams of protein.  NUTRITION DIAGNOSIS:   Inadequate oral intake related to acute illness as evidenced by estimated needs.   GOAL:   Patient will meet greater than or equal to 90% of their needs   MONITOR:   Supplement acceptance, I & O's, Labs, Weight trends, PO intake  REASON FOR ASSESSMENT:   Malnutrition Screening Tool    ASSESSMENT:   83 y.o. female with history of paroxysmal atrial fibrillation, hypertension, TIA presented to the ER because of persistent nausea vomiting with chest pain. Patient states she was diagnosed with pneumonia last week and had completed a one-week course of Levaquin, last dose was yesterday. Last evening after having steak half an hour later patient started having nausea vomiting with left upper quadrant abdominal pain with chest pain radiating to the left shoulder. Patient had multiple episodes of vomiting. 2 days ago patient had mild diarrhea.   Patient reported appetite has been decreased for 4-5 days PTA due to diagnosis of pneumonia. Patient reported nausea and vomiting also occurred for 2 days PTA. Patient reported no current nausea or vomiting. Patient also reported she consumed 100% of breakfast and that her appetite was much better today.  Patient from independent living facility and reports food preparation is performed by staff at the facility. Patient reported meals are very good. Patient reported UBW is 160 lb , but patient weighed about 171 at beginning of admission. Patient reported she was fluid overloaded and now patient weight is down to 161 after fluid removal.  Patient reported she takes Calcium supplements at home, but had never tried Boost or Ensure. Patient agreed to drink Ensure BID to supplement current diet with extra protein and calories.  Nutrition focused physical  exam performed. Findings were no muscle or fat depletion. Mild edema present in upper and lower extremities.   Labs Reviewed: Glucose 126 (H), Creatinine 1.09 (H), Calcium 8.6 (L)  Meds Reviewed: Lopressor, Lasix, Pravastatin   Diet Order:  DIET SOFT Room service appropriate? Yes; Fluid consistency: Thin  Skin:  Reviewed, no issues  Last BM:  3/14  Height:   Ht Readings from Last 1 Encounters:  06/21/16 5\' 6"  (1.676 m)    Weight:   Wt Readings from Last 1 Encounters:  07/01/16 161 lb (73 kg)    Ideal Body Weight:  59 kg  BMI:  Body mass index is 25.99 kg/m.  Estimated Nutritional Needs:   Kcal:  1400-1600  Protein:  73-83 grams  Fluid:  >/=1.5 L  EDUCATION NEEDS:   No education needs identified at this time  Juliann Pulse M.S. Nutrition Dietetic Intern

## 2016-07-01 NOTE — Care Management Important Message (Signed)
Important Message  Patient Details  Name: [name] MRN: [id number] Date of Birth: 12/13/1930   Medicare Important Message Given:  Yes    [name] May 07/01/2016, 12:54 PM

## 2016-07-01 NOTE — Progress Notes (Signed)
Patient Name: [name] Date of Encounter: 07/01/2016  Primary Cardiologist: Dr. [name]   Hospital Problem List     Principal Problem:   Acute gastric volvulus Active Problems:   Essential hypertension, benign   Persistent atrial fibrillation (HCC)   Nausea & vomiting   Chest pain   Nausea and vomiting   Large hiatal hernia   PNA (pneumonia)   Hypervolemia   SOB (shortness of breath)   Acute diastolic CHF (congestive heart failure) (HCC)   Abnormal TSH   Elevated troponin   Viral upper respiratory tract infection   Hypokalemia     Subjective   Feeling better in terms of breathing. Still with some cough. Now has some diarrhea.   Inpatient Medications    Scheduled Meds: . amoxicillin-clavulanate  1 tablet Oral Q12H  . apixaban  5 mg Oral BID  . diltiazem  120 mg Oral Daily  . feeding supplement (ENSURE ENLIVE)  237 mL Oral BID BM  . furosemide  40 mg Oral Daily  . metoprolol tartrate  25 mg Oral BID  . pantoprazole  40 mg Oral Q0600  . pravastatin  40 mg Oral Daily  . sodium chloride flush  3 mL Intravenous Q12H   Continuous Infusions: . sodium chloride     PRN Meds: acetaminophen **OR** acetaminophen, guaiFENesin, loratadine, LORazepam, menthol-cetylpyridinium, ondansetron **OR** ondansetron (ZOFRAN) IV, prochlorperazine, sodium chloride flush, traMADol   Vital Signs    Vitals:   06/30/16 1230 06/30/16 1408 06/30/16 2117 07/01/16 0626  BP: 112/71 (!) 89/82 (!) 107/56 132/63  Pulse: (!) 121 (!) 105 69 84  Resp:  19    Temp:  98.6 F (37 C) 98.7 F (37.1 C) 98.7 F (37.1 C)  TempSrc:  Oral Oral Oral  SpO2: 98% 98% 95% 96%  Weight:    161 lb (73 kg)  Height:        Intake/Output Summary (Last 24 hours) at 07/01/16 1201 Last data filed at 06/30/16 2300  Gross per 24 hour  Intake              600 ml  Output              150 ml  Net              450 ml   Filed Weights   06/29/16 0449 06/30/16 0522 07/01/16 0626  Weight: 162 lb 14.7 oz (73.9  kg) 161 lb 11.2 oz (73.3 kg) 161 lb (73 kg)    Physical Exam   GEN: Well nourished, well developed, in no acute distress.  HEENT: Grossly normal.  Neck: Supple, no JVD, carotid bruits, or masses. Cardiac: RRR, no murmurs, rubs, or gallops. No clubbing, cyanosis, edema.  Radials/DP/PT 2+ and equal bilaterally.  Respiratory:  Respirations regular and unlabored, clear to auscultation bilaterally. GI: Soft, nontender, nondistended, BS + x 4. MS: no deformity or atrophy. Skin: warm and dry, no rash. Neuro:  Strength and sensation are intact. Psych: AAOx3.  Normal affect.  Labs    CBC  Recent Labs  06/30/16 0145 07/01/16 0235  WBC 10.7* 14.9*  HGB 11.8* 11.4*  HCT 35.8* 35.5*  MCV 86.1 87.0  PLT 402* 850   Basic Metabolic Panel  Recent Labs  06/30/16 0145 07/01/16 0235  NA 136 138  K 3.2* 4.1  CL 100* 104  CO2 27 25  GLUCOSE 143* 126*  BUN 20 20  CREATININE 1.08* 1.09*  CALCIUM 8.3* 8.6*  MG 2.1  --  Liver Function Tests No results for input(s): AST, ALT, ALKPHOS, BILITOT, PROT, ALBUMIN in the last 72 hours. No results for input(s): LIPASE, AMYLASE in the last 72 hours. Cardiac Enzymes No results for input(s): CKTOTAL, CKMB, CKMBINDEX, TROPONINI in the last 72 hours. BNP Invalid input(s): POCBNP D-Dimer No results for input(s): DDIMER in the last 72 hours. Hemoglobin A1C No results for input(s): HGBA1C in the last 72 hours. Fasting Lipid Panel No results for input(s): CHOL, HDL, LDLCALC, TRIG, CHOLHDL, LDLDIRECT in the last 72 hours. Thyroid Function Tests No results for input(s): TSH, T4TOTAL, T3FREE, THYROIDAB in the last 72 hours.  Invalid input(s): FREET3  Telemetry    Currently in NSR with some PVCs - Personally Reviewed  ECG    06/26/16 ECG: afib with RVR - Personally Reviewed  Radiology    Dg Chest 2 View  Result Date: 06/30/2016 CLINICAL DATA:  Shortness of breath and left-sided pleural effusion EXAM: CHEST  2 VIEW COMPARISON:  June 26, 2016 FINDINGS: There is a large hiatal type hernia. There is persistent airspace consolidation in the left lower lobe with fairly small left pleural effusion. There is a minimal right pleural effusion. Lungs elsewhere clear. Heart is slightly enlarged with pulmonary vascularity within normal limits. No adenopathy. There is atherosclerotic calcification in the aorta. There is degenerative change in the thoracic spine. IMPRESSION: Persistent airspace consolidation left lower lobe with small left pleural effusion. Minimal right pleural effusion. Lungs elsewhere clear. Stable cardiac prominence. Sizable hiatal hernia present. There is aortic atherosclerosis. Electronically Signed   By: Lowella Grip III M.D.   On: 06/30/2016 09:13    Cardiac Studies   2D Echo this admission: Study Conclusions - Left ventricle: The cavity size was normal. Wall thickness was increased in a pattern of mild LVH. Systolic function was normal. The estimated ejection fraction was in the range of 50% to 55%. Wall motion was normal; there were no regional wall motion abnormalities. Doppler parameters are consistent with abnormal left ventricular relaxation (grade 1 diastolic dysfunction). - Mitral valve: Moderately calcified annulus. There was moderate regurgitation. - Left atrium: The atrium was mildly dilated. - Right atrium: The atrium was mildly dilated.  Patient Profile     83 y.o.femalewith PAF (CHADSVASC 6), HTN, TIA, hiatal hernia admitted 06/20/16 with n/v/LUQ pain with chest pain radiating to left shoulder with esophogram concerning for gastric outlet obstruction versus small bowel obstruction. CT abdomen and pelvis showedlarge intrathoracic gastric hernia and feltfindings most likely representedspontaneous interval reduction of a gastric volvulus. She's been followed conservatively for this. On 06/25/16 she developed SOB with leukocytosis and CXR concerning for aspiration PNA. She also got IVF  hydration so there was concern for acute diastolic CHF. She was also found to be hypothyroid. Hospital stay complicated further by elevated troponin and persistent atrial fib with RVR. 2D echo 06/28/16: mild LVH, EF 50-55%, grade 1 DD, mod MR, mild LAE/RAE.  Assessment & Plan    1. Acute diastolic CHF: suspect related to IV fluids and atrial fib. Weight is now below prior OP weights. Currently 161 lbs. Appears euvolemic. IV lasix converted to PO lasix 40mg  daily.   2. Elevated troponin: initial trop elevated at 0.46 downtrending to 0.03.This has been felt related to acute CHF and PNA and is flat trend. She has no history of CAD in the past. Echo showed no wall motion abnormality. Can reassess for angina as OP given atherosclerotic aorta seen on CT abd, giving her time to recover from acute illness. She's  not had any chest pain.  3. Paroxysmal atrial fibrillation: she's been on long term anticoagulation prior to admission with Eliquis 5mg  BID. Initially PAF this admission then persistent. DCCV 06/29/16 with 3 shocks was unsuccessful but she actually converted on her own late on 06/29/16, returned to afib but now back in NSR. Per patient, she has a long history of PAF and takes PRN Lopressor as needed if HRs become elevated. Currently on Cardizem CD 120mg  daily and Lopressor 25mg  BID. HR in 60s in sinus   4. Hypokalemia: repleted  5. Large hiatal herniawith probable spontaneous reduced gastric vovulus, with diarrhea: per IM.  6. Aspiration PN: per IM. CXR yesterday showed small persistent bilateral pleural effusions and resolving pneumonia. However, white count initially improved but now up to 14.9. Per IM.    Signed, Angelena Form, PA-C  07/01/2016, 12:01 PM

## 2016-07-01 NOTE — Progress Notes (Signed)
SATURATION QUALIFICATIONS: (This note is used to comply with regulatory documentation for home oxygen)  Patient Saturations on Room Air at Rest = 95%  Patient Saturations on Room Air while Ambulating = 86%  Patient Saturations on 3 Liters of oxygen while Ambulating = 95%  Please briefly explain why patient needs home oxygen:

## 2016-07-01 NOTE — Care Management Note (Signed)
Case Management Note Previous CM note initiated by Sharin Mons, RN 06/22/2016, 4:20 PM    Patient Details  Name: [name] MRN: [id number] Date of Birth: 05/29/1930  Subjective/Objective:    Patient presents with N/V, chest pain, hx of paroxysmal atrial fibrillation, hypertension, TIA . From [place] facility with husband. Independent with ADL's , no DME usage PTA.  [name] (Spouse)      [phone]      PCP: Jill Alexanders  Action/Plan: Plan is to d/c to home when medically stable. CM to f/u with disposition needs.  Expected Discharge Date:  07/01/16               Expected Discharge Plan:  [place]  In-House Referral:     Discharge planning Services  CM Consult  Post Acute Care Choice:  Home Health, Durable Medical Equipment Choice offered to:  Patient  DME Arranged:  Oxygen DME Agency:  [place] Arranged:  PT Hydro:  [place]  Status of Service:  Completed, signed off  If discussed at [place] of Stay Meetings, dates discussed:  3/13  Discharge Disposition: home with home health   Additional Comments:  07/01/16- 1630- Marvetta Gibbons RN, CM- pt for d/c home today- will need home 02- order has been placed- spoke with Leroy Sea from [facility] regarding DME need- portable tank to be delivered to room prior to discharge- have spoken with pt and spouse at bedside to clarify d/c arrangements. Dian Situ with [facility] aware of d/c.   06/30/16- 1500- Marvetta Gibbons RN, CM- pt s/p cardioversion 3/13- unsuccessful- orders have been placed for HHPT- spoke with pt and spouse at bedside- choice offered for [facility] agencies- per pt and spouse- they do not want to use [facility] inhouse therapy and want [facility] services- discussed which agencies work with [facility]- they have chosen to use [facility]- referral sent to Dian Situ with [facility] for [place]- pt is also on Eliquis- per pt she has been on Eliquis for several  years and does not have any difficulty with cost. Pt remains on 02- may need home 02 at discharge- will need to see if pt qualifies under Medicare guidelines for home 02. CM to follow.   Dawayne Patricia, RN 07/01/2016, 4:52 PM Case Management Note  Patient Details  Name: [name] MRN: [id number] Date of Birth: 1930/07/14  Subjective/Objective:                    Action/Plan:   Expected Discharge Date:  07/01/16               Expected Discharge Plan:  [place]  In-House Referral:     Discharge planning Services  CM Consult  Post Acute Care Choice:  Home Health, Durable Medical Equipment Choice offered to:  Patient  DME Arranged:  Oxygen DME Agency:  [place]:  PT [facility] Agency:  [place]  Status of Service:  Completed, signed off  If discussed at [place] of Stay Meetings, dates discussed:    Additional Comments:  Santos, [place], RN 07/01/2016, 4:52 PM

## 2016-07-01 NOTE — Progress Notes (Signed)
Pt/family given discharge instructions, medication lists, follow up appointments, and when to call the doctor.  Pt/family verbalizes understanding. Patient given incentive spirometer and educated on use. All questions answered. Payton Emerald, RN

## 2016-07-02 ENCOUNTER — Telehealth: Payer: Self-pay | Admitting: Family Medicine

## 2016-07-02 NOTE — Telephone Encounter (Signed)
Called and spoke to [place] concerning pt's recent hospitalization. He states that pt is feeling better but very weak. She did come home on oxygen. [name] has a good understanding of pt's medications but is going to pharmacy to pick up later today and if he has any questions he will call back. He declined to make an appt today but states he will call back on Monday to schedule.  He would like pt  to go thru weekend on oxygen and see how she is. After hour protocol was discussed and he verified understanding. [name] was informed if he needed anything to call the office.

## 2016-07-06 DIAGNOSIS — R262 Difficulty in walking, not elsewhere classified: Secondary | ICD-10-CM | POA: Diagnosis not present

## 2016-07-06 DIAGNOSIS — Z7901 Long term (current) use of anticoagulants: Secondary | ICD-10-CM | POA: Diagnosis not present

## 2016-07-06 DIAGNOSIS — K449 Diaphragmatic hernia without obstruction or gangrene: Secondary | ICD-10-CM | POA: Diagnosis not present

## 2016-07-06 DIAGNOSIS — I5033 Acute on chronic diastolic (congestive) heart failure: Secondary | ICD-10-CM | POA: Diagnosis not present

## 2016-07-06 DIAGNOSIS — Z8673 Personal history of transient ischemic attack (TIA), and cerebral infarction without residual deficits: Secondary | ICD-10-CM | POA: Diagnosis not present

## 2016-07-06 DIAGNOSIS — I110 Hypertensive heart disease with heart failure: Secondary | ICD-10-CM | POA: Diagnosis not present

## 2016-07-06 DIAGNOSIS — I480 Paroxysmal atrial fibrillation: Secondary | ICD-10-CM | POA: Diagnosis not present

## 2016-07-06 DIAGNOSIS — R531 Weakness: Secondary | ICD-10-CM | POA: Diagnosis not present

## 2016-07-07 ENCOUNTER — Ambulatory Visit (INDEPENDENT_AMBULATORY_CARE_PROVIDER_SITE_OTHER): Payer: Medicare Other | Admitting: Family Medicine

## 2016-07-07 ENCOUNTER — Telehealth: Payer: Self-pay

## 2016-07-07 ENCOUNTER — Encounter: Payer: Self-pay | Admitting: Family Medicine

## 2016-07-07 ENCOUNTER — Other Ambulatory Visit: Payer: Self-pay

## 2016-07-07 VITALS — BP 112/70 | HR 79 | Wt 163.0 lb

## 2016-07-07 DIAGNOSIS — E876 Hypokalemia: Secondary | ICD-10-CM | POA: Diagnosis not present

## 2016-07-07 DIAGNOSIS — I10 Essential (primary) hypertension: Secondary | ICD-10-CM

## 2016-07-07 DIAGNOSIS — I480 Paroxysmal atrial fibrillation: Secondary | ICD-10-CM

## 2016-07-07 DIAGNOSIS — K449 Diaphragmatic hernia without obstruction or gangrene: Secondary | ICD-10-CM | POA: Diagnosis not present

## 2016-07-07 DIAGNOSIS — K3189 Other diseases of stomach and duodenum: Secondary | ICD-10-CM

## 2016-07-07 LAB — CBC WITH DIFFERENTIAL/PLATELET
BASOS ABS: 0 {cells}/uL (ref 0–200)
Basophils Relative: 0 %
EOS ABS: 226 {cells}/uL (ref 15–500)
Eosinophils Relative: 2 %
HCT: 38.3 % (ref 35.0–45.0)
HEMOGLOBIN: 12.4 g/dL (ref 11.7–15.5)
Lymphocytes Relative: 41 %
Lymphs Abs: 4633 cells/uL — ABNORMAL HIGH (ref 850–3900)
MCH: 27.7 pg (ref 27.0–33.0)
MCHC: 32.4 g/dL (ref 32.0–36.0)
MCV: 85.5 fL (ref 80.0–100.0)
MPV: 8.9 fL (ref 7.5–12.5)
Monocytes Absolute: 1017 cells/uL — ABNORMAL HIGH (ref 200–950)
Monocytes Relative: 9 %
NEUTROS ABS: 5424 {cells}/uL (ref 1500–7800)
Neutrophils Relative %: 48 %
Platelets: 687 10*3/uL — ABNORMAL HIGH (ref 140–400)
RBC: 4.48 MIL/uL (ref 3.80–5.10)
RDW: 14.5 % (ref 11.0–15.0)
WBC: 11.3 10*3/uL — AB (ref 4.0–10.5)

## 2016-07-07 LAB — COMPREHENSIVE METABOLIC PANEL
ALBUMIN: 3.4 g/dL — AB (ref 3.6–5.1)
ALK PHOS: 43 U/L (ref 33–130)
ALT: 12 U/L (ref 6–29)
AST: 17 U/L (ref 10–35)
BUN: 16 mg/dL (ref 7–25)
CO2: 26 mmol/L (ref 20–31)
Calcium: 9.1 mg/dL (ref 8.6–10.4)
Chloride: 102 mmol/L (ref 98–110)
Creat: 1.01 mg/dL — ABNORMAL HIGH (ref 0.60–0.88)
Glucose, Bld: 103 mg/dL — ABNORMAL HIGH (ref 65–99)
POTASSIUM: 4.6 mmol/L (ref 3.5–5.3)
SODIUM: 143 mmol/L (ref 135–146)
Total Bilirubin: 0.3 mg/dL (ref 0.2–1.2)
Total Protein: 6.6 g/dL (ref 6.1–8.1)

## 2016-07-07 MED ORDER — ONDANSETRON HCL 4 MG PO TABS: 4.0000 mg | ORAL_TABLET | Freq: Three times a day (TID) | ORAL | 0 refills | Status: DC | PRN

## 2016-07-07 NOTE — Progress Notes (Addendum)
   Subjective:    Patient ID: [name], female    DOB: 07-02-1930, 83 y.o.   MRN: [id number]  HPI She  is here for post hospital follow-up. She was admitted for treatment of nausea and vomiting and found to have a gastric volvulus that spontaneously resolved. She was also noted to have a large hiatus hernia. She also history of PAF and is presently on appropriate medications for that. She does follow-up with Dr. [name] concerning this. Presently she is having no chest pain and did have one episode of nausea She is slightly short of breath with minimal activity but her pulse ox is in the 94-95 range. Review of the record indicates she did have hypokalemia as well as some dehydration. She was in the hospital for approximately 0 days.  Review of Systems     Objective:   Physical Exam Alert and in no distress. Tympanic membranes and canals are normal. Pharyngeal area is normal. Neck is supple without adenopathy or thyromegaly. Cardiac exam shows an irregular  rhythm without murmurs or gallops. Lungs are clear to auscultation.  Medications were reviewed.  The hospital record including x-rays, discharge summary and lab work was reviewed.      Assessment & Plan:  Hypokalemia - Plan: Comprehensive metabolic panel  Paroxysmal atrial fibrillation ([place]) - Plan: CBC with Differential/Platelet, Comprehensive metabolic panel  Essential hypertension, benign - Plan: CBC with Differential/Platelet, Comprehensive metabolic panel  Gastric volvulus - Plan: ondansetron (ZOFRAN) 4 MG tablet, Ambulatory referral to Gastroenterology  [place] (hiatus hernia) - Plan: ondansetron (ZOFRAN) 4 MG tablet, Ambulatory referral to Gastroenterology  We'll give Zofran to hopefully help prevent her nausea and vomiting. She will definitely need to be followed up by GI to further evaluate the hernia and volvulus. This will be set up.She will also need follow-up with cardiology concerning her PAF especial going to have any  procedures done. At this point I think a soft I would also be appropriate.  He is getting oxygen at home but I see no need to continue this since her pulse ox is 94 and above

## 2016-07-07 NOTE — Telephone Encounter (Signed)
[name] has been informed okay

## 2016-07-07 NOTE — Patient Outreach (Signed)
[facility]) Care Management  07/07/2016  [name] 04/11/1931 [id number]       EMMI-PNA RED ON EMMI ALERT Day # 4 Date: 07/06/16 Red Alert Reason: "Feeling better overall? No"   Outreach attempt #1 to patient. Spoke with patient. She reports that she is doing fairly good. Reviewed and addressed red alert. Patient reports that she was discharged home and was still having some issues with diarrhea related to antibiotic usage. However, she reports that diarrhea is resolving and has improved. She denies any SOB or resp symptoms. She was sent home on home oxygen and has been using it prn. Patient states that HHPT came to see her on yesterday. She has PCP f/u appt today. Patient reports she has supportive spouse and other family to assist her with transportation. She also voices that she is a resident of [facility] and has staff there to support her as well. She denies any issues with her meds. Spouse is helping her manage her meds. She is aware of new and changed meds per d/c summary. RN CM educated patient of s/s of worsening condition and when to seek medical attention. She voiced understanding. Advised patient that she would continue to get automated EMMI-PNA calls over the course of the next two weeks and will receive a call from a nurse if any of her responses are abnormal. She voiced understanding and was appreciative of call. No further RN CM needs or concerns at this time.   Plan: RN CM will notify [facility] administrative assistant of case status.   Enzo Montgomery, RN,BSN,CCM [place] Management Telephonic Care Management Coordinator Direct Phone: [phone] Toll Free: [phone] Fax: [phone]

## 2016-07-07 NOTE — Telephone Encounter (Signed)
Called for okay on P/T 2 x 2weeks 1x1week gate and balance and strengthen

## 2016-07-07 NOTE — Telephone Encounter (Signed)
ok 

## 2016-07-09 ENCOUNTER — Other Ambulatory Visit: Payer: Self-pay

## 2016-07-09 DIAGNOSIS — Z8673 Personal history of transient ischemic attack (TIA), and cerebral infarction without residual deficits: Secondary | ICD-10-CM | POA: Diagnosis not present

## 2016-07-09 DIAGNOSIS — R262 Difficulty in walking, not elsewhere classified: Secondary | ICD-10-CM | POA: Diagnosis not present

## 2016-07-09 DIAGNOSIS — I110 Hypertensive heart disease with heart failure: Secondary | ICD-10-CM | POA: Diagnosis not present

## 2016-07-09 DIAGNOSIS — Z7901 Long term (current) use of anticoagulants: Secondary | ICD-10-CM | POA: Diagnosis not present

## 2016-07-09 DIAGNOSIS — R531 Weakness: Secondary | ICD-10-CM | POA: Diagnosis not present

## 2016-07-09 DIAGNOSIS — I480 Paroxysmal atrial fibrillation: Secondary | ICD-10-CM | POA: Diagnosis not present

## 2016-07-09 DIAGNOSIS — K449 Diaphragmatic hernia without obstruction or gangrene: Secondary | ICD-10-CM | POA: Diagnosis not present

## 2016-07-09 DIAGNOSIS — K3189 Other diseases of stomach and duodenum: Secondary | ICD-10-CM | POA: Diagnosis not present

## 2016-07-09 DIAGNOSIS — I5033 Acute on chronic diastolic (congestive) heart failure: Secondary | ICD-10-CM | POA: Diagnosis not present

## 2016-07-09 NOTE — Patient Outreach (Signed)
[facility]) Care Management  07/09/2016  [name] April 06, 1931 [id number]     EMMI-PNA RED ON EMMI ALERT Day # 6 Date: 07/08/16 Red Alert Reason:" Had diarrhea or felt sick to stomach? Yes"    Outreach attempt #1 to patient. No answer at present. RN CM left HIPAA compliant voicemail message along with contact info.      Plan: RN CM will make outreach attempt to patient within one business day if no return call from patient.    Enzo Montgomery, RN,BSN,CCM [place] Management Telephonic Care Management Coordinator Direct Phone: [phone] Toll Free: [phone] Fax: [phone]

## 2016-07-12 ENCOUNTER — Other Ambulatory Visit: Payer: Self-pay

## 2016-07-12 DIAGNOSIS — K449 Diaphragmatic hernia without obstruction or gangrene: Secondary | ICD-10-CM | POA: Diagnosis not present

## 2016-07-12 DIAGNOSIS — Z7901 Long term (current) use of anticoagulants: Secondary | ICD-10-CM | POA: Diagnosis not present

## 2016-07-12 DIAGNOSIS — I5033 Acute on chronic diastolic (congestive) heart failure: Secondary | ICD-10-CM | POA: Diagnosis not present

## 2016-07-12 DIAGNOSIS — I110 Hypertensive heart disease with heart failure: Secondary | ICD-10-CM | POA: Diagnosis not present

## 2016-07-12 DIAGNOSIS — R262 Difficulty in walking, not elsewhere classified: Secondary | ICD-10-CM | POA: Diagnosis not present

## 2016-07-12 DIAGNOSIS — I480 Paroxysmal atrial fibrillation: Secondary | ICD-10-CM | POA: Diagnosis not present

## 2016-07-12 DIAGNOSIS — Z8673 Personal history of transient ischemic attack (TIA), and cerebral infarction without residual deficits: Secondary | ICD-10-CM | POA: Diagnosis not present

## 2016-07-12 DIAGNOSIS — R531 Weakness: Secondary | ICD-10-CM | POA: Diagnosis not present

## 2016-07-12 NOTE — Patient Outreach (Addendum)
[facility]) Care Management  07/12/2016  [name] Dec 01, 1930 [id number]   EMMI-PNA RED ON EMMI ALERT Day # 6 Date: 07/08/16 Red Alert Reason:" Had diarrhea or felt sick to stomach? Yes"  Day #9 Date: 07/11/16 Red Alert Reason: " Back to pre-sick activity level? No"    Outreach attempt #2 to patient. No answer at present and RN CM unable to leave message.   Plan: RN CM will send unsuccessful outreach letter to patient and close case if no response from patient within 10 business days.   Enzo Montgomery, RN,BSN,CCM [place] Management Telephonic Care Management Coordinator Direct Phone: [phone] Toll Free: [phone] Fax: [phone]

## 2016-07-13 ENCOUNTER — Other Ambulatory Visit: Payer: Self-pay

## 2016-07-13 NOTE — Patient Outreach (Signed)
[place] [facility]) Care Management  07/13/2016  [name] 06-12-1930 [id number]      EMMI-PNA RED ON EMMI ALERT Day # 10 Date: 07/12/16 Red Alert Reason:" Had diarrhea or felt sick to stomach? Yes"   RN CM has already made outreach attempt to try to reach patient regarding the issue. Patient letter has also been mailed to patient. Awaiting response from patient.    Plan: RN CM will close case out per guidelines if no return call from patient.   Enzo Montgomery, RN,BSN,CCM [place] Management Telephonic Care Management Coordinator Direct Phone: [phone] Toll Free: [phone] Fax: [phone]

## 2016-07-13 NOTE — Patient Outreach (Signed)
[facility]) Care Management  07/13/2016  [name] [birth year] [id number]   Patient triggered RED on Emmi Stroke Dashboard, notification sent to:  Enzo Montgomery, RN

## 2016-07-14 DIAGNOSIS — I5033 Acute on chronic diastolic (congestive) heart failure: Secondary | ICD-10-CM | POA: Diagnosis not present

## 2016-07-14 DIAGNOSIS — I110 Hypertensive heart disease with heart failure: Secondary | ICD-10-CM | POA: Diagnosis not present

## 2016-07-14 DIAGNOSIS — I480 Paroxysmal atrial fibrillation: Secondary | ICD-10-CM | POA: Diagnosis not present

## 2016-07-14 DIAGNOSIS — R531 Weakness: Secondary | ICD-10-CM | POA: Diagnosis not present

## 2016-07-14 DIAGNOSIS — K449 Diaphragmatic hernia without obstruction or gangrene: Secondary | ICD-10-CM | POA: Diagnosis not present

## 2016-07-14 DIAGNOSIS — Z8673 Personal history of transient ischemic attack (TIA), and cerebral infarction without residual deficits: Secondary | ICD-10-CM | POA: Diagnosis not present

## 2016-07-14 DIAGNOSIS — Z7901 Long term (current) use of anticoagulants: Secondary | ICD-10-CM | POA: Diagnosis not present

## 2016-07-14 DIAGNOSIS — R262 Difficulty in walking, not elsewhere classified: Secondary | ICD-10-CM | POA: Diagnosis not present

## 2016-07-15 ENCOUNTER — Telehealth: Payer: Self-pay

## 2016-07-15 NOTE — Telephone Encounter (Signed)
She how she is doing. I did not get this until after 5 Pm on Thursday

## 2016-07-15 NOTE — Telephone Encounter (Signed)
PT's husband called to let you know that pt is having diarrhea. Not sure if from any medications prescribed by hospital. Just completed ABX therapy as well.  Should she take immodium? [phone]. Thanks, RLB

## 2016-07-19 NOTE — Telephone Encounter (Signed)
[name] informed and verbalized understanding

## 2016-07-19 NOTE — Telephone Encounter (Signed)
I tried to call pt but there was no answer

## 2016-07-19 NOTE — Telephone Encounter (Signed)
Talked to Mr.Wojcicki and he stated she was feeling better but Mrs.Pidcock been having up and downs with AFIB. She has been struggling since she left hospital the hospitalitis gave her Metoprolol tartrate 25 mg BID instead of Metoprolol Succinate  50 mg once a day does she go back to the succinate or stay on tartrate or Is this something she needs to discuss this with Heart Dr.

## 2016-07-19 NOTE — Telephone Encounter (Signed)
Have her discuss this with her heart doctor

## 2016-07-20 DIAGNOSIS — Z7901 Long term (current) use of anticoagulants: Secondary | ICD-10-CM | POA: Diagnosis not present

## 2016-07-20 DIAGNOSIS — K449 Diaphragmatic hernia without obstruction or gangrene: Secondary | ICD-10-CM | POA: Diagnosis not present

## 2016-07-20 DIAGNOSIS — R262 Difficulty in walking, not elsewhere classified: Secondary | ICD-10-CM | POA: Diagnosis not present

## 2016-07-20 DIAGNOSIS — I110 Hypertensive heart disease with heart failure: Secondary | ICD-10-CM | POA: Diagnosis not present

## 2016-07-20 DIAGNOSIS — I480 Paroxysmal atrial fibrillation: Secondary | ICD-10-CM | POA: Diagnosis not present

## 2016-07-20 DIAGNOSIS — R531 Weakness: Secondary | ICD-10-CM | POA: Diagnosis not present

## 2016-07-20 DIAGNOSIS — I5033 Acute on chronic diastolic (congestive) heart failure: Secondary | ICD-10-CM | POA: Diagnosis not present

## 2016-07-20 DIAGNOSIS — Z8673 Personal history of transient ischemic attack (TIA), and cerebral infarction without residual deficits: Secondary | ICD-10-CM | POA: Diagnosis not present

## 2016-07-22 ENCOUNTER — Telehealth: Payer: Self-pay | Admitting: Cardiology

## 2016-07-22 ENCOUNTER — Telehealth: Payer: Self-pay | Admitting: Physician Assistant

## 2016-07-22 DIAGNOSIS — K449 Diaphragmatic hernia without obstruction or gangrene: Secondary | ICD-10-CM | POA: Diagnosis not present

## 2016-07-22 NOTE — Telephone Encounter (Signed)
Returned call to patient she stated she wanted to know if we received clearance letter form [facility].After reviewing chart [name] in our medical records received surgical clearance.She will put in [facility] PA's box for your appointment with him on Mon 07/26/16 at 11:00 am.

## 2016-07-22 NOTE — Telephone Encounter (Signed)
Please call,concerning pt's up coming surgery.

## 2016-07-22 NOTE — Telephone Encounter (Signed)
Received records from [facility] for appointment on 07/26/16 with Almyra Deforest, PA.  Records put with Hao's schedule for 07/26/16. lp

## 2016-07-26 ENCOUNTER — Encounter: Payer: Self-pay | Admitting: Physician Assistant

## 2016-07-26 ENCOUNTER — Ambulatory Visit (INDEPENDENT_AMBULATORY_CARE_PROVIDER_SITE_OTHER): Payer: Medicare Other | Admitting: Physician Assistant

## 2016-07-26 VITALS — BP 122/70 | HR 70 | Ht 66.0 in | Wt 160.6 lb

## 2016-07-26 DIAGNOSIS — I480 Paroxysmal atrial fibrillation: Secondary | ICD-10-CM

## 2016-07-26 DIAGNOSIS — Z01818 Encounter for other preprocedural examination: Secondary | ICD-10-CM | POA: Diagnosis not present

## 2016-07-26 DIAGNOSIS — I10 Essential (primary) hypertension: Secondary | ICD-10-CM | POA: Diagnosis not present

## 2016-07-26 DIAGNOSIS — E785 Hyperlipidemia, unspecified: Secondary | ICD-10-CM | POA: Diagnosis not present

## 2016-07-26 MED ORDER — METOPROLOL TARTRATE 25 MG PO TABS
25.0000 mg | ORAL_TABLET | Freq: Two times a day (BID) | ORAL | 3 refills | Status: DC
Start: 2016-07-26 — End: 2017-07-09

## 2016-07-26 MED ORDER — FUROSEMIDE 40 MG PO TABS: 40.0000 mg | ORAL_TABLET | Freq: Every day | ORAL | 3 refills | Status: DC

## 2016-07-26 MED ORDER — DILTIAZEM HCL ER COATED BEADS 180 MG PO CP24: 180.0000 mg | ORAL_CAPSULE | Freq: Every day | ORAL | 3 refills | Status: DC

## 2016-07-26 MED ORDER — PANTOPRAZOLE SODIUM 40 MG PO TBEC: 40.0000 mg | DELAYED_RELEASE_TABLET | Freq: Every day | ORAL | 3 refills | Status: DC

## 2016-07-26 NOTE — Patient Instructions (Signed)
Medication Instructions:  -INCREASE Cardizem to 180 mg (1 tablet) once daily.   Follow-Up: Your physician recommends that you schedule a follow-up appointment in: 3 MONTHS with Dr. [name].    Any Other Special Instructions Will Be Listed Below (If Applicable).     If you need a refill on your cardiac medications before your next appointment, please call your pharmacy.

## 2016-07-26 NOTE — Progress Notes (Signed)
Cardiology Office Note    Date:  07/27/2016   ID:  [name], DOB August 02, 1930, MRN [id number]  PCP:  Wyatt Haste, MD  Cardiologist:  Dr. [name]  [name] is a 83 y.o. female who is being seen today for preoperative clearance at the request of  Dr. Ralene Ok   Chief Complaint  Patient presents with  . Surgical Clearance    Hernia repair surgery  pt c/o occasional SOB--no other Sx.; was in hospital for 11 days in March  . Medication Problem    Pt was put on several meds and some changed--pt would like to know if she needs to continue taking these    History of Present Illness:  [name] is a 83 y.o. female with PMH of HTN, HLD, TIA and PAF on eliquis. Patient was admitted in March 2018 with nausea vomiting and the chest pain. Initial EKG showed normal sinus rhythm, CT of abdomen and pelvis showed large intrathoracic gastric hernia, no wall thickening all pneumatosis. Finding most likely representing spontaneous interval reduction of gastric volvulus. KUB was unremarkable. Ultrasound of abdomen does not show any gallstones. During the hospitalization, she developed worsening shortness of breath after aggressive IV hydration. She was treated with IV Lasix afterward with good urinary output. She was also found to be hypothyroid and received thyroid replacement IV. Her hospital stay has been complicated by atrial fibrillation with RVR in the initially placed under control using IV Cardizem drip. She is also on eliquis. Troponin was elevated and a BNP was 836. Cardiology was consulted for atrial fibrillation and elevated troponin. Elevated troponin was felt to be related to heart failure and the pneumonia. Echocardiogram obtained on 06/28/2016 showed EF 78-29%, grade 1 diastolic dysfunction. She required cardioversion on 06/29/2016, despite the fact she was shocked 3 times by Dr. Marlou Porch, she was only able to stay in NSR for 10-15 sec before converting back to afib.   She  presents today for cardiology office visit. She has very good cardiac awareness and admits since discharge, she has had several episodes of recurrent atrial fibrillations well. Otherwise, she seems to be doing quite well on 25 mg twice a day of metoprolol and 120 mg daily of diltiazem CD. She has been seen by Dr. Rosendo Gros, since discharge, she continued to have episodic acid reflux symptom with significant burning sensation. Dr. Ralene Ok recommended a robotic hiatal hernia repair with [employer] Fundoplication. From the current guideline, she will need to continue eliquis uninterrupted for at least 4 weeks after previous cardioversion, that means the earliest day she can come off eliquis is after April 11 (4 weeks out from 3/13). I did check with DOD Dr. Stanford Breed to see if there is any change in the guideline since she has been having intermittent palpitation since discharge. Eventually, we agreed that there is no current guideline to suggest prolonging anticoagulation therapy after DCCV belong the 4 weeks regardless of recurrent palpitation. The earliest day she can hold eliquis would be April 12, she will need to hold eliquis for 5 doses prior to her surgery. I will however increase her Cardizem to 180 mg daily to better control her heart rate.   Past Medical History:  Diagnosis Date  . Arthritis    "hands, little in my feet" (06/22/2016)  . Candidiasis of vulva and vagina   . Cystocele, midline   . Disorder of bone and cartilage, unspecified   . Fibroids   . Hypertension   . Large  hiatal hernia 06/23/2016  . Migraine    "none in the last few years" (06/22/2016)  . Other chronic cystitis   . Other sign and symptom in breast   . Paroxysmal atrial fibrillation ([place]) 11/30/2012  . Pneumonia    "I've had walking pneumonia a couple times"  (06/22/2016)  . Pneumonia dx'd 06/15/2016  . Rectal incontinence   . TIA (transient ischemic attack) 2015    Past Surgical History:  Procedure Laterality Date  .  CARDIOVERSION N/A 06/29/2016   Procedure: CARDIOVERSION;  Surgeon: Jerline Pain, MD;  Location: [facility] OR;  Service: Cardiovascular;  Laterality: N/A;  . CATARACT EXTRACTION W/ INTRAOCULAR LENS IMPLANT Left   . DILATION AND CURETTAGE OF UTERUS    . HYSTEROSCOPY W/D&C  03/16/1999  . TEE WITHOUT CARDIOVERSION N/A 11/06/2012   Procedure: TRANSESOPHAGEAL ECHOCARDIOGRAM (TEE);  Surgeon: Lelon Perla, MD;  Location: [facility] ENDOSCOPY;  Service: Cardiovascular;  Laterality: N/A;  . TONSILLECTOMY  1937  . VAGINAL HYSTERECTOMY  09/2005   Anterior repair; Removal of urethral caruncle./notes 09/02/2015    Current Medications: Outpatient Medications Prior to Visit  Medication Sig Dispense Refill  . CALCIUM CARBONATE-VIT D-MIN PO Take 2 tablets by mouth daily.     Marland Kitchen CRANBERRY EXTRACT PO Take 1 capsule by mouth daily.     Marland Kitchen ELIQUIS 5 MG TABS tablet take 1 tablet by mouth twice a day 60 tablet 1  . feeding supplement, ENSURE ENLIVE, (ENSURE ENLIVE) LIQD Take 237 mLs by mouth 2 (two) times daily between meals. 237 mL 12  . glucosamine-chondroitin 500-400 MG tablet Take 1 tablet by mouth 3 (three) times daily.      Marland Kitchen loratadine (CLARITIN) 10 MG tablet Take 5 mg by mouth daily as needed for allergies.    Marland Kitchen ondansetron (ZOFRAN) 4 MG tablet Take 1 tablet (4 mg total) by mouth every 8 (eight) hours as needed for nausea or vomiting. 20 tablet 0  . Polyethyl Glycol-Propyl Glycol (SYSTANE OP) Place 1 drop into both eyes daily as needed (for dry eyes).    . pravastatin (PRAVACHOL) 40 MG tablet take 1 tablet by mouth once daily 30 tablet 11  . diltiazem (CARDIZEM CD) 120 MG 24 hr capsule Take 1 capsule (120 mg total) by mouth daily. 30 capsule 0  . furosemide (LASIX) 40 MG tablet Take 1 tablet (40 mg total) by mouth daily. 30 tablet 0  . metoprolol tartrate (LOPRESSOR) 25 MG tablet Take 1 tablet (25 mg total) by mouth 2 (two) times daily. 60 tablet 0  . pantoprazole (PROTONIX) 40 MG tablet Take 1 tablet (40 mg total) by  mouth daily at 6 (six) AM. 30 tablet 0  . azelastine (ASTELIN) 137 MCG/SPRAY nasal spray Place 1 spray into the nose 2 (two) times daily. Use in each nostril as directed (Patient taking differently: Place 1 spray into the nose 2 (two) times daily as needed for rhinitis or allergies. Use in each nostril as directed) 30 mL 11  . saccharomyces boulardii (FLORASTOR) 250 MG capsule Take 1 capsule (250 mg total) by mouth 2 (two) times daily. 20 capsule 0   No facility-administered medications prior to visit.      Allergies:   Lisinopril   Social History   Social History  . Marital status: Married    Spouse name: N/A  . Number of children: 2  . Years of education: N/A   Social History Main Topics  . Smoking status: Never Smoker  . Smokeless tobacco: Never Used  .  Alcohol use 1.2 oz/week    2 Glasses of wine per week  . Drug use: No  . Sexual activity: Not Currently   Other Topics Concern  . None   Social History Narrative  . None     Family History:  The patient's family history includes Heart disease in her father; Hypertension in her mother.   ROS:   Please see the history of present illness.    ROS All other systems reviewed and are negative.   PHYSICAL EXAM:   VS:  BP 122/70 (BP Location: Left Arm, Patient Position: Sitting, Cuff Size: Normal)   Pulse 70   Ht 5\' 6"  (1.676 m)   Wt 160 lb 9.6 oz (72.8 kg)   BMI 25.92 kg/m    GEN: Well nourished, well developed, in no acute distress  HEENT: normal  Neck: no JVD, carotid bruits, or masses Cardiac: RRR; no murmurs, rubs, or gallops,no edema  Respiratory:  clear to auscultation bilaterally, normal work of breathing GI: soft, nontender, nondistended, + BS MS: no deformity or atrophy  Skin: warm and dry, no rash Neuro:  Alert and Oriented x 3, Strength and sensation are intact Psych: euthymic mood, full affect  Wt Readings from Last 3 Encounters:  07/26/16 160 lb 9.6 oz (72.8 kg)  07/07/16 163 lb (73.9 kg)  07/01/16  161 lb (73 kg)      Studies/Labs Reviewed:   EKG:  EKG is ordered today.  The ekg ordered today demonstrates Normal sinus rhythm without significant ST-T wave changes, poor progression in anterior leads.  Recent Labs: 06/26/2016: TSH 9.566 06/28/2016: B Natriuretic Peptide 416.4 06/30/2016: Magnesium 2.1 07/07/2016: ALT 12; BUN 16; Creat 1.01; Hemoglobin 12.4; Platelets 687; Potassium 4.6; Sodium 143   Lipid Panel    Component Value Date/Time   CHOL 89 06/27/2016 0140   TRIG 55 06/27/2016 0140   HDL 41 06/27/2016 0140   CHOLHDL 2.2 06/27/2016 0140   VLDL 11 06/27/2016 0140   LDLCALC 37 06/27/2016 0140    Additional studies/ records that were reviewed today include:   Echo 06/28/2016 LV EF: 50% -   55%  - Left ventricle: The cavity size was normal. Wall thickness was   increased in a pattern of mild LVH. Systolic function was normal.   The estimated ejection fraction was in the range of 50% to 55%.   Wall motion was normal; there were no regional wall motion   abnormalities. Doppler parameters are consistent with abnormal   left ventricular relaxation (grade 1 diastolic dysfunction). - Mitral valve: Moderately calcified annulus. There was moderate   regurgitation. - Left atrium: The atrium was mildly dilated. - Right atrium: The atrium was mildly dilated.    ASSESSMENT:    1. Preoperative clearance   2. Paroxysmal atrial fibrillation (HCC)   3. Essential hypertension   4. Hyperlipidemia, unspecified hyperlipidemia type      PLAN:  In order of problems listed above:  1. Preoperative clearance prior to had a hernia repair by Dr. Ralene Ok  - She denies any chest discomfort or shortness of breath. She is cleared from cardiology perspective to proceed with surgery. However since she just underwent cardioversion on 06/29/2016, per current guidelines, she will need to continue eliquis uninterrupted for 4 weeks after cardioversion. Them he started early this time she  can start holding her eliquis would be April 11th. He will need to hold 5 doses of eliquis prior to surgery and we recommend restart eliquis as soon as the  surgeon deems bleeding risk is low. Otherwise she is a low risk patient for the intended surgery  2. Paroxysmal atrial fibrillation on eliquis: She underwent cardioversion on 06/29/2016, she had 3 attempted shock, however she went back to atrial fibrillation after maintaining sinus rhythm for only 10-15 seconds. That night, she spontaneously converted. Since discharge, she continued to have intermittent palpitation, I did discuss with Dr. Stanford Breed, DOD, there is no current guidelines to suggest prolonging the eliquis therapy after cardioversion for longer than 4 weeks due to recurrent palpitation.  3. Hypertension: Blood pressures stable 122/70 today. I increased her diltiazem CD to 180 mg daily.  4. Hyperlipidemia: On Pravachol 40 mg daily    Medication Adjustments/Labs and Tests Ordered: Current medicines are reviewed at length with the patient today.  Concerns regarding medicines are outlined above.  Medication changes, Labs and Tests ordered today are listed in the Patient Instructions below. Patient Instructions  Medication Instructions:  -INCREASE Cardizem to 180 mg (1 tablet) once daily.   Follow-Up: Your physician recommends that you schedule a follow-up appointment in: 3 MONTHS with Dr. [name].    Any Other Special Instructions Will Be Listed Below (If Applicable).     If you need a refill on your cardiac medications before your next appointment, please call your pharmacy.      Hilbert Corrigan, [place]  07/27/2016 12:03 PM    [address], [place], [place]  [address] Phone: [phone]; Fax: [phone]

## 2016-07-27 ENCOUNTER — Encounter: Payer: Self-pay | Admitting: Physician Assistant

## 2016-07-27 DIAGNOSIS — K449 Diaphragmatic hernia without obstruction or gangrene: Secondary | ICD-10-CM | POA: Diagnosis not present

## 2016-07-27 DIAGNOSIS — Z7901 Long term (current) use of anticoagulants: Secondary | ICD-10-CM | POA: Diagnosis not present

## 2016-07-27 DIAGNOSIS — R531 Weakness: Secondary | ICD-10-CM | POA: Diagnosis not present

## 2016-07-27 DIAGNOSIS — I480 Paroxysmal atrial fibrillation: Secondary | ICD-10-CM | POA: Diagnosis not present

## 2016-07-27 DIAGNOSIS — Z8673 Personal history of transient ischemic attack (TIA), and cerebral infarction without residual deficits: Secondary | ICD-10-CM | POA: Diagnosis not present

## 2016-07-27 DIAGNOSIS — R262 Difficulty in walking, not elsewhere classified: Secondary | ICD-10-CM | POA: Diagnosis not present

## 2016-07-27 DIAGNOSIS — I5033 Acute on chronic diastolic (congestive) heart failure: Secondary | ICD-10-CM | POA: Diagnosis not present

## 2016-07-27 DIAGNOSIS — I110 Hypertensive heart disease with heart failure: Secondary | ICD-10-CM | POA: Diagnosis not present

## 2016-07-28 ENCOUNTER — Telehealth: Payer: Self-pay | Admitting: Physician Assistant

## 2016-07-28 ENCOUNTER — Other Ambulatory Visit: Payer: Self-pay

## 2016-07-28 NOTE — Patient Outreach (Signed)
[facility]) Care Management  07/28/2016  DAVA RENSCH 1931-02-04 [id number]   EMMI-PNA RED ON EMMI ALERT Day # 10 Date: 07/12/16 Red Alert Reason:" Had diarrhea or felt sick to stomach? Yes"     Multiple attempts to establish contact with patient without success. No response from letter mailed to patient. RN CM will close case at this time.     Plan: RN CM will notify [facility] administrative assistant of case status.   Enzo Montgomery, RN,BSN,CCM [facility] Management Telephonic Care Management Coordinator Direct Phone: [phone] Toll Free: [phone] Fax: [phone]

## 2016-07-28 NOTE — Telephone Encounter (Signed)
Clearance & instructions for holding NOAC are in PA note.  Note routed to [facility]

## 2016-07-28 NOTE — Telephone Encounter (Signed)
New message      Request for surgical clearance:  1. What type of surgery is being performed? Hernia repair  2. When is this surgery scheduled?  Pending clearance  3. Are there any medications that need to be held prior to surgery and how long?  Cardiac clearance---not sure if meds need to be held  Name of physician performing surgery?  Dr Oda Cogan  What is your office phone and fax number?  Fax [phone]

## 2016-07-28 NOTE — Telephone Encounter (Signed)
PLAN:  In order of problems listed above:  1. Preoperative clearance prior to had a hernia repair by Dr. Ralene Ok             - She denies any chest discomfort or shortness of breath. She is cleared from cardiology perspective to proceed with surgery. However since she just underwent cardioversion on 06/29/2016, per current guidelines, she will need to continue eliquis uninterrupted for 4 weeks after cardioversion. Them he started early this time she can start holding her eliquis would be April 11th. He will need to hold 5 doses of eliquis prior to surgery and we recommend restart eliquis as soon as the surgeon deems bleeding risk is low. Otherwise she is a low risk patient for the intended surgery

## 2016-08-11 NOTE — Patient Instructions (Addendum)
JAKKI DOUGHTY  08/11/2016   Your procedure is scheduled on: 08/20/2016    Report to [facility] Main  Entrance and take [facility] elevators to Admitting at 0515am.        Call this number if you have problems the morning of surgery [phone]   Remember: ONLY 1 PERSON MAY GO WITH YOU TO SHORT STAY TO GET  READY MORNING OF [place].  Do not eat food or drink liquids :After Midnight.     Take these medicines the morning of surgery with A SIP OF WATER:   Claritin if needed, metoprolol ( Lopressor), PRotonix, ey drops as usual, Rhinocort                                 You may not have any metal on your body including hair pins and              piercings  Do not wear jewelry, make-up, lotions, powders or perfumes, deodorant             Do not wear nail polish.  Do not shave  48 hours prior to surgery.              Do not bring valuables to the hospital. [place].  Contacts, dentures or bridgework may not be worn into surgery.  Leave suitcase in the car. After surgery it may be brought to your room.                     Please read over the following fact sheets you were given: _____________________________________________________________________             [facility] - Preparing for Surgery Before surgery, you can play an important role.  Because skin is not sterile, your skin needs to be as free of germs as possible.  You can reduce the number of germs on your skin by washing with CHG (chlorahexidine gluconate) soap before surgery.  CHG is an antiseptic cleaner which kills germs and bonds with the skin to continue killing germs even after washing. Please DO NOT use if you have an allergy to CHG or antibacterial soaps.  If your skin becomes reddened/irritated stop using the CHG and inform your nurse when you arrive at Short Stay. Do not shave (including legs and underarms) for at least 48 hours prior to the  first CHG shower.  You may shave your face/neck. Please follow these instructions carefully:  1.  Shower with CHG Soap the night before surgery and the  morning of Surgery.  2.  If you choose to wash your hair, wash your hair first as usual with your  normal  shampoo.  3.  After you shampoo, rinse your hair and body thoroughly to remove the  shampoo.                           4.  Use CHG as you would any other liquid soap.  You can apply chg directly  to the skin and wash                       Gently with a scrungie  or clean washcloth.  5.  Apply the CHG Soap to your body ONLY FROM THE NECK DOWN.   Do not use on face/ open                           Wound or open sores. Avoid contact with eyes, ears mouth and genitals (private parts).                       Wash face,  Genitals (private parts) with your normal soap.             6.  Wash thoroughly, paying special attention to the area where your surgery  will be performed.  7.  Thoroughly rinse your body with warm water from the neck down.  8.  DO NOT shower/wash with your normal soap after using and rinsing off  the CHG Soap.                9.  Pat yourself dry with a clean towel.            10.  Wear clean pajamas.            11.  Place clean sheets on your bed the night of your first shower and do not  sleep with pets. Day of Surgery : Do not apply any lotions/deodorants the morning of surgery.  Please wear clean clothes to the hospital/surgery center.  FAILURE TO FOLLOW THESE INSTRUCTIONS MAY RESULT IN THE CANCELLATION OF YOUR SURGERY PATIENT SIGNATURE_________________________________  NURSE SIGNATURE__________________________________  ________________________________________________________________________

## 2016-08-13 ENCOUNTER — Ambulatory Visit: Payer: Self-pay | Admitting: General Surgery

## 2016-08-13 NOTE — H&P (Signed)
History of Present Illness Ralene Ok MD; 07/22/2016 11:32 AM) The patient is a 83 year old female who presents with a hiatal hernia. Patient is a 83 year old female who was recently seen at [facility] recently secondary to pneumonia and a possible gastric volvulus with large hiatal hernia. Patient states that the night prior to admission she had a large meal and had some nausea and vomiting. She states this is the first recurrence had no previous episodes similar to this. Patient was being treated for pneumonia prior to this.  Patient underwent CT scan which revealed a large hiatal hernia. Patient also had by mouth contrast which revealed no gastric volvulus and no emergent surgery was required.  Patient has history of A. fib and sees Dr. [name]. She is currently on Elliquis, metoprolol, diltiazem. Patient is to see him on Monday to follow up from her hospitalization.   Allergies Erline Levine, RN; 07/22/2016 11:03 AM) Lisinopril *ANTIHYPERTENSIVES*   Medication History Erline Levine, RN; 07/22/2016 11:01 AM) DilTIAZem HCl ER Coated Beads (120MG  Capsule ER 24HR, Oral) Active. Boostrix (5-2.5-18.5 Suspension, Intramuscular) Active. Furosemide (40MG  Tablet, Oral) Active. Metoprolol Succinate ER (50MG  Tablet ER 24HR, Oral) Active. Metoprolol Tartrate (25MG  Tablet, Oral) Active. Pantoprazole Sodium (40MG  Tablet DR, Oral) Active. Ondansetron HCl (4MG  Tablet, Oral) Active. Pravastatin Sodium (40MG  Tablet, Oral) Active. Eliquis (5MG  Tablet, Oral) Active. Florastor (250MG  Capsule, Oral) Active. Medications Reconciled  Vitals Erline Levine RN; 07/22/2016 10:58 AM) 07/22/2016 10:58 AM Weight: 159.19 lb Height: 66in Height was reported by patient. Body Surface Area: 1.82 m Body Mass Index: 25.69 kg/m  Temp.: 98.67F(Oral)  Pulse: 72 (Regular)  P.OX: 95% (Room air) BP: 152/76 (Sitting, Left Arm, Standard)       Physical Exam Ralene Ok MD; 07/22/2016  11:32 AM) General Mental Status-Alert. General Appearance-Consistent with stated age. Hydration-Well hydrated. Voice-Normal.  Chest and Lung Exam Chest and lung exam reveals -quiet, even and easy respiratory effort with no use of accessory muscles and on auscultation, normal breath sounds, no adventitious sounds and normal vocal resonance. Inspection Chest Wall - Normal. Back - normal.  Cardiovascular Cardiovascular examination reveals -normal heart sounds, regular rate and rhythm with no murmurs and normal pedal pulses bilaterally.  Abdomen Inspection Inspection of the abdomen reveals - No Hernias. Skin - Scar - no surgical scars. Palpation/Percussion Palpation and Percussion of the abdomen reveal - Soft, Non Tender, No Rebound tenderness, No Rigidity (guarding) and No hepatosplenomegaly. Auscultation Auscultation of the abdomen reveals - Bowel sounds normal.  Neurologic Neurologic evaluation reveals -alert and oriented x 3 with no impairment of recent or remote memory. Mental Status-Normal.  Musculoskeletal Normal Exam - Left-Upper Extremity Strength Normal and Lower Extremity Strength Normal. Normal Exam - Right-Upper Extremity Strength Normal and Lower Extremity Strength Normal.    Assessment & Plan Ralene Ok MD; 07/22/2016 11:34 AM) LARGE HIATAL HERNIA (K44.9) Impression: Patient is an 83 year old female with a large type IV hiatal hernia.  1. The patient will require a robotic had hernia repair and Nissen fundoplication. I discussed this at length with the patient and her husband. 2. I discussed with her the risks and benefits the procedure to include but not limited to: Infection, bleeding, damage to structures, possible need for further surgery, possible pneumothorax. Patient was understanding and wished to proceed 3. Patient will require cardiac evaluation prior to scheduling surgery by Dr. [name] and his team. Patient is currently on Elliquis  will need to be off of his prior surgery.

## 2016-08-16 ENCOUNTER — Ambulatory Visit (HOSPITAL_COMMUNITY)
Admission: RE | Admit: 2016-08-16 | Discharge: 2016-08-16 | Disposition: A | Discharge status: Home / Self Care | Payer: Medicare Other | Source: Ambulatory Visit | Attending: Anesthesiology | Admitting: Anesthesiology

## 2016-08-16 ENCOUNTER — Encounter (HOSPITAL_COMMUNITY): Payer: Self-pay

## 2016-08-16 ENCOUNTER — Encounter (HOSPITAL_COMMUNITY)
Admission: RE | Admit: 2016-08-16 | Discharge: 2016-08-16 | Disposition: A | Discharge status: Home / Self Care | Payer: Medicare Other | Source: Ambulatory Visit | Attending: General Surgery | Admitting: General Surgery

## 2016-08-16 DIAGNOSIS — Z01818 Encounter for other preprocedural examination: Secondary | ICD-10-CM | POA: Diagnosis not present

## 2016-08-16 DIAGNOSIS — Z01812 Encounter for preprocedural laboratory examination: Secondary | ICD-10-CM | POA: Diagnosis not present

## 2016-08-16 DIAGNOSIS — K449 Diaphragmatic hernia without obstruction or gangrene: Secondary | ICD-10-CM | POA: Insufficient documentation

## 2016-08-16 DIAGNOSIS — Z0183 Encounter for blood typing: Secondary | ICD-10-CM | POA: Diagnosis not present

## 2016-08-16 DIAGNOSIS — I2584 Coronary atherosclerosis due to calcified coronary lesion: Secondary | ICD-10-CM | POA: Diagnosis not present

## 2016-08-16 HISTORY — DX: Cerebral infarction, unspecified: I63.9

## 2016-08-16 LAB — CBC
HCT: 38.5 % (ref 36.0–46.0)
Hemoglobin: 12.4 g/dL (ref 12.0–15.0)
MCH: 27.9 pg (ref 26.0–34.0)
MCHC: 32.2 g/dL (ref 30.0–36.0)
MCV: 86.7 fL (ref 78.0–100.0)
PLATELETS: 370 10*3/uL (ref 150–400)
RBC: 4.44 MIL/uL (ref 3.87–5.11)
RDW: 14.8 % (ref 11.5–15.5)
WBC: 8.2 10*3/uL (ref 4.0–10.5)

## 2016-08-16 LAB — BASIC METABOLIC PANEL
Anion gap: 10 (ref 5–15)
BUN: 15 mg/dL (ref 6–20)
CALCIUM: 9.6 mg/dL (ref 8.9–10.3)
CO2: 27 mmol/L (ref 22–32)
CREATININE: 1.14 mg/dL — AB (ref 0.44–1.00)
Chloride: 103 mmol/L (ref 101–111)
GFR calc Af Amer: 50 mL/min — ABNORMAL LOW (ref >60)
GFR, EST NON AFRICAN AMERICAN: 43 mL/min — AB (ref >60)
GLUCOSE: 105 mg/dL — AB (ref 65–99)
POTASSIUM: 4.7 mmol/L (ref 3.5–5.1)
SODIUM: 140 mmol/L (ref 135–145)

## 2016-08-16 LAB — ABO/RH: ABO/RH(D): O NEG

## 2016-08-16 NOTE — Progress Notes (Signed)
At time of preop appt patient had not received Eliquis instructions per patient.  Called office of [facility] and spoke with Triage, Jansen, and Shamira gave patient Eliquis preop instructions via phone.  Patient stated her last dose of Eliquis is to be 08/17/2016 in the am.

## 2016-08-16 NOTE — Progress Notes (Signed)
Clearance- 02/27/17- medical clearance- telephone note 07/26/2016- Cardiology LOV- epic  07/26/2016- EKG- Epic  ECHO-06/28/16-epic

## 2016-08-19 NOTE — Anesthesia Preprocedure Evaluation (Addendum)
Anesthesia Evaluation  Patient identified by MRN, date of birth, ID band Patient awake    Reviewed: Allergy & Precautions, H&P , Patient's Chart, lab work & pertinent test results, reviewed documented beta blocker date and time   Airway Mallampati: II  TM Distance: >3 FB Neck ROM: full    Dental no notable dental hx.    Pulmonary    Pulmonary exam normal breath sounds clear to auscultation       Cardiovascular hypertension,  Rhythm:regular Rate:Normal     Neuro/Psych    GI/Hepatic   Endo/Other    Renal/GU      Musculoskeletal   Abdominal   Peds  Hematology   Anesthesia Other Findings   atrial fibrillation Hx; s/p cardioversion now in SR  Stroke  2014 hx of mini stroke ( TIA ) 50% EF        Reproductive/Obstetrics                             Anesthesia Physical Anesthesia Plan  ASA: III  Anesthesia Plan: General   Post-op Pain Management:    Induction: Intravenous  Airway Management Planned: Oral ETT  Additional Equipment:   Intra-op Plan:   Post-operative Plan: Extubation in OR  Informed Consent: I have reviewed the patients History and Physical, chart, labs and discussed the procedure including the risks, benefits and alternatives for the proposed anesthesia with the patient or authorized representative who has indicated his/her understanding and acceptance.   Dental Advisory Given  Plan Discussed with: CRNA and Surgeon  Anesthesia Plan Comments: (  )        Anesthesia Quick Evaluation

## 2016-08-20 ENCOUNTER — Inpatient Hospital Stay (HOSPITAL_COMMUNITY): Payer: Medicare Other | Admitting: Anesthesiology

## 2016-08-20 ENCOUNTER — Inpatient Hospital Stay (HOSPITAL_COMMUNITY)
Admission: RE | Admit: 2016-08-20 | Discharge: 2016-08-22 | DRG: 328 | Disposition: A | Discharge status: Home / Self Care | Payer: Medicare Other | Source: Ambulatory Visit | Attending: General Surgery | Admitting: General Surgery

## 2016-08-20 ENCOUNTER — Encounter (HOSPITAL_COMMUNITY)
Admission: RE | Disposition: A | Discharge status: Home / Self Care | Payer: Self-pay | Source: Ambulatory Visit | Attending: General Surgery

## 2016-08-20 ENCOUNTER — Encounter (HOSPITAL_COMMUNITY): Payer: Self-pay | Admitting: *Deleted

## 2016-08-20 DIAGNOSIS — Z7901 Long term (current) use of anticoagulants: Secondary | ICD-10-CM

## 2016-08-20 DIAGNOSIS — E785 Hyperlipidemia, unspecified: Secondary | ICD-10-CM | POA: Diagnosis not present

## 2016-08-20 DIAGNOSIS — I10 Essential (primary) hypertension: Secondary | ICD-10-CM | POA: Diagnosis not present

## 2016-08-20 DIAGNOSIS — Z79899 Other long term (current) drug therapy: Secondary | ICD-10-CM

## 2016-08-20 DIAGNOSIS — I481 Persistent atrial fibrillation: Secondary | ICD-10-CM | POA: Diagnosis not present

## 2016-08-20 DIAGNOSIS — K319 Disease of stomach and duodenum, unspecified: Secondary | ICD-10-CM | POA: Diagnosis not present

## 2016-08-20 DIAGNOSIS — K449 Diaphragmatic hernia without obstruction or gangrene: Secondary | ICD-10-CM | POA: Diagnosis not present

## 2016-08-20 DIAGNOSIS — Z9889 Other specified postprocedural states: Secondary | ICD-10-CM | POA: Diagnosis not present

## 2016-08-20 DIAGNOSIS — Z8673 Personal history of transient ischemic attack (TIA), and cerebral infarction without residual deficits: Secondary | ICD-10-CM

## 2016-08-20 HISTORY — PX: INSERTION OF MESH: SHX5868

## 2016-08-20 HISTORY — PX: OTHER SURGICAL HISTORY: SHX169

## 2016-08-20 LAB — TYPE AND SCREEN
ABO/RH(D): O NEG
Antibody Screen: NEGATIVE

## 2016-08-20 SURGERY — FUNDOPLICATION, NISSEN, ROBOT-ASSISTED, LAPAROSCOPIC
Anesthesia: General

## 2016-08-20 MED ORDER — ONDANSETRON HCL 4 MG/2ML IJ SOLN
INTRAMUSCULAR | Status: AC
  Filled 2016-08-20: qty 2

## 2016-08-20 MED ORDER — SUCCINYLCHOLINE CHLORIDE 200 MG/10ML IV SOSY
PREFILLED_SYRINGE | INTRAVENOUS | Status: DC | PRN
  Administered 2016-08-20: 120 mg via INTRAVENOUS

## 2016-08-20 MED ORDER — GLYCOPYRROLATE 0.2 MG/ML IV SOSY
PREFILLED_SYRINGE | INTRAVENOUS | Status: AC
  Filled 2016-08-20: qty 5

## 2016-08-20 MED ORDER — BUPIVACAINE HCL (PF) 0.25 % IJ SOLN
INTRAMUSCULAR | Status: AC
  Filled 2016-08-20: qty 30

## 2016-08-20 MED ORDER — GLYCOPYRROLATE 0.2 MG/ML IJ SOLN
INTRAMUSCULAR | Status: DC | PRN
  Administered 2016-08-20: 0.6 mg via INTRAVENOUS

## 2016-08-20 MED ORDER — ROCURONIUM BROMIDE 50 MG/5ML IV SOSY
PREFILLED_SYRINGE | INTRAVENOUS | Status: AC
  Filled 2016-08-20: qty 5

## 2016-08-20 MED ORDER — PHENYLEPHRINE HCL 10 MG/ML IJ SOLN
INTRAVENOUS | Status: DC | PRN
  Administered 2016-08-20: 15 ug/min via INTRAVENOUS

## 2016-08-20 MED ORDER — ONDANSETRON 4 MG PO TBDP: 4.0000 mg | ORAL_TABLET | Freq: Four times a day (QID) | ORAL | Status: DC | PRN

## 2016-08-20 MED ORDER — ONDANSETRON HCL 4 MG/2ML IJ SOLN
INTRAMUSCULAR | Status: DC | PRN
  Administered 2016-08-20: 4 mg via INTRAVENOUS

## 2016-08-20 MED ORDER — FENTANYL CITRATE (PF) 100 MCG/2ML IJ SOLN
INTRAMUSCULAR | Status: AC
  Filled 2016-08-20: qty 2

## 2016-08-20 MED ORDER — SUGAMMADEX SODIUM 200 MG/2ML IV SOLN
INTRAVENOUS | Status: AC
  Filled 2016-08-20: qty 2

## 2016-08-20 MED ORDER — PROPOFOL 10 MG/ML IV BOLUS
INTRAVENOUS | Status: DC | PRN
  Administered 2016-08-20: 140 mg via INTRAVENOUS

## 2016-08-20 MED ORDER — HYDROMORPHONE HCL 1 MG/ML IJ SOLN
1.0000 mg | INTRAMUSCULAR | Status: DC | PRN
  Administered 2016-08-20 – 2016-08-21 (×3): 1 mg via INTRAVENOUS
  Filled 2016-08-20 (×3): qty 1

## 2016-08-20 MED ORDER — DEXAMETHASONE SODIUM PHOSPHATE 10 MG/ML IJ SOLN
INTRAMUSCULAR | Status: DC | PRN
  Administered 2016-08-20: 10 mg via INTRAVENOUS

## 2016-08-20 MED ORDER — MEPERIDINE HCL 50 MG/ML IJ SOLN: 6.2500 mg | INTRAMUSCULAR | Status: DC | PRN

## 2016-08-20 MED ORDER — PHENYLEPHRINE 40 MCG/ML (10ML) SYRINGE FOR IV PUSH (FOR BLOOD PRESSURE SUPPORT)
PREFILLED_SYRINGE | INTRAVENOUS | Status: DC | PRN
  Administered 2016-08-20: 240 ug via INTRAVENOUS

## 2016-08-20 MED ORDER — CEFAZOLIN SODIUM-DEXTROSE 2-4 GM/100ML-% IV SOLN
INTRAVENOUS | Status: AC
Start: 2016-08-20 — End: 2016-08-20
  Filled 2016-08-20: qty 100

## 2016-08-20 MED ORDER — FENTANYL CITRATE (PF) 100 MCG/2ML IJ SOLN
INTRAMUSCULAR | Status: DC | PRN
  Administered 2016-08-20 (×4): 50 ug via INTRAVENOUS

## 2016-08-20 MED ORDER — LIDOCAINE 2% (20 MG/ML) 5 ML SYRINGE
INTRAMUSCULAR | Status: DC | PRN
  Administered 2016-08-20: 100 mg via INTRAVENOUS

## 2016-08-20 MED ORDER — LIDOCAINE 2% (20 MG/ML) 5 ML SYRINGE
INTRAMUSCULAR | Status: AC
  Filled 2016-08-20: qty 5

## 2016-08-20 MED ORDER — ENOXAPARIN SODIUM 40 MG/0.4ML ~~LOC~~ SOLN
40.0000 mg | SUBCUTANEOUS | Status: DC
  Administered 2016-08-21 – 2016-08-22 (×2): 40 mg via SUBCUTANEOUS
  Filled 2016-08-20 (×2): qty 0.4

## 2016-08-20 MED ORDER — FENTANYL CITRATE (PF) 100 MCG/2ML IJ SOLN: 25.0000 ug | INTRAMUSCULAR | Status: DC | PRN

## 2016-08-20 MED ORDER — BUPIVACAINE HCL (PF) 0.25 % IJ SOLN
INTRAMUSCULAR | Status: DC | PRN
Start: 2016-08-20 — End: 2016-08-20
  Administered 2016-08-20: 15 mL

## 2016-08-20 MED ORDER — SUGAMMADEX SODIUM 200 MG/2ML IV SOLN
INTRAVENOUS | Status: DC | PRN
  Administered 2016-08-20: 200 mg via INTRAVENOUS

## 2016-08-20 MED ORDER — LACTATED RINGERS IR SOLN
Status: DC | PRN
  Administered 2016-08-20: 1000 mL

## 2016-08-20 MED ORDER — PHENYLEPHRINE HCL 10 MG/ML IJ SOLN
INTRAMUSCULAR | Status: AC
  Filled 2016-08-20: qty 1

## 2016-08-20 MED ORDER — ROCURONIUM BROMIDE 10 MG/ML (PF) SYRINGE
PREFILLED_SYRINGE | INTRAVENOUS | Status: DC | PRN
  Administered 2016-08-20: 20 mg via INTRAVENOUS
  Administered 2016-08-20: 50 mg via INTRAVENOUS
  Administered 2016-08-20: 10 mg via INTRAVENOUS
  Administered 2016-08-20: 20 mg via INTRAVENOUS

## 2016-08-20 MED ORDER — DEXAMETHASONE SODIUM PHOSPHATE 10 MG/ML IJ SOLN
INTRAMUSCULAR | Status: AC
  Filled 2016-08-20: qty 1

## 2016-08-20 MED ORDER — LACTATED RINGERS IV SOLN
INTRAVENOUS | Status: DC | PRN
  Administered 2016-08-20 (×2): via INTRAVENOUS

## 2016-08-20 MED ORDER — PROPOFOL 10 MG/ML IV BOLUS
INTRAVENOUS | Status: AC
  Filled 2016-08-20: qty 20

## 2016-08-20 MED ORDER — STERILE WATER FOR IRRIGATION IR SOLN
Status: DC | PRN
  Administered 2016-08-20: 1000 mL

## 2016-08-20 MED ORDER — ONDANSETRON HCL 4 MG/2ML IJ SOLN: 4.0000 mg | Freq: Once | INTRAMUSCULAR | Status: DC | PRN

## 2016-08-20 MED ORDER — SUCCINYLCHOLINE CHLORIDE 200 MG/10ML IV SOSY
PREFILLED_SYRINGE | INTRAVENOUS | Status: AC
  Filled 2016-08-20: qty 10

## 2016-08-20 MED ORDER — DEXTROSE-NACL 5-0.9 % IV SOLN
INTRAVENOUS | Status: DC
  Administered 2016-08-20: 12:00:00 via INTRAVENOUS
  Administered 2016-08-20: 1000 mL via INTRAVENOUS
  Administered 2016-08-22: 02:00:00 via INTRAVENOUS

## 2016-08-20 MED ORDER — ONDANSETRON HCL 4 MG/2ML IJ SOLN: 4.0000 mg | Freq: Four times a day (QID) | INTRAMUSCULAR | Status: DC | PRN

## 2016-08-20 MED ORDER — CEFAZOLIN SODIUM-DEXTROSE 2-4 GM/100ML-% IV SOLN
2.0000 g | INTRAVENOUS | Status: AC
  Administered 2016-08-20: 2 g via INTRAVENOUS

## 2016-08-20 SURGICAL SUPPLY — 64 items
ADH SKN CLS APL DERMABOND .7 (GAUZE/BANDAGES/DRESSINGS) ×1
APPLIER CLIP 5 13 M/L LIGAMAX5 (MISCELLANEOUS)
APPLIER CLIP ROT 10 11.4 M/L (STAPLE)
APR CLP MED LRG 11.4X10 (STAPLE)
APR CLP MED LRG 5 ANG JAW (MISCELLANEOUS)
BLADE SURG SZ11 CARB STEEL (BLADE) ×3 IMPLANT
CHLORAPREP W/TINT 26ML (MISCELLANEOUS) ×3 IMPLANT
CLIP APPLIE 5 13 M/L LIGAMAX5 (MISCELLANEOUS) IMPLANT
CLIP APPLIE ROT 10 11.4 M/L (STAPLE) IMPLANT
CLIP LIGATING HEM O LOK PURPLE (MISCELLANEOUS) IMPLANT
CLIP LIGATING HEMO O LOK GREEN (MISCELLANEOUS) IMPLANT
COVER SURGICAL LIGHT HANDLE (MISCELLANEOUS) ×3 IMPLANT
COVER TIP SHEARS 8 DVNC (MISCELLANEOUS) IMPLANT
COVER TIP SHEARS 8MM DA VINCI (MISCELLANEOUS) ×2
DECANTER SPIKE VIAL GLASS SM (MISCELLANEOUS) ×3 IMPLANT
DERMABOND ADVANCED (GAUZE/BANDAGES/DRESSINGS) ×2
DERMABOND ADVANCED .7 DNX12 (GAUZE/BANDAGES/DRESSINGS) IMPLANT
DEVICE TROCAR PUNCTURE CLOSURE (ENDOMECHANICALS) IMPLANT
DRAIN PENROSE 18X1/2 LTX STRL (DRAIN) ×3 IMPLANT
DRAPE ARM DVNC X/XI (DISPOSABLE) ×4 IMPLANT
DRAPE COLUMN DVNC XI (DISPOSABLE) ×1 IMPLANT
DRAPE DA VINCI XI ARM (DISPOSABLE) ×8
DRAPE DA VINCI XI COLUMN (DISPOSABLE) ×2
DRAPE SHEET LG 3/4 BI-LAMINATE (DRAPES) IMPLANT
ELECT REM PT RETURN 15FT ADLT (MISCELLANEOUS) ×3 IMPLANT
ENDOLOOP SUT PDS II  0 18 (SUTURE)
ENDOLOOP SUT PDS II 0 18 (SUTURE) IMPLANT
GAUZE SPONGE 4X4 16PLY XRAY LF (GAUZE/BANDAGES/DRESSINGS) ×3 IMPLANT
GLOVE BIO SURGEON STRL SZ7.5 (GLOVE) ×6 IMPLANT
GOWN STRL REUS W/TWL XL LVL3 (GOWN DISPOSABLE) ×12 IMPLANT
KIT BASIN OR (CUSTOM PROCEDURE TRAY) ×3 IMPLANT
MARKER SKIN DUAL TIP RULER LAB (MISCELLANEOUS) ×3 IMPLANT
MESH BIO-A 7X10 SYN MAT (Mesh General) ×2 IMPLANT
NDL INSUFFLATION 14GA 120MM (NEEDLE) ×1 IMPLANT
NEEDLE HYPO 22GX1.5 SAFETY (NEEDLE) ×3 IMPLANT
NEEDLE INSUFFLATION 14GA 120MM (NEEDLE) ×3 IMPLANT
OBTURATOR OPTICAL STANDARD 8MM (TROCAR)
OBTURATOR OPTICAL STND 8 DVNC (TROCAR)
OBTURATOR OPTICALSTD 8 DVNC (TROCAR) IMPLANT
PACK CARDIOVASCULAR III (CUSTOM PROCEDURE TRAY) ×3 IMPLANT
PAD POSITIONING PINK XL (MISCELLANEOUS) ×3 IMPLANT
SCISSORS LAP 5X35 DISP (ENDOMECHANICALS) ×3 IMPLANT
SEAL CANN UNIV 5-8 DVNC XI (MISCELLANEOUS) ×4 IMPLANT
SEAL XI 5MM-8MM UNIVERSAL (MISCELLANEOUS) ×8
SEALER VESSEL DA VINCI XI (MISCELLANEOUS) ×2
SEALER VESSEL EXT DVNC XI (MISCELLANEOUS) ×1 IMPLANT
SET BI-LUMEN FLTR TB AIRSEAL (TUBING) ×3 IMPLANT
SET IRRIG TUBING LAPAROSCOPIC (IRRIGATION / IRRIGATOR) ×2 IMPLANT
SLEEVE XCEL OPT CAN 5 100 (ENDOMECHANICALS) IMPLANT
SOLUTION ANTI FOG 6CC (MISCELLANEOUS) ×3 IMPLANT
SOLUTION ELECTROLUBE (MISCELLANEOUS) ×3 IMPLANT
STAPLER VISISTAT 35W (STAPLE) IMPLANT
SUT ETHIBOND 0 36 GRN (SUTURE) ×6 IMPLANT
SUT MNCRL AB 4-0 PS2 18 (SUTURE) ×3 IMPLANT
SUT SILK 0 SH 30 (SUTURE) ×5 IMPLANT
SUT SILK 2 0 SH (SUTURE) ×6 IMPLANT
SYR 10ML LL (SYRINGE) ×3 IMPLANT
SYR 20CC LL (SYRINGE) ×3 IMPLANT
TOWEL OR 17X26 10 PK STRL BLUE (TOWEL DISPOSABLE) ×3 IMPLANT
TOWEL OR NON WOVEN STRL DISP B (DISPOSABLE) ×5 IMPLANT
TRAY FOLEY CATH SILVER 14FR (SET/KITS/TRAYS/PACK) ×2 IMPLANT
TROCAR ADV FIXATION 5X100MM (TROCAR) ×3 IMPLANT
TUBING CONNECTING 10 (TUBING) ×2 IMPLANT
TUBING CONNECTING 10' (TUBING) ×1

## 2016-08-20 NOTE — Transfer of Care (Signed)
Immediate Anesthesia Transfer of Care Note  Patient: [name]  Procedure(s) Performed: Procedure(s): XI ROBOTIC HIATIAL HERNIA AND  NISSEN FUNDOPLICATION (N/A) INSERTION OF MESH (N/A)  Patient Location: PACU  Anesthesia Type:General  Level of Consciousness: sedated  Airway & Oxygen Therapy: Patient Spontanous Breathing and Patient connected to face mask oxygen  Post-op Assessment: Report given to RN and Post -op Vital signs reviewed and stable  Post vital signs: Reviewed and stable  Last Vitals:  Vitals:   08/20/16 0521  BP: (P) 133/60  Pulse: (P) 68  Resp: (P) 18  Temp: (P) 36.5 C    Last Pain:  Vitals:   08/20/16 0521  TempSrc: (P) Oral  PainSc: (P) 0-No pain         Complications: No apparent anesthesia complications

## 2016-08-20 NOTE — Anesthesia Procedure Notes (Signed)
Procedure Name: Intubation Date/Time: 08/20/2016 7:19 AM Performed by: Lind Covert Pre-anesthesia Checklist: Patient identified, Emergency Drugs available, Suction available, Patient being monitored and Timeout performed Patient Re-evaluated:Patient Re-evaluated prior to inductionOxygen Delivery Method: Circle system utilized Preoxygenation: Pre-oxygenation with 100% oxygen Intubation Type: IV induction Laryngoscope Size: Mac and 3 Grade View: Grade I Tube type: Oral Tube size: 7.0 mm Number of attempts: 1 Airway Equipment and Method: Stylet Secured at: 21 cm Tube secured with: Tape Dental Injury: Teeth and Oropharynx as per pre-operative assessment

## 2016-08-20 NOTE — Anesthesia Postprocedure Evaluation (Signed)
Anesthesia Post Note  Patient: [name]  Procedure(s) Performed: Procedure(s) (LRB): XI ROBOTIC HIATIAL HERNIA AND  NISSEN FUNDOPLICATION (N/A) INSERTION OF MESH (N/A)  Patient location during evaluation: PACU Anesthesia Type: General Level of consciousness: awake and alert Pain management: pain level controlled Vital Signs Assessment: post-procedure vital signs reviewed and stable Respiratory status: spontaneous breathing, nonlabored ventilation, respiratory function stable and patient connected to nasal cannula oxygen Cardiovascular status: blood pressure returned to baseline and stable Postop Assessment: no signs of nausea or vomiting Anesthetic complications: no       Last Vitals:  Vitals:   08/20/16 1100 08/20/16 1120  BP: (!) 152/72 (!) 166/63  Pulse: 66 64  Resp: 16 17  Temp: 36.5 C 36.3 C    Last Pain:  Vitals:   08/20/16 1030  TempSrc:   PainSc: Asleep                 [name]

## 2016-08-20 NOTE — Interval H&P Note (Signed)
History and Physical Interval Note:  08/20/2016 6:59 AM  [name]  has presented today for surgery, with the diagnosis of large hiatal hernia  The various methods of treatment have been discussed with the patient and family. After consideration of risks, benefits and other options for treatment, the patient has consented to  Procedure(s): XI ROBOTIC HIATIAL HERNIA AND  NISSEN FUNDOPLICATION (N/A) INSERTION OF MESH (N/A) as a surgical intervention .  The patient's history has been reviewed, patient examined, no change in status, stable for surgery.  I have reviewed the patient's chart and labs.  Questions were answered to the patient's satisfaction.     Rosario Jacks., Anne Hahn

## 2016-08-20 NOTE — H&P (View-Only) (Signed)
History of Present Illness Katrina Ok MD; 07/22/2016 11:32 AM) The patient is a 83 year old female who presents with a hiatal hernia. Patient is a 83 year old female who was recently seen at [facility] recently secondary to pneumonia and a possible gastric volvulus with large hiatal hernia. Patient states that the night prior to admission she had a large meal and had some nausea and vomiting. She states this is the first recurrence had no previous episodes similar to this. Patient was being treated for pneumonia prior to this.  Patient underwent CT scan which revealed a large hiatal hernia. Patient also had by mouth contrast which revealed no gastric volvulus and no emergent surgery was required.  Patient has history of A. fib and sees Dr. [name]. She is currently on Elliquis, metoprolol, diltiazem. Patient is to see him on Monday to follow up from her hospitalization.   Allergies Erline Levine, RN; 07/22/2016 11:03 AM) Lisinopril *ANTIHYPERTENSIVES*   Medication History Erline Levine, RN; 07/22/2016 11:01 AM) DilTIAZem HCl ER Coated Beads (120MG  Capsule ER 24HR, Oral) Active. Boostrix (5-2.5-18.5 Suspension, Intramuscular) Active. Furosemide (40MG  Tablet, Oral) Active. Metoprolol Succinate ER (50MG  Tablet ER 24HR, Oral) Active. Metoprolol Tartrate (25MG  Tablet, Oral) Active. Pantoprazole Sodium (40MG  Tablet DR, Oral) Active. Ondansetron HCl (4MG  Tablet, Oral) Active. Pravastatin Sodium (40MG  Tablet, Oral) Active. Eliquis (5MG  Tablet, Oral) Active. Florastor (250MG  Capsule, Oral) Active. Medications Reconciled  Vitals Erline Levine RN; 07/22/2016 10:58 AM) 07/22/2016 10:58 AM Weight: 159.19 lb Height: 66in Height was reported by patient. Body Surface Area: 1.82 m Body Mass Index: 25.69 kg/m  Temp.: 98.22F(Oral)  Pulse: 72 (Regular)  P.OX: 95% (Room air) BP: 152/76 (Sitting, Left Arm, Standard)       Physical Exam Katrina Ok MD; 07/22/2016  11:32 AM) General Mental Status-Alert. General Appearance-Consistent with stated age. Hydration-Well hydrated. Voice-Normal.  Chest and Lung Exam Chest and lung exam reveals -quiet, even and easy respiratory effort with no use of accessory muscles and on auscultation, normal breath sounds, no adventitious sounds and normal vocal resonance. Inspection Chest Wall - Normal. Back - normal.  Cardiovascular Cardiovascular examination reveals -normal heart sounds, regular rate and rhythm with no murmurs and normal pedal pulses bilaterally.  Abdomen Inspection Inspection of the abdomen reveals - No Hernias. Skin - Scar - no surgical scars. Palpation/Percussion Palpation and Percussion of the abdomen reveal - Soft, Non Tender, No Rebound tenderness, No Rigidity (guarding) and No hepatosplenomegaly. Auscultation Auscultation of the abdomen reveals - Bowel sounds normal.  Neurologic Neurologic evaluation reveals -alert and oriented x 3 with no impairment of recent or remote memory. Mental Status-Normal.  Musculoskeletal Normal Exam - Left-Upper Extremity Strength Normal and Lower Extremity Strength Normal. Normal Exam - Right-Upper Extremity Strength Normal and Lower Extremity Strength Normal.    Assessment & Plan Katrina Ok MD; 07/22/2016 11:34 AM) LARGE HIATAL HERNIA (K44.9) Impression: Patient is an 83 year old female with a large type IV hiatal hernia.  1. The patient will require a robotic had hernia repair and Nissen fundoplication. I discussed this at length with the patient and her husband. 2. I discussed with her the risks and benefits the procedure to include but not limited to: Infection, bleeding, damage to structures, possible need for further surgery, possible pneumothorax. Patient was understanding and wished to proceed 3. Patient will require cardiac evaluation prior to scheduling surgery by Dr. [name] and his team. Patient is currently on Elliquis  will need to be off of his prior surgery.

## 2016-08-20 NOTE — Progress Notes (Signed)
Patient arrived to floor in stable condition, VS stable. Denies pain/nausea.  [name]. Brigitte Pulse, RN

## 2016-08-20 NOTE — Op Note (Signed)
08/20/2016  10:07 AM  PATIENT:  [name]  83 y.o. female  PRE-OPERATIVE DIAGNOSIS:  large hiatal hernia  POST-OPERATIVE DIAGNOSIS:  large hiatal hernia  PROCEDURE:  Procedure(s): XI ROBOTIC HIATIAL HERNIA AND  NISSEN FUNDOPLICATION (N/A) INSERTION OF MESH (N/A)  SURGEON:  Surgeon(s) and Role:    * Ralene Ok, MD - Primary    * Mickeal Skinner, MD - Assisting  ANESTHESIA:   local and general  EBL:  Total I/O In: 1000 [I.V.:1000] Out: 85 [Urine:75; Blood:10]  BLOOD ADMINISTERED:none  DRAINS: none   LOCAL MEDICATIONS USED:  BUPIVICAINE   SPECIMEN:  No Specimen  DISPOSITION OF SPECIMEN:  N/A  COUNTS:  YES  TOURNIQUET:  * No tourniquets in log *  DICTATION: .Dragon Dictation The patient was taken back to the operating room and placed in the supine position with bilateral SCDs in place. The patient was prepped and draped in the usual sterile fashion. After appropriate antibiotics were confirmed a timeout was called and all facts were verified.   A Veress needle technique was used to insufflate the abdomen to 15 mm of mercury the paramedian stab incision. Subsequent to this an 8 mm trocar was introduced as was a 8 millimeter camera. At this time the subsequent robotic trochars x3, were then placed adjacent to this trocar approximately 8-10 cm away. Each trocar was inserted under direct visualization, there were total of 4 trochars. The assistant trocar was then placed in the right lower quadrant under direct visualization. The Nathanson retractor was then visualized inserted into the abdomen and the incision just to the left of the falciform ligament. This was then placed to retract the liver appropriately. At this time the patient was positioned in reverse Trendelenburg.   At this time the robot patient cart was brought to the bedside and placed in good position and the arms were docked to the trochars appropriately. The hiatus was visualized and there was seen to be  a large hiatal hernia with prolapsed stomach.    At this time I proceeded to incised the gastrohepatic ligament.  At this time I proceeded to mobilize the stomach inferiorly and visualize the right crus. The peritoneum over the right crus was incised and right crus was identified. I proceeded to dissect this inferiorly until the left crus was seen joining the right crus. Once the right crus was adequately dissected we turned our to the left crus which was dissected away. This required traction of the stomach to the right side. Once this was visualized we then proceeded to circumferentially dissect the esophagus away from the surrounding tissue. At this time a Penrose drain was placed around the esophagus to help with retraction. At this time the phrenoesophageal fat pad was dissected away from the esophagus. There was a 5cm hiatal hernia seen. I mobilized the esophagus cephalad approximately 5-6 cm, clearing away the surrounding tissue.   At this time we turned our attention to the greater curvature the stomach and the omentum was mobilized using the robotic vessel sealer. This was taken up to the greater curvature to the hiatus. This mobilized the entire greater curvature to allow mobilization and the wrap. I then proceeded to bring the greater curvature the stomach posterior to the esophagus, and a shoeshine technique was used to evaluate the mobilization of the greater curvature.   At this time I proceeded to close the hiatus using 3 interrupted 0 Ethibond stitches in figure of eight fashion. This brought together the hiatal closure without  undue stricture to the esophagus.   A piece of Bio-A hiatal hernia patch mesh was placed over the hiatal closure and sutured to the crus using 2-0 silk sutures x 3.  At this time the greater curvature was brought around the esophagus and sutured using 2-0 silk sutures interrupted fashion approximately 1 cm apart x3. The middle suture was sutured to the esophagus. A  left collar stitch was then used to gastropexy the stomach from the wrap to the diaphragm just lateral to the left crus as.  A second collar stitch was placed from the wrap to the right crus. The wrap lay at approximately 11:00 on its own with undue tension.  The wrap lay loose with no strangulation of the esophagus.  At this time the robot was undocked. The liver trocar was removed. At this time insufflation was evacuated. Skin was reapproximated for Monocryl subcuticular fashion. The skin was then dressed with Dermabond. The patient tolerated the procedure well and was taken to the recovery room in stable condition.   PLAN OF CARE: Admit for overnight observation  PATIENT DISPOSITION:  PACU - hemodynamically stable.   Delay start of Pharmacological VTE agent (>24hrs) due to surgical blood loss or risk of bleeding: no

## 2016-08-20 NOTE — Progress Notes (Signed)
Report given to [facility], Therapist, sports.  Barbee Shropshire. Brigitte Pulse, RN

## 2016-08-20 NOTE — Progress Notes (Signed)
Initial Nutrition Assessment  DOCUMENTATION CODES:   Not applicable  INTERVENTION:  - Diet advancement as medically feasible. - RD will monitor for additional needs.  NUTRITION DIAGNOSIS:   Inadequate oral intake related to inability to eat as evidenced by NPO status.  GOAL:   Patient will meet greater than or equal to 90% of their needs  MONITOR:   Diet advancement, PO intake, Weight trends, Labs, Skin  REASON FOR ASSESSMENT:   Malnutrition Screening Tool, Consult Diet education  ASSESSMENT:   83 year-old admitted 5/4 following Nissen fundoplication for large hiatal hernia.  Pt seen for MST and diet education. BMI indicates overweight status, appropriate for age. Pt has been NPO since admission. She reports that over the past 6-8 weeks she was experiencing N/V with some PO intakes and, because of this, was mainly eating very soft foods and drinking Ensure mixed with ice cream at least once/day. If she was unable to consume a meal she would replace it with Ensure. Pt states that she would puree some foods if items were too large (for example: she pureed a serving of chicken salad because chicken pieces were too large for her to tolerate). Pt reports living at [facility] and that she is able to request specific food items and she also has access to a blender.   Physical assessment shows no muscle and no fat wasting. Per review, pt has lost 5 lbs (3% body weight) in the past 1 month which is not significant for time frame.  Provided pt with handout from the [employer] outlining appropriate and inappropriate foods while on a pureed diet which consult indicates should be followed for 2 weeks; pt and family, at bedside, asked questions and also expressed understanding.  Medications reviewed. Labs reviewed; creatinine: 1.14 mg/dL, GFR: 43 mL/min.   IVF: D5-NS @ 100 mL/hr (408 kcal from dextrose).   Diet Order:  Diet NPO time specified  Skin:  Wound (see  comment) (Abdominal incision from 08/20/16)  Last BM:  PTA/unknown  Height:   Ht Readings from Last 1 Encounters:  08/20/16 5\' 6"  (1.676 m)    Weight:   Wt Readings from Last 1 Encounters:  08/20/16 155 lb (70.3 kg)    Ideal Body Weight:  59.09 kg  BMI:  Body mass index is 25.02 kg/m.  Estimated Nutritional Needs:   Kcal:  1545-1760 (22-25 kcal/kg)  Protein:  60-70 grams  Fluid:  >/= 1.7 L/day  EDUCATION NEEDS:   Education needs addressed    Jarome Matin, MS, RD, LDN, CNSC Inpatient Clinical Dietitian Pager # [phone] After hours/weekend pager # [phone]

## 2016-08-21 ENCOUNTER — Inpatient Hospital Stay (HOSPITAL_COMMUNITY): Payer: Medicare Other

## 2016-08-21 LAB — CBC
HCT: 35 % — ABNORMAL LOW (ref 36.0–46.0)
HEMATOCRIT: 36.7 % (ref 36.0–46.0)
HEMOGLOBIN: 11 g/dL — AB (ref 12.0–15.0)
HEMOGLOBIN: 11.8 g/dL — AB (ref 12.0–15.0)
MCH: 27.7 pg (ref 26.0–34.0)
MCH: 27.8 pg (ref 26.0–34.0)
MCHC: 31.4 g/dL (ref 30.0–36.0)
MCHC: 32.2 g/dL (ref 30.0–36.0)
MCV: 88.2 fL (ref 78.0–100.0)
MCV: 86.6 fL (ref 78.0–100.0)
Platelets: 316 10*3/uL (ref 150–400)
Platelets: 337 10*3/uL (ref 150–400)
RBC: 3.97 MIL/uL (ref 3.87–5.11)
RBC: 4.24 MIL/uL (ref 3.87–5.11)
RDW: 15.2 % (ref 11.5–15.5)
RDW: 15.1 % (ref 11.5–15.5)
WBC: 12.4 10*3/uL — AB (ref 4.0–10.5)
WBC: 13.7 10*3/uL — ABNORMAL HIGH (ref 4.0–10.5)

## 2016-08-21 LAB — BASIC METABOLIC PANEL
ANION GAP: 8 (ref 5–15)
BUN: 12 mg/dL (ref 6–20)
CHLORIDE: 107 mmol/L (ref 101–111)
CO2: 24 mmol/L (ref 22–32)
Calcium: 8.8 mg/dL — ABNORMAL LOW (ref 8.9–10.3)
Creatinine, Ser: 0.86 mg/dL (ref 0.44–1.00)
GFR calc non Af Amer: >60 mL/min (ref >60)
Glucose, Bld: 165 mg/dL — ABNORMAL HIGH (ref 65–99)
POTASSIUM: 4.4 mmol/L (ref 3.5–5.1)
SODIUM: 139 mmol/L (ref 135–145)

## 2016-08-21 MED ORDER — OXYCODONE HCL 5 MG/5ML PO SOLN
5.0000 mg | ORAL | Status: DC | PRN
  Administered 2016-08-21: 10 mg via ORAL
  Filled 2016-08-21: qty 10

## 2016-08-21 MED ORDER — IOPAMIDOL (ISOVUE-300) INJECTION 61%: 100.0000 mL | Freq: Once | INTRAVENOUS | Status: DC | PRN

## 2016-08-21 MED ORDER — IOPAMIDOL (ISOVUE-300) INJECTION 61%
INTRAVENOUS | Status: AC
  Administered 2016-08-21: 10:00:00
  Filled 2016-08-21: qty 100

## 2016-08-21 NOTE — Progress Notes (Signed)
1 Day Post-Op robotic Nissen Subjective: No complaints  Objective: Vital signs in last 24 hours: Temp:  [97.3 F (36.3 C)-98.2 F (36.8 C)] 97.9 F (36.6 C) (05/05 0433) Pulse Rate:  [64-93] 93 (05/05 0433) Resp:  [16-17] 16 (05/05 0433) BP: (136-174)/(62-84) 156/63 (05/05 0433) SpO2:  [90 %-100 %] 97 % (05/05 0433)   Intake/Output from previous day: 05/04 0701 - 05/05 0700 In: 3635 [I.V.:3635] Out: 1485 [Urine:1475; Blood:10] Intake/Output this shift: No intake/output data recorded.   General appearance: alert and cooperative GI: normal findings: soft, non-tender  Incision: no significant drainage  Lab Results:   Recent Labs  08/21/16 0514 08/21/16 0718  WBC 12.4* 13.7*  HGB 11.0* 11.8*  HCT 35.0* 36.7  PLT 316 337   BMET  Recent Labs  08/21/16 0718  NA 139  K 4.4  CL 107  CO2 24  GLUCOSE 165*  BUN 12  CREATININE 0.86  CALCIUM 8.8*   PT/INR No results for input(s): LABPROT, INR in the last 72 hours. ABG No results for input(s): PHART, HCO3 in the last 72 hours.  Invalid input(s): PCO2, PO2  MEDS, Scheduled . enoxaparin (LOVENOX) injection  40 mg Subcutaneous Q24H    Studies/Results: No results found.  Assessment: s/p Procedure(s): XI ROBOTIC HIATIAL HERNIA AND  NISSEN FUNDOPLICATION INSERTION OF MESH Patient Active Problem List   Diagnosis Date Noted  . S/P Nissen fundoplication (without gastrostomy tube) procedure 08/20/2016  . Hypokalemia 06/29/2016  . Abnormal TSH 06/26/2016  . Acute diastolic CHF (congestive heart failure) ([place])   . Acute gastric volvulus 06/23/2016  . Large hiatal hernia 06/23/2016  . Persistent atrial fibrillation ([place]) 11/30/2012  . Thoracic aorta atherosclerosis ([place]) 10/25/2012  . Essential hypertension, benign 09/29/2012  . TIA (transient ischemic attack) 08/26/2012  . H/O chronic cystitis 08/26/2012  . Hyperlipidemia with target LDL less than 130 06/29/2011  . Osteopenia 06/29/2011  . Allergic  rhinitis, mild 06/29/2011    Expected post op course  Plan: swallow eval today  If swallow eval ok, start clears and crushed PO meds   LOS: 1 day     .Rosario Adie, [facility], [place]   08/21/2016 8:57 AM

## 2016-08-22 MED ORDER — PRAVASTATIN SODIUM 20 MG PO TABS: 40.0000 mg | ORAL_TABLET | Freq: Every day | ORAL | Status: DC

## 2016-08-22 MED ORDER — METOPROLOL TARTRATE 25 MG PO TABS
25.0000 mg | ORAL_TABLET | Freq: Two times a day (BID) | ORAL | Status: DC
  Administered 2016-08-22: 25 mg via ORAL
  Filled 2016-08-22: qty 1

## 2016-08-22 MED ORDER — DILTIAZEM HCL ER COATED BEADS 180 MG PO CP24: 180.0000 mg | ORAL_CAPSULE | Freq: Every day | ORAL | Status: DC

## 2016-08-22 MED ORDER — APIXABAN 5 MG PO TABS: 5.0000 mg | ORAL_TABLET | Freq: Two times a day (BID) | ORAL | Status: DC

## 2016-08-22 MED ORDER — OXYCODONE HCL 5 MG/5ML PO SOLN: 5.0000 mg | ORAL | 0 refills | Status: DC | PRN

## 2016-08-22 MED ORDER — SODIUM CHLORIDE 0.9% FLUSH: 3.0000 mL | Freq: Two times a day (BID) | INTRAVENOUS | Status: DC

## 2016-08-22 MED ORDER — SODIUM CHLORIDE 0.9% FLUSH: 3.0000 mL | INTRAVENOUS | Status: DC | PRN

## 2016-08-22 MED ORDER — LOPERAMIDE HCL 1 MG/5ML PO LIQD
2.0000 mg | ORAL | Status: DC | PRN
  Administered 2016-08-22: 2 mg via ORAL
  Filled 2016-08-22: qty 10

## 2016-08-22 MED ORDER — FUROSEMIDE 40 MG PO TABS
40.0000 mg | ORAL_TABLET | Freq: Every day | ORAL | Status: DC
  Administered 2016-08-22: 40 mg via ORAL
  Filled 2016-08-22: qty 1

## 2016-08-22 NOTE — Discharge Summary (Signed)
Physician Discharge Summary  Patient ID: SHENELLE KLAS MRN: [id number] DOB/AGE: 81-Aug-1932 80 y.o.  Admit date: 08/20/2016 Discharge date: 08/22/2016  Admission Diagnoses: Hiatal hernia  Discharge Diagnoses:  Active Problems:   S/P Nissen fundoplication (without gastrostomy tube) procedure   Discharged Condition: good  Hospital Course: Pt admitted after surgery.  Swallow eval the next day showed no leak or obstruction.  Diet was advanced as tolerated.  She was discharged to home once tolerating a diet and PO narcotics.  She was urinating well and ambulating without difficulty.  Consults: None  Significant Diagnostic Studies: labs:   Treatments: IV hydration, analgesia: acetaminophen w/ codeine and surgery: robotic Nissen  Discharge Exam: Blood pressure 136/75, pulse 62, temperature 98.3 F (36.8 C), temperature source Oral, resp. rate 16, height 5\' 6"  (1.676 m), weight 70.3 kg (155 lb), SpO2 100 %. General appearance: alert and cooperative GI: normal findings: soft, non-tender Incision/Wound:  Clean, dry, intact  Disposition: 06-Home-Health Care Svc   Allergies as of 08/22/2016      Reactions   Lisinopril Other (See Comments)   DIZZY      Medication List    TAKE these medications   acetaminophen 500 MG tablet Commonly known as:  TYLENOL Take 500-1,000 mg by mouth every 6 (six) hours as needed (for headache/pain.).   CALCIUM CARBONATE-VIT D-MIN PO Take 2 tablets by mouth 3 (three) times a week.   COSAMIN DS PO Take 1 tablet by mouth 2 (two) times daily.   CRANBERRY PO Take 1 tablet by mouth daily.   diltiazem 180 MG 24 hr capsule Commonly known as:  CARDIZEM CD Take 1 capsule (180 mg total) by mouth daily. What changed:  when to take this   ELIQUIS 5 MG Tabs tablet Generic drug:  apixaban take 1 tablet by mouth twice a day   feeding supplement (ENSURE ENLIVE) Liqd Take 237 mLs by mouth 2 (two) times daily between meals.   furosemide 40 MG tablet Commonly  known as:  LASIX Take 1 tablet (40 mg total) by mouth daily.   LIPOFLAVONOID PO Take 1 tablet by mouth 2 (two) times daily as needed (for ringing in ears).   loratadine 10 MG tablet Commonly known as:  CLARITIN Take 10 mg by mouth daily as needed for allergies.   metoprolol tartrate 25 MG tablet Commonly known as:  LOPRESSOR Take 1 tablet (25 mg total) by mouth 2 (two) times daily.   ondansetron 4 MG tablet Commonly known as:  ZOFRAN Take 1 tablet (4 mg total) by mouth every 8 (eight) hours as needed for nausea or vomiting.   oxyCODONE 5 MG/5ML solution Commonly known as:  ROXICODONE Take 5-10 mLs (5-10 mg total) by mouth every 4 (four) hours as needed for moderate pain or severe pain.   pantoprazole 40 MG tablet Commonly known as:  PROTONIX Take 1 tablet (40 mg total) by mouth daily at 6 (six) AM.   pravastatin 40 MG tablet Commonly known as:  PRAVACHOL take 1 tablet by mouth once daily What changed:  See the new instructions.   RHINOCORT AQUA 32 MCG/ACT nasal spray Generic drug:  budesonide Place 1 spray into both nostrils daily.   sulfamethoxazole-trimethoprim 800-160 MG tablet Commonly known as:  BACTRIM DS,SEPTRA DS Take 1 tablet by mouth 2 (two) times daily.   SYSTANE OP Place 1 drop into both eyes 2 (two) times daily as needed (for dry eyes).      Follow-up Information    Ralene Ok, MD Follow up.  Specialty:  General Surgery Contact information: [address]   [phone]        Schedule an appointment as soon as possible for a visit in 2 week(s) to follow up.   Why:  For wound re-check         I have reviewed the [place] and the patient has no other narcotic Rx visible.  Signed: [place] Retana C. 05/28/6379, 77:11 AM

## 2016-08-22 NOTE — Progress Notes (Signed)
2 Days Post-Op robotic Nissen Subjective: Hasn't walked yet.  Foley still in.  Tolerating full liquid diet.  Pain controlled  Objective: Vital signs in last 24 hours: Temp:  [98.2 F (36.8 C)-98.4 F (36.9 C)] 98.3 F (36.8 C) (05/06 0441) Pulse Rate:  [57-69] 62 (05/06 0441) Resp:  [15-16] 16 (05/06 0441) BP: (121-136)/(51-75) 136/75 (05/06 0441) SpO2:  [93 %-100 %] 100 % (05/06 0441)   Intake/Output from previous day: 05/05 0701 - 05/06 0700 In: 4386.6 [P.O.:300; I.V.:4086.6] Out: 1505 [Urine:1505] Intake/Output this shift: No intake/output data recorded.   General appearance: alert and cooperative GI: normal findings: soft, non-tender  Incision: no significant drainage  Lab Results:   Recent Labs  08/21/16 0514 08/21/16 0718  WBC 12.4* 13.7*  HGB 11.0* 11.8*  HCT 35.0* 36.7  PLT 316 337   BMET  Recent Labs  08/21/16 0718  NA 139  K 4.4  CL 107  CO2 24  GLUCOSE 165*  BUN 12  CREATININE 0.86  CALCIUM 8.8*   PT/INR No results for input(s): LABPROT, INR in the last 72 hours. ABG No results for input(s): PHART, HCO3 in the last 72 hours.  Invalid input(s): PCO2, PO2  MEDS, Scheduled . enoxaparin (LOVENOX) injection  40 mg Subcutaneous Q24H  . sodium chloride flush  3 mL Intravenous Q12H    Studies/Results: Dg Esophagus W/water Sol Cm  Result Date: 08/21/2016 CLINICAL DATA:  Status post Nissen fundoplication EXAM: ESOPHOGRAM/BARIUM SWALLOW TECHNIQUE: Single contrast examination was performed using water-soluble contrast. FLUOROSCOPY TIME:  Fluoroscopy Time:  1 minutes 18 seconds Radiation Exposure Index (if provided by the fluoroscopic device): 21.6 mGy Number of Acquired Spot Images: Several spot fluoroscopic and sent 8 fluoroscopic images obtained COMPARISON:  06/22/2016 FINDINGS: Patient was administered for water-soluble contrast orally. Imaging performed of the esophagus and proximal stomach with the patient semi upright. Nissen fundoplication  changes noted. Nissen fundoplication appears patent without evidence of contrast extravasation or leakage. Esophagus is moderately distended with air and contrast with some delay in emptying through the Nissen fundoplication noted. IMPRESSION: Patent Nissen fundoplication.  See above comment. Electronically Signed   By: Jerilynn Mages.  Shick M.D.   On: 08/21/2016 10:59    Assessment: s/p Procedure(s): XI ROBOTIC HIATIAL HERNIA AND  NISSEN FUNDOPLICATION INSERTION OF MESH Patient Active Problem List   Diagnosis Date Noted  . S/P Nissen fundoplication (without gastrostomy tube) procedure 08/20/2016  . Hypokalemia 06/29/2016  . Abnormal TSH 06/26/2016  . Acute diastolic CHF (congestive heart failure) ([place])   . Acute gastric volvulus 06/23/2016  . Large hiatal hernia 06/23/2016  . Persistent atrial fibrillation ([place]) 11/30/2012  . Thoracic aorta atherosclerosis ([place]) 10/25/2012  . Essential hypertension, benign 09/29/2012  . TIA (transient ischemic attack) 08/26/2012  . H/O chronic cystitis 08/26/2012  . Hyperlipidemia with target LDL less than 130 06/29/2011  . Osteopenia 06/29/2011  . Allergic rhinitis, mild 06/29/2011    Expected post op course  Plan:   Cont FLD and crushed PO meds D/c foley Ambulate  Possible d/c this pm  Restart Elliquis in AM  LOS: 2 days     .Rosario Adie, [facility], [place]   08/22/2016 9:14 AM

## 2016-08-22 NOTE — Progress Notes (Signed)
Discharged from floor via w/c for transport home by car. Spouse & belongings with pt. No changes in assessment. Delsie Amador  

## 2016-08-22 NOTE — Discharge Instructions (Signed)
EATING AFTER YOUR ESOPHAGEAL SURGERY (Stomach Fundoplication, Hiatal Hernia repair, Achalasia surgery, etc)  ######################################################################  EAT Start with a pureed / full liquid diet (see below) Gradually transition to a high fiber diet with a fiber supplement over the next month after discharge.    WALK Walk an hour a day.  Control your pain to do that.    CONTROL PAIN Control pain so that you can walk, sleep, tolerate sneezing/coughing, go up/down stairs.  HAVE A BOWEL MOVEMENT DAILY Keep your bowels regular to avoid problems.  OK to try a laxative to override constipation.  OK to use an antidairrheal to slow down diarrhea.  Call if not better after 2 tries  CALL IF YOU HAVE PROBLEMS/CONCERNS Call if you are still struggling despite following these instructions. Call if you have concerns not answered by these instructions  ######################################################################   After your esophageal surgery, expect some sticking with swallowing over the next 1-2 months.    If food sticks when you eat, it is called "dysphagia".  This is due to swelling around your esophagus at the wrap & hiatal diaphragm repair.  It will gradually ease off over the next few months.  To help you through this temporary phase, we start you out on a pureed (blenderized) diet.  Your first meal in the hospital was thin liquids.  You should have been given a pureed diet by the time you left the hospital.  We ask patients to stay on a pureed diet for the first 2-3 weeks to avoid anything getting "stuck" near your recent surgery.  Don't be alarmed if your ability to swallow doesn't progress according to this plan.  Everyone is different and some diets can advance more or less quickly.     Some BASIC RULES to follow are:  Maintain an upright position whenever eating or drinking.  Take small bites - just a teaspoon size bite at a time.  Eat slowly.   It may also help to eat only one food at a time.  Consider nibbling through smaller, more frequent meals & avoid the urge to eat BIG meals  Do not push through feelings of fullness, nausea, or bloatedness  Do not mix solid foods and liquids in the same mouthful  Try not to "wash foods down" with large gulps of liquids.  Avoid carbonated (bubbly/fizzy) drinks.    Avoid foods that make you feel gassy or bloated.  Start with bland foods first.  Wait on trying greasy, fried, or spicy meals until you are tolerating more bland solids well.  Understand that it will be hard to burp and belch at first.  This gradually improves with time.  Expect to be more gassy/flatulent/bloated initially.  Walking will help your body manage it better.  Consider using medications for bloating that contain simethicone such as  Maalox or Gas-X   Eat in a relaxed atmosphere & minimize distractions.  Avoid talking while eating.    Do not use straws.  Following each meal, sit in an upright position (90 degree angle) for 60 to 90 minutes.  Going for a short walk can help as well  If food does stick, don't panic.  Try to relax and let the food pass on its own.  Sipping WARM LIQUID such as strong hot black tea can also help slide it down.   Be gradual in changes & use common sense:  -If you easily tolerating a certain "level" of foods, advance to the next level gradually -If you are  having trouble swallowing a particular food, then avoid it.   -If food is sticking when you advance your diet, go back to thinner previous diet (the lower LEVEL) for 1-2 days.  LEVEL 1 = PUREED DIET  Do for the first 2 WEEKS AFTER SURGERY  -Foods in this group are pureed or blenderized to a smooth, mashed potato-like consistency.  -If necessary, the pureed foods can keep their shape with the addition of a thickening agent.   -Meat should be pureed to a smooth, pasty consistency.  Hot broth or gravy may be added to the pureed  meat, approximately 1 oz. of liquid per 3 oz. serving of meat. -CAUTION:  If any foods do not puree into a smooth consistency, swallowing will be more difficult.  (For example, nuts or seeds sometimes do not blend well.)  Hot Foods Cold Foods  Pureed scrambled eggs and cheese Pureed cottage cheese  Baby cereals Thickened juices and nectars  Thinned cooked cereals (no lumps) Thickened milk or eggnog  Pureed [place] toast or pancakes Ensure  Mashed potatoes Ice cream  Pureed parsley, au gratin, scalloped potatoes, candied sweet potatoes Fruit or [place] ice, sherbet  Pureed buttered or alfredo noodles Plain yogurt  Pureed vegetables (no corn or peas) Instant breakfast  Pureed soups and creamed soups Smooth pudding, mousse, custard  Pureed scalloped apples Whipped gelatin  Gravies Sugar, syrup, honey, jelly  Sauces, cheese, tomato, barbecue, white, creamed Cream  Any baby food Creamer  Alcohol in moderation (not beer or champagne) Margarine  Coffee or tea Mayonnaise   Ketchup, mustard   Apple sauce   SAMPLE MENU:  PUREED DIET Breakfast Lunch Dinner   Orange juice, 1/2 cup  Cream of wheat, 1/2 cup  Pineapple juice, 1/2 cup  Pureed [place], barley soup, 3/4 cup  Pureed Hawaiian chicken, 3 oz   Scrambled eggs, mashed or blended with cheese, 1/2 cup  Tea or coffee, 1 cup   Whole milk, 1 cup   Non-dairy creamer, 2 Tbsp.  Mashed potatoes, 1/2 cup  Pureed cooled broccoli, 1/2 cup  Apple sauce, 1/2 cup  Coffee or tea  Mashed potatoes, 1/2 cup  Pureed spinach, 1/2 cup  Frozen yogurt, 1/2 cup  Tea or coffee      LEVEL 2 = SOFT DIET  After your first 2 weeks, you can advance to a soft diet.   Keep on this diet until everything goes down easily.  Hot Foods Cold Foods  White fish Cottage cheese  Stuffed fish Junior baby fruit  Baby food meals Semi thickened juices  Minced soft cooked, scrambled, poached eggs nectars  Souffle & omelets Ripe mashed bananas  Cooked  cereals Canned fruit, pineapple sauce, milk  potatoes Milkshake  Buttered or Alfredo noodles Custard  Cooked cooled vegetable Puddings, including tapioca  Sherbet Yogurt  Vegetable soup or alphabet soup Fruit ice, [place] ice  Gravies Whipped gelatin  Sugar, syrup, honey, jelly Junior baby desserts  Sauces:  Cheese, creamed, barbecue, tomato, white Cream  Coffee or tea Margarine   SAMPLE MENU:  LEVEL 2 Breakfast Lunch Dinner   Orange juice, 1/2 cup  Oatmeal, 1/2 cup  Scrambled eggs with cheese, 1/2 cup  Decaffeinated tea, 1 cup  Whole milk, 1 cup  Non-dairy creamer, 2 Tbsp  Pineapple juice, 1/2 cup  Minced beef, 3 oz  Gravy, 2 Tbsp  Mashed potatoes, 1/2 cup  Minced fresh broccoli, 1/2 cup  Applesauce, 1/2 cup  Coffee, 1 cup  [place], barley soup, 3/4 cup  Minced Hawaiian chicken, 3 oz  Mashed potatoes, 1/2 cup  Cooked spinach, 1/2 cup  Frozen yogurt, 1/2 cup  Non-dairy creamer, 2 Tbsp      LEVEL 3 = CHOPPED DIET  -After all the foods in level 2 (soft diet) are passing through well you should advance up to more chopped foods.  -It is still important to cut these foods into small pieces and eat slowly.  Hot Foods Cold Foods  Poultry Cottage cheese  Chopped Swedish meatballs Yogurt  Meat salads (ground or flaked meat) Milk  Flaked fish (tuna) Milkshakes  Poached or scrambled eggs Soft, cold, dry cereal  Souffles and omelets Fruit juices or nectars  Cooked cereals Chopped canned fruit  Chopped [place] toast or pancakes Canned fruit cocktail  Noodles or pasta (no rice) Pudding, mousse, custard  Cooked vegetables (no frozen peas, corn, or mixed vegetables) Green salad  Canned small sweet peas Ice cream  Creamed soup or vegetable soup Fruit ice, [place] ice  Pureed vegetable soup or alphabet soup Non-dairy creamer  Ground scalloped apples Margarine  Gravies Mayonnaise  Sauces:  Cheese, creamed, barbecue, tomato, white Ketchup  Coffee or tea Mustard    SAMPLE MENU:  LEVEL 3 Breakfast Lunch Dinner   Orange juice, 1/2 cup  Oatmeal, 1/2 cup  Scrambled eggs with cheese, 1/2 cup  Decaffeinated tea, 1 cup  Whole milk, 1 cup  Non-dairy creamer, 2 Tbsp  Ketchup, 1 Tbsp  Margarine, 1 tsp  Salt, 1/4 tsp  Sugar, 2 tsp  Pineapple juice, 1/2 cup  Ground beef, 3 oz  Gravy, 2 Tbsp  Mashed potatoes, 1/2 cup  Cooked spinach, 1/2 cup  Applesauce, 1/2 cup  Decaffeinated coffee  Whole milk  Non-dairy creamer, 2 Tbsp  Margarine, 1 tsp  Salt, 1/4 tsp  Pureed [place], barley soup, 3/4 cup  Barbecue chicken, 3 oz  Mashed potatoes, 1/2 cup  Ground fresh broccoli, 1/2 cup  Frozen yogurt, 1/2 cup  Decaffeinated tea, 1 cup  Non-dairy creamer, 2 Tbsp  Margarine, 1 tsp  Salt, 1/4 tsp  Sugar, 1 tsp    LEVEL 4:  REGULAR FOODS  -Foods in this group are soft, moist, regularly textured foods.   -This level includes meat and breads, which tend to be the hardest things to swallow.   -Eat very slowly, chew well and continue to avoid carbonated drinks. -most people are at this level in 4-6 weeks  Hot Foods Cold Foods  Baked fish or skinned Soft cheeses - cottage cheese  Souffles and omelets Cream cheese  Eggs Yogurt  Stuffed shells Milk  Spaghetti with meat sauce Milkshakes  Cooked cereal Cold dry cereals (no nuts, dried fruit, coconut)  [place] toast or pancakes Crackers  Buttered toast Fruit juices or nectars  Noodles or pasta (no rice) Canned fruit  Potatoes (all types) Ripe bananas  Soft, cooked vegetables (no corn, lima, or baked beans) Peeled, ripe, fresh fruit  Creamed soups or vegetable soup Cakes (no nuts, dried fruit, coconut)  Canned chicken noodle soup Plain doughnuts  Gravies Ice cream  Bacon dressing Pudding, mousse, custard  Sauces:  Cheese, creamed, barbecue, tomato, white Fruit ice, [place] ice, sherbet  Decaffeinated tea or coffee Whipped gelatin  Pork chops Regular gelatin   Canned fruited  gelatin molds   Sugar, syrup, honey, jam, jelly   Cream   Non-dairy   Margarine   Oil   Mayonnaise   Ketchup   Mustard   TROUBLESHOOTING IRREGULAR BOWELS  1) Avoid extremes of bowel  movements (no bad constipation/diarrhea)  °2) Miralax 17gm mixed in 8oz. water or juice-daily. May use BID as needed.  °3) Gas-x,Phazyme, etc. as needed for gas & bloating.  °4) Soft,bland diet. No spicy,greasy,fried foods.  °5) Prilosec over-the-counter as needed  °6) May hold gluten/wheat products from diet to see if symptoms improve.  °7) May try probiotics (Align, Activa, etc) to help calm the bowels down  °7) If symptoms become worse call back immediately. ° ° ° °If you have any questions please call our office at [facility]: [phone]. ° °

## 2016-09-02 DIAGNOSIS — R197 Diarrhea, unspecified: Secondary | ICD-10-CM | POA: Diagnosis not present

## 2016-09-08 DIAGNOSIS — R350 Frequency of micturition: Secondary | ICD-10-CM | POA: Diagnosis not present

## 2016-09-08 DIAGNOSIS — N3020 Other chronic cystitis without hematuria: Secondary | ICD-10-CM | POA: Diagnosis not present

## 2016-09-28 DIAGNOSIS — R310 Gross hematuria: Secondary | ICD-10-CM | POA: Diagnosis not present

## 2016-10-08 ENCOUNTER — Encounter: Payer: Self-pay | Admitting: *Deleted

## 2016-10-17 ENCOUNTER — Other Ambulatory Visit: Payer: Self-pay | Admitting: Cardiology

## 2016-10-26 NOTE — Progress Notes (Signed)
[name] Date of Birth: 1930/06/22 Medical Record #[id number]  History of Present Illness: [name] is seen for followup for history of TIA and paroxysmal atrial fibrillation.  She is on anticoagulation with Eliquis.  She is on metoprolol for rate control and prior event monitor showed good control when she is in Afib.  She  was admitted in March 2018 with nausea vomiting and the chest Spencer. Initial EKG showed normal sinus rhythm, CT of abdomen and pelvis showed large intrathoracic gastric hernia, no wall thickening all pneumatosis. Finding most likely representing spontaneous interval reduction of gastric volvulus. KUB was unremarkable. Ultrasound of abdomen does not show any gallstones. During the hospitalization, she developed worsening shortness of breath after aggressive IV hydration. She was treated with IV Lasix afterward with good urinary output. She was also found to be hypothyroid and received thyroid replacement. Her hospital stay was complicated by atrial fibrillation with RVR. She was  on eliquis. Troponin was elevated and a BNP was 836. Cardiology was consulted for atrial fibrillation and elevated troponin. Elevated troponin was felt to be related to demand ischemia with heart failure and the pneumonia. Echocardiogram obtained on 06/28/2016 showed EF 54-65%, grade 1 diastolic dysfunction. She required cardioversion on 06/29/2016 but was unable to maintain NSR. When seen in April she was back in NSR.   On 08/21/16 she was readmitted and underwent Nissen fundoplication without complication. She later did have a UTI.  She's had no  TIA or CVA symptoms. She denies any recurrent Afib symptoms since medications resumed post op. Appetite improving. Still notes some intermittent diarrhea. With all these illnesses she has lost 9 lbs since January.   Current Outpatient Prescriptions on File Prior to Visit  Medication Sig Dispense Refill  . acetaminophen (TYLENOL) 500 MG tablet Take 500-1,000 mg  by mouth every 6 (six) hours as needed (for headache/Spencer.).    Marland Kitchen budesonide (RHINOCORT AQUA) 32 MCG/ACT nasal spray Place 1 spray into both nostrils daily.    Marland Kitchen CALCIUM CARBONATE-VIT D-MIN PO Take 2 tablets by mouth 3 (three) times a week.     Marland Kitchen CRANBERRY PO Take 1 tablet by mouth daily.    Marland Kitchen diltiazem (CARDIZEM CD) 180 MG 24 hr capsule Take 1 capsule (180 mg total) by mouth daily. (Patient taking differently: Take 180 mg by mouth at bedtime. ) 90 capsule 3  . ELIQUIS 5 MG TABS tablet take 1 tablet by mouth twice a day 180 tablet 1  . furosemide (LASIX) 40 MG tablet Take 1 tablet (40 mg total) by mouth daily. 90 tablet 3  . Glucosamine-Chondroitin (COSAMIN DS PO) Take 1 tablet by mouth 2 (two) times daily.    Marland Kitchen loratadine (CLARITIN) 10 MG tablet Take 10 mg by mouth daily as needed for allergies.     . metoprolol tartrate (LOPRESSOR) 25 MG tablet Take 1 tablet (25 mg total) by mouth 2 (two) times daily. 180 tablet 3  . ondansetron (ZOFRAN) 4 MG tablet Take 1 tablet (4 mg total) by mouth every 8 (eight) hours as needed for nausea or vomiting. 20 tablet 0  . pantoprazole (PROTONIX) 40 MG tablet Take 1 tablet (40 mg total) by mouth daily at 6 (six) AM. 90 tablet 3  . Polyethyl Glycol-Propyl Glycol (SYSTANE OP) Place 1 drop into both eyes 2 (two) times daily as needed (for dry eyes).     . pravastatin (PRAVACHOL) 40 MG tablet take 1 tablet by mouth once daily (Patient taking differently: TAKE 1 TABLET (40 MG)  BY MOUTH ONCE DAILY IN THE EVENING) 30 tablet 11  . Vitamins-Lipotropics (LIPOFLAVONOID PO) Take 1 tablet by mouth 2 (two) times daily as needed (for ringing in ears).     No current facility-administered medications on file prior to visit.     Allergies  Allergen Reactions  . Lisinopril Other (See Comments)    DIZZY    Past Medical History:  Diagnosis Date  . Arthritis    "hands, little in my feet" (06/22/2016)  . Candidiasis of vulva and vagina   . Cystocele, midline   . Disorder of  bone and cartilage, unspecified   . Fibroids   . Hypertension   . Large hiatal hernia 06/23/2016  . Migraine    "none in the last few years" (06/22/2016)  . Other chronic cystitis   . Other sign and symptom in breast   . Paroxysmal atrial fibrillation ([place]) 11/30/2012  . Pneumonia    "I've had walking pneumonia a couple times"  (06/22/2016)  . Pneumonia dx'd 06/15/2016  . Rectal incontinence   . Stroke [facility]) 2014   hx of mini stroke   . TIA (transient ischemic attack) 2015    Past Surgical History:  Procedure Laterality Date  . CARDIOVERSION N/A 06/29/2016   Procedure: CARDIOVERSION;  Surgeon: Katrina Pain, MD;  Location: [facility] OR;  Service: Cardiovascular;  Laterality: N/A;  . CATARACT EXTRACTION W/ INTRAOCULAR LENS IMPLANT Left   . DILATION AND CURETTAGE OF UTERUS    . HYSTEROSCOPY W/D&C  03/16/1999  . INSERTION OF MESH N/A 08/20/2016   Procedure: INSERTION OF MESH;  Surgeon: Katrina Ok, MD;  Location: [facility] ORS;  Service: General;  Laterality: N/A;  . TEE WITHOUT CARDIOVERSION N/A 11/06/2012   Procedure: TRANSESOPHAGEAL ECHOCARDIOGRAM (TEE);  Surgeon: Katrina Perla, MD;  Location: [facility] ENDOSCOPY;  Service: Cardiovascular;  Laterality: N/A;  . TONSILLECTOMY  1937  . VAGINAL HYSTERECTOMY  09/2005   Anterior repair; Removal of urethral caruncle./notes 09/02/2015    History  Smoking Status  . Never Smoker  Smokeless Tobacco  . Never Used    History  Alcohol Use  . 1.2 oz/week  . 2 Glasses of wine per week    Comment: one glass of wine 1-2 days per week     Family History  Problem Relation Age of Onset  . Hypertension Mother   . Heart disease Father     Review of Systems: As noted in history of present illness. All other systems were reviewed and are negative.  Physical Exam: BP (!) 146/77   Pulse 69   Ht 5\' 6"  (1.676 m)   Wt 151 lb 9.6 oz (68.8 kg)   SpO2 95%   BMI 24.47 kg/m  She is a pleasant white female in no acute distress. HEENT: Normal, neck is without JVD  or bruits. Lungs: Clear Cardiovascular: Regular rate and rhythm, normal S1 and S2, no gallop or murmur. Abdomen: Soft and nontender. No masses or hepatosplenomegaly. No bruits. Extremities: No cyanosis or edema. Pulses are 2+ and symmetric. No phlebitis. Skin: Warm and dry Neuro: Alert and oriented x3. Cranial nerves II through XII are intact. No focal motor or sensory deficits.  LABORATORY DATA: Lab Results  Component Value Date   WBC 13.7 (H) 08/21/2016   HGB 11.8 (L) 08/21/2016   HCT 36.7 08/21/2016   PLT 337 08/21/2016   GLUCOSE 165 (H) 08/21/2016   CHOL 89 06/27/2016   TRIG 55 06/27/2016   HDL 41 06/27/2016   LDLCALC 37 06/27/2016  ALT 12 07/07/2016   AST 17 07/07/2016   NA 139 08/21/2016   K 4.4 08/21/2016   CL 107 08/21/2016   CREATININE 0.86 08/21/2016   BUN 12 08/21/2016   CO2 24 08/21/2016   TSH 9.566 (H) 06/26/2016   INR 1.31 06/22/2016   HGBA1C 5.8 (H) 08/26/2012    Echo 07/04/16: Study Conclusions  - Left ventricle: The cavity size was normal. Wall thickness was   increased in a pattern of mild LVH. Systolic function was normal.   The estimated ejection fraction was in the range of 50% to 55%.   Wall motion was normal; there were no regional wall motion   abnormalities. Doppler parameters are consistent with abnormal   left ventricular relaxation (grade 1 diastolic dysfunction). - Mitral valve: Moderately calcified annulus. There was moderate   regurgitation. - Left atrium: The atrium was mildly dilated. - Right atrium: The atrium was mildly dilated.  Assessment / Plan: 1. TIA. Now long-term anticoagulation with Eliquis. She is tolerating this well.  2. Paroxysmal atrial fibrillation. Exacerbated during hospitalization in March due to acute medical conditions. Continue rate control with metoprolol and cardizem. If she has more sustained or frequent Afib would need to consider anti-arrhythmic drug therapy.  3. Hypertension- BP well controlled.  4.  Hyperlipidemia-on pravastatin with good results.  5. S/p Nissen fundoplication.  I will follow up in 6 months.

## 2016-10-28 ENCOUNTER — Ambulatory Visit (INDEPENDENT_AMBULATORY_CARE_PROVIDER_SITE_OTHER): Payer: Medicare Other | Admitting: Cardiology

## 2016-10-28 ENCOUNTER — Encounter: Payer: Self-pay | Admitting: Cardiology

## 2016-10-28 VITALS — BP 146/77 | HR 69 | Ht 66.0 in | Wt 151.6 lb

## 2016-10-28 DIAGNOSIS — I480 Paroxysmal atrial fibrillation: Secondary | ICD-10-CM | POA: Diagnosis not present

## 2016-10-28 DIAGNOSIS — I10 Essential (primary) hypertension: Secondary | ICD-10-CM

## 2016-10-28 DIAGNOSIS — E785 Hyperlipidemia, unspecified: Secondary | ICD-10-CM

## 2016-10-28 NOTE — Patient Instructions (Signed)
Continue your current therapy  I will see you in about 6 months   

## 2016-10-29 DIAGNOSIS — Z23 Encounter for immunization: Secondary | ICD-10-CM | POA: Diagnosis not present

## 2016-10-29 DIAGNOSIS — N3020 Other chronic cystitis without hematuria: Secondary | ICD-10-CM | POA: Diagnosis not present

## 2016-10-29 DIAGNOSIS — R350 Frequency of micturition: Secondary | ICD-10-CM | POA: Diagnosis not present

## 2016-10-29 NOTE — Anesthesia Postprocedure Evaluation (Signed)
Anesthesia Post Note  Patient: [name]  Procedure(s) Performed: Procedure(s) (LRB): XI ROBOTIC HIATIAL HERNIA AND  NISSEN FUNDOPLICATION (N/A) INSERTION OF MESH (N/A)     Anesthesia Post Evaluation  Last Vitals:  Vitals:   08/21/16 2137 08/22/16 0441  BP: (!) 121/51 136/75  Pulse: (!) 57 62  Resp: 15 16  Temp: 36.8 C 36.8 C    Last Pain:  Vitals:   08/22/16 0730  TempSrc:   PainSc: 2                  Rheba Diamond EDWARD

## 2016-10-29 NOTE — Addendum Note (Signed)
Addendum  created 10/29/16 1214 by Lyndle Herrlich, MD   Sign clinical note

## 2017-02-03 DIAGNOSIS — R350 Frequency of micturition: Secondary | ICD-10-CM | POA: Diagnosis not present

## 2017-02-03 DIAGNOSIS — N3020 Other chronic cystitis without hematuria: Secondary | ICD-10-CM | POA: Diagnosis not present

## 2017-02-22 DIAGNOSIS — H52203 Unspecified astigmatism, bilateral: Secondary | ICD-10-CM | POA: Diagnosis not present

## 2017-02-22 DIAGNOSIS — H2511 Age-related nuclear cataract, right eye: Secondary | ICD-10-CM | POA: Diagnosis not present

## 2017-02-23 ENCOUNTER — Other Ambulatory Visit: Payer: Self-pay | Admitting: Family Medicine

## 2017-02-23 ENCOUNTER — Telehealth: Payer: Self-pay | Admitting: Family Medicine

## 2017-02-23 DIAGNOSIS — E785 Hyperlipidemia, unspecified: Secondary | ICD-10-CM

## 2017-02-23 MED ORDER — PRAVASTATIN SODIUM 40 MG PO TABS: 40.0000 mg | ORAL_TABLET | Freq: Every day | ORAL | 2 refills | Status: DC

## 2017-02-23 NOTE — Telephone Encounter (Signed)
Pt called for refill.  Pt needs cpe.  Scheduled for January 2019.  Gave her refill to cover until appt.

## 2017-04-13 ENCOUNTER — Other Ambulatory Visit: Payer: Self-pay | Admitting: Cardiology

## 2017-04-26 ENCOUNTER — Encounter: Payer: Self-pay | Admitting: Family Medicine

## 2017-04-26 ENCOUNTER — Ambulatory Visit: Payer: Medicare Other | Admitting: Family Medicine

## 2017-04-26 VITALS — BP 110/60 | HR 64 | Temp 98.1°F | Resp 16 | Ht 65.5 in | Wt 153.6 lb

## 2017-04-26 DIAGNOSIS — Z87448 Personal history of other diseases of urinary system: Secondary | ICD-10-CM

## 2017-04-26 DIAGNOSIS — I700 Atherosclerosis of aorta: Secondary | ICD-10-CM

## 2017-04-26 DIAGNOSIS — I480 Paroxysmal atrial fibrillation: Secondary | ICD-10-CM | POA: Diagnosis not present

## 2017-04-26 DIAGNOSIS — J309 Allergic rhinitis, unspecified: Secondary | ICD-10-CM | POA: Diagnosis not present

## 2017-04-26 DIAGNOSIS — R7301 Impaired fasting glucose: Secondary | ICD-10-CM | POA: Diagnosis not present

## 2017-04-26 DIAGNOSIS — Z9889 Other specified postprocedural states: Secondary | ICD-10-CM

## 2017-04-26 DIAGNOSIS — R197 Diarrhea, unspecified: Secondary | ICD-10-CM | POA: Diagnosis not present

## 2017-04-26 DIAGNOSIS — E785 Hyperlipidemia, unspecified: Secondary | ICD-10-CM

## 2017-04-26 DIAGNOSIS — I10 Essential (primary) hypertension: Secondary | ICD-10-CM | POA: Diagnosis not present

## 2017-04-26 MED ORDER — PRAVASTATIN SODIUM 40 MG PO TABS
40.0000 mg | ORAL_TABLET | Freq: Every day | ORAL | 3 refills | Status: DC
Start: 2017-04-26 — End: 2018-04-24

## 2017-04-26 NOTE — Progress Notes (Signed)
[name] is a 84 y.o. female who presents for annual wellness visit and follow-up on chronic medical conditions.  She has the following concerns: Her allergies are under good control.  She continues on OTC meds for that.  She takes Eliquis and is also taking Cardizem.  Review of the record indicates aortic atherosclerosis.  While in the hospital she had another episode of atrial fib but presently is doing fine.  Continues on her statin without difficulty.  She does see her urologist for chronic cystitis.  Usually the cystitis is in association with diarrhea.  She states that she has had difficulty with diarrhea intermittently since the fundoplication.  She has been on multiple antibiotics over the last several years.  She has no associated nausea, vomiting associated with the diarrhea.  She does not see any bloody stools.  She is taking Metamucil for this.  Immunizations and Health Maintenance Immunization History  Administered Date(s) Administered  . Influenza Split 12/18/2013  . Influenza-Unspecified 01/12/2015, 01/13/2016  . Pneumococcal Conjugate-13 10/02/2013  . Pneumococcal Polysaccharide-23 12/09/2014  . Tdap 04/19/2004  . Zoster 04/19/2004  . Zoster Recombinat (Shingrix) 03/21/2017    Last Pap smear:N/A Last mammogram:N/A Last colonoscopy: Last DEXA:N/A Dentist:Guill Ophtho: [place] Exercise: walking Other doctors caring for patient include: Jordan,Ramirez,Borden  Advanced directives:yescopy asked for    Depression screen:  See questionnaire below.  Depression screen [facility] 2/9 04/26/2017 01/15/2016 12/09/2014 10/02/2013  Decreased Interest 0 0 0 0  Down, Depressed, Hopeless 0 0 0 0  PHQ - 2 Score 0 0 0 0    Fall Risk Screen: see questionnaire below. Fall Risk  04/26/2017 01/15/2016 12/09/2014 10/02/2013  Falls in the past year? No No Yes No  Number falls in past yr: - - 1 -  Injury with Fall? - - Yes -  Follow up - - Falls evaluation completed;Education provided -    ADL  screen:  See questionnaire below Functional Status Survey:  neg   Review of Systems Constitutional: -, -unexpected weight change, -anorexia, -fatigue Dermatology: denies changing moles, rash, lumps ENT: -runny nose, -ear pain, -sore throat,  Cardiology:  -chest pain, -palpitations, -orthopnea, Respiratory: -cough, -shortness of breath, -dyspnea on exertion, -wheezing,  Hematology: -bleeding or bruising problems Musculoskeletal: -arthralgias, -myalgias, -joint swelling, -back pain, - Ophthalmology: -vision changes,   Neurology: -, -numbness, , -memory loss, -falls, -dizziness    PHYSICAL EXAM:  BP 110/60   Pulse 64   Resp 16   Ht 5' 5.5" (1.664 m)   Wt 153 lb 9.6 oz (69.7 kg)   SpO2 94%   BMI 25.17 kg/m   General Appearance: Alert, cooperative, no distress, appears stated age Head: Normocephalic, without obvious abnormality, atraumatic Eyes: PERRL, conjunctiva/corneas clear, EOM's intact, fundi benign Ears: Normal TM's and external ear canals Nose: Nares normal, mucosa normal, no drainage or sinus tenderness Throat: Lips, mucosa, and tongue normal; teeth and gums normal Neck: Supple, no lymphadenopathy;  thyroid:  no enlargement/tenderness/nodules; no carotid bruit or JVD Lungs: Clear to auscultation bilaterally without wheezes, rales or ronchi; respirations unlabored Heart: Regular rate and rhythm, S1 and S2 normal, no murmur, rubor gallop Abdomen: Soft, non-tender, nondistended, normoactive bowel sounds,  no masses, no hepatosplenomegaly Extremities: No clubbing, cyanosis or edema Pulses: 2+ and symmetric all extremities Skin:  Skin color, texture, turgor normal, no rashes or lesions Lymph nodes: Cervical, supraclavicular, and axillary nodes normal Neurologic:  CNII-XII intact, normal strength, sensation and gait; reflexes 2+ and symmetric throughout Psych: Normal mood, affect, hygiene and grooming.  ASSESSMENT/PLAN: Allergic rhinitis, mild  Essential hypertension,  benign - Plan: CBC with Differential/Platelet, Comprehensive metabolic panel  Thoracic aorta atherosclerosis (HCC) - Plan: CBC with Differential/Platelet, Comprehensive metabolic panel, Lipid panel  Intermittent atrial fibrillation (HCC) - Plan: CBC with Differential/Platelet, Comprehensive metabolic panel, Lipid panel  Hyperlipidemia with target LDL less than 130 - Plan: Lipid panel  H/O chronic cystitis  S/P Nissen fundoplication (without gastrostomy tube) procedure  Diarrhea, unspecified type  She will continue on the medications listed in the chart.  I will follow-up on the diarrhea especially concerning the possibility of C. difficile.  Discussed the fact that if the testing is negative, we can have her see gastroenterology.  She will consider that.  She will follow-up with her previous specialists.  She is also to check with the drugstore concerning her tetanus.    Medicare Attestation I have personally reviewed: The patient's medical and social history Their use of alcohol, tobacco or illicit drugs Their current medications and supplements The patient's functional ability including ADLs,fall risks, home safety risks, cognitive, and hearing and visual impairment Diet and physical activities Evidence for depression or mood disorders  The patient's weight, height, and BMI have been recorded in the chart.  I have made referrals, counseling, and provided education to the patient based on review of the above and I have provided the patient with a written personalized care plan for preventive services.     Jill Alexanders, MD   04/26/2017

## 2017-04-28 ENCOUNTER — Other Ambulatory Visit: Payer: Self-pay

## 2017-04-28 DIAGNOSIS — R197 Diarrhea, unspecified: Secondary | ICD-10-CM | POA: Diagnosis not present

## 2017-04-28 LAB — COMPREHENSIVE METABOLIC PANEL
AG RATIO: 1.5 (calc) (ref 1.0–2.5)
ALBUMIN MSPROF: 4.5 g/dL (ref 3.6–5.1)
ALT: 15 U/L (ref 6–29)
AST: 20 U/L (ref 10–35)
Alkaline phosphatase (APISO): 81 U/L (ref 33–130)
BUN / CREAT RATIO: 26 (calc) — AB (ref 6–22)
BUN: 24 mg/dL (ref 7–25)
CHLORIDE: 104 mmol/L (ref 98–110)
CO2: 28 mmol/L (ref 20–32)
CREATININE: 0.94 mg/dL — AB (ref 0.60–0.88)
Calcium: 9.9 mg/dL (ref 8.6–10.4)
GLOBULIN: 3 g/dL (ref 1.9–3.7)
GLUCOSE: 111 mg/dL — AB (ref 65–99)
POTASSIUM: 4.4 mmol/L (ref 3.5–5.3)
SODIUM: 142 mmol/L (ref 135–146)
Total Bilirubin: 0.4 mg/dL (ref 0.2–1.2)
Total Protein: 7.5 g/dL (ref 6.1–8.1)

## 2017-04-28 LAB — TEST AUTHORIZATION

## 2017-04-28 LAB — CBC WITH DIFFERENTIAL/PLATELET
BASOS ABS: 47 {cells}/uL (ref 0–200)
Basophils Relative: 0.5 %
EOS PCT: 1.7 %
Eosinophils Absolute: 160 cells/uL (ref 15–500)
HCT: 44 % (ref 35.0–45.0)
Hemoglobin: 14.2 g/dL (ref 11.7–15.5)
LYMPHS ABS: 3628 {cells}/uL (ref 850–3900)
MCH: 27.5 pg (ref 27.0–33.0)
MCHC: 32.3 g/dL (ref 32.0–36.0)
MCV: 85.1 fL (ref 80.0–100.0)
MPV: 10.1 fL (ref 7.5–12.5)
Monocytes Relative: 7.6 %
Neutro Abs: 4850 cells/uL (ref 1500–7800)
Neutrophils Relative %: 51.6 %
Platelets: 379 10*3/uL (ref 140–400)
RBC: 5.17 10*6/uL — ABNORMAL HIGH (ref 3.80–5.10)
RDW: 14.9 % (ref 11.0–15.0)
TOTAL LYMPHOCYTE: 38.6 %
WBC mixed population: 714 cells/uL (ref 200–950)
WBC: 9.4 10*3/uL (ref 3.8–10.8)

## 2017-04-28 LAB — LIPID PANEL
CHOL/HDL RATIO: 2.6 (calc) (ref <5.0)
Cholesterol: 161 mg/dL (ref <200)
HDL: 61 mg/dL (ref >50)
LDL Cholesterol (Calc): 77 mg/dL (calc)
NON-HDL CHOLESTEROL (CALC): 100 mg/dL (ref <130)
Triglycerides: 129 mg/dL (ref <150)

## 2017-04-28 LAB — HEMOGLOBIN A1C W/OUT EAG: Hgb A1c MFr Bld: 6.1 % of total Hgb — ABNORMAL HIGH (ref <5.7)

## 2017-04-29 LAB — OVA AND PARASITE EXAMINATION
CONCENTRATE RESULT: NONE SEEN
MICRO NUMBER:: 90041669
SPECIMEN QUALITY:: ADEQUATE
TRICHROME RESULT: NONE SEEN

## 2017-05-02 LAB — STOOL CULTURE
MICRO NUMBER: 90042138
MICRO NUMBER: 90042168
MICRO NUMBER:: 90042139
MICRO NUMBER:: 90042140
MICRO NUMBER:: 90042169
MICRO NUMBER:: 90042170
SHIGA RESULT: NOT DETECTED
SHIGA RESULT:: NOT DETECTED
SPECIMEN QUALITY: ADEQUATE
SPECIMEN QUALITY:: ADEQUATE
SPECIMEN QUALITY:: ADEQUATE
SPECIMEN QUALITY:: ADEQUATE
SPECIMEN QUALITY:: ADEQUATE
SPECIMEN QUALITY:: ADEQUATE

## 2017-05-02 LAB — OVA AND PARASITE EXAMINATION
CONCENTRATE RESULT:: NONE SEEN
SPECIMEN QUALITY: ADEQUATE
TRICHROME RESULT: NONE SEEN
VKL: 90041658

## 2017-05-09 NOTE — Progress Notes (Signed)
[name] Date of Birth: [birth year] Medical Record #[id number]  History of Present Illness: [name] is seen for followup for history of TIA and paroxysmal atrial fibrillation.  She is on anticoagulation with Eliquis.  She is on metoprolol and cardizem for rate control and prior event monitor showed good control when she is in Afib.  She  was admitted in March 2018 with nausea vomiting and the chest pain. Initial EKG showed normal sinus rhythm, CT of abdomen and pelvis showed large intrathoracic gastric hernia, no wall thickening all pneumatosis. Finding most likely representing spontaneous interval reduction of gastric volvulus. KUB was unremarkable. Ultrasound of abdomen does not show any gallstones. During the hospitalization, she developed worsening shortness of breath after aggressive IV hydration. She was treated with IV Lasix afterward with good urinary output. She was also found to be hypothyroid and received thyroid replacement. Her hospital stay was complicated by atrial fibrillation with RVR. She was  on eliquis. Troponin was elevated and a BNP was 836. Cardiology was consulted for atrial fibrillation and elevated troponin. Elevated troponin was felt to be related to demand ischemia with heart failure and the pneumonia. Echocardiogram obtained on 06/28/2016 showed EF 40-98%, grade 1 diastolic dysfunction. She required cardioversion on 06/29/2016 but was unable to maintain NSR. When seen in April she was back in NSR.   On 08/21/16 she was readmitted and underwent Nissen fundoplication without complication. This was uncomplicated.  On follow up today she is doing very well. Notes very rare palpitations. She denies any recurrent Afib.  Still notes some intermittent diarrhea. She enjoys walking.   Current Outpatient Medications on File Prior to Visit  Medication Sig Dispense Refill  . acetaminophen (TYLENOL) 500 MG tablet Take 500-1,000 mg by mouth every 6 (six) hours as needed (for  headache/pain.).    Marland Kitchen budesonide (RHINOCORT AQUA) 32 MCG/ACT nasal spray Place 1 spray into both nostrils daily.    Marland Kitchen CALCIUM CARBONATE-VIT D-MIN PO Take 2 tablets by mouth 3 (three) times a week.     Marland Kitchen CRANBERRY PO Take 1 tablet by mouth daily.    Marland Kitchen diltiazem (CARDIZEM CD) 180 MG 24 hr capsule Take 1 capsule (180 mg total) by mouth daily. (Patient taking differently: Take 180 mg by mouth at bedtime. ) 90 capsule 3  . ELIQUIS 5 MG TABS tablet take 1 tablet by mouth twice a day 180 tablet 1  . furosemide (LASIX) 40 MG tablet Take 1 tablet (40 mg total) by mouth daily. 90 tablet 3  . Glucosamine-Chondroitin (COSAMIN DS PO) Take 1 tablet by mouth 2 (two) times daily.    Marland Kitchen loratadine (CLARITIN) 10 MG tablet Take 10 mg by mouth daily as needed for allergies.     . metoprolol tartrate (LOPRESSOR) 25 MG tablet Take 1 tablet (25 mg total) by mouth 2 (two) times daily. 180 tablet 3  . ondansetron (ZOFRAN) 4 MG tablet Take 1 tablet (4 mg total) by mouth every 8 (eight) hours as needed for nausea or vomiting. 20 tablet 0  . pantoprazole (PROTONIX) 40 MG tablet Take 1 tablet (40 mg total) by mouth daily at 6 (six) AM. 90 tablet 3  . Polyethyl Glycol-Propyl Glycol (SYSTANE OP) Place 1 drop into both eyes 2 (two) times daily as needed (for dry eyes).     . pravastatin (PRAVACHOL) 40 MG tablet Take 1 tablet (40 mg total) by mouth daily. 90 tablet 3  . Probiotic Product (PROBIOTIC DAILY) CAPS Take 1 capsule by mouth daily.    Marland Kitchen  Psyllium (METAMUCIL FIBER PO) Take by mouth daily.    Marland Kitchen sulfamethoxazole-trimethoprim (BACTRIM,SEPTRA) 400-80 MG tablet Take 1 tablet by mouth 2 (two) times daily as needed.    . Vitamins-Lipotropics (LIPOFLAVONOID PO) Take 1 tablet by mouth 2 (two) times daily as needed (for ringing in ears).     No current facility-administered medications on file prior to visit.     Allergies  Allergen Reactions  . Lisinopril Other (See Comments)    DIZZY    Past Medical History:  Diagnosis  Date  . Arthritis    "hands, little in my feet" (06/22/2016)  . Candidiasis of vulva and vagina   . Cystocele, midline   . Disorder of bone and cartilage, unspecified   . Fibroids   . Hypertension   . Large hiatal hernia 06/23/2016  . Migraine    "none in the last few years" (06/22/2016)  . Other chronic cystitis   . Other sign and symptom in breast   . Paroxysmal atrial fibrillation ([place]) 11/30/2012  . Pneumonia    "I've had walking pneumonia a couple times"  (06/22/2016)  . Pneumonia dx'd 06/15/2016  . Rectal incontinence   . Stroke [facility]) 2014   hx of mini stroke   . TIA (transient ischemic attack) 2015    Past Surgical History:  Procedure Laterality Date  . CARDIOVERSION N/A 06/29/2016   Procedure: CARDIOVERSION;  Surgeon: Jerline Pain, MD;  Location: [facility] OR;  Service: Cardiovascular;  Laterality: N/A;  . CATARACT EXTRACTION W/ INTRAOCULAR LENS IMPLANT Left   . DILATION AND CURETTAGE OF UTERUS    . HYSTEROSCOPY W/D&C  03/16/1999  . INSERTION OF MESH N/A 08/20/2016   Procedure: INSERTION OF MESH;  Surgeon: Ralene Ok, MD;  Location: [facility] ORS;  Service: General;  Laterality: N/A;  . TEE WITHOUT CARDIOVERSION N/A 11/06/2012   Procedure: TRANSESOPHAGEAL ECHOCARDIOGRAM (TEE);  Surgeon: Lelon Perla, MD;  Location: [facility] ENDOSCOPY;  Service: Cardiovascular;  Laterality: N/A;  . TONSILLECTOMY  1937  . VAGINAL HYSTERECTOMY  09/2005   Anterior repair; Removal of urethral caruncle./notes 09/02/2015    Social History   Tobacco Use  Smoking Status Never Smoker  Smokeless Tobacco Never Used    Social History   Substance and Sexual Activity  Alcohol Use Yes  . Alcohol/week: 0.6 oz  . Types: 1 Glasses of wine per week   Comment: one glass of wine 1-2 days per week     Family History  Problem Relation Age of Onset  . Hypertension Mother   . Heart disease Father   . Heart disease Brother   . Emphysema Brother     Review of Systems: As noted in history of present illness. All  other systems were reviewed and are negative.  Physical Exam: BP (!) 148/80 (BP Location: Right Arm, Patient Position: Sitting, Cuff Size: Normal)   Pulse (!) 56   Ht 5' 5.5" (1.664 m)   Wt 155 lb (70.3 kg)   BMI 25.40 kg/m  GENERAL:  Well appearing EF in NAD HEENT:  PERRL, EOMI, sclera are clear. Oropharynx is clear. NECK:  No jugular venous distention, carotid upstroke brisk and symmetric, no bruits, no thyromegaly or adenopathy LUNGS:  Clear to auscultation bilaterally CHEST:  Unremarkable HEART:  RRR,  PMI not displaced or sustained,S1 and S2 within normal limits, no S3, no S4: no clicks, no rubs, no murmurs ABD:  Soft, nontender. BS +, no masses or bruits. No hepatomegaly, no splenomegaly EXT:  2 + pulses throughout, no edema,  no cyanosis no clubbing SKIN:  Warm and dry.  No rashes NEURO:  Alert and oriented x 3. Cranial nerves II through XII intact. PSYCH:  Cognitively intact    LABORATORY DATA: Lab Results  Component Value Date   WBC 9.4 04/26/2017   HGB 14.2 04/26/2017   HCT 44.0 04/26/2017   PLT 379 04/26/2017   GLUCOSE 111 (H) 04/26/2017   CHOL 161 04/26/2017   TRIG 129 04/26/2017   HDL 61 04/26/2017   LDLCALC 37 06/27/2016   ALT 15 04/26/2017   AST 20 04/26/2017   NA 142 04/26/2017   K 4.4 04/26/2017   CL 104 04/26/2017   CREATININE 0.94 (H) 04/26/2017   BUN 24 04/26/2017   CO2 28 04/26/2017   TSH 9.566 (H) 06/26/2016   INR 1.31 06/22/2016   HGBA1C 6.1 (H) 04/26/2017    Echo 07/04/16: Study Conclusions  - Left ventricle: The cavity size was normal. Wall thickness was   increased in a pattern of mild LVH. Systolic function was normal.   The estimated ejection fraction was in the range of 50% to 55%.   Wall motion was normal; there were no regional wall motion   abnormalities. Doppler parameters are consistent with abnormal   left ventricular relaxation (grade 1 diastolic dysfunction). - Mitral valve: Moderately calcified annulus. There was moderate    regurgitation. - Left atrium: The atrium was mildly dilated. - Right atrium: The atrium was mildly dilated.  Assessment / Plan: 1. TIA. On long-term anticoagulation with Eliquis. No bleeding problems.  2. Paroxysmal atrial fibrillation.  Continue rate control with metoprolol and cardizem.   3. Hypertension- BP well controlled.  4. Hyperlipidemia-on pravastatin with good results.  5. S/p Nissen fundoplication.  I will follow up in one year.

## 2017-05-13 ENCOUNTER — Ambulatory Visit: Payer: Medicare Other | Admitting: Cardiology

## 2017-05-13 ENCOUNTER — Encounter: Payer: Self-pay | Admitting: Cardiology

## 2017-05-13 VITALS — BP 148/80 | HR 56 | Ht 65.5 in | Wt 155.0 lb

## 2017-05-13 DIAGNOSIS — E785 Hyperlipidemia, unspecified: Secondary | ICD-10-CM

## 2017-05-13 DIAGNOSIS — I480 Paroxysmal atrial fibrillation: Secondary | ICD-10-CM | POA: Diagnosis not present

## 2017-05-13 DIAGNOSIS — I10 Essential (primary) hypertension: Secondary | ICD-10-CM

## 2017-05-13 NOTE — Patient Instructions (Signed)
Continue your current therapy  I will see you in one year   

## 2017-07-09 ENCOUNTER — Other Ambulatory Visit: Payer: Self-pay | Admitting: Physician Assistant

## 2017-07-11 NOTE — Telephone Encounter (Signed)
Please review for refill, Thanks !  

## 2017-07-13 ENCOUNTER — Other Ambulatory Visit: Payer: Self-pay | Admitting: Cardiology

## 2017-07-13 MED ORDER — FUROSEMIDE 40 MG PO TABS: 40.0000 mg | ORAL_TABLET | Freq: Every day | ORAL | 3 refills | Status: DC

## 2017-07-13 NOTE — Telephone Encounter (Signed)
Spoke with patient's husband who states his wife has been trying to get her lasix refilled since 3/15. Per chart review, there was a request for metoprolol tartrate & diltiazem to be refilled which was done on 3/25. Apologized for delay in getting this med filled but I refilled it while on the phone with husband, who is waiting at the pharmacy now.

## 2017-07-13 NOTE — Telephone Encounter (Signed)
New Message   *STAT* If patient is at the pharmacy, call can be transferred to refill team.   1. Which medications need to be refilled? (please list name of each medication and dose if known) furosemide (LASIX) 40 MG tablet  2. Which pharmacy/location (including street and city if local pharmacy) is medication to be sent to? [id number] - [place], [place] [place] AT [address]  3. Do they need a 30 day or 90 day supply?

## 2017-07-15 ENCOUNTER — Other Ambulatory Visit: Payer: Self-pay | Admitting: Cardiology

## 2017-07-18 ENCOUNTER — Other Ambulatory Visit: Payer: Self-pay | Admitting: *Deleted

## 2017-07-18 MED ORDER — PANTOPRAZOLE SODIUM 40 MG PO TBEC: 40.0000 mg | DELAYED_RELEASE_TABLET | Freq: Every day | ORAL | 3 refills | Status: DC

## 2017-09-07 DIAGNOSIS — N3020 Other chronic cystitis without hematuria: Secondary | ICD-10-CM | POA: Diagnosis not present

## 2017-10-11 ENCOUNTER — Other Ambulatory Visit: Payer: Self-pay | Admitting: Pharmacist Clinician (PhC)/ Clinical Pharmacy Specialist

## 2017-10-11 MED ORDER — APIXABAN 5 MG PO TABS: 5.0000 mg | ORAL_TABLET | Freq: Two times a day (BID) | ORAL | 1 refills | Status: DC

## 2017-10-12 ENCOUNTER — Other Ambulatory Visit: Payer: Self-pay | Admitting: Pharmacist Clinician (PhC)/ Clinical Pharmacy Specialist

## 2017-10-12 MED ORDER — APIXABAN 5 MG PO TABS: 5.0000 mg | ORAL_TABLET | Freq: Two times a day (BID) | ORAL | 1 refills | Status: DC

## 2018-01-21 IMAGING — CR DG CHEST 1V PORT
1 series · 1 of 1 positions shown · non-contrast
Comparison: 06/25/2016 and 06/21/2016 radiographs

CLINICAL DATA: Shortness of breath. Nausea, vomiting and chest
pain.

EXAM:
PORTABLE CHEST 1 VIEW

[AP]
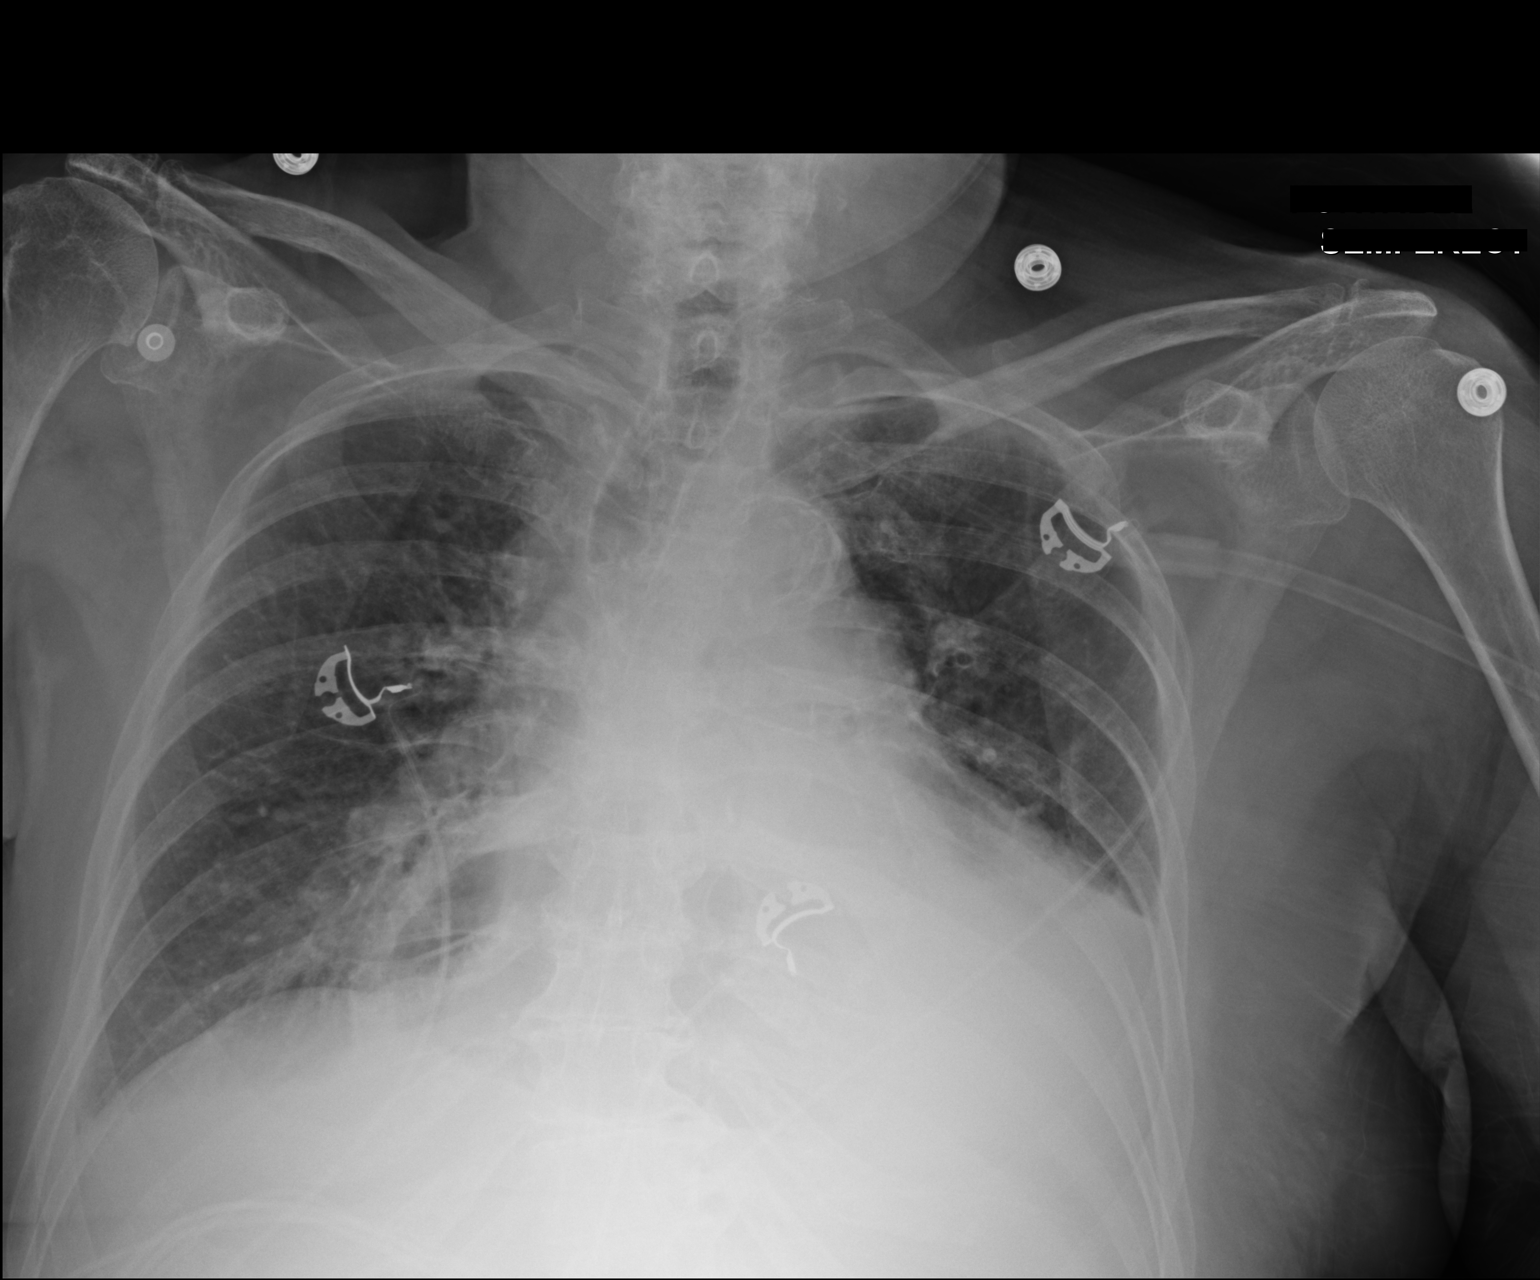

[1 of 1 positions shown; findings below may reference images not displayed]

FINDINGS: Cardiomegaly, large hiatal hernia and pulmonary vascular congestion
again noted.

Left lower lung atelectasis/consolidation appear slightly increased.
There may be a left pleural effusion present.

There is no evidence of pneumothorax.

Mild right basilar atelectasis again identified.
IMPRESSION: Slightly increased left lower lung consolidation/atelectasis with
possible left pleural effusion.

Cardiomegaly and large hiatal hernia again noted.

## 2018-01-25 IMAGING — DX DG CHEST 2V
2 series · 2 of 2 positions shown · non-contrast
Comparison: June 26, 2016

CLINICAL DATA: Shortness of breath and left-sided pleural effusion

EXAM:
CHEST  2 VIEW

[chest pa]
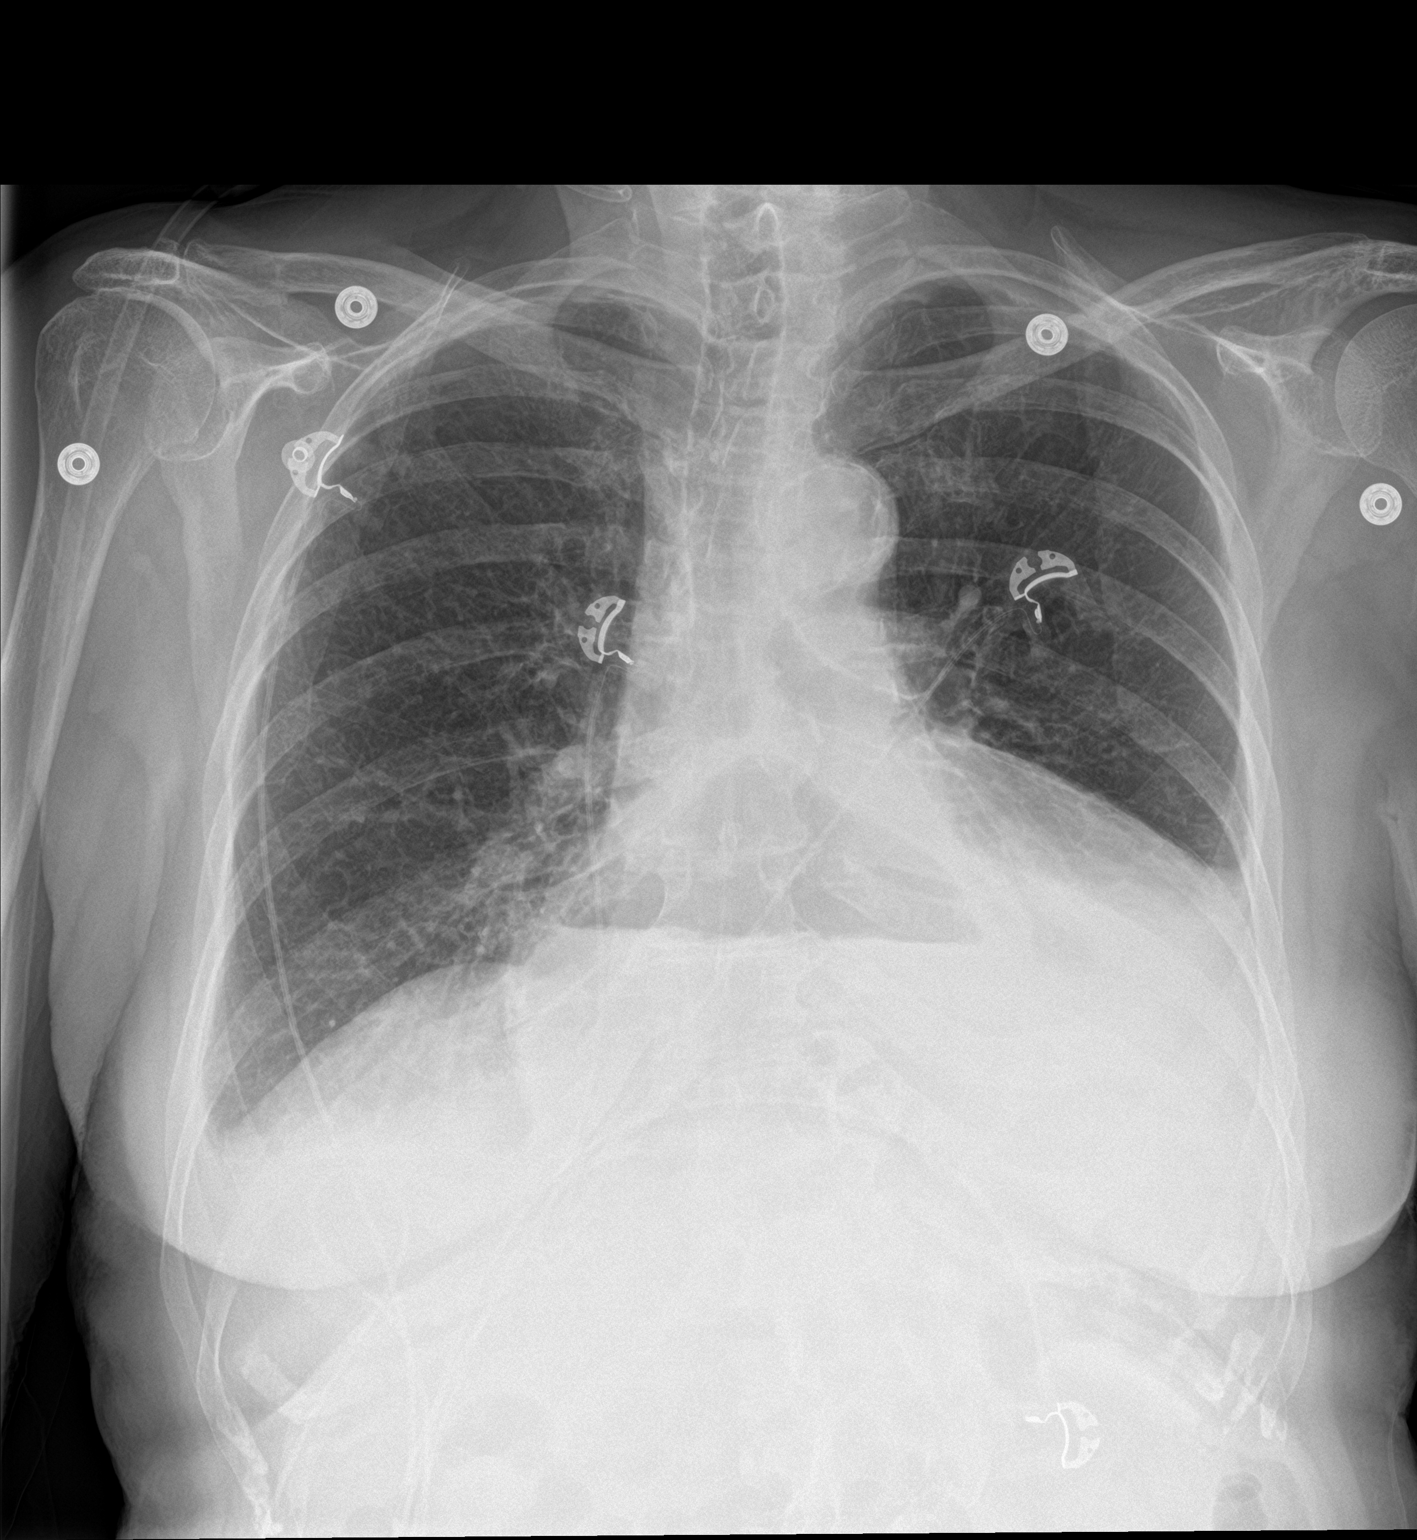

[chest lat]
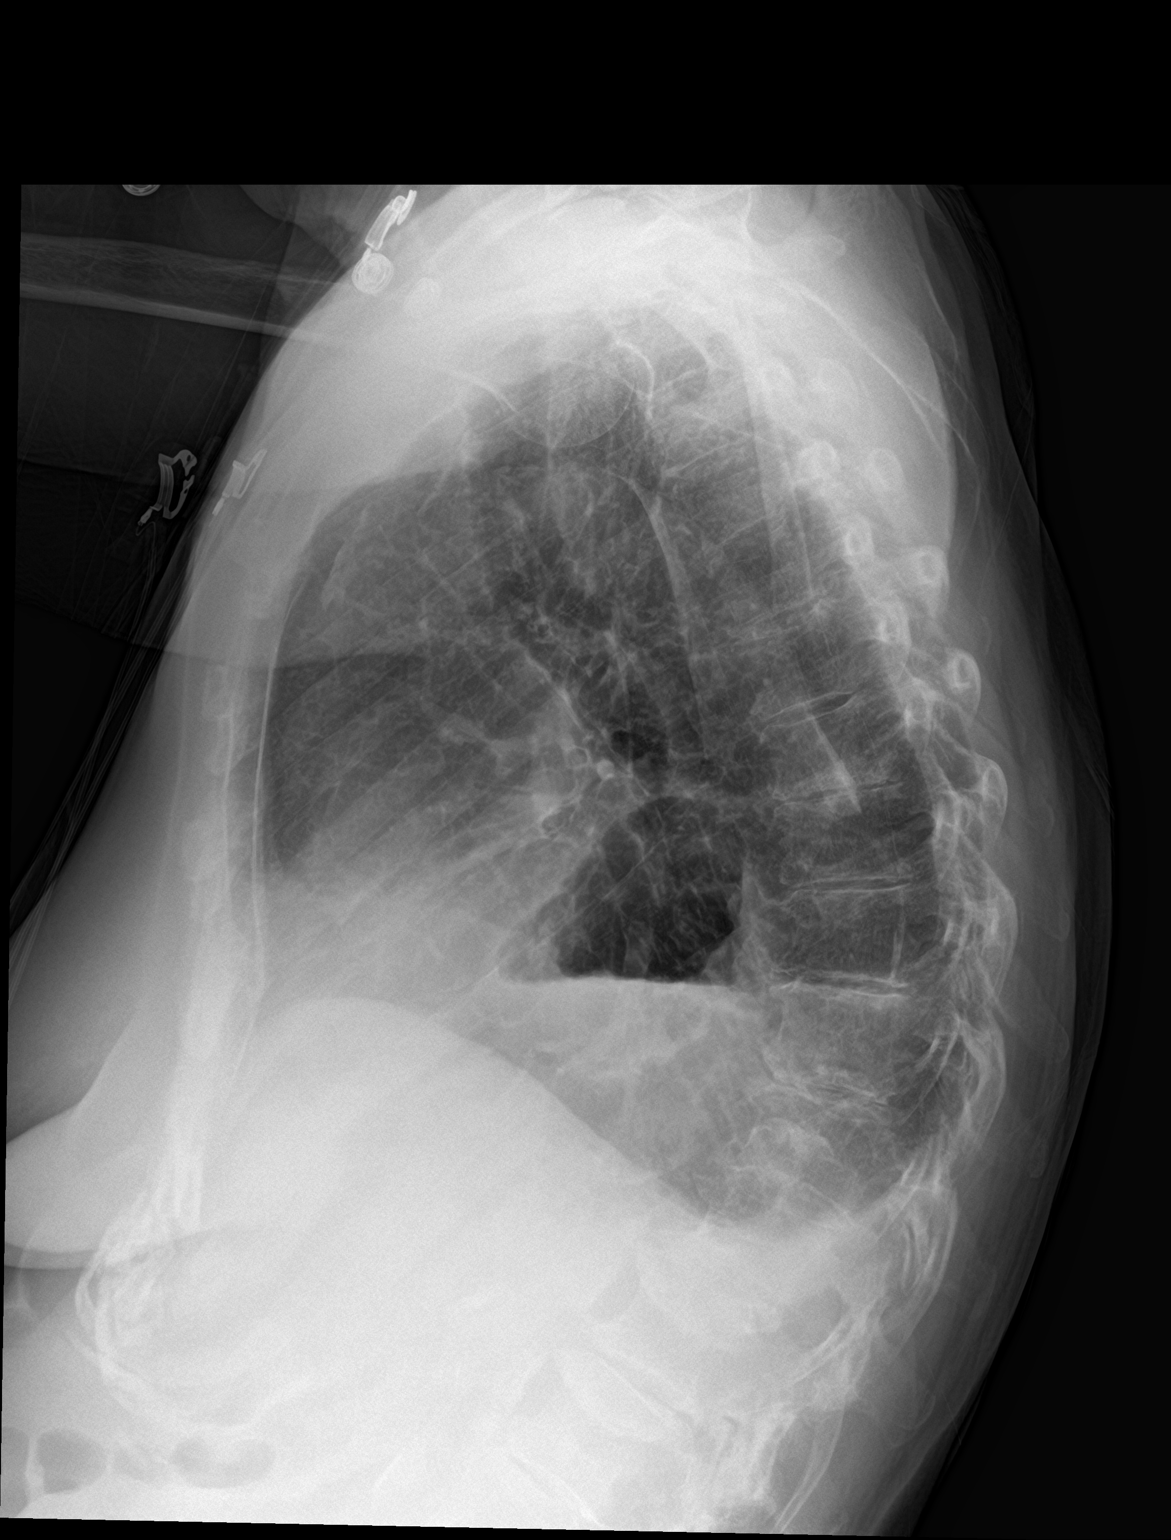

[2 of 2 positions shown; findings below may reference images not displayed]

FINDINGS: There is a large hiatal type hernia. There is persistent airspace
consolidation in the left lower lobe with fairly small left pleural
effusion. There is a minimal right pleural effusion. Lungs elsewhere
clear. Heart is slightly enlarged with pulmonary vascularity within
normal limits. No adenopathy. There is atherosclerotic calcification
in the aorta. There is degenerative change in the thoracic spine.
IMPRESSION: Persistent airspace consolidation left lower lobe with small left
pleural effusion. Minimal right pleural effusion. Lungs elsewhere
clear. Stable cardiac prominence. Sizable hiatal hernia present.
There is aortic atherosclerosis.

## 2018-02-27 DIAGNOSIS — H2511 Age-related nuclear cataract, right eye: Secondary | ICD-10-CM | POA: Diagnosis not present

## 2018-02-27 DIAGNOSIS — H524 Presbyopia: Secondary | ICD-10-CM | POA: Diagnosis not present

## 2018-03-13 IMAGING — DX DG CHEST 2V
2 series · 2 of 2 positions shown · non-contrast
Comparison: 06/30/2016

CLINICAL DATA: Preoperative evaluation for upcoming hiatal hernia
surgery

EXAM:
CHEST  2 VIEW

[chest pa]
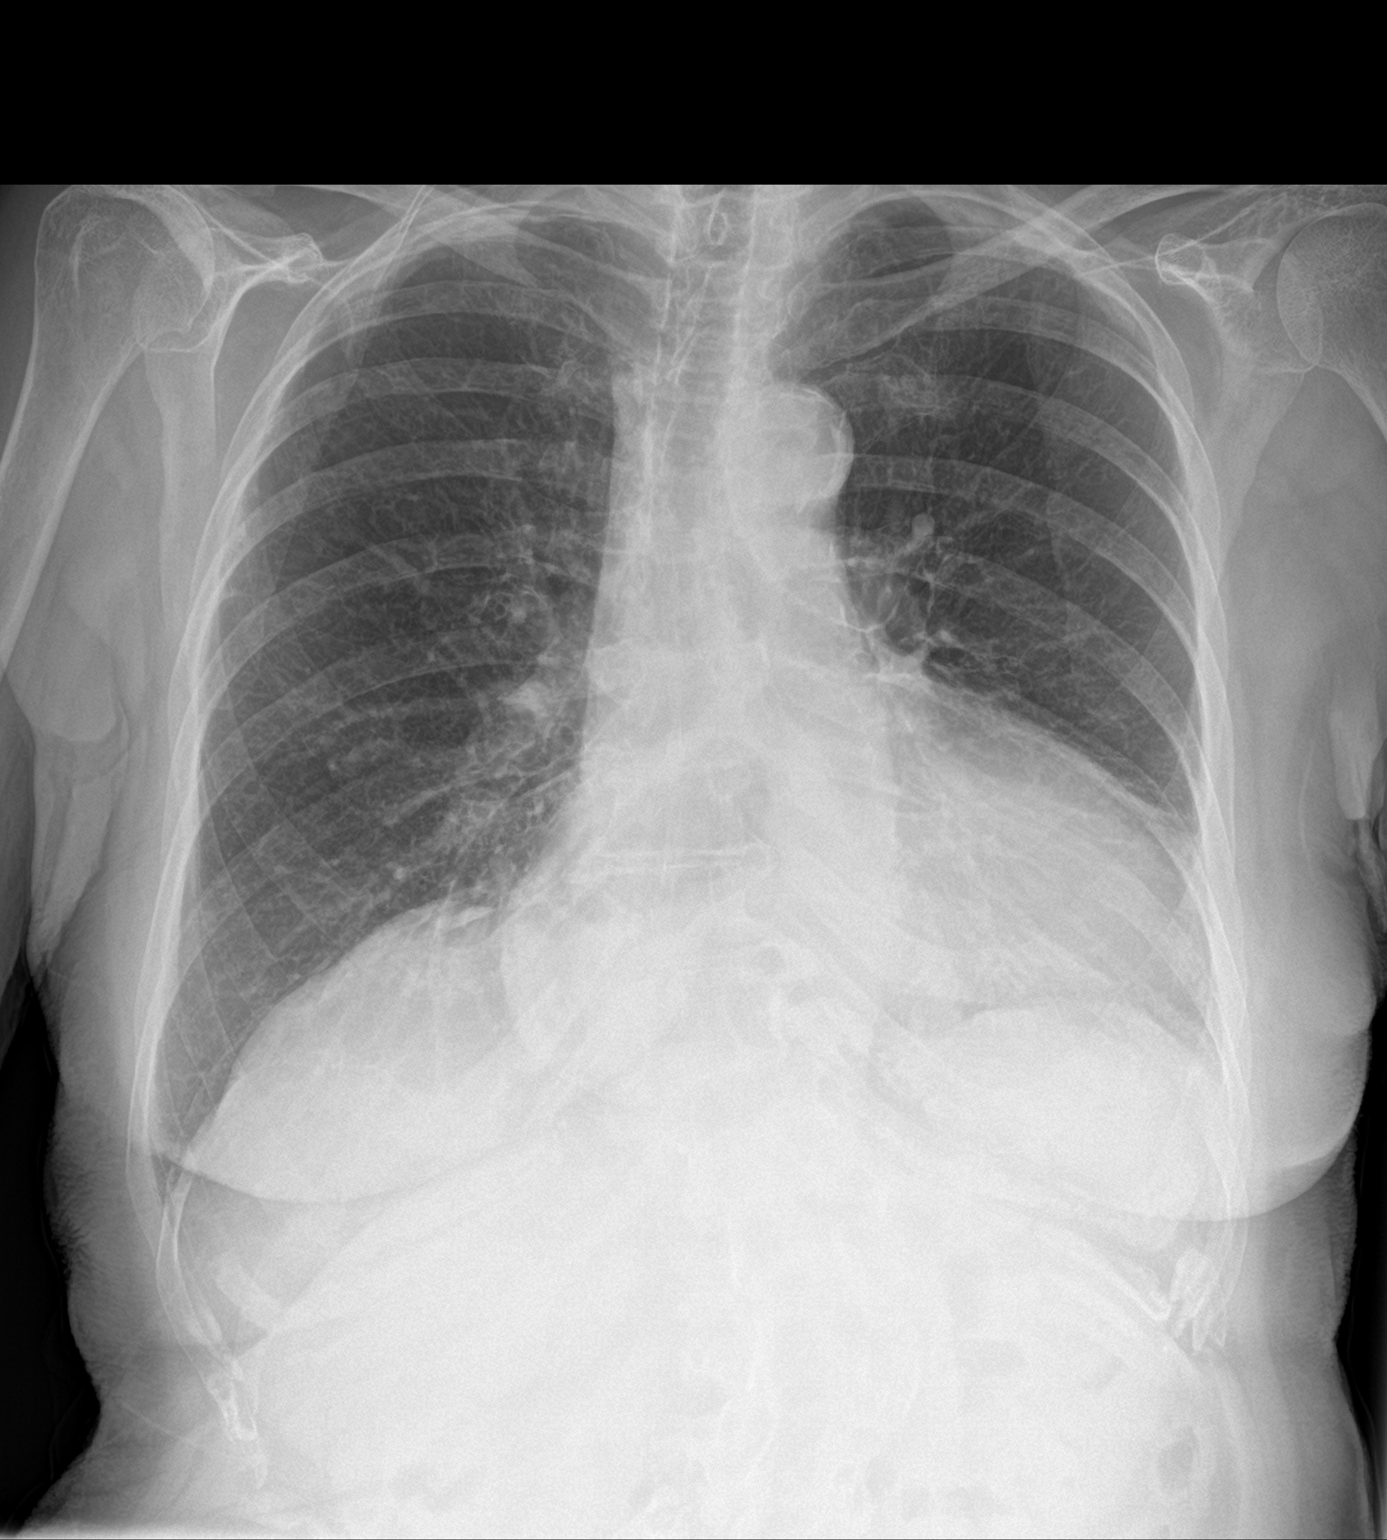

[chest lat]
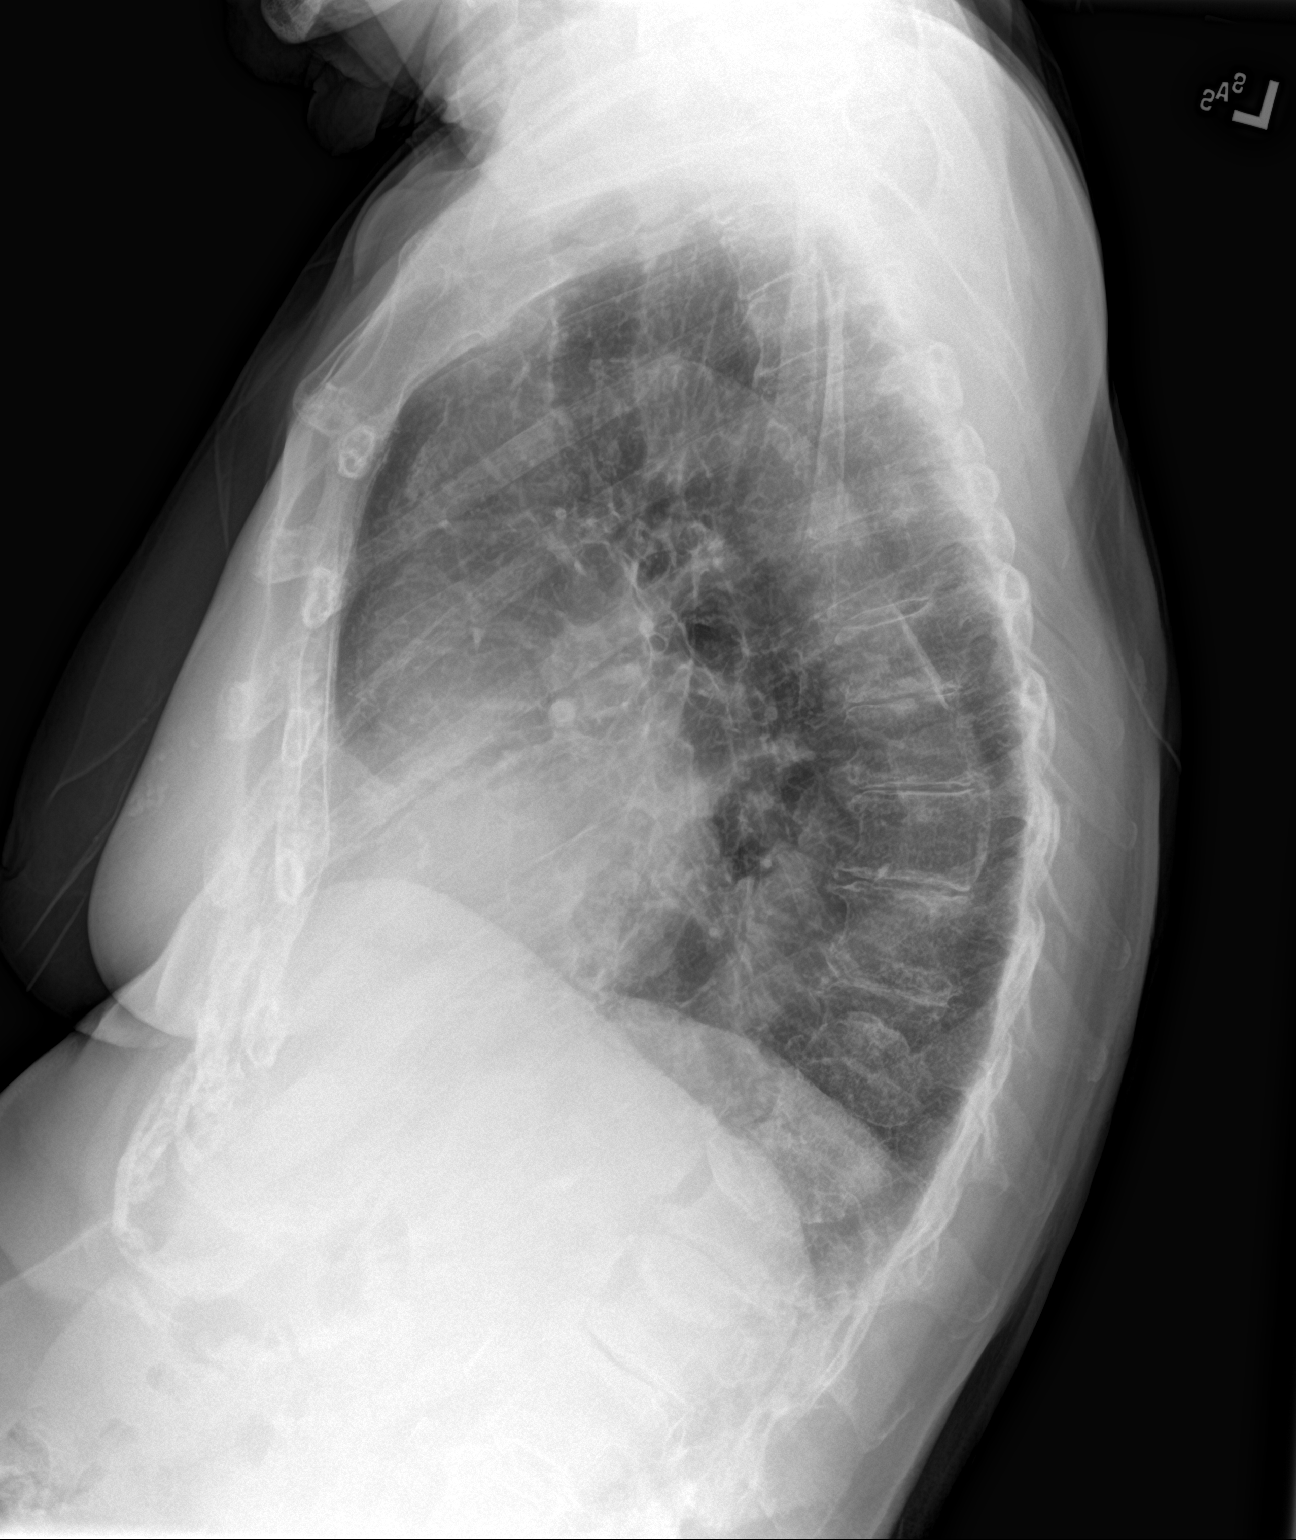

[2 of 2 positions shown; findings below may reference images not displayed]

FINDINGS: Cardiac shadow is enlarged. Aortic calcifications are again noted.
Hiatal hernia is again seen and stable. The lungs are clear
bilaterally. Degenerative changes of thoracic spine are noted.
IMPRESSION: No acute abnormality seen.

## 2018-04-24 ENCOUNTER — Encounter: Payer: Self-pay | Admitting: Family Medicine

## 2018-04-24 ENCOUNTER — Ambulatory Visit: Payer: Medicare Other | Admitting: Family Medicine

## 2018-04-24 VITALS — BP 120/78 | HR 70 | Temp 98.0°F | Wt 157.0 lb

## 2018-04-24 DIAGNOSIS — I700 Atherosclerosis of aorta: Secondary | ICD-10-CM | POA: Diagnosis not present

## 2018-04-24 DIAGNOSIS — I10 Essential (primary) hypertension: Secondary | ICD-10-CM | POA: Diagnosis not present

## 2018-04-24 DIAGNOSIS — I5031 Acute diastolic (congestive) heart failure: Secondary | ICD-10-CM | POA: Diagnosis not present

## 2018-04-24 DIAGNOSIS — I480 Paroxysmal atrial fibrillation: Secondary | ICD-10-CM

## 2018-04-24 DIAGNOSIS — J309 Allergic rhinitis, unspecified: Secondary | ICD-10-CM

## 2018-04-24 DIAGNOSIS — E785 Hyperlipidemia, unspecified: Secondary | ICD-10-CM

## 2018-04-24 DIAGNOSIS — Z9889 Other specified postprocedural states: Secondary | ICD-10-CM

## 2018-04-24 DIAGNOSIS — Z87448 Personal history of other diseases of urinary system: Secondary | ICD-10-CM

## 2018-04-24 DIAGNOSIS — R7989 Other specified abnormal findings of blood chemistry: Secondary | ICD-10-CM

## 2018-04-24 DIAGNOSIS — Z8673 Personal history of transient ischemic attack (TIA), and cerebral infarction without residual deficits: Secondary | ICD-10-CM

## 2018-04-24 MED ORDER — PANTOPRAZOLE SODIUM 40 MG PO TBEC: 40.0000 mg | DELAYED_RELEASE_TABLET | Freq: Every day | ORAL | 3 refills | Status: DC

## 2018-04-24 MED ORDER — PRAVASTATIN SODIUM 40 MG PO TABS: 40.0000 mg | ORAL_TABLET | Freq: Every day | ORAL | 3 refills | Status: DC

## 2018-04-24 NOTE — Progress Notes (Signed)
   Subjective:    Patient ID: [name], female    DOB: June 09, 1930, 85 y.o.   MRN: [id number]  HPI She is here for a med check appointment.  She does have an appointment scheduled with her cardiologist, Dr. [name] in the near future.  Does have a history of CHF as well as PAF.  She has concerns over her diuretic causing her to urinate excessively.  She continues on her other cardiac meds without difficulty.  Does have a previous history of abnormal TSH.  Her allergies are under good control.  She continues on Pravachol for her lipids.  She is also taking Protonix and getting good results with that.  No present difficulty with reflux type symptoms.   Review of Systems     Objective:   Physical Exam Alert and in no distress. Tympanic membranes and canals are normal. Pharyngeal area is normal. Neck is supple without adenopathy or thyromegaly. Cardiac exam shows a regular sinus rhythm without murmurs or gallops. Lungs are clear to auscultation.        Assessment & Plan:  Essential hypertension, benign - Plan: CBC with Differential/Platelet, Comprehensive metabolic panel  Acute diastolic CHF (congestive heart failure) ([place]) - Plan: CBC with Differential/Platelet, Comprehensive metabolic panel, Lipid panel  Allergic rhinitis, mild  Abnormal TSH - Plan: TSH  H/O chronic cystitis  Hyperlipidemia with target LDL less than 130 - Plan: Lipid panel, pravastatin (PRAVACHOL) 40 MG tablet  Intermittent atrial fibrillation (HCC)  Thoracic aorta atherosclerosis (HCC) - Plan: CBC with Differential/Platelet, Comprehensive metabolic panel, Lipid panel  History of TIA (transient ischemic attack)  S/P Nissen fundoplication (without gastrostomy tube) procedure - Plan: pantoprazole (PROTONIX) 40 MG tablet  I will defer to Dr. [name] concerning stopping her HCTZ.  Did recommend she try to take Protonix on an every other day to see if it will still help control her symptoms.

## 2018-04-25 LAB — CBC WITH DIFFERENTIAL/PLATELET
Basophils Absolute: 0.1 10*3/uL (ref 0.0–0.2)
Basos: 1 %
EOS (ABSOLUTE): 0.1 10*3/uL (ref 0.0–0.4)
EOS: 1 %
HEMATOCRIT: 44.8 % (ref 34.0–46.6)
HEMOGLOBIN: 14.8 g/dL (ref 11.1–15.9)
IMMATURE GRANS (ABS): 0 10*3/uL (ref 0.0–0.1)
Immature Granulocytes: 0 %
Lymphocytes Absolute: 2.5 10*3/uL (ref 0.7–3.1)
Lymphs: 29 %
MCH: 28.6 pg (ref 26.6–33.0)
MCHC: 33 g/dL (ref 31.5–35.7)
MCV: 87 fL (ref 79–97)
MONOCYTES: 7 %
Monocytes Absolute: 0.6 10*3/uL (ref 0.1–0.9)
NEUTROS ABS: 5.4 10*3/uL (ref 1.4–7.0)
Neutrophils: 62 %
Platelets: 342 10*3/uL (ref 150–450)
RBC: 5.17 x10E6/uL (ref 3.77–5.28)
RDW: 13.7 % (ref 11.7–15.4)
WBC: 8.8 10*3/uL (ref 3.4–10.8)

## 2018-04-25 LAB — COMPREHENSIVE METABOLIC PANEL
ALBUMIN: 4.5 g/dL (ref 3.5–4.7)
ALK PHOS: 90 IU/L (ref 39–117)
ALT: 19 IU/L (ref 0–32)
AST: 22 IU/L (ref 0–40)
Albumin/Globulin Ratio: 1.7 (ref 1.2–2.2)
BUN / CREAT RATIO: 13 (ref 12–28)
BUN: 12 mg/dL (ref 8–27)
Bilirubin Total: 0.4 mg/dL (ref 0.0–1.2)
CO2: 21 mmol/L (ref 20–29)
Calcium: 9.7 mg/dL (ref 8.7–10.3)
Chloride: 102 mmol/L (ref 96–106)
Creatinine, Ser: 0.91 mg/dL (ref 0.57–1.00)
GFR calc non Af Amer: 58 mL/min/{1.73_m2} — ABNORMAL LOW (ref >59)
GFR, EST AFRICAN AMERICAN: 67 mL/min/{1.73_m2} (ref >59)
GLOBULIN, TOTAL: 2.7 g/dL (ref 1.5–4.5)
Glucose: 116 mg/dL — ABNORMAL HIGH (ref 65–99)
Potassium: 4.4 mmol/L (ref 3.5–5.2)
SODIUM: 141 mmol/L (ref 134–144)
TOTAL PROTEIN: 7.2 g/dL (ref 6.0–8.5)

## 2018-04-25 LAB — LIPID PANEL
CHOLESTEROL TOTAL: 155 mg/dL (ref 100–199)
Chol/HDL Ratio: 3.1 ratio (ref 0.0–4.4)
HDL: 50 mg/dL (ref >39)
LDL Calculated: 71 mg/dL (ref 0–99)
Triglycerides: 171 mg/dL — ABNORMAL HIGH (ref 0–149)
VLDL CHOLESTEROL CAL: 34 mg/dL (ref 5–40)

## 2018-04-25 LAB — TSH: TSH: 4.51 u[IU]/mL — ABNORMAL HIGH (ref 0.450–4.500)

## 2018-04-28 LAB — HGB A1C W/O EAG: Hgb A1c MFr Bld: 6.1 % — ABNORMAL HIGH (ref 4.8–5.6)

## 2018-05-14 NOTE — Progress Notes (Signed)
[name] Date of Birth: [birth year] Medical Record #[id number]  History of Present Illness: Mrs. [name] is seen for followup for history of TIA and paroxysmal atrial fibrillation.  She is on anticoagulation with Eliquis.  She is on metoprolol and cardizem for rate control and prior event monitor showed good control when she is in Afib.  She  was admitted in March 2018 with nausea vomiting and the chest pain. Initial EKG showed normal sinus rhythm, CT of abdomen and pelvis showed large intrathoracic gastric hernia, no wall thickening all pneumatosis. Finding most likely representing spontaneous interval reduction of gastric volvulus. KUB was unremarkable. Ultrasound of abdomen does not show any gallstones. During the hospitalization, she developed worsening shortness of breath after aggressive IV hydration. She was treated with IV Lasix afterward with good urinary output. She was also found to be hypothyroid and received thyroid replacement. Her hospital stay was complicated by atrial fibrillation with RVR. She was  on eliquis. Troponin was elevated and a BNP was 836. Cardiology was consulted for atrial fibrillation and elevated troponin. Elevated troponin was felt to be related to demand ischemia with heart failure and the pneumonia. Echocardiogram obtained on 06/28/2016 showed EF 48-18%, grade 1 diastolic dysfunction. She required cardioversion on 06/29/2016 but was unable to maintain NSR. When seen in April she was back in NSR.   On 08/21/16 she was readmitted and underwent Nissen fundoplication without complication.   On follow up today she is doing very well. Notes very rare brief palpitations. She denies any recurrent Afib.  She is now living at [facility] No edema, dyspnea, chest pain. Weight is stable.   Current Outpatient Medications on File Prior to Visit  Medication Sig Dispense Refill  . acetaminophen (TYLENOL) 500 MG tablet Take 500-1,000 mg by mouth every 6 (six) hours as needed (for  headache/pain.).    Marland Kitchen apixaban (ELIQUIS) 5 MG TABS tablet Take 1 tablet (5 mg total) by mouth 2 (two) times daily. 180 tablet 1  . budesonide (RHINOCORT AQUA) 32 MCG/ACT nasal spray Place 1 spray into both nostrils daily.    Marland Kitchen CALCIUM CARBONATE-VIT D-MIN PO Take 2 tablets by mouth 3 (three) times a week.     Marland Kitchen CRANBERRY PO Take 1 tablet by mouth daily.    Marland Kitchen DILT-XR 180 MG 24 hr capsule TAKE ONE CAPSULE BY MOUTH DAILY 90 capsule 3  . diltiazem (CARDIZEM CD) 180 MG 24 hr capsule Take 1 capsule (180 mg total) by mouth daily. (Patient taking differently: Take 180 mg by mouth at bedtime. ) 90 capsule 3  . Glucosamine-Chondroitin (COSAMIN DS PO) Take 1 tablet by mouth 2 (two) times daily.    Marland Kitchen loratadine (CLARITIN) 10 MG tablet Take 10 mg by mouth daily as needed for allergies.     . metoprolol tartrate (LOPRESSOR) 25 MG tablet TAKE 1 TABLET BY MOUTH TWICE DAILY 180 tablet 3  . ondansetron (ZOFRAN) 4 MG tablet Take 1 tablet (4 mg total) by mouth every 8 (eight) hours as needed for nausea or vomiting. 20 tablet 0  . pantoprazole (PROTONIX) 40 MG tablet Take 1 tablet (40 mg total) by mouth daily. (Patient taking differently: Take 40 mg by mouth every other day. ) 90 tablet 3  . Polyethyl Glycol-Propyl Glycol (SYSTANE OP) Place 1 drop into both eyes 2 (two) times daily as needed (for dry eyes).     . pravastatin (PRAVACHOL) 40 MG tablet Take 1 tablet (40 mg total) by mouth daily. 90 tablet 3  . Probiotic  Product (PROBIOTIC DAILY) CAPS Take 1 capsule by mouth daily.    . Psyllium (METAMUCIL FIBER PO) Take by mouth daily.    Marland Kitchen sulfamethoxazole-trimethoprim (BACTRIM,SEPTRA) 400-80 MG tablet Take 1 tablet by mouth 2 (two) times daily as needed.    . Vitamins-Lipotropics (LIPOFLAVONOID PO) Take 1 tablet by mouth 2 (two) times daily as needed (for ringing in ears).     No current facility-administered medications on file prior to visit.     Allergies  Allergen Reactions  . Lisinopril Other (See Comments)     DIZZY    Past Medical History:  Diagnosis Date  . Arthritis    "hands, little in my feet" (06/22/2016)  . Candidiasis of vulva and vagina   . Cystocele, midline   . Disorder of bone and cartilage, unspecified   . Fibroids   . Hypertension   . Large hiatal hernia 06/23/2016  . Migraine    "none in the last few years" (06/22/2016)  . Other chronic cystitis   . Other sign and symptom in breast   . Paroxysmal atrial fibrillation ([place]) 11/30/2012  . Pneumonia    "I've had walking pneumonia a couple times"  (06/22/2016)  . Pneumonia dx'd 06/15/2016  . Rectal incontinence   . Stroke [facility]) 2014   hx of mini stroke   . TIA (transient ischemic attack) 2015    Past Surgical History:  Procedure Laterality Date  . CARDIOVERSION N/A 06/29/2016   Procedure: CARDIOVERSION;  Surgeon: Jerline Pain, MD;  Location: [facility] OR;  Service: Cardiovascular;  Laterality: N/A;  . CATARACT EXTRACTION W/ INTRAOCULAR LENS IMPLANT Left   . DILATION AND CURETTAGE OF UTERUS    . HYSTEROSCOPY W/D&C  03/16/1999  . INSERTION OF MESH N/A 08/20/2016   Procedure: INSERTION OF MESH;  Surgeon: Ralene Ok, MD;  Location: [facility] ORS;  Service: General;  Laterality: N/A;  . TEE WITHOUT CARDIOVERSION N/A 11/06/2012   Procedure: TRANSESOPHAGEAL ECHOCARDIOGRAM (TEE);  Surgeon: Lelon Perla, MD;  Location: [facility] ENDOSCOPY;  Service: Cardiovascular;  Laterality: N/A;  . TONSILLECTOMY  1937  . VAGINAL HYSTERECTOMY  09/2005   Anterior repair; Removal of urethral caruncle./notes 09/02/2015    Social History   Tobacco Use  Smoking Status Never Smoker  Smokeless Tobacco Never Used    Social History   Substance and Sexual Activity  Alcohol Use Yes  . Alcohol/week: 1.0 standard drinks  . Types: 1 Glasses of wine per week   Comment: one glass of wine 1-2 days per week     Family History  Problem Relation Age of Onset  . Hypertension Mother   . Heart disease Father   . Heart disease Brother   . Emphysema Brother      Review of Systems: As noted in history of present illness. All other systems were reviewed and are negative.  Physical Exam: BP 134/82   Pulse 70   Ht 5' 5.5" (1.664 m)   Wt 157 lb 6.4 oz (71.4 kg)   BMI 25.79 kg/m  GENERAL:  Well appearing WF in NAD HEENT:  PERRL, EOMI, sclera are clear. Oropharynx is clear. NECK:  No jugular venous distention, carotid upstroke brisk and symmetric, no bruits, no thyromegaly or adenopathy LUNGS:  Clear to auscultation bilaterally CHEST:  Unremarkable HEART:  RRR,  PMI not displaced or sustained,S1 and S2 within normal limits, no S3, no S4: no clicks, no rubs, no murmurs ABD:  Soft, nontender. BS +, no masses or bruits. No hepatomegaly, no splenomegaly EXT:  2 +  pulses throughout, no edema, no cyanosis no clubbing SKIN:  Warm and dry.  No rashes NEURO:  Alert and oriented x 3. Cranial nerves II through XII intact. PSYCH:  Cognitively intact      LABORATORY DATA: Lab Results  Component Value Date   WBC 8.8 04/24/2018   HGB 14.8 04/24/2018   HCT 44.8 04/24/2018   PLT 342 04/24/2018   GLUCOSE 116 (H) 04/24/2018   CHOL 155 04/24/2018   TRIG 171 (H) 04/24/2018   HDL 50 04/24/2018   LDLCALC 71 04/24/2018   ALT 19 04/24/2018   AST 22 04/24/2018   NA 141 04/24/2018   K 4.4 04/24/2018   CL 102 04/24/2018   CREATININE 0.91 04/24/2018   BUN 12 04/24/2018   CO2 21 04/24/2018   TSH 4.510 (H) 04/24/2018   INR 1.31 06/22/2016   HGBA1C 6.1 (H) 04/24/2018    Ecg today shows NSR with possible septal infarct. No acute change. I have personally reviewed and interpreted this study.   Echo 07/04/16: Study Conclusions  - Left ventricle: The cavity size was normal. Wall thickness was   increased in a pattern of mild LVH. Systolic function was normal.   The estimated ejection fraction was in the range of 50% to 55%.   Wall motion was normal; there were no regional wall motion   abnormalities. Doppler parameters are consistent with abnormal    left ventricular relaxation (grade 1 diastolic dysfunction). - Mitral valve: Moderately calcified annulus. There was moderate   regurgitation. - Left atrium: The atrium was mildly dilated. - Right atrium: The atrium was mildly dilated.  Assessment / Plan: 1. TIA. On long-term anticoagulation with Eliquis. Tolerating well.   2. Paroxysmal atrial fibrillation.  Continue rate control with metoprolol and cardizem. Maintaining NSR.  3. Hypertension- BP well controlled.  4. Hyperlipidemia-on pravastatin with good results.  5. S/p Nissen fundoplication.  We will discontinue her lasix now. Monitor for any signs of increased swelling.   I will follow up in one year.

## 2018-05-18 ENCOUNTER — Encounter: Payer: Self-pay | Admitting: Cardiology

## 2018-05-18 ENCOUNTER — Ambulatory Visit: Payer: Medicare Other | Admitting: Cardiology

## 2018-05-18 VITALS — BP 134/82 | HR 70 | Ht 65.5 in | Wt 157.4 lb

## 2018-05-18 DIAGNOSIS — I480 Paroxysmal atrial fibrillation: Secondary | ICD-10-CM

## 2018-05-18 DIAGNOSIS — E785 Hyperlipidemia, unspecified: Secondary | ICD-10-CM

## 2018-05-18 DIAGNOSIS — I10 Essential (primary) hypertension: Secondary | ICD-10-CM

## 2018-05-18 NOTE — Patient Instructions (Signed)
Stop taking lasix  Continue your other therapy   

## 2018-06-22 DIAGNOSIS — N390 Urinary tract infection, site not specified: Secondary | ICD-10-CM | POA: Diagnosis not present

## 2018-06-26 ENCOUNTER — Encounter: Payer: Self-pay | Admitting: Family Medicine

## 2018-06-26 ENCOUNTER — Ambulatory Visit: Payer: Medicare Other | Admitting: Family Medicine

## 2018-06-26 VITALS — BP 132/84 | HR 67 | Temp 97.6°F | Wt 157.0 lb

## 2018-06-26 DIAGNOSIS — R519 Headache, unspecified: Secondary | ICD-10-CM

## 2018-06-26 DIAGNOSIS — R51 Headache: Secondary | ICD-10-CM | POA: Diagnosis not present

## 2018-06-26 NOTE — Progress Notes (Signed)
   Subjective:    Patient ID: [name], female    DOB: June 09, 1930, 85 y.o.   MRN: [id number]  HPI She is here for evaluation of a headache.  She has had this intermittently for the last 10 days.  It is either in the left or right parietal area.  She describes it as sharp and stabbing but the stabbing only lasts a few seconds.  She also complains of slightly watery eyes.  No nasal congestion ,fever, chills, sneezing, sore throat or earache.   Review of Systems     Objective:   Physical Exam Alert and in no distress.  No tenderness over temporal arteries.  TMJs normal.  Tympanic membranes and canals are normal. Pharyngeal area is normal. Neck is supple without adenopathy or thyromegaly. Cardiac exam shows a regular sinus rhythm without murmurs or gallops. Lungs are clear to auscultation.        Assessment & Plan:  Nonintractable headache, unspecified chronicity pattern, unspecified headache type Recommend using Tylenol regularly for the next week and also pay attention to see if the headache pattern change at all.  I explained that this point I did not see anything of major significance.  She was comfortable with that.

## 2018-06-26 NOTE — Patient Instructions (Signed)
You can take 2 Tylenol 4 times per day for the rest the week and see if that will help prevent it from occurring

## 2018-06-27 ENCOUNTER — Ambulatory Visit: Payer: Medicare Other | Admitting: Family Medicine

## 2018-07-02 ENCOUNTER — Other Ambulatory Visit: Payer: Self-pay | Admitting: Cardiology

## 2018-10-03 ENCOUNTER — Other Ambulatory Visit: Payer: Self-pay | Admitting: Pharmacist

## 2018-10-03 MED ORDER — APIXABAN 5 MG PO TABS: 5.0000 mg | ORAL_TABLET | Freq: Two times a day (BID) | ORAL | 1 refills | Status: DC

## 2018-10-03 NOTE — Telephone Encounter (Signed)
Dx AFib 85yo Female 71.2kg Scr= 0.91 Last OV 05/18/2018 Dr [name]

## 2018-12-04 ENCOUNTER — Telehealth: Payer: Self-pay | Admitting: Family Medicine

## 2018-12-04 NOTE — Telephone Encounter (Signed)
appt was made KH 

## 2018-12-04 NOTE — Telephone Encounter (Signed)
Virtual visit 

## 2018-12-04 NOTE — Telephone Encounter (Signed)
Pt called and said she has been under a lot of stress lately and over the weekend the nurses at [facility] feel like she had an anxiety attack. Pt wanted to know if she could get something for anxiety. Pt advised that she may need a virtual/phone visit before the medication is prescribed she can be reached at [phone]

## 2018-12-05 ENCOUNTER — Other Ambulatory Visit: Payer: Self-pay

## 2018-12-05 ENCOUNTER — Ambulatory Visit (INDEPENDENT_AMBULATORY_CARE_PROVIDER_SITE_OTHER): Payer: Medicare Other | Admitting: Family Medicine

## 2018-12-05 DIAGNOSIS — F419 Anxiety disorder, unspecified: Secondary | ICD-10-CM

## 2018-12-05 MED ORDER — ALPRAZOLAM 0.25 MG PO TABS: 0.2500 mg | ORAL_TABLET | Freq: Two times a day (BID) | ORAL | 0 refills | Status: DC | PRN

## 2018-12-05 NOTE — Progress Notes (Signed)
   Subjective:    Patient ID: [name], female    DOB: 04/20/1930, 85 y.o.   MRN: [id number]  HPI Documentation for virtual telephone encounter.  Documentation for virtual audio Interactive audio and video telecommunications were attempted between this provider and patient, however she did not have access to video capability.  We continued and completed visit with audio only. The patient was located at home. The provider was located in the office. The patient did consent to this visit and is aware of possible charges through their insurance for this visit. The other persons participating in this telemedicine service were none. Time spent on call was 10 minutes This virtual service is not related to other E/M service within previous 7 days. She called concerning difficulty with anxiety.  Her husband is in the hospital and not doing well.  She did have difficulty getting a hold of him which increased her anxiety.  She is essentially being sequestered in her present living environment.  This is interfered with her anxiety and with sleep at night.  Review of Systems     Objective:   Physical Exam Alert and in no distress otherwise not examined       Assessment & Plan:  Anxiety - Plan: ALPRAZolam (XANAX) 0.25 MG tablet, I discussed the use of the Xanax to help with anxiety during the day and also said she can use it at night.  Hopefully when her husband comes back, things will quiet down.

## 2018-12-28 ENCOUNTER — Other Ambulatory Visit: Payer: Self-pay | Admitting: Family Medicine

## 2018-12-28 DIAGNOSIS — Z9889 Other specified postprocedural states: Secondary | ICD-10-CM

## 2019-01-04 ENCOUNTER — Other Ambulatory Visit: Payer: Self-pay | Admitting: Family Medicine

## 2019-01-04 DIAGNOSIS — F419 Anxiety disorder, unspecified: Secondary | ICD-10-CM

## 2019-02-05 ENCOUNTER — Ambulatory Visit: Payer: Medicare Other | Admitting: Family Medicine

## 2019-02-05 ENCOUNTER — Encounter: Payer: Self-pay | Admitting: Family Medicine

## 2019-02-05 ENCOUNTER — Other Ambulatory Visit: Payer: Self-pay

## 2019-02-05 VITALS — BP 158/90 | HR 67 | Temp 99.1°F | Ht 65.0 in | Wt 150.8 lb

## 2019-02-05 DIAGNOSIS — F4321 Adjustment disorder with depressed mood: Secondary | ICD-10-CM | POA: Diagnosis not present

## 2019-02-05 DIAGNOSIS — I480 Paroxysmal atrial fibrillation: Secondary | ICD-10-CM

## 2019-02-05 DIAGNOSIS — E785 Hyperlipidemia, unspecified: Secondary | ICD-10-CM

## 2019-02-05 DIAGNOSIS — G4700 Insomnia, unspecified: Secondary | ICD-10-CM

## 2019-02-05 DIAGNOSIS — Z87448 Personal history of other diseases of urinary system: Secondary | ICD-10-CM

## 2019-02-05 DIAGNOSIS — R946 Abnormal results of thyroid function studies: Secondary | ICD-10-CM | POA: Diagnosis not present

## 2019-02-05 DIAGNOSIS — F419 Anxiety disorder, unspecified: Secondary | ICD-10-CM | POA: Diagnosis not present

## 2019-02-05 DIAGNOSIS — I10 Essential (primary) hypertension: Secondary | ICD-10-CM

## 2019-02-05 DIAGNOSIS — R7989 Other specified abnormal findings of blood chemistry: Secondary | ICD-10-CM

## 2019-02-05 DIAGNOSIS — Z0000 Encounter for general adult medical examination without abnormal findings: Secondary | ICD-10-CM

## 2019-02-05 DIAGNOSIS — I5031 Acute diastolic (congestive) heart failure: Secondary | ICD-10-CM

## 2019-02-05 MED ORDER — BELSOMRA 10 MG PO TABS: 1.0000 | ORAL_TABLET | Freq: Every evening | ORAL | 1 refills | Status: DC | PRN

## 2019-02-05 MED ORDER — ALPRAZOLAM 0.25 MG PO TABS: 0.2500 mg | ORAL_TABLET | Freq: Two times a day (BID) | ORAL | 1 refills | Status: DC | PRN

## 2019-02-05 NOTE — Progress Notes (Signed)
[name] is a 85 y.o. female who presents for annual wellness visit,CPE and follow-up on chronic medical conditions.  Her husband of 36 years died 30 month ago.  She has had difficulty with anxiety and sleep.  She has been using Xanax very sparingly and is also been using small doses of Ambien.  She has had intermittent difficulty with diarrhea but has not been able to identify any particular inciting incidents.  She is using her reflux medication every other day with good results.  She continues on Eliquis and is followed by Dr. [name] for underlying intermittent atrial fib..  She is taking metoprolol.  She is also taking pravastatin and having no aches or pains with that.  Recent blood work did show slightly elevated TSH.  She seems to be doing fairly well otherwise.  Her children are keeping in touch with her especially during these very trying times.  Immunizations and Health Maintenance Immunization History  Administered Date(s) Administered  . Influenza Split 12/18/2013  . Influenza-Unspecified 01/12/2015, 01/13/2016, 01/20/2017, 01/24/2018  . Pneumococcal Conjugate-13 10/02/2013  . Pneumococcal Polysaccharide-23 12/09/2014  . Tdap 04/19/2004, 02/20/2016  . Zoster 04/19/2004  . Zoster Recombinat (Shingrix) 10/29/2016, 03/21/2017   Health Maintenance Due  Topic Date Due  . MAMMOGRAM  03/26/2018  . INFLUENZA VACCINE  11/18/2018    Last Pap smear: aged out  Last mammogram: aged out  Last colonoscopy: aged out  Last DEXA:03-26-2016 Dentist: Q six month Ophtho:  Jan 2019 Exercise: walking  Other doctors caring for patient include: Dr. [name] cardio  Advanced directives:none on file     Depression screen:  See questionnaire below.  Depression screen [facility] 2/9 04/26/2017 01/15/2016 12/09/2014 10/02/2013  Decreased Interest 0 0 0 0  Down, Depressed, Hopeless 0 0 0 0  PHQ - 2 Score 0 0 0 0    Fall Risk Screen: see questionnaire below. Fall Risk  04/26/2017 01/15/2016 12/09/2014 10/02/2013   Falls in the past year? No No Yes No  Number falls in past yr: - - 1 -  Injury with Fall? - - Yes -  Follow up - - Falls evaluation completed;Education provided -    ADL screen:  See questionnaire below Functional Status Survey:     Review of Systems Constitutional: -, -unexpected weight change, -anorexia, -fatigue Allergy: -sneezing, -itching, -congestion Dermatology: denies changing moles, rash, lumps ENT: -runny nose, -ear pain, -sore throat,  Cardiology:  -chest pain, -palpitations, -orthopnea, Respiratory: -cough, -shortness of breath, -dyspnea on exertion, -wheezing,  Gastroenterology: -abdominal pain, -nausea, -vomiting, -diarrhea, -constipation, -dysphagia Hematology: -bleeding or bruising problems Musculoskeletal: -arthralgias, -myalgias, -joint swelling, -back pain, - Ophthalmology: -vision changes,  Urology: -dysuria, -difficulty urinating,  -urinary frequency, -urgency, incontinence Neurology: -, -numbness, , -memory loss, -falls, -dizziness    PHYSICAL EXAM:  There were no vitals taken for this visit.  General Appearance: Alert, cooperative, no distress, appears stated age Head: Normocephalic, without obvious abnormality, atraumatic Eyes: PERRL, conjunctiva/corneas clear, EOM's intact,  Ears: Normal TM's and external ear canals Nose: Nares normal, mucosa normal, no drainage or sinus tenderness Throat: Lips, mucosa, and tongue normal; teeth and gums normal Neck: Supple, no lymphadenopathy;  thyroid:  no enlargement/tenderness/nodules; no carotid bruit or JVD Lungs: Clear to auscultation bilaterally without wheezes, rales or ronchi; respirations unlabored Heart: Regular rate and rhythm, S1 and S2 normal, no murmur, rubor gallop Abdomen: Soft, non-tender, nondistended, normoactive bowel sounds,  no masses, no hepatosplenomegaly Extremities: No clubbing, cyanosis or edema Pulses: 2+ and symmetric all extremities Skin:  Skin color, texture, turgor normal, no  rashes or lesions Lymph nodes: Cervical, supraclavicular, and axillary nodes normal Neurologic:  CNII-XII intact, normal strength, sensation and gait; reflexes 2+ and symmetric throughout Psych: Normal mood, affect, hygiene and grooming.  ASSESSMENT/PLAN: Routine general medical examination at a health care facility  Anxiety - Plan: ALPRAZolam (XANAX) 0.25 MG tablet  Grief reaction  Insomnia, unspecified type - Plan: Suvorexant (BELSOMRA) 10 MG TABS  Hyperlipidemia with target LDL less than 130  H/O chronic cystitis  Essential hypertension, benign  Intermittent atrial fibrillation (HCC)  Acute diastolic CHF (congestive heart failure) (HCC)  Abnormal TSH - Plan: TSH Discussed bereavement counseling with her however she seems to be handling this as well as can be expected.  I will keep her on Xanax and switch to Belsomra to help with her sleep.  Discussed proper use of these medications.  Recheck here in several months. Medicare Attestation I have personally reviewed: The patient's medical and social history Their use of alcohol, tobacco or illicit drugs Their current medications and supplements The patient's functional ability including ADLs,fall risks, home safety risks, cognitive, and hearing and visual impairment Diet and physical activities Evidence for depression or mood disorders  The patient's weight, height, and BMI have been recorded in the chart.  I have made referrals, counseling, and provided education to the patient based on review of the above and I have provided the patient with a written personalized care plan for preventive services.     Jill Alexanders, MD   02/05/2019

## 2019-02-05 NOTE — Patient Instructions (Signed)
  [name] , Thank you for taking time to come for your Medicare Wellness Visit. I appreciate your ongoing commitment to your health goals. Please review the following plan we discussed and let me know if I can assist you in the future.   These are the goals we discussed: Continue on present medications.  Keep me informed as to how you are doing on the Xanax and the else,. This is a list of the screening recommended for you and due dates:  Health Maintenance  Topic Date Due  . Tetanus Vaccine  06/15/2026  . Flu Shot  Completed  . DEXA scan (bone density measurement)  Completed  . Pneumonia vaccines  Completed  . Mammogram  Discontinued

## 2019-02-06 LAB — TSH: TSH: 2.35 u[IU]/mL (ref 0.450–4.500)

## 2019-03-25 ENCOUNTER — Other Ambulatory Visit: Payer: Self-pay | Admitting: Family Medicine

## 2019-03-25 DIAGNOSIS — E785 Hyperlipidemia, unspecified: Secondary | ICD-10-CM

## 2019-03-27 ENCOUNTER — Other Ambulatory Visit: Payer: Self-pay | Admitting: Pharmacist

## 2019-03-27 MED ORDER — APIXABAN 5 MG PO TABS: 5.0000 mg | ORAL_TABLET | Freq: Two times a day (BID) | ORAL | 1 refills | Status: DC

## 2019-04-03 ENCOUNTER — Other Ambulatory Visit: Payer: Self-pay | Admitting: Family Medicine

## 2019-04-03 DIAGNOSIS — F419 Anxiety disorder, unspecified: Secondary | ICD-10-CM

## 2019-04-03 DIAGNOSIS — G4700 Insomnia, unspecified: Secondary | ICD-10-CM

## 2019-05-07 ENCOUNTER — Telehealth: Payer: Self-pay | Admitting: Family Medicine

## 2019-05-07 NOTE — Telephone Encounter (Signed)
I think that it is reasonable to treat her.  Fax it over her but yet we can called in if we need to.  What ever the protocol is.

## 2019-05-07 NOTE — Telephone Encounter (Signed)
Pt called and stated that she has been having frequent urination and mild cramping and is pretty sure its a UTI. She is at [facility] and unable to get out. She wanted to see if you could fax over a prescription to Adline Peals the nurse at [facility] her phone number is [phone]

## 2019-05-07 NOTE — Telephone Encounter (Signed)
Order was faxed over to [facility] attention to [facility] . [place]

## 2019-05-11 ENCOUNTER — Telehealth: Payer: Self-pay | Admitting: Family Medicine

## 2019-05-11 NOTE — Telephone Encounter (Signed)
Make sure she is taking a probiotic.  She can also use Imodium to help with the diarrhea if it is a problem.  If the rash is in the vaginal area have her use Lamisil

## 2019-05-11 NOTE — Telephone Encounter (Signed)
Pt called and states that the medication she was given is causing her diarrhea. She also has a slight rash. Please advise pt at [phone]. pt uses [facility].

## 2019-05-11 NOTE — Telephone Encounter (Signed)
Pt was advised [name] 

## 2019-05-15 NOTE — Progress Notes (Signed)
[name] Date of Birth: Apr 23, 1930 Medical Record [id number]  History of Present Illness: [name] is seen for followup for history of TIA and paroxysmal atrial fibrillation.  She is on anticoagulation with Eliquis.  She is on metoprolol and cardizem for rate control and prior event monitor showed good control when she is in Afib.  She  was admitted in March 2018 with nausea vomiting and the chest pain. Initial EKG showed normal sinus rhythm, CT of abdomen and pelvis showed large intrathoracic gastric hernia, no wall thickening all pneumatosis. Finding most likely representing spontaneous interval reduction of gastric volvulus. KUB was unremarkable. Ultrasound of abdomen does not show any gallstones. During the hospitalization, she developed worsening shortness of breath after aggressive IV hydration. She was treated with IV Lasix afterward with good urinary output. She was also found to be hypothyroid and received thyroid replacement. Her hospital stay was complicated by atrial fibrillation with RVR. She was  on eliquis. Troponin was elevated and a BNP was 836. Cardiology was consulted for atrial fibrillation and elevated troponin. Elevated troponin was felt to be related to demand ischemia with heart failure and the pneumonia. Echocardiogram obtained on 06/28/2016 showed EF [id number], grade 1 diastolic dysfunction. She required cardioversion on 06/29/2016 but was unable to maintain NSR.   On 08/21/16 she was readmitted and underwent Nissen fundoplication without complication.   On follow up today she is doing well. Her husband did pass away this year and it has been a tough adjustment for her. Moving into the main building at [employer] has a supportive family. Denies any chest pain or dyspnea. She denies any recurrent Afib.    Current Outpatient Medications on File Prior to Visit  Medication Sig Dispense Refill  . acetaminophen (TYLENOL) 500 MG tablet Take 500-1,000 mg by mouth every 6 (six) hours  as needed (for headache/pain.).    Marland Kitchen ALPRAZolam (XANAX) 0.25 MG tablet TAKE 1 TABLET(0.25 MG) BY MOUTH TWICE DAILY AS NEEDED FOR ANXIETY 30 tablet 0  . apixaban (ELIQUIS) 5 MG TABS tablet Take 1 tablet (5 mg total) by mouth 2 (two) times daily. 180 tablet 1  . BELSOMRA 10 MG TABS TAKE 1 TABLET BY MOUTH AT BEDTIME AS NEEDED. MAY REPEAT DOSE 1 TIME AS NEEDED 30 tablet 0  . budesonide (RHINOCORT AQUA) 32 MCG/ACT nasal spray Place 1 spray into both nostrils daily.    Marland Kitchen CALCIUM CARBONATE-VIT D-MIN PO Take 2 tablets by mouth 3 (three) times a week.     Marland Kitchen CRANBERRY PO Take 1 tablet by mouth daily.    Marland Kitchen DILT-XR 180 MG 24 hr capsule TAKE 1 CAPSULE BY MOUTH DAILY 90 capsule 3  . diltiazem (CARDIZEM CD) 180 MG 24 hr capsule Take 1 capsule (180 mg total) by mouth daily. (Patient taking differently: Take 180 mg by mouth at bedtime. ) 90 capsule 3  . Glucosamine-Chondroitin (COSAMIN DS PO) Take 1 tablet by mouth 2 (two) times daily.    Marland Kitchen loratadine (CLARITIN) 10 MG tablet Take 10 mg by mouth daily as needed for allergies.     . metoprolol tartrate (LOPRESSOR) 25 MG tablet TAKE 1 TABLET BY MOUTH TWICE DAILY 180 tablet 3  . ondansetron (ZOFRAN) 4 MG tablet Take 1 tablet (4 mg total) by mouth every 8 (eight) hours as needed for nausea or vomiting. 20 tablet 0  . pantoprazole (PROTONIX) 40 MG tablet TAKE 1 TABLET(40 MG) BY MOUTH DAILY 90 tablet 3  . Polyethyl Glycol-Propyl Glycol (SYSTANE OP) Place 1 drop  into both eyes 2 (two) times daily as needed (for dry eyes).     . pravastatin (PRAVACHOL) 40 MG tablet TAKE 1 TABLET(40 MG) BY MOUTH DAILY 90 tablet 3  . Probiotic Product (PROBIOTIC DAILY) CAPS Take 1 capsule by mouth daily.    . Psyllium (METAMUCIL FIBER PO) Take by mouth daily.    Marland Kitchen sulfamethoxazole-trimethoprim (BACTRIM,SEPTRA) 400-80 MG tablet Take 1 tablet by mouth 2 (two) times daily as needed.    . Vitamins-Lipotropics (LIPOFLAVONOID PO) Take 1 tablet by mouth 2 (two) times daily as needed (for ringing in  ears).     No current facility-administered medications on file prior to visit.    Allergies  Allergen Reactions  . Lisinopril Other (See Comments)    DIZZY    Past Medical History:  Diagnosis Date  . Arthritis    "hands, little in my feet" (06/22/2016)  . Candidiasis of vulva and vagina   . Cystocele, midline   . Disorder of bone and cartilage, unspecified   . Fibroids   . Hypertension   . Large hiatal hernia 06/23/2016  . Migraine    "none in the last few years" (06/22/2016)  . Other chronic cystitis   . Other sign and symptom in breast   . Paroxysmal atrial fibrillation ([place]) 11/30/2012  . Pneumonia    "I've had walking pneumonia a couple times"  (06/22/2016)  . Pneumonia dx'd 06/15/2016  . Rectal incontinence   . Stroke [facility]) 2014   hx of mini stroke   . TIA (transient ischemic attack) 2015    Past Surgical History:  Procedure Laterality Date  . CARDIOVERSION N/A 06/29/2016   Procedure: CARDIOVERSION;  Surgeon: Jerline Pain, MD;  Location: [facility] OR;  Service: Cardiovascular;  Laterality: N/A;  . CATARACT EXTRACTION W/ INTRAOCULAR LENS IMPLANT Left   . DILATION AND CURETTAGE OF UTERUS    . HYSTEROSCOPY WITH D & C  03/16/1999  . INSERTION OF MESH N/A 08/20/2016   Procedure: INSERTION OF MESH;  Surgeon: Ralene Ok, MD;  Location: [facility] ORS;  Service: General;  Laterality: N/A;  . TEE WITHOUT CARDIOVERSION N/A 11/06/2012   Procedure: TRANSESOPHAGEAL ECHOCARDIOGRAM (TEE);  Surgeon: Lelon Perla, MD;  Location: [facility] ENDOSCOPY;  Service: Cardiovascular;  Laterality: N/A;  . TONSILLECTOMY  1937  . VAGINAL HYSTERECTOMY  09/2005   Anterior repair; Removal of urethral caruncle./notes 09/02/2015    Social History   Tobacco Use  Smoking Status Never Smoker  Smokeless Tobacco Never Used    Social History   Substance and Sexual Activity  Alcohol Use Yes  . Alcohol/week: 1.0 standard drinks  . Types: 1 Glasses of wine per week   Comment: one glass of wine 1-2 days per week      Family History  Problem Relation Age of Onset  . Hypertension Mother   . Heart disease Father   . Heart disease Brother   . Emphysema Brother     Review of Systems: As noted in history of present illness. All other systems were reviewed and are negative.  Physical Exam: BP 132/68   Pulse (!) 58   Temp 97.6 F (36.4 C)   Ht 5\' 6"  (1.676 m)   Wt 143 lb 9.6 oz (65.1 kg)   BMI 23.18 kg/m  GENERAL:  Well appearing WF in NAD HEENT:  PERRL, EOMI, sclera are clear. Oropharynx is clear. NECK:  No jugular venous distention, carotid upstroke brisk and symmetric, no bruits, no thyromegaly or adenopathy LUNGS:  Clear to auscultation  bilaterally CHEST:  Unremarkable HEART:  RRR,  PMI not displaced or sustained,S1 and S2 within normal limits, no S3, no S4: no clicks, no rubs, no murmurs ABD:  Soft, nontender. BS +, no masses or bruits. No hepatomegaly, no splenomegaly EXT:  2 + pulses throughout, no edema, no cyanosis no clubbing SKIN:  Warm and dry.  No rashes NEURO:  Alert and oriented x 3. Cranial nerves II through XII intact. PSYCH:  Cognitively intact      LABORATORY DATA: Lab Results  Component Value Date   WBC 8.8 04/24/2018   HGB 14.8 04/24/2018   HCT 44.8 04/24/2018   PLT 342 04/24/2018   GLUCOSE 116 (H) 04/24/2018   CHOL 155 04/24/2018   TRIG 171 (H) 04/24/2018   HDL 50 04/24/2018   LDLCALC 71 04/24/2018   ALT 19 04/24/2018   AST 22 04/24/2018   NA 141 04/24/2018   K 4.4 04/24/2018   CL 102 04/24/2018   CREATININE 0.91 04/24/2018   BUN 12 04/24/2018   CO2 21 04/24/2018   TSH 2.350 02/05/2019   INR 1.31 06/22/2016   HGBA1C 6.1 (H) 04/24/2018    Ecg today shows NSR with possible septal infarct- old. LAD. No acute change. I have personally reviewed and interpreted this study.   Echo 07/04/16: Study Conclusions  - Left ventricle: The cavity size was normal. Wall thickness was   increased in a pattern of mild LVH. Systolic function was normal.   The  estimated ejection fraction was in the range of 50% to 55%.   Wall motion was normal; there were no regional wall motion   abnormalities. Doppler parameters are consistent with abnormal   left ventricular relaxation (grade 1 diastolic dysfunction). - Mitral valve: Moderately calcified annulus. There was moderate   regurgitation. - Left atrium: The atrium was mildly dilated. - Right atrium: The atrium was mildly dilated.  Assessment / Plan: 1. TIA. On long-term anticoagulation with Eliquis. Tolerating well.   2. Paroxysmal atrial fibrillation.  Continue rate control with metoprolol and cardizem. Maintaining NSR.  3. Hypertension- BP well controlled.  4. Hyperlipidemia-on pravastatin with good results.  5. S/p Nissen fundoplication.   I will follow up in one year.

## 2019-05-21 ENCOUNTER — Other Ambulatory Visit: Payer: Self-pay

## 2019-05-21 ENCOUNTER — Encounter: Payer: Self-pay | Admitting: Cardiology

## 2019-05-21 ENCOUNTER — Ambulatory Visit: Payer: Medicare Other | Admitting: Cardiology

## 2019-05-21 VITALS — BP 132/68 | HR 58 | Temp 97.6°F | Ht 66.0 in | Wt 143.6 lb

## 2019-05-21 DIAGNOSIS — E785 Hyperlipidemia, unspecified: Secondary | ICD-10-CM | POA: Diagnosis not present

## 2019-05-21 DIAGNOSIS — I10 Essential (primary) hypertension: Secondary | ICD-10-CM

## 2019-05-21 DIAGNOSIS — I480 Paroxysmal atrial fibrillation: Secondary | ICD-10-CM

## 2019-06-06 ENCOUNTER — Encounter: Payer: Self-pay | Admitting: Family Medicine

## 2019-06-19 ENCOUNTER — Other Ambulatory Visit: Payer: Self-pay | Admitting: Family Medicine

## 2019-06-19 DIAGNOSIS — G4700 Insomnia, unspecified: Secondary | ICD-10-CM

## 2019-06-19 DIAGNOSIS — F419 Anxiety disorder, unspecified: Secondary | ICD-10-CM

## 2019-06-19 NOTE — Telephone Encounter (Signed)
[facility] is requesting to fill pt xanax and belsomra. Please advise [facility]

## 2019-06-20 ENCOUNTER — Other Ambulatory Visit: Payer: Self-pay | Admitting: Cardiology

## 2019-09-19 ENCOUNTER — Other Ambulatory Visit: Payer: Self-pay

## 2019-09-19 MED ORDER — APIXABAN 5 MG PO TABS: 5.0000 mg | ORAL_TABLET | Freq: Two times a day (BID) | ORAL | 0 refills | Status: DC

## 2019-10-24 ENCOUNTER — Encounter: Payer: Self-pay | Admitting: Family Medicine

## 2019-10-24 ENCOUNTER — Telehealth: Payer: Self-pay | Admitting: Cardiology

## 2019-10-24 ENCOUNTER — Other Ambulatory Visit: Payer: Self-pay

## 2019-10-24 ENCOUNTER — Other Ambulatory Visit: Payer: Self-pay | Admitting: Family Medicine

## 2019-10-24 ENCOUNTER — Ambulatory Visit: Payer: Medicare Other | Admitting: Family Medicine

## 2019-10-24 ENCOUNTER — Ambulatory Visit
Admission: RE | Admit: 2019-10-24 | Discharge: 2019-10-24 | Disposition: A | Discharge status: Home / Self Care | Payer: Medicare Other | Source: Ambulatory Visit | Attending: Family Medicine | Admitting: Family Medicine

## 2019-10-24 VITALS — BP 112/70 | HR 75 | Temp 98.2°F

## 2019-10-24 DIAGNOSIS — I480 Paroxysmal atrial fibrillation: Secondary | ICD-10-CM

## 2019-10-24 DIAGNOSIS — M25551 Pain in right hip: Secondary | ICD-10-CM

## 2019-10-24 MED ORDER — TRAMADOL HCL 50 MG PO TABS: 50.0000 mg | ORAL_TABLET | Freq: Three times a day (TID) | ORAL | 0 refills | Status: AC | PRN

## 2019-10-24 NOTE — Telephone Encounter (Signed)
    Patient c/o Palpitations:  High priority if patient c/o lightheadedness, shortness of breath, or chest pain  1) How long have you had palpitations/irregular HR/ Afib? Are you having the symptoms now? 5 am this morning  2) Are you currently experiencing lightheadedness, SOB or CP? No, shakiness and feeling hot  3) Do you have a history of afib (atrial fibrillation) or irregular heart rhythm? Yes  4) Have you checked your BP or HR? (document readings if available): 145/80 HR 120  5) Are you experiencing any other symptoms? Pt's son said pt is having Afib episode this morning, right now she is laying on her bed and resting but they still need to know what they can do to help pt. He is hoping to get a call back in the next hour since he needs to go home today

## 2019-10-24 NOTE — Telephone Encounter (Signed)
[name], Anyway to get this patient in for an appointment?   Son is a doctor and is concerned about his mother.   Thank you!

## 2019-10-24 NOTE — Telephone Encounter (Signed)
I think the Afib clinic would be the best choice.  Tallon Gertz [name] MD, [facility]

## 2019-10-24 NOTE — Progress Notes (Signed)
Subjective:    Patient ID: [name], female    DOB: Mar 10, 1931, 86 y.o.   MRN: [id number]  HPI Chief Complaint  Patient presents with  . back pain    back pain x week. lower back- which has now started causing her afib to act up. has a-fib clinic appt tomorrow.    She is a patient of Dr. Lanice Shirts who is here today to see me for the first time for an acute problem.   Complains of right hip pain. States she was cleaning her bathroom and bending over for a while prior to her pain starting. Denies falling or injury.   She took Tylenol with minimal relief. She is wearing a [employer]are wrap. States she has difficulty walking on her right leg. Pain with laying down as well. Pain is relieved when sitting and resting.  She is with her son today and is sitting in a wheelchair. She lives alone in [place].  Denies numbness, tingling or weakness of her LEs.   Requests something stronger than Tylenol for pain. Has not done well with codeine in the past. She is    States she has A-fib and has been in touch with her cardiology office about this earlier today. Has an appt tomorrow with A-fib clinic. Rate seems controlled. States she thinks her pain is causing her A-fib to flare up.  She is on Eliquis.   Denies fever, chills, chest pain, shortness of breath, abdominal pain, N/V/D, urinary symptoms.   Reviewed allergies, medications, past medical, surgical, family, and social history.   Review of Systems Pertinent positives and negatives in the history of present illness.     Objective:   Physical Exam Constitutional:      General: She is not in acute distress.    Appearance: She is not ill-appearing.  Cardiovascular:     Rate and Rhythm: Normal rate. Rhythm irregular.  Pulmonary:     Effort: Pulmonary effort is normal.     Breath sounds: Normal breath sounds.  Musculoskeletal:     Cervical back: Normal range of motion and neck supple.     Lumbar back: No tenderness or bony  tenderness. Negative right straight leg raise test and negative left straight leg raise test.     Right hip: No deformity or bony tenderness. Decreased range of motion. Decreased strength.     Right lower leg: Normal.     Left lower leg: Normal.     Right foot: Normal. Normal capillary refill. Normal pulse.     Comments: Right lateral hip pain, no obvious abnormality. Unable to perform a full exam due to pain. Unable to bear weight due to pain. RLE is neurovascularly intact.   Skin:    General: Skin is warm and dry.     Capillary Refill: Capillary refill takes less than 2 seconds.     Findings: No bruising.  Neurological:     General: No focal deficit present.     Mental Status: She is alert.     Sensory: No sensory deficit.     Gait: Gait abnormal.    BP 112/70   Pulse 75   Temp 98.2 F (36.8 C)   SpO2 95%        Assessment & Plan:  Acute pain of right hip - Plan: traMADol (ULTRAM) 50 MG tablet, CANCELED: DG Hip Unilat W OR W/O Pelvis Min 4 Views Right  Intermittent atrial fibrillation (HCC)  She is sitting in a wheelchair, in no acute  distress.  Her son is with her today.  Here for acute right hip pain without injury and no obvious deformity. Unable to perform a complete exam due to pain.  She has been taking Tylenol without much relief.  I will prescribe tramadol and I did discuss possible sedating side effects.  Her son will be with her at least through tomorrow.  We will send her for an x-ray.  Discussed conservative treatment with topical analgesic, ice or heat, Tylenol or tramadol as needed.  She is currently in A. fib with a history of the same.  Anticoagulated on Eliquis.  She has been in touch with her cardiologist and has an appointment tomorrow at the [facility]. No acute cardiac symptoms. Follow-up pending stat x-ray.

## 2019-10-24 NOTE — Telephone Encounter (Signed)
I called Dr.Darsey- he states that his mother has a lot of pain and was having some GI upset, which they are trying to control, but states she was up at 5 AM this morning and states he thinks this is what put her in a fit, he states that she became jittery, and feeling hot, when he tried to count her pulse it was beating so fast that he got around 140 for HR, when nurse from facility came and listened and checked her it was around 120, but HR was irregular. He states they gave her morning meds, and even gave her xanax to have her try to calm down, as well as tylenol.   Son believes she should be checked out- he has an 11:30 appointment to be at, I did advise that Dr.Jordan was rounding in the hospital, but we had a DOD here, or possible AFIB clinic appointment may be a good idea.   Advised I would route to MD to get his opinion and call him back.  Dr.Lamere verbalized understanding.

## 2019-10-24 NOTE — Progress Notes (Signed)
I tried to call but did not leave a vm. Please try her again and let her know that I sent a my chart message. You can also share it with her.  I called to let you know that you X ray is negative for any type of fracture or dislocation. I am hopeful that your pain improves over the next few days. Take the pain medication with caution and let us know if you are not significantly improving in the next week or sooner if you have any new or worsening symptoms.

## 2019-10-24 NOTE — Telephone Encounter (Signed)
Spoke with Marzetta Board RN, she will have patient to be seen tomorrow with AFIB clinic at 3:00.  Dr.Cancelliere verbalized understanding.  They are at PCP now, will get EKG and bring it with them.

## 2019-10-25 ENCOUNTER — Ambulatory Visit (HOSPITAL_COMMUNITY)
Admission: RE | Admit: 2019-10-25 | Discharge: 2019-10-25 | Disposition: A | Discharge status: Home / Self Care | Payer: Medicare Other | Source: Ambulatory Visit | Attending: Nurse Practitioner | Admitting: Nurse Practitioner

## 2019-10-25 VITALS — BP 116/52 | HR 56 | Ht 66.0 in | Wt 142.8 lb

## 2019-10-25 DIAGNOSIS — Z79899 Other long term (current) drug therapy: Secondary | ICD-10-CM | POA: Diagnosis not present

## 2019-10-25 DIAGNOSIS — Z8673 Personal history of transient ischemic attack (TIA), and cerebral infarction without residual deficits: Secondary | ICD-10-CM | POA: Diagnosis not present

## 2019-10-25 DIAGNOSIS — D6869 Other thrombophilia: Secondary | ICD-10-CM | POA: Diagnosis not present

## 2019-10-25 DIAGNOSIS — Z7901 Long term (current) use of anticoagulants: Secondary | ICD-10-CM | POA: Insufficient documentation

## 2019-10-25 DIAGNOSIS — I480 Paroxysmal atrial fibrillation: Secondary | ICD-10-CM | POA: Diagnosis present

## 2019-10-25 DIAGNOSIS — I10 Essential (primary) hypertension: Secondary | ICD-10-CM | POA: Diagnosis not present

## 2019-10-25 LAB — CBC
HCT: 46 % (ref 36.0–46.0)
Hemoglobin: 14.6 g/dL (ref 12.0–15.0)
MCH: 29.7 pg (ref 26.0–34.0)
MCHC: 31.7 g/dL (ref 30.0–36.0)
MCV: 93.5 fL (ref 80.0–100.0)
Platelets: 301 10*3/uL (ref 150–400)
RBC: 4.92 MIL/uL (ref 3.87–5.11)
RDW: 14.1 % (ref 11.5–15.5)
WBC: 8 10*3/uL (ref 4.0–10.5)
nRBC: 0 % (ref 0.0–0.2)

## 2019-10-25 LAB — COMPREHENSIVE METABOLIC PANEL
ALT: 11 U/L (ref 0–44)
AST: 19 U/L (ref 15–41)
Albumin: 3.6 g/dL (ref 3.5–5.0)
Alkaline Phosphatase: 52 U/L (ref 38–126)
Anion gap: 9 (ref 5–15)
BUN: 23 mg/dL (ref 8–23)
CO2: 24 mmol/L (ref 22–32)
Calcium: 9.1 mg/dL (ref 8.9–10.3)
Chloride: 110 mmol/L (ref 98–111)
Creatinine, Ser: 0.88 mg/dL (ref 0.44–1.00)
GFR calc Af Amer: >60 mL/min (ref >60)
GFR calc non Af Amer: 59 mL/min — ABNORMAL LOW (ref >60)
Glucose, Bld: 159 mg/dL — ABNORMAL HIGH (ref 70–99)
Potassium: 3.9 mmol/L (ref 3.5–5.1)
Sodium: 143 mmol/L (ref 135–145)
Total Bilirubin: 0.7 mg/dL (ref 0.3–1.2)
Total Protein: 6.6 g/dL (ref 6.5–8.1)

## 2019-10-25 MED ORDER — DILTIAZEM HCL 30 MG PO TABS
ORAL_TABLET | ORAL | 1 refills | Status: DC
Start: 2019-10-25 — End: 2022-01-19

## 2019-10-25 NOTE — Progress Notes (Signed)
Primary Care Physician: [name], MD Referring Physician: NL  Cardiologist: Dr. [name]    [name] is a 86 y.o. female with a h/o paroxysmal  afib that has been very quiet over the years. She has had onset of rt hip pain that has been very uncomfortable for her and has had some afib intermittently since onset of pain. The pain is getting better since she saw her PCP  and was rx  some medications for it. She is her with her son who is a Magazine features editor in the [place] area.  She is on apixaban for a CHA2DS2VASc score of 6.   Today, she denies symptoms of palpitations, chest pain, shortness of breath, orthopnea, PND, lower extremity edema, dizziness, presyncope, syncope, or neurologic sequela. The patient is tolerating medications without difficulties and is otherwise without complaint today.   Past Medical History:  Diagnosis Date  . Arthritis    "hands, little in my feet" (06/22/2016)  . Candidiasis of vulva and vagina   . Cystocele, midline   . Disorder of bone and cartilage, unspecified   . Fibroids   . Hypertension   . Large hiatal hernia 06/23/2016  . Migraine    "none in the last few years" (06/22/2016)  . Other chronic cystitis   . Other sign and symptom in breast   . Paroxysmal atrial fibrillation ([place]) 11/30/2012  . Pneumonia    "I've had walking pneumonia a couple times"  (06/22/2016)  . Pneumonia dx'd 06/15/2016  . Rectal incontinence   . Stroke [facility]) 2014   hx of mini stroke   . TIA (transient ischemic attack) 2015   Past Surgical History:  Procedure Laterality Date  . CARDIOVERSION N/A 06/29/2016   Procedure: CARDIOVERSION;  Surgeon: Jerline Pain, MD;  Location: [facility] OR;  Service: Cardiovascular;  Laterality: N/A;  . CATARACT EXTRACTION W/ INTRAOCULAR LENS IMPLANT Left   . DILATION AND CURETTAGE OF UTERUS    . HYSTEROSCOPY WITH D & C  03/16/1999  . INSERTION OF MESH N/A 08/20/2016   Procedure: INSERTION OF MESH;  Surgeon: Ralene Ok, MD;  Location: [facility]  ORS;  Service: General;  Laterality: N/A;  . TEE WITHOUT CARDIOVERSION N/A 11/06/2012   Procedure: TRANSESOPHAGEAL ECHOCARDIOGRAM (TEE);  Surgeon: Lelon Perla, MD;  Location: [facility] ENDOSCOPY;  Service: Cardiovascular;  Laterality: N/A;  . TONSILLECTOMY  1937  . VAGINAL HYSTERECTOMY  09/2005   Anterior repair; Removal of urethral caruncle./notes 09/02/2015    Current Outpatient Medications  Medication Sig Dispense Refill  . acetaminophen (TYLENOL) 500 MG tablet Take 500-1,000 mg by mouth every 6 (six) hours as needed (for headache/pain.).    Marland Kitchen ALPRAZolam (XANAX) 0.25 MG tablet TAKE 1 TABLET(0.25 MG) BY MOUTH TWICE DAILY AS NEEDED FOR ANXIETY 30 tablet 0  . apixaban (ELIQUIS) 5 MG TABS tablet Take 1 tablet (5 mg total) by mouth 2 (two) times daily. LABS NEEDED FOR FURTHER REFILLS 180 tablet 0  . budesonide (RHINOCORT AQUA) 32 MCG/ACT nasal spray Place 1 spray into both nostrils as needed.     Marland Kitchen CALCIUM CARBONATE-VIT D-MIN PO Take 2 tablets by mouth 3 (three) times a week.     Marland Kitchen CRANBERRY PO Take 1 tablet by mouth daily.    Marland Kitchen diltiazem (CARDIZEM CD) 180 MG 24 hr capsule Take 1 capsule (180 mg total) by mouth daily. (Patient taking differently: Take 180 mg by mouth at bedtime. ) 90 capsule 3  . Glucosamine-Chondroitin (COSAMIN DS PO) Take 1 tablet by mouth 2 (  two) times daily.    Marland Kitchen loratadine (CLARITIN) 10 MG tablet Take 10 mg by mouth daily as needed for allergies.     . metoprolol tartrate (LOPRESSOR) 25 MG tablet TAKE 1 TABLET BY MOUTH TWICE DAILY 180 tablet 3  . ondansetron (ZOFRAN) 4 MG tablet Take 1 tablet (4 mg total) by mouth every 8 (eight) hours as needed for nausea or vomiting. 20 tablet 0  . Polyethyl Glycol-Propyl Glycol (SYSTANE OP) Place 1 drop into both eyes 2 (two) times daily as needed (for dry eyes).     . pravastatin (PRAVACHOL) 40 MG tablet TAKE 1 TABLET(40 MG) BY MOUTH DAILY 90 tablet 3  . Psyllium (METAMUCIL FIBER PO) Take by mouth as needed.     .  sulfamethoxazole-trimethoprim (BACTRIM,SEPTRA) 400-80 MG tablet Take 1 tablet by mouth 2 (two) times daily as needed.    . traMADol (ULTRAM) 50 MG tablet Take 1 tablet (50 mg total) by mouth every 8 (eight) hours as needed for up to 5 days. 15 tablet 0  . Vitamins-Lipotropics (LIPOFLAVONOID PO) Take 1 tablet by mouth 2 (two) times daily as needed (for ringing in ears).    . diltiazem (CARDIZEM) 30 MG tablet Take 1 tablet every 4 hours AS NEEDED for AFIB heart rate >100 45 tablet 1  . pantoprazole (PROTONIX) 40 MG tablet TAKE 1 TABLET(40 MG) BY MOUTH DAILY (Patient not taking: Reported on 10/24/2019) 90 tablet 3  . Probiotic Product (PROBIOTIC DAILY) CAPS Take 1 capsule by mouth daily. (Patient not taking: Reported on 10/24/2019)     No current facility-administered medications for this encounter.    Allergies  Allergen Reactions  . Lisinopril Other (See Comments)    DIZZY    Social History   Socioeconomic History  . Marital status: Widowed    Spouse name: Not on file  . Number of children: 2  . Years of education: Not on file  . Highest education level: Not on file  Occupational History  . Not on file  Tobacco Use  . Smoking status: Never Smoker  . Smokeless tobacco: Never Used  Vaping Use  . Vaping Use: Never used  Substance and Sexual Activity  . Alcohol use: Yes    Alcohol/week: 1.0 standard drink    Types: 1 Glasses of wine per week    Comment: one glass of wine 1-2 days per week   . Drug use: No  . Sexual activity: Not Currently  Other Topics Concern  . Not on file  Social History Narrative  . Not on file   Social Determinants of Health   Financial Resource Strain:   . Difficulty of Paying Living Expenses:   Food Insecurity:   . Worried About Charity fundraiser in the Last Year:   . Arboriculturist in the Last Year:   Transportation Needs:   . Film/video editor (Medical):   Marland Kitchen Lack of Transportation (Non-Medical):   Physical Activity:   . Days of Exercise  per Week:   . Minutes of Exercise per Session:   Stress:   . Feeling of Stress :   Social Connections:   . Frequency of Communication with Friends and Family:   . Frequency of Social Gatherings with Friends and Family:   . Attends Religious Services:   . Active Member of Clubs or Organizations:   . Attends Archivist Meetings:   Marland Kitchen Marital Status:   Intimate Partner Violence:   . Fear of Current or Ex-Partner:   .  Emotionally Abused:   Marland Kitchen Physically Abused:   . Sexually Abused:     Family History  Problem Relation Age of Onset  . Hypertension Mother   . Heart disease Father   . Heart disease Brother   . Emphysema Brother     ROS- All systems are reviewed and negative except as per the HPI above  Physical Exam: Vitals:   10/25/19 1454  BP: (!) 116/52  Pulse: (!) 56  Weight: 64.8 kg  Height: 5\' 6"  (1.676 m)   Wt Readings from Last 3 Encounters:  10/25/19 64.8 kg  05/21/19 65.1 kg  02/05/19 68.4 kg    Labs: Lab Results  Component Value Date   NA 141 04/24/2018   K 4.4 04/24/2018   CL 102 04/24/2018   CO2 21 04/24/2018   GLUCOSE 116 (H) 04/24/2018   BUN 12 04/24/2018   CREATININE 0.91 04/24/2018   CALCIUM 9.7 04/24/2018   MG 2.1 06/30/2016   Lab Results  Component Value Date   INR 1.31 06/22/2016   Lab Results  Component Value Date   CHOL 155 04/24/2018   HDL 50 04/24/2018   LDLCALC 71 04/24/2018   TRIG 171 (H) 04/24/2018     GEN- The patient is well appearing, alert and oriented x 3 today.   Head- normocephalic, atraumatic Eyes-  Sclera clear, conjunctiva pink Ears- hearing intact Oropharynx- clear Neck- supple, no JVP Lymph- no cervical lymphadenopathy Lungs- Clear to ausculation bilaterally, normal work of breathing Heart- Regular rate and rhythm, no murmurs, rubs or gallops, PMI not laterally displaced GI- soft, NT, ND, + BS Extremities- no clubbing, cyanosis, or edema MS- no significant deformity or atrophy Skin- no rash or  lesion Psych- euthymic mood, full affect Neuro- strength and sensation are intact  EKG- sinus brady at 56 bpm, pr 230 ms,qrs 78 qtc 449 ms    Assessment and Plan: 1. Paroxysmal afib  Has had low  burden until recent hip pain which is likely the trigger She has been self converting after several hours The hip  pain is getting better so hopefully the afib will quiet down  Will rx Cardizem 30 mg as needed for afib over 100 bpm and with a systolic of at least 275  Cbc/cmet today,  tsh normal when last checked   2. CHA2DS2VASc score of at least 6 Continue apixaban 5 mg bid   If afib burden does not decrease may consider amiodarone   [name], [facility] [address] [place], [place] [address] [phone]

## 2019-10-25 NOTE — Patient Instructions (Addendum)
Cardizem 30mg  -- take 1 tablet every 4 hours AS NEEDED for afib heart rate >100 as long as top number of blood pressure >100.   Let us know if the afib does not subside - [phone]

## 2019-11-05 ENCOUNTER — Telehealth (HOSPITAL_COMMUNITY): Payer: Self-pay

## 2019-11-05 NOTE — Telephone Encounter (Signed)
The facility called at 10:15am where the patient lives. They were concerned because she had already taken the Cardizem 30mg  and her heart rate was 144 and her blood pressure was 160/90. Patient was told to call back in 1 hour to update [place] on her readings. She called back and her heart rate decreased to 133. Roderic Palau NP advised her to take another dose of her Cardizem 30mg  and she was told to call back in 2 hours to update [place] on her readings. Once she called back her blood pressure had decreased to 110/52 and her heart rate was 60. Patient is aware to contact our clinic back in the am to let us know how she is feeling. Consulted with patients caregiver and the patient and they both verbalized understanding.

## 2019-11-29 ENCOUNTER — Encounter: Payer: Self-pay | Admitting: Family Medicine

## 2019-12-04 ENCOUNTER — Encounter: Payer: Self-pay | Admitting: Family Medicine

## 2019-12-04 ENCOUNTER — Other Ambulatory Visit: Payer: Self-pay

## 2019-12-04 ENCOUNTER — Ambulatory Visit: Payer: Medicare Other | Admitting: Family Medicine

## 2019-12-04 VITALS — BP 160/70 | HR 60 | Temp 96.7°F | Wt 142.4 lb

## 2019-12-04 DIAGNOSIS — M25551 Pain in right hip: Secondary | ICD-10-CM | POA: Diagnosis not present

## 2019-12-04 DIAGNOSIS — R7309 Other abnormal glucose: Secondary | ICD-10-CM | POA: Diagnosis not present

## 2019-12-04 DIAGNOSIS — E7439 Other disorders of intestinal carbohydrate absorption: Secondary | ICD-10-CM | POA: Diagnosis not present

## 2019-12-04 DIAGNOSIS — I480 Paroxysmal atrial fibrillation: Secondary | ICD-10-CM | POA: Diagnosis not present

## 2019-12-04 DIAGNOSIS — J309 Allergic rhinitis, unspecified: Secondary | ICD-10-CM

## 2019-12-04 DIAGNOSIS — F4321 Adjustment disorder with depressed mood: Secondary | ICD-10-CM

## 2019-12-04 LAB — POCT GLYCOSYLATED HEMOGLOBIN (HGB A1C): Hemoglobin A1C: 5.9 % — AB (ref 4.0–5.6)

## 2019-12-04 NOTE — Patient Instructions (Signed)
You can take 2 tylenol 4 times a day if needed

## 2019-12-04 NOTE — Progress Notes (Signed)
   Subjective:    Patient ID: [name], female    DOB: 15-Mar-1931, 86 y.o.   MRN: [id number]  HPI She is here for a med check appointment.  She does have underlying atrial fibrillation and presently is on Eliquis as well as Cardizem.  She does have occasional difficulty with her heart rate and was given a 30 mg Cardizem if she runs into trouble with a rapid heart.  She is in the process of getting a med alert as well.  She does live at [facility] and they do give her some support.  She is still adjusting to living alone and having to fend for herself.  She states that the pain that she is having in her hip tends to go from left to right and then back again.  She has used some Tylenol as well as tramadol but usually half dose of the tramadol.  Recent blood work did show an elevated blood sugar. She also has underlying allergies and does use Rhinocort on an as-needed basis.  Review of Systems     Objective:   Physical Exam  Alert and in no distress otherwise not examined.  Hemoglobin A1c is 5.9      Assessment & Plan:  Glucose intolerance  Acute pain of right hip  Intermittent atrial fibrillation (HCC)  Grief reaction  Allergic rhinitis, mild I explained that she does not have diabetes but prediabetes.  At this point I think watchful waiting is appropriate. Recommend she use Tylenol for pain relief and try to avoid using the tramadol . Discussed the use of Xanax and recommend she use it on an as-needed basis rather than regularly.  She has been reading material from hospice concerning the death of her husband and seems to be in a better spot. Also recommend she use her Rhinocort more regularly for her allergy symptoms. 28 minutes spent discussing all this issues with her

## 2019-12-04 NOTE — Addendum Note (Signed)
Addended by: Elyse Jarvis on: 12/04/2019 03:18 PM   Modules accepted: Orders

## 2019-12-17 ENCOUNTER — Other Ambulatory Visit: Payer: Self-pay | Admitting: Cardiology

## 2019-12-27 ENCOUNTER — Emergency Department (HOSPITAL_BASED_OUTPATIENT_CLINIC_OR_DEPARTMENT_OTHER): Payer: Medicare Other

## 2019-12-27 ENCOUNTER — Emergency Department (HOSPITAL_BASED_OUTPATIENT_CLINIC_OR_DEPARTMENT_OTHER)
Admission: EM | Admit: 2019-12-27 | Discharge: 2019-12-27 | Disposition: A | Discharge status: Home / Self Care | Payer: Medicare Other | Attending: Emergency Medicine | Admitting: Emergency Medicine

## 2019-12-27 ENCOUNTER — Encounter (HOSPITAL_BASED_OUTPATIENT_CLINIC_OR_DEPARTMENT_OTHER): Payer: Self-pay | Admitting: Emergency Medicine

## 2019-12-27 ENCOUNTER — Other Ambulatory Visit: Payer: Self-pay

## 2019-12-27 ENCOUNTER — Telehealth: Payer: Self-pay | Admitting: Family Medicine

## 2019-12-27 DIAGNOSIS — N3000 Acute cystitis without hematuria: Secondary | ICD-10-CM | POA: Diagnosis not present

## 2019-12-27 DIAGNOSIS — R1030 Lower abdominal pain, unspecified: Secondary | ICD-10-CM | POA: Diagnosis present

## 2019-12-27 DIAGNOSIS — R197 Diarrhea, unspecified: Secondary | ICD-10-CM | POA: Diagnosis not present

## 2019-12-27 DIAGNOSIS — I10 Essential (primary) hypertension: Secondary | ICD-10-CM | POA: Insufficient documentation

## 2019-12-27 DIAGNOSIS — Z20822 Contact with and (suspected) exposure to covid-19: Secondary | ICD-10-CM | POA: Insufficient documentation

## 2019-12-27 DIAGNOSIS — Z79899 Other long term (current) drug therapy: Secondary | ICD-10-CM | POA: Insufficient documentation

## 2019-12-27 DIAGNOSIS — R4781 Slurred speech: Secondary | ICD-10-CM | POA: Diagnosis not present

## 2019-12-27 LAB — URINALYSIS, ROUTINE W REFLEX MICROSCOPIC
Bilirubin Urine: NEGATIVE
Glucose, UA: NEGATIVE mg/dL
Ketones, ur: NEGATIVE mg/dL
Nitrite: POSITIVE — AB
Protein, ur: 30 mg/dL — AB
Specific Gravity, Urine: 1.025 (ref 1.005–1.030)
pH: 6 (ref 5.0–8.0)

## 2019-12-27 LAB — TROPONIN I (HIGH SENSITIVITY)
Troponin I (High Sensitivity): 19 ng/L — ABNORMAL HIGH (ref <18)
Troponin I (High Sensitivity): 17 ng/L (ref <18)

## 2019-12-27 LAB — COMPREHENSIVE METABOLIC PANEL
ALT: 24 U/L (ref 0–44)
AST: 58 U/L — ABNORMAL HIGH (ref 15–41)
Albumin: 3.6 g/dL (ref 3.5–5.0)
Alkaline Phosphatase: 47 U/L (ref 38–126)
Anion gap: 10 (ref 5–15)
BUN: 19 mg/dL (ref 8–23)
CO2: 26 mmol/L (ref 22–32)
Calcium: 8.9 mg/dL (ref 8.9–10.3)
Chloride: 103 mmol/L (ref 98–111)
Creatinine, Ser: 0.82 mg/dL (ref 0.44–1.00)
GFR calc Af Amer: >60 mL/min (ref >60)
GFR calc non Af Amer: >60 mL/min (ref >60)
Glucose, Bld: 112 mg/dL — ABNORMAL HIGH (ref 70–99)
Potassium: 3.6 mmol/L (ref 3.5–5.1)
Sodium: 139 mmol/L (ref 135–145)
Total Bilirubin: 0.8 mg/dL (ref 0.3–1.2)
Total Protein: 7 g/dL (ref 6.5–8.1)

## 2019-12-27 LAB — URINALYSIS, MICROSCOPIC (REFLEX): WBC, UA: >50 WBC/hpf (ref 0–5)

## 2019-12-27 LAB — CBC WITH DIFFERENTIAL/PLATELET
Abs Immature Granulocytes: 0.07 10*3/uL (ref 0.00–0.07)
Basophils Absolute: 0 10*3/uL (ref 0.0–0.1)
Basophils Relative: 0 %
Eosinophils Absolute: 0 10*3/uL (ref 0.0–0.5)
Eosinophils Relative: 0 %
HCT: 42.5 % (ref 36.0–46.0)
Hemoglobin: 13.6 g/dL (ref 12.0–15.0)
Immature Granulocytes: 1 %
Lymphocytes Relative: 26 %
Lymphs Abs: 3.5 10*3/uL (ref 0.7–4.0)
MCH: 29.1 pg (ref 26.0–34.0)
MCHC: 32 g/dL (ref 30.0–36.0)
MCV: 91 fL (ref 80.0–100.0)
Monocytes Absolute: 1 10*3/uL (ref 0.1–1.0)
Monocytes Relative: 7 %
Neutro Abs: 8.9 10*3/uL — ABNORMAL HIGH (ref 1.7–7.7)
Neutrophils Relative %: 66 %
Platelets: 276 10*3/uL (ref 150–400)
RBC: 4.67 MIL/uL (ref 3.87–5.11)
RDW: 14.6 % (ref 11.5–15.5)
WBC: 13.4 10*3/uL — ABNORMAL HIGH (ref 4.0–10.5)
nRBC: 0 % (ref 0.0–0.2)

## 2019-12-27 LAB — PROTIME-INR
INR: 1.5 — ABNORMAL HIGH (ref 0.8–1.2)
Prothrombin Time: 17.3 seconds — ABNORMAL HIGH (ref 11.4–15.2)

## 2019-12-27 LAB — LACTIC ACID, PLASMA
Lactic Acid, Venous: 1.4 mmol/L (ref 0.5–1.9)
Lactic Acid, Venous: 1.6 mmol/L (ref 0.5–1.9)

## 2019-12-27 LAB — APTT: aPTT: 37 seconds — ABNORMAL HIGH (ref 24–36)

## 2019-12-27 LAB — CBG MONITORING, ED: Glucose-Capillary: 107 mg/dL — ABNORMAL HIGH (ref 70–99)

## 2019-12-27 LAB — SARS CORONAVIRUS 2 BY RT PCR (HOSPITAL ORDER, PERFORMED IN ~~LOC~~ HOSPITAL LAB): SARS Coronavirus 2: NEGATIVE

## 2019-12-27 MED ORDER — CEPHALEXIN 500 MG PO CAPS: 500.0000 mg | ORAL_CAPSULE | Freq: Four times a day (QID) | ORAL | 0 refills | Status: DC

## 2019-12-27 MED ORDER — SODIUM CHLORIDE 0.9 % IV BOLUS
1000.0000 mL | Freq: Once | INTRAVENOUS | Status: AC
  Administered 2019-12-27: 1000 mL via INTRAVENOUS

## 2019-12-27 MED ORDER — SODIUM CHLORIDE 0.9 % IV SOLN
1.0000 g | Freq: Once | INTRAVENOUS | Status: AC
  Administered 2019-12-27: 1 g via INTRAVENOUS
  Filled 2019-12-27: qty 10

## 2019-12-27 NOTE — Telephone Encounter (Signed)
Heather from [facility] called and said pt was weak yesterday and fell twice. She was taken to the med center in [place] today. Pts family is not comfortable with letting her go back to her appt and wants her to do therapy instead. They will be faxing orders to you today to sign.

## 2019-12-27 NOTE — ED Notes (Signed)
2 unsuccessful IV attempts.

## 2019-12-27 NOTE — ED Provider Notes (Signed)
[facility] EMERGENCY DEPARTMENT Provider Note   CSN: [id number] Arrival date & time: 12/27/19  0957     History Chief Complaint  Patient presents with  . Weakness    [name] is a 86 y.o. female.  86 yo F with a chief complaints of generalized fatigue. The patient was found on the ground yesterday and was unable to get up on her own. Found to be diffusely weak and there is some concern for slurring of her speech. She had been complaining of abdominal discomfort for the past week. Seems to come and go. Usually feels like cramping and then she has to go and use the bathroom. Has had some diarrhea with this. Denies vomiting denies fever. Family feels that she is much better today than she was yesterday. They feel that she is still slurring her speech but has much improved. She was given some sort of medication therapy to break her from atrial fibrillation this morning by a family member who is an anesthesiologist. Reportedly was not in A. fib yesterday. Mild cough for the past week.  The patient is also had three falls in the past 2 days. No reported injury.  The history is provided by the patient and a relative.  Weakness Severity:  Moderate Onset quality:  Gradual Duration:  2 days Timing:  Constant Progression:  Partially resolved Chronicity:  New Relieved by:  Nothing Worsened by:  Nothing Ineffective treatments:  None tried Associated symptoms: abdominal pain (lower abdominal cramping prior to BM) and diarrhea   Associated symptoms: no arthralgias, no chest pain, no dizziness, no dysuria, no fever, no headaches, no myalgias, no nausea, no shortness of breath, no urgency and no vomiting        Past Medical History:  Diagnosis Date  . Arthritis    "hands, little in my feet" (06/22/2016)  . Candidiasis of vulva and vagina   . Cystocele, midline   . Disorder of bone and cartilage, unspecified   . Fibroids   . Hypertension   . Large hiatal hernia 06/23/2016  .  Migraine    "none in the last few years" (06/22/2016)  . Other chronic cystitis   . Other sign and symptom in breast   . Paroxysmal atrial fibrillation ([place]) 11/30/2012  . Pneumonia    "I've had walking pneumonia a couple times"  (06/22/2016)  . Pneumonia dx'd 06/15/2016  . Rectal incontinence   . Stroke [facility]) 2014   hx of mini stroke   . TIA (transient ischemic attack) 2015    Patient Active Problem List   Diagnosis Date Noted  . Glucose intolerance 12/04/2019  . S/P Nissen fundoplication (without gastrostomy tube) procedure 08/20/2016  . Abnormal TSH 06/26/2016  . Large hiatal hernia 06/23/2016  . Intermittent atrial fibrillation ([place]) 11/30/2012  . Thoracic aorta atherosclerosis ([place]) 10/25/2012  . Essential hypertension, benign 09/29/2012  . History of TIA (transient ischemic attack) 08/26/2012  . H/O chronic cystitis 08/26/2012  . Hyperlipidemia with target LDL less than 130 06/29/2011  . Osteopenia 06/29/2011  . Allergic rhinitis, mild 06/29/2011    Past Surgical History:  Procedure Laterality Date  . CARDIOVERSION N/A 06/29/2016   Procedure: CARDIOVERSION;  Surgeon: Jerline Pain, MD;  Location: [facility] OR;  Service: Cardiovascular;  Laterality: N/A;  . CATARACT EXTRACTION W/ INTRAOCULAR LENS IMPLANT Left   . DILATION AND CURETTAGE OF UTERUS    . HYSTEROSCOPY WITH D & C  03/16/1999  . INSERTION OF MESH N/A 08/20/2016   Procedure:  INSERTION OF MESH;  Surgeon: Ralene Ok, MD;  Location: [facility] ORS;  Service: General;  Laterality: N/A;  . TEE WITHOUT CARDIOVERSION N/A 11/06/2012   Procedure: TRANSESOPHAGEAL ECHOCARDIOGRAM (TEE);  Surgeon: Lelon Perla, MD;  Location: [facility] ENDOSCOPY;  Service: Cardiovascular;  Laterality: N/A;  . TONSILLECTOMY  1937  . VAGINAL HYSTERECTOMY  09/2005   Anterior repair; Removal of urethral caruncle./notes 09/02/2015     OB History   No obstetric history on file.     Family History  Problem Relation Age of Onset  . Hypertension Mother   .  Heart disease Father   . Heart disease Brother   . Emphysema Brother     Social History   Tobacco Use  . Smoking status: Never Smoker  . Smokeless tobacco: Never Used  Vaping Use  . Vaping Use: Never used  Substance Use Topics  . Alcohol use: Yes    Alcohol/week: 1.0 standard drink    Types: 1 Glasses of wine per week    Comment: one glass of wine 1-2 days per week   . Drug use: No    Home Medications Prior to Admission medications   Medication Sig Start Date End Date Taking? Authorizing Provider  acetaminophen (TYLENOL) 500 MG tablet Take 500-1,000 mg by mouth every 6 (six) hours as needed (for headache/pain.).    [provider]  ALPRAZolam (XANAX) 0.25 MG tablet TAKE 1 TABLET(0.25 MG) BY MOUTH TWICE DAILY AS NEEDED FOR ANXIETY 06/19/19   [name], MD  budesonide (RHINOCORT AQUA) 32 MCG/ACT nasal spray Place 1 spray into both nostrils as needed.     [provider]  CALCIUM CARBONATE-VIT D-MIN PO Take 2 tablets by mouth 3 (three) times a week.     [provider]  cephALEXin (KEFLEX) 500 MG capsule Take 1 capsule (500 mg total) by mouth 4 (four) times daily. 12/27/19   Deno Etienne, DO  CRANBERRY PO Take 1 tablet by mouth daily.    [provider]  diltiazem (CARDIZEM CD) 180 MG 24 hr capsule Take 1 capsule (180 mg total) by mouth daily. Patient taking differently: Take 180 mg by mouth at bedtime.  07/26/16   Almyra Deforest, PA  diltiazem (CARDIZEM) 30 MG tablet Take 1 tablet every 4 hours AS NEEDED for AFIB heart rate >100 10/25/19   Sherran Needs, NP  ELIQUIS 5 MG TABS tablet TAKE 1 TABLET BY MOUTH TWICE DAILY. LABS NEEDED FOR FURTHER REFILLS 12/17/19   [name], [name], MD  Glucosamine-Chondroitin (COSAMIN DS PO) Take 1 tablet by mouth 2 (two) times daily.    [provider]  loratadine (CLARITIN) 10 MG tablet Take 10 mg by mouth daily as needed for allergies.     [provider]  metoprolol tartrate (LOPRESSOR) 25 MG tablet TAKE  1 TABLET BY MOUTH TWICE DAILY 06/20/19   [name], [name], MD  ondansetron (ZOFRAN) 4 MG tablet Take 1 tablet (4 mg total) by mouth every 8 (eight) hours as needed for nausea or vomiting. Patient not taking: Reported on 12/04/2019 07/07/16   [name], MD  pantoprazole (PROTONIX) 40 MG tablet TAKE 1 TABLET(40 MG) BY MOUTH DAILY Patient not taking: Reported on 10/24/2019 12/28/18   [name], MD  Polyethyl Glycol-Propyl Glycol (SYSTANE OP) Place 1 drop into both eyes 2 (two) times daily as needed (for dry eyes).     [provider]  pravastatin (PRAVACHOL) 40 MG tablet TAKE 1 TABLET(40 MG) BY MOUTH DAILY 03/26/19  [name], MD  Probiotic Product (PROBIOTIC DAILY) CAPS Take 1 capsule by mouth daily. Patient not taking: Reported on 10/24/2019    [provider]  Psyllium (METAMUCIL FIBER PO) Take by mouth as needed.     [provider]  sulfamethoxazole-trimethoprim (BACTRIM,SEPTRA) 400-80 MG tablet Take 1 tablet by mouth 2 (two) times daily as needed. Patient not taking: Reported on 12/04/2019    [provider]  traMADol (ULTRAM) 50 MG tablet Take 50 mg by mouth every 6 (six) hours as needed.    [provider]  Vitamins-Lipotropics (LIPOFLAVONOID PO) Take 1 tablet by mouth 2 (two) times daily as needed (for ringing in ears).    [provider]    Allergies    Lisinopril  Review of Systems   Review of Systems  Constitutional: Negative for chills and fever.  HENT: Negative for congestion and rhinorrhea.   Eyes: Negative for redness and visual disturbance.  Respiratory: Negative for shortness of breath and wheezing.   Cardiovascular: Negative for chest pain and palpitations.  Gastrointestinal: Positive for abdominal pain (lower abdominal cramping prior to BM) and diarrhea. Negative for nausea and vomiting.  Genitourinary: Negative for dysuria and urgency.  Musculoskeletal: Negative for arthralgias and myalgias.  Skin: Negative  for pallor and wound.  Neurological: Positive for speech difficulty and weakness (generalized). Negative for dizziness and headaches.    Physical Exam Updated Vital Signs BP (!) 136/59 (BP Location: Right Arm)   Pulse 60   Temp 99 F (37.2 C) (Oral)   Resp 18   SpO2 100%   Physical Exam Vitals and nursing note reviewed.  Constitutional:      General: She is not in acute distress.    Appearance: She is well-developed. She is not diaphoretic.  HENT:     Head: Normocephalic and atraumatic.  Eyes:     Pupils: Pupils are equal, round, and reactive to light.  Cardiovascular:     Rate and Rhythm: Normal rate and regular rhythm.     Heart sounds: No murmur heard.  No friction rub. No gallop.   Pulmonary:     Effort: Pulmonary effort is normal.     Breath sounds: No wheezing or rales.  Abdominal:     General: There is no distension.     Palpations: Abdomen is soft.     Tenderness: There is no abdominal tenderness.  Musculoskeletal:        General: No tenderness.     Cervical back: Normal range of motion and neck supple.  Skin:    General: Skin is warm and dry.  Neurological:     Mental Status: She is alert and oriented to person, place, and time.     Cranial Nerves: Cranial nerves are intact.     Sensory: Sensation is intact.     Motor: Motor function is intact.     Coordination: Coordination is intact.     Gait: Gait is intact.     Comments: Benign neuro exam, patient able to ambulate independently.  Psychiatric:        Behavior: Behavior normal.     ED Results / Procedures / Treatments   Labs (all labs ordered are listed, but only abnormal results are displayed) Labs Reviewed  COMPREHENSIVE METABOLIC PANEL - Abnormal; Notable for the following components:      Result Value   Glucose, Bld 112 (*)    AST 58 (*)    All other components within normal limits  CBC WITH DIFFERENTIAL/PLATELET -  Abnormal; Notable for the following components:   WBC 13.4 (*)    Neutro Abs  8.9 (*)    All other components within normal limits  PROTIME-INR - Abnormal; Notable for the following components:   Prothrombin Time 17.3 (*)    INR 1.5 (*)    All other components within normal limits  APTT - Abnormal; Notable for the following components:   aPTT 37 (*)    All other components within normal limits  URINALYSIS, ROUTINE W REFLEX MICROSCOPIC - Abnormal; Notable for the following components:   APPearance CLOUDY (*)    Hgb urine dipstick MODERATE (*)    Protein, ur 30 (*)    Nitrite POSITIVE (*)    Leukocytes,Ua MODERATE (*)    All other components within normal limits  URINALYSIS, MICROSCOPIC (REFLEX) - Abnormal; Notable for the following components:   Bacteria, UA MANY (*)    All other components within normal limits  CBG MONITORING, ED - Abnormal; Notable for the following components:   Glucose-Capillary 107 (*)    All other components within normal limits  TROPONIN I (HIGH SENSITIVITY) - Abnormal; Notable for the following components:   Troponin I (High Sensitivity) 19 (*)    All other components within normal limits  SARS CORONAVIRUS 2 BY RT PCR (HOSPITAL ORDER, [facility])  CULTURE, BLOOD (SINGLE)  URINE CULTURE  CULTURE, BLOOD (SINGLE)  LACTIC ACID, PLASMA  LACTIC ACID, PLASMA  TROPONIN I (HIGH SENSITIVITY)    EKG None  Radiology CT Head Wo Contrast  Result Date: 12/27/2019 CLINICAL DATA:  [place] EXAM: CT HEAD WITHOUT CONTRAST TECHNIQUE: Contiguous axial images were obtained from the base of the skull through the vertex without intravenous contrast. COMPARISON:  08/26/2012 FINDINGS: Brain: No evidence of acute infarction, hemorrhage, hydrocephalus, extra-axial collection or mass lesion/mass effect. Chronic atrophic and ischemic changes are noted. Vascular: No hyperdense vessel or unexpected calcification. Skull: Normal. Negative for fracture or focal lesion. Sinuses/Orbits: No acute finding. Other: None. IMPRESSION: Chronic  atrophic and ischemic changes without acute abnormality. Electronically Signed   By: Inez Catalina M.D.   On: 12/27/2019 11:01   DG Chest Port 1 View  Result Date: 12/27/2019 CLINICAL DATA:  Weakness. Status post fall yesterday. Urinary incontinence. EXAM: PORTABLE CHEST 1 VIEW COMPARISON:  PA and lateral chest 08/16/2016. FINDINGS: Lungs are clear. Heart size is upper normal. Aortic atherosclerosis. No pneumothorax or pleural fluid. Hiatal hernia on the prior examination is not well seen today. No acute or focal bony abnormality. IMPRESSION: No acute disease. Aortic Atherosclerosis (ICD10-I70.0). Electronically Signed   By: Inge Rise M.D.   On: 12/27/2019 10:48    Procedures Procedures (including critical care time)  Medications Ordered in ED Medications  sodium chloride 0.9 % bolus 1,000 mL (1,000 mLs Intravenous New Bag/Given 12/27/19 1148)  cefTRIAXone (ROCEPHIN) 1 g in sodium chloride 0.9 % 100 mL IVPB (1 g Intravenous New Bag/Given 12/27/19 1418)    ED Course  I have reviewed the triage vital signs and the nursing notes.  Pertinent labs & imaging results that were available during my care of the patient were reviewed by me and considered in my medical decision making (see chart for details).    MDM Rules/Calculators/A&P                          86 yo F with a chief complaints of generalized weakness has been going on since yesterday. She was found on  the floor and they were unable to get her up. No known sick contacts. Has had diarrhea for the past week. She was also reportedly having slurred speech though none that I can appreciate on exam. Will obtain a laboratory evaluation CT of the head bolus of IV fluids and reassess.  CT of the head negative.  Chest x-ray viewed by me without focal infiltrate.  Patient's initial troponin was very mildly elevated at 19.  Repeat 17.  I feel likely benign without her having any chest pain or shortness of breath.  The patient has a nitrate  positive urinary tract infection.  Given a dose of Rocephin here.  Offered admission, family electing for outpatient follow-up and trial of home therapy.  3:09 PM:  I have discussed the diagnosis/risks/treatment options with the patient and family and believe the pt to be eligible for discharge home to follow-up with PCP. We also discussed returning to the ED immediately if new or worsening sx occur. We discussed the sx which are most concerning (e.g., sudden worsening pain, fever, inability to tolerate by mouth, unsafe at home) that necessitate immediate return. Medications administered to the patient during their visit and any new prescriptions provided to the patient are listed below.  Medications given during this visit Medications  sodium chloride 0.9 % bolus 1,000 mL (1,000 mLs Intravenous New Bag/Given 12/27/19 1148)  cefTRIAXone (ROCEPHIN) 1 g in sodium chloride 0.9 % 100 mL IVPB (1 g Intravenous New Bag/Given 12/27/19 1418)     The patient appears reasonably screen and/or stabilized for discharge and I doubt any other medical condition or other [facility] requiring further screening, evaluation, or treatment in the ED at this time prior to discharge.   Final Clinical Impression(s) / ED Diagnoses Final diagnoses:  Acute cystitis without hematuria    Rx / DC Orders ED Discharge Orders         Ordered    cephALEXin (KEFLEX) 500 MG capsule  4 times daily        12/27/19 [place], Stacyann Mcconaughy, DO 12/27/19 1510

## 2019-12-27 NOTE — ED Notes (Signed)
ED Provider at bedside. 

## 2019-12-27 NOTE — ED Triage Notes (Signed)
Pt states her family was concerned about her falling yesterday at home while going to the bathroom.  Pt states she couldn't get off the floor.  Family states stomach cramping x 1 week.  Eye issues, daughter states she had slurred speech yesterday, generalized weakness, incontinent x 2 urine, diarrhea this past week.

## 2019-12-27 NOTE — Discharge Instructions (Signed)
Return if you develop a fever or if you are unsafe at home.

## 2019-12-30 LAB — URINE CULTURE: Culture: >=100000 — AB

## 2019-12-31 ENCOUNTER — Telehealth: Payer: Self-pay | Admitting: *Deleted

## 2019-12-31 ENCOUNTER — Non-Acute Institutional Stay (SKILLED_NURSING_FACILITY): Payer: Medicare Other | Admitting: Adult Health

## 2019-12-31 ENCOUNTER — Encounter: Payer: Self-pay | Admitting: Adult Health

## 2019-12-31 DIAGNOSIS — D72829 Elevated white blood cell count, unspecified: Secondary | ICD-10-CM

## 2019-12-31 DIAGNOSIS — Z9889 Other specified postprocedural states: Secondary | ICD-10-CM

## 2019-12-31 DIAGNOSIS — K449 Diaphragmatic hernia without obstruction or gangrene: Secondary | ICD-10-CM

## 2019-12-31 DIAGNOSIS — R531 Weakness: Secondary | ICD-10-CM

## 2019-12-31 DIAGNOSIS — R109 Unspecified abdominal pain: Secondary | ICD-10-CM | POA: Diagnosis not present

## 2019-12-31 DIAGNOSIS — N3000 Acute cystitis without hematuria: Secondary | ICD-10-CM

## 2019-12-31 DIAGNOSIS — R410 Disorientation, unspecified: Secondary | ICD-10-CM

## 2019-12-31 DIAGNOSIS — I480 Paroxysmal atrial fibrillation: Secondary | ICD-10-CM

## 2019-12-31 DIAGNOSIS — I10 Essential (primary) hypertension: Secondary | ICD-10-CM

## 2019-12-31 NOTE — Telephone Encounter (Signed)
Post ED Visit - Positive Culture Follow-up  Culture report reviewed by antimicrobial stewardship pharmacist: [facility] []  Elenor Quinones, Pharm.D. []  Heide Guile, Pharm.D., BCPS AQ-ID []  Parks Neptune, Pharm.D., BCPS []  Alycia Rossetti, Pharm.D., BCPS []  [place]man, Pharm.D., BCPS, AAHIVP []  Legrand Como, Pharm.D., BCPS, AAHIVP []  Salome Arnt, PharmD, BCPS []  Johnnette Gourd, PharmD, BCPS []  Hughes Better, PharmD, BCPS []  Leeroy Cha, PharmD []  Laqueta Linden, PharmD, BCPS []  Albertina Parr, PharmD  [facility] Team []  Leodis Sias, PharmD []  Lindell Spar, PharmD []  Royetta Asal, PharmD []  Graylin Shiver, Rph []  Rema Fendt) Glennon Mac, PharmD []  Arlyn Dunning, PharmD []  Netta Cedars, PharmD []  Dia Sitter, PharmD []  Leone Haven, PharmD []  Gretta Arab, PharmD []  Theodis Shove, PharmD []  Peggyann Juba, PharmD []  Reuel Boom, PharmD   Positive urine culture Results and instructions faxed to [facility], Fax [phone], Pati Gallo, PA-C.  [facility] 12/31/2019, 2:05 PM

## 2019-12-31 NOTE — Progress Notes (Addendum)
Location:  Occupational psychologist of Service:  SNF (31) Provider:   Code Status: Full code  Goals of Care:  Advanced Directives 12/27/2019  Does Patient Have a Medical Advance Directive? Yes  Type of Advance Directive Living will;Healthcare Power of Attorney  Does patient want to make changes to medical advance directive? No - Patient declined  Copy of [place] in Chart? No - copy requested  Would patient like information on creating a medical advance directive? No - Patient declined     Chief Complaint  Patient presents with  . Acute Visit    f/u UTI    HPI: Patient is a 86 y.o. female seen today for follow up s/p visit to the ER. [name] resides [place] at [facility]. [name] came to the rehab skilled setting after a visit to the ER. [name] went to the ER with c/o generalized fatigue, fall, and some slurring of speech. [name] also had some abd discomfort and diarrhea. CT of the head showed no acute findings, chronic atrophic and ischemic changes were noted. CXR showed no acute findings. Covid swab neg, CBC wnl except WBC 13.4.  BMP WNL. UA C and S Hgb moderate amt, neg ketones, protein 30, nitrite pos, and leukocytes moderate. [name] was started on Keflex for a UTI and discharged. Since arriving in the rehab [name] has had some intermittent generalized abd discomfort and 4 loose stools on 9/11, 2 on 9/12, and no loose stools on 9/13.  [name] reports today that [name] still has some abd cramping and thinks the keflex is the cause. [name] has not had any n, v, or fever. [name] denies any urinary symptoms. [name] is alert and oriented for my visit but the nurse documented some confusion at night over the weekend. It is noted that [name] has had diarrhea and cramping before and seen her family doctor with negative work up (stool cx and O and P were neg)  The final urine culture returned and showed 100,000 colonies of E Coli ESBL resistant to cephalosporins. Blood cx are still pending. [name]  has a hx of chronic cystitis and is followed by Dr Alinda Money with urology.   Past Medical History:  Diagnosis Date  . Arthritis    "hands, little in my feet" (06/22/2016)  . Candidiasis of vulva and vagina   . Cystocele, midline   . Disorder of bone and cartilage, unspecified   . Fibroids   . Hypertension   . Large hiatal hernia 06/23/2016  . Migraine    "none in the last few years" (06/22/2016)  . Other chronic cystitis   . Other sign and symptom in breast   . Paroxysmal atrial fibrillation ([place]) 11/30/2012  . Pneumonia    "I've had walking pneumonia a couple times"  (06/22/2016)  . Pneumonia dx'd 06/15/2016  . Rectal incontinence   . Stroke [facility]) 2014   hx of mini stroke   . TIA (transient ischemic attack) 2015    Past Surgical History:  Procedure Laterality Date  . CARDIOVERSION N/A 06/29/2016   Procedure: CARDIOVERSION;  Surgeon: Jerline Pain, MD;  Location: [facility] OR;  Service: Cardiovascular;  Laterality: N/A;  . CATARACT EXTRACTION W/ INTRAOCULAR LENS IMPLANT Left   . DILATION AND CURETTAGE OF UTERUS    . HYSTEROSCOPY WITH D & C  03/16/1999  . INSERTION OF MESH N/A 08/20/2016   Procedure: INSERTION OF MESH;  Surgeon: Ralene Ok, MD;  Location: [facility] ORS;  Service: General;  Laterality:  N/A;  . TEE WITHOUT CARDIOVERSION N/A 11/06/2012   Procedure: TRANSESOPHAGEAL ECHOCARDIOGRAM (TEE);  Surgeon: Lelon Perla, MD;  Location: [facility] ENDOSCOPY;  Service: Cardiovascular;  Laterality: N/A;  . TONSILLECTOMY  1937  . VAGINAL HYSTERECTOMY  09/2005   Anterior repair; Removal of urethral caruncle./notes 09/02/2015    Allergies  Allergen Reactions  . Lisinopril Other (See Comments)    DIZZY    Outpatient Encounter Medications as of 12/31/2019  Medication Sig  . sulfamethoxazole-trimethoprim (BACTRIM DS) 800-160 MG tablet Take 1 tablet by mouth 2 (two) times daily.  Marland Kitchen acetaminophen (TYLENOL) 500 MG tablet Take 500-1,000 mg by mouth every 6 (six) hours as needed (for headache/pain.).  Marland Kitchen  ALPRAZolam (XANAX) 0.25 MG tablet TAKE 1 TABLET(0.25 MG) BY MOUTH TWICE DAILY AS NEEDED FOR ANXIETY  . budesonide (RHINOCORT AQUA) 32 MCG/ACT nasal spray Place 1 spray into both nostrils as needed.   Marland Kitchen CALCIUM CARBONATE-VIT D-MIN PO Take 2 tablets by mouth 3 (three) times a week.   Marland Kitchen CRANBERRY PO Take 1 tablet by mouth daily.  Marland Kitchen diltiazem (CARDIZEM CD) 180 MG 24 hr capsule Take 1 capsule (180 mg total) by mouth daily. (Patient taking differently: Take 180 mg by mouth at bedtime. )  . diltiazem (CARDIZEM) 30 MG tablet Take 1 tablet every 4 hours AS NEEDED for AFIB heart rate >100  . ELIQUIS 5 MG TABS tablet TAKE 1 TABLET BY MOUTH TWICE DAILY. LABS NEEDED FOR FURTHER REFILLS  . Glucosamine-Chondroitin (COSAMIN DS PO) Take 1 tablet by mouth 2 (two) times daily.  Marland Kitchen loratadine (CLARITIN) 10 MG tablet Take 10 mg by mouth daily as needed for allergies.   . metoprolol tartrate (LOPRESSOR) 25 MG tablet TAKE 1 TABLET BY MOUTH TWICE DAILY  . ondansetron (ZOFRAN) 4 MG tablet Take 1 tablet (4 mg total) by mouth every 8 (eight) hours as needed for nausea or vomiting. (Patient not taking: Reported on 12/04/2019)  . Polyethyl Glycol-Propyl Glycol (SYSTANE OP) Place 1 drop into both eyes 2 (two) times daily as needed (for dry eyes).   . pravastatin (PRAVACHOL) 40 MG tablet TAKE 1 TABLET(40 MG) BY MOUTH DAILY  . Probiotic Product (PROBIOTIC DAILY) CAPS Take 1 capsule by mouth daily. (Patient not taking: Reported on 10/24/2019)  . Psyllium (METAMUCIL FIBER PO) Take by mouth as needed.   . traMADol (ULTRAM) 50 MG tablet Take 50 mg by mouth every 6 (six) hours as needed.  . Vitamins-Lipotropics (LIPOFLAVONOID PO) Take 1 tablet by mouth 2 (two) times daily as needed (for ringing in ears).  . [DISCONTINUED] cephALEXin (KEFLEX) 500 MG capsule Take 1 capsule (500 mg total) by mouth 4 (four) times daily.  . [DISCONTINUED] pantoprazole (PROTONIX) 40 MG tablet TAKE 1 TABLET(40 MG) BY MOUTH DAILY (Patient not taking: Reported on  10/24/2019)  . [DISCONTINUED] sulfamethoxazole-trimethoprim (BACTRIM,SEPTRA) 400-80 MG tablet Take 1 tablet by mouth 2 (two) times daily as needed. (Patient not taking: Reported on 12/04/2019)   No facility-administered encounter medications on file as of 12/31/2019.    Review of Systems:  Review of Systems  Constitutional: Positive for fatigue. Negative for activity change, appetite change, chills, diaphoresis, fever and unexpected weight change.  HENT: Negative for congestion.   Respiratory: Negative for cough, shortness of breath and wheezing.   Cardiovascular: Positive for leg swelling. Negative for chest pain and palpitations.  Gastrointestinal: Negative for abdominal distention, abdominal pain (general discomfort ), constipation and diarrhea (intermittent none today).  Genitourinary: Negative for difficulty urinating and dysuria.  Musculoskeletal: Negative for arthralgias,  back pain, gait problem, joint swelling and myalgias.  Neurological: Negative for dizziness, tremors, seizures, syncope, facial asymmetry, speech difficulty, weakness (general), light-headedness, numbness and headaches.  Psychiatric/Behavioral: Positive for confusion. Negative for agitation and behavioral problems.    Health Maintenance  Topic Date Due  . INFLUENZA VACCINE  11/18/2019  . TETANUS/TDAP  06/15/2026  . DEXA SCAN  Completed  . COVID-19 Vaccine  Completed  . PNA vac Low Risk Adult  Completed  . MAMMOGRAM  Discontinued    Physical Exam: Vitals:   12/31/19 1201  BP: (!) 168/79  Pulse: 64  Resp: 18  Temp: 97.7 F (36.5 C)  SpO2: 93%   There is no height or weight on file to calculate BMI. Physical Exam Vitals and nursing note reviewed.  Constitutional:      General: [name] is not in acute distress.    Appearance: [name] is not diaphoretic.  HENT:     Head: Normocephalic and atraumatic.     Nose: Nose normal. No congestion.     Mouth/Throat:     Mouth: Mucous membranes are moist.     Pharynx:  Oropharynx is clear.  Eyes:     Conjunctiva/sclera: Conjunctivae normal.     Pupils: Pupils are equal, round, and reactive to light.  Neck:     Vascular: No JVD.  Cardiovascular:     Rate and Rhythm: Normal rate and regular rhythm.     Heart sounds: No murmur heard.   Pulmonary:     Effort: Pulmonary effort is normal. No respiratory distress.     Breath sounds: Rales (left base) present. No wheezing.  Abdominal:     General: There is no distension.     Palpations: Abdomen is soft.     Tenderness: There is no abdominal tenderness. There is no right CVA tenderness or left CVA tenderness.     Comments: BS hyperactive x 4  Musculoskeletal:     Comments: Trace ankle edema bilat  Skin:    General: Skin is warm and dry.  Neurological:     General: No focal deficit present.     Mental Status: [name] is alert and oriented to person, place, and time. Mental status is at baseline.  Psychiatric:        Mood and Affect: Mood normal.     Labs reviewed: Basic Metabolic Panel: Recent Labs    02/05/19 1554 10/25/19 1542 12/27/19 1135  NA  --  143 139  K  --  3.9 3.6  CL  --  110 103  CO2  --  24 26  GLUCOSE  --  159* 112*  BUN  --  23 19  CREATININE  --  0.88 0.82  CALCIUM  --  9.1 8.9  TSH 2.350  --   --    Liver Function Tests: Recent Labs    10/25/19 1542 12/27/19 1135  AST 19 58*  ALT 11 24  ALKPHOS 52 47  BILITOT 0.7 0.8  PROT 6.6 7.0  ALBUMIN 3.6 3.6   No results for input(s): LIPASE, AMYLASE in the last 8760 hours. No results for input(s): AMMONIA in the last 8760 hours. CBC: Recent Labs    10/25/19 1542 12/27/19 1135  WBC 8.0 13.4*  NEUTROABS  --  8.9*  HGB 14.6 13.6  HCT 46.0 42.5  MCV 93.5 91.0  PLT 301 276   Lipid Panel: No results for input(s): CHOL, HDL, LDLCALC, TRIG, CHOLHDL, LDLDIRECT in the last 8760 hours. Lab Results  Component Value Date  HGBA1C 5.9 (A) 12/04/2019    Procedures since last visit: CT Head Wo Contrast  Result Date:  12/27/2019 CLINICAL DATA:  Delirium EXAM: CT HEAD WITHOUT CONTRAST TECHNIQUE: Contiguous axial images were obtained from the base of the skull through the vertex without intravenous contrast. COMPARISON:  08/26/2012 FINDINGS: Brain: No evidence of acute infarction, hemorrhage, hydrocephalus, extra-axial collection or mass lesion/mass effect. Chronic atrophic and ischemic changes are noted. Vascular: No hyperdense vessel or unexpected calcification. Skull: Normal. Negative for fracture or focal lesion. Sinuses/Orbits: No acute finding. Other: None. IMPRESSION: Chronic atrophic and ischemic changes without acute abnormality. Electronically Signed   By: Inez Catalina M.D.   On: 12/27/2019 11:01   DG Chest Port 1 View  Result Date: 12/27/2019 CLINICAL DATA:  Weakness. Status post fall yesterday. Urinary incontinence. EXAM: PORTABLE CHEST 1 VIEW COMPARISON:  PA and lateral chest 08/16/2016. FINDINGS: Lungs are clear. Heart size is upper normal. Aortic atherosclerosis. No pneumothorax or pleural fluid. Hiatal hernia on the prior examination is not well seen today. No acute or focal bony abnormality. IMPRESSION: No acute disease. Aortic Atherosclerosis (ICD10-I70.0). Electronically Signed   By: Inge Rise M.D.   On: 12/27/2019 10:48    Assessment/Plan 1. Acute cystitis without hematuria Due to culture data will change to Bactrim DS bid x 7 days.   2. Weakness Due to acute illness, PT and OT ordered.   3. Abdominal discomfort With associated intermittent loose stools present before starting Keflex. [name] has not had any loose stools today if this continues will order stool studies. Would consider IBS if symptoms do not resolve   4. Confusion Not present for my visit today but was noted by the nurse in the evening. MMSE 27/30.   ? If this is due to the UTI and change in environment (?underlying MCI)  5. Essential hypertension, benign Bp slightly elevated, will monitor while [name] is in rehab  6.  Intermittent atrial fibrillation (HCC) Rate is controlled Continues on Eliquis for CVA risk reduction   7. Large hiatal hernia S/p repair.  No signs of obstruction at this time  8. S/P Nissen fundoplication (without gastrostomy tube) procedure Pt reports that [name] is not taking protonix any longer   9. Leukocytosis, unspecified type Repeat cbc 9/16

## 2020-01-01 LAB — CULTURE, BLOOD (SINGLE)
Culture: NO GROWTH
Culture: NO GROWTH
Special Requests: ADEQUATE

## 2020-01-02 ENCOUNTER — Telehealth: Payer: Self-pay | Admitting: Nurse Practitioner

## 2020-01-02 NOTE — Telephone Encounter (Signed)
While on call 01/01/20 [facility] nurse called regarding new rash to upper chest that [name] had developed. Reported itching. Of note, she had recently been prescribed bactrim for UTI. Will dc bactrim and added to allergy list due to rash. Due to limited sensitivities will place on nitrofurantion 100 mg BID PO for 7 days., sent to [facility], NP who has been following at [facility] for [facility]

## 2020-03-06 DIAGNOSIS — T50B95A Adverse effect of other viral vaccines, initial encounter: Secondary | ICD-10-CM | POA: Insufficient documentation

## 2020-03-16 ENCOUNTER — Other Ambulatory Visit: Payer: Self-pay | Admitting: Cardiology

## 2020-03-16 ENCOUNTER — Other Ambulatory Visit: Payer: Self-pay | Admitting: Family Medicine

## 2020-03-16 DIAGNOSIS — E785 Hyperlipidemia, unspecified: Secondary | ICD-10-CM

## 2020-03-17 NOTE — Telephone Encounter (Signed)
19f, (64.8 kg based on last visit with donna carrol 10/25/19 ), 0.76 (03/05/20 Pt requests 2.5mg  eliquis but qualifies for 5 mg will route to pharmd pool

## 2020-03-17 NOTE — Telephone Encounter (Signed)
Typo pt does qualify for the 5mg  eliquis

## 2020-04-22 ENCOUNTER — Encounter: Payer: Self-pay | Admitting: Family Medicine

## 2020-04-22 ENCOUNTER — Other Ambulatory Visit: Payer: Self-pay

## 2020-04-22 ENCOUNTER — Ambulatory Visit (INDEPENDENT_AMBULATORY_CARE_PROVIDER_SITE_OTHER): Payer: Medicare Other | Admitting: Family Medicine

## 2020-04-22 VITALS — BP 170/74 | HR 70 | Temp 97.0°F | Wt 145.0 lb

## 2020-04-22 DIAGNOSIS — R350 Frequency of micturition: Secondary | ICD-10-CM

## 2020-04-22 DIAGNOSIS — N3001 Acute cystitis with hematuria: Secondary | ICD-10-CM | POA: Diagnosis not present

## 2020-04-22 LAB — POCT URINALYSIS DIP (PROADVANTAGE DEVICE)
Bilirubin, UA: NEGATIVE
Glucose, UA: NEGATIVE mg/dL
Ketones, POC UA: NEGATIVE mg/dL
Nitrite, UA: POSITIVE — AB
Protein Ur, POC: 30 mg/dL — AB
Specific Gravity, Urine: 1.03
Urobilinogen, Ur: 0.2
pH, UA: 5.5 (ref 5.0–8.0)

## 2020-04-22 MED ORDER — AMOXICILLIN-POT CLAVULANATE 875-125 MG PO TABS: 1.0000 | ORAL_TABLET | Freq: Two times a day (BID) | ORAL | 0 refills | Status: DC

## 2020-04-22 NOTE — Patient Instructions (Signed)
take the antibiotic.  You can use a probiotic and yogurt is fine to use.  Bring a specimen back here in 2 weeks so we can check it

## 2020-04-22 NOTE — Progress Notes (Signed)
   Subjective:    Patient ID: [name], female    DOB: February 10, 1931, 87 y.o.   MRN: [id number]  HPI She is here for consult concerning continued difficulty with UTI.  A review of her record indicates that she did have a UTI in September and was treated with Septra but then developed a rash to that.  In November she was again evaluated and treated initially with Cipro and then switched to Macrobid.  The culture did show sensitivity to Macrobid.  She was given 5 days of that.  She is again having difficulty now with frequency and dysuria.   Review of Systems     Objective:   Physical Exam Alert and in no distress.  Urine microscopic showed crenated red cells, white cells and bacteria TNTC. Culture was reviewed from the last visit.       Assessment & Plan:  Acute cystitis with hematuria - Plan: amoxicillin-clavulanate (AUGMENTIN) 875-125 MG tablet  Frequency of urination - Plan: POCT Urinalysis DIP (Proadvantage Device) I am not comfortable putting her on Macrodantin due to her age.  I will switch her to Augmentin.  Explained that that might cause difficulty with diarrhea and is to use probiotics and Dannon yogurt.  She is to return in 2 weeks with a urine specimen to check again. Suspect that she has just had inadequate treatment of her cystitis and if she does continue to have difficulty, urology referral will be made.

## 2020-04-23 ENCOUNTER — Telehealth: Payer: Self-pay | Admitting: Family Medicine

## 2020-04-23 NOTE — Telephone Encounter (Signed)
Have her use Imodium and continue on the medicine to see if she can tolerate it.  If she cannot have her call back and I will reevaluate another medication that we can possibly use.

## 2020-04-23 NOTE — Telephone Encounter (Signed)
Pt. Called back stating she has not heard anything back yet and wanted to know if someone could call her back today and let her know if she needs to stop the medications doesn't want to have diarrhea for two weeks while on this medications.

## 2020-04-23 NOTE — Telephone Encounter (Signed)
Pt called and said she took one of the antibiotics last night and has had diarrhea. She wants to see if she needs to continue the medication?

## 2020-04-23 NOTE — Telephone Encounter (Signed)
Pt. Aware of recommendations.  

## 2020-04-25 ENCOUNTER — Telehealth: Payer: Self-pay | Admitting: Family Medicine

## 2020-04-25 NOTE — Telephone Encounter (Signed)
It is not a diarrhea medicine

## 2020-04-25 NOTE — Telephone Encounter (Signed)
Pt wanted to now if she could take the zofran instead of starting over with a new med due to allergies to so many abx. [place]

## 2020-04-25 NOTE — Telephone Encounter (Signed)
Let her know this medicine medicine for nausea and vomiting.  If she wants we can switch to a different medicine to help stop her diarrhea.  We can stop the Augmentin and switch her to Macrobid 1 twice per day.  Give her a 1 week supply of that

## 2020-04-25 NOTE — Telephone Encounter (Signed)
[place] to you

## 2020-04-25 NOTE — Telephone Encounter (Signed)
Pt called and states that she is still having the diarrhea she did take the imodioum, but she states the nurse there suggest she take zofran to help the diarrhea,   She states she has some there but wants to know make sure it is ok to take  Pt can be reached at [phone]

## 2020-04-25 NOTE — Telephone Encounter (Signed)
Pt called back about Augmentin issues. Told her Dr. Lanice Shirts response,  She states that a nurse at assisted living facility where she stays told her to try Zofran with the Augmentin for the nausea. She states she does not have anyone to pick up new rx for her so she is going to try The zofran and will call back if she has any more issues

## 2020-04-28 NOTE — Telephone Encounter (Signed)
Pt was advised [facility] 

## 2020-05-07 ENCOUNTER — Other Ambulatory Visit: Payer: Self-pay

## 2020-05-07 ENCOUNTER — Other Ambulatory Visit (INDEPENDENT_AMBULATORY_CARE_PROVIDER_SITE_OTHER): Payer: Medicare Other

## 2020-05-07 ENCOUNTER — Telehealth: Payer: Self-pay

## 2020-05-07 DIAGNOSIS — R350 Frequency of micturition: Secondary | ICD-10-CM | POA: Diagnosis not present

## 2020-05-07 DIAGNOSIS — N3001 Acute cystitis with hematuria: Secondary | ICD-10-CM | POA: Diagnosis not present

## 2020-05-07 LAB — POCT URINALYSIS DIP (PROADVANTAGE DEVICE)
Bilirubin, UA: NEGATIVE
Glucose, UA: NEGATIVE mg/dL
Ketones, POC UA: NEGATIVE mg/dL
Nitrite, UA: NEGATIVE
Protein Ur, POC: 100 mg/dL — AB
Specific Gravity, Urine: 1.025
Urobilinogen, Ur: 0.2
pH, UA: 6 (ref 5.0–8.0)

## 2020-05-07 MED ORDER — CEFUROXIME AXETIL 250 MG PO TABS: 250.0000 mg | ORAL_TABLET | Freq: Two times a day (BID) | ORAL | 0 refills | Status: DC

## 2020-05-07 NOTE — Progress Notes (Addendum)
Her urinalysis is positive.  I will treat her with Ceftin and get a culture.  If she continues have difficulty, urology referral will be made.  05/13/19 The culture results came back and she is sensitive to doxycycline and I will call that one in.

## 2020-05-07 NOTE — Telephone Encounter (Signed)
Pt had returned for a repeat U/A. Per Dr. Redmond School we need a culture . Please advise if here is anything else needs to told to the pt and if we are sending in another abx. Pt daughter is also requesting a referral to urology.  [place]

## 2020-05-07 NOTE — Addendum Note (Signed)
Addended by: [name] on: 05/07/2020 03:39 PM   Modules accepted: Orders

## 2020-05-12 ENCOUNTER — Telehealth: Payer: Self-pay | Admitting: Family Medicine

## 2020-05-12 LAB — URINE CULTURE

## 2020-05-12 MED ORDER — DOXYCYCLINE HYCLATE 100 MG PO TABS: 100.0000 mg | ORAL_TABLET | Freq: Two times a day (BID) | ORAL | 0 refills | Status: DC

## 2020-05-12 NOTE — Telephone Encounter (Signed)
Pt called and states that she did not received results of urine culture and just wants to make sure she is on the correct medication. She states she continues to have symptoms.  Pt uses IT sales professional. Pt can be reached at [phone].

## 2020-05-12 NOTE — Addendum Note (Signed)
Addended by: [name] on: 05/12/2020 09:43 AM   Modules accepted: Orders

## 2020-05-14 ENCOUNTER — Telehealth: Payer: Self-pay

## 2020-05-14 NOTE — Telephone Encounter (Signed)
Pt son advised of form from [facility] living will be sent over . He would like them filled out as soon as we can so pt will be able to stay admitted. Advise son we will get this done and if we have any problem we will let hi know. [place]

## 2020-05-21 ENCOUNTER — Telehealth: Payer: Self-pay

## 2020-05-21 NOTE — Telephone Encounter (Signed)
Tabitha from [facility] called to advise a writen order will be needed for repeat U/A and Culture. Pt will be finish with her abx 05/26/20 and before she can return to [facility] status this needs to be done . It needs to be faxed to [phone]. Please advised [facility]

## 2020-05-22 NOTE — Telephone Encounter (Signed)
Take care of this 

## 2020-05-22 NOTE — Telephone Encounter (Signed)
Called in verbal and will fax over order when machine get fixed. [place]

## 2020-05-26 NOTE — Progress Notes (Signed)
[name] Date of Birth: May 21, 1930 Medical Record #[id number]  History of Present Illness: [name] is seen for followup for history of TIA and paroxysmal atrial fibrillation.  She is on anticoagulation with Eliquis.  She is on metoprolol and cardizem for rate control and prior event monitor showed good control when she is in Afib.  She  was admitted in March 2018 with nausea vomiting and the chest Spencer. Initial EKG showed normal sinus rhythm, CT of abdomen and pelvis showed large intrathoracic gastric hernia, no wall thickening all pneumatosis. Finding most likely representing spontaneous interval reduction of gastric volvulus. KUB was unremarkable. Ultrasound of abdomen does not show any gallstones. During the hospitalization, she developed worsening shortness of breath after aggressive IV hydration. She was treated with IV Lasix afterward with good urinary output. She was also found to be hypothyroid and received thyroid replacement. Her hospital stay was complicated by atrial fibrillation with RVR. She was  on eliquis. Troponin was elevated and a BNP was 836. Cardiology was consulted for atrial fibrillation and elevated troponin. Elevated troponin was felt to be related to demand ischemia with heart failure and the pneumonia. Echocardiogram obtained on 06/28/2016 showed EF 10-27%, grade 1 diastolic dysfunction. She required cardioversion on 06/29/2016 but was unable to maintain NSR.   On 08/21/16 she was readmitted and underwent Nissen fundoplication without complication.   Last summer she was seen in the Afib clinic with Afib. She was treated with long acting Cardizem as well as 30 mg po prn. She states this has helped. She is seen today with her son who is an anesthesiologist in [name]. She still has Afib. Not really sustained. Has a Cadionet monitor but doesn't like to use it. Has had recurrent Ecoli UTIs. Planning to see urology. She is at independent living at [facility]    Her husband  did pass away this year and it has been a tough adjustment for her.   Current Outpatient Medications on File Prior to Visit  Medication Sig Dispense Refill   acetaminophen (TYLENOL) 500 MG tablet Take 500-1,000 mg by mouth every 6 (six) hours as needed (for headache/S[place].).     ALPRAZolam (XANAX) 0.25 MG tablet TAKE 1 TABLET(0.25 MG) BY MOUTH TWICE DAILY AS NEEDED FOR ANXIETY 30 tablet 0   budesonide (RHINOCORT AQUA) 32 MCG/ACT nasal spray Place 1 spray into both nostrils as needed.      CALCIUM CARBONATE-VIT D-MIN PO Take 2 tablets by mouth 3 (three) times a week.      CRANBERRY PO Take 1 tablet by mouth daily.     diltiazem (CARDIZEM CD) 180 MG 24 hr capsule Take 1 capsule (180 mg total) by mouth daily. 90 capsule 3   diltiazem (CARDIZEM) 30 MG tablet Take 1 tablet every 4 hours AS NEEDED for AFIB heart rate >100 45 tablet 1   ELIQUIS 5 MG TABS tablet TAKE 1 TABLET BY MOUTH TWICE DAILY. LABS NEEDED FOR FURTHER REFILLS 180 tablet 1   Glucosamine-Chondroitin (COSAMIN DS PO) Take 1 tablet by mouth 2 (two) times daily.     loratadine (CLARITIN) 10 MG tablet Take 10 mg by mouth daily as needed for allergies.      metoprolol tartrate (LOPRESSOR) 25 MG tablet TAKE 1 TABLET BY MOUTH TWICE DAILY 180 tablet 3   ondansetron (ZOFRAN) 4 MG tablet Take 1 tablet (4 mg total) by mouth every 8 (eight) hours as needed for nausea or vomiting. 20 tablet 0   Polyethyl Glycol-Propyl Glycol (SYSTANE OP) Place  1 drop into both eyes 2 (two) times daily as needed (for dry eyes).      pravastatin (PRAVACHOL) 40 MG tablet TAKE 1 TABLET(40 MG) BY MOUTH DAILY 90 tablet 3   Probiotic Product (PROBIOTIC DAILY) CAPS Take 1 capsule by mouth daily.     Psyllium (METAMUCIL FIBER PO) Take by mouth as needed.      traMADol (ULTRAM) 50 MG tablet Take 50 mg by mouth every 6 (six) hours as needed.     Vitamins-Lipotropics (LIPOFLAVONOID PO) Take 1 tablet by mouth 2 (two) times daily as needed (for ringing in ears).      No current facility-administered medications on file prior to visit.    Allergies  Allergen Reactions   Lactose Other (See Comments)    Dairy Sensitivity  Dairy Sensitivity     Lisinopril Other (See Comments)    DIZZY   Bactrim [Sulfamethoxazole-Trimethoprim] Rash    Past Medical History:  Diagnosis Date   Arthritis    "hands, little in my feet" (06/22/2016)   Candidiasis of vulva and vagina    Cystocele, midline    Disorder of bone and cartilage, unspecified    Fibroids    Hypertension    Large hiatal hernia 06/23/2016   Migraine    "none in the last few years" (06/22/2016)   Other chronic cystitis    Other sign and symptom in breast    Paroxysmal atrial fibrillation ([place]) 11/30/2012   Pneumonia    "I've had walking pneumonia a couple times"  (06/22/2016)   Pneumonia dx'd 06/15/2016   Rectal incontinence    Stroke ([place]) 2014   hx of mini stroke    TIA (transient ischemic attack) 2015    Past Surgical History:  Procedure Laterality Date   CARDIOVERSION N/A 06/29/2016   Procedure: CARDIOVERSION;  Surgeon: Katrina Pain, MD;  Location: [facility];  Service: Cardiovascular;  Laterality: N/A;   CATARACT EXTRACTION W/ INTRAOCULAR LENS IMPLANT Left    DILATION AND CURETTAGE OF UTERUS     HYSTEROSCOPY WITH D & C  03/16/1999   INSERTION OF MESH N/A 08/20/2016   Procedure: INSERTION OF MESH;  Surgeon: Katrina Ok, MD;  Location: [facility];  Service: General;  Laterality: N/A;   TEE WITHOUT CARDIOVERSION N/A 11/06/2012   Procedure: TRANSESOPHAGEAL ECHOCARDIOGRAM (TEE);  Surgeon: Katrina Perla, MD;  Location: [facility] ENDOSCOPY;  Service: Cardiovascular;  Laterality: N/A;   TONSILLECTOMY  1937   VAGINAL HYSTERECTOMY  09/2005   Anterior repair; Removal of urethral caruncle./notes 09/02/2015    Social History   Tobacco Use  Smoking Status Never Smoker  Smokeless Tobacco Never Used    Social History   Substance and Sexual Activity  Alcohol Use Yes    Alcohol/week: 1.0 standard drink   Types: 1 Glasses of wine per week   Comment: one glass of wine 1-2 days per week     Family History  Problem Relation Age of Onset   Hypertension Mother    Heart disease Father    Heart disease Brother    Emphysema Brother     Review of Systems: As noted in history of present illness. All other systems were reviewed and are negative.  Physical Exam: BP (!) 146/72    Pulse 70    Ht 5\' 6"  (1.676 m)    Wt 144 lb (65.3 kg)    BMI 23.24 kg/m  GENERAL:  Well appearing WF in NAD HEENT:  PERRL, EOMI, sclera are clear. Oropharynx is clear. NECK:  No jugular venous distention, carotid upstroke brisk and symmetric, no bruits, no thyromegaly or adenopathy LUNGS:  Clear to auscultation bilaterally CHEST:  Unremarkable HEART:  RRR,  PMI not displaced or sustained,S1 and S2 within normal limits, no S3, no S4: no clicks, no rubs, no murmurs ABD:  Soft, nontender. BS +, no masses or bruits. No hepatomegaly, no splenomegaly EXT:  2 + pulses throughout, no edema, no cyanosis no clubbing SKIN:  Warm and dry.  No rashes NEURO:  Alert and oriented x 3. Cranial nerves II through XII intact. PSYCH:  Cognitively intact      LABORATORY DATA: Lab Results  Component Value Date   WBC 13.4 (H) 12/27/2019   HGB 13.6 12/27/2019   HCT 42.5 12/27/2019   PLT 276 12/27/2019   GLUCOSE 112 (H) 12/27/2019   CHOL 155 04/24/2018   TRIG 171 (H) 04/24/2018   HDL 50 04/24/2018   LDLCALC 71 04/24/2018   ALT 24 12/27/2019   AST 58 (H) 12/27/2019   NA 139 12/27/2019   K 3.6 12/27/2019   CL 103 12/27/2019   CREATININE 0.82 12/27/2019   BUN 19 12/27/2019   CO2 26 12/27/2019   TSH 2.350 02/05/2019   INR 1.5 (H) 12/27/2019   HGBA1C 5.9 (A) 12/04/2019    Echo 07/04/16: Study Conclusions  - Left ventricle: The cavity size was normal. Wall thickness was   increased in a pattern of mild LVH. Systolic function was normal.   The estimated ejection fraction was in the  range of 50% to 55%.   Wall motion was normal; there were no regional wall motion   abnormalities. Doppler parameters are consistent with abnormal   left ventricular relaxation (grade 1 diastolic dysfunction). - Mitral valve: Moderately calcified annulus. There was moderate   regurgitation. - Left atrium: The atrium was mildly dilated. - Right atrium: The atrium was mildly dilated.  Assessment / Plan: 1. TIA. On long-term anticoagulation with Eliquis. Tolerating well.   2. Paroxysmal atrial fibrillation.  Continue rate control with metoprolol and cardizem. If she were to have more sustained or symptomatic Afib would consider AAD therapy with Flecainide vs amiodarone.   3. Hypertension- BP well controlled.  4. Hyperlipidemia-on pravastatin with good results.  5. S/p Nissen fundoplication.   I will follow up in 6 months.

## 2020-05-28 ENCOUNTER — Encounter: Payer: Self-pay | Admitting: Cardiology

## 2020-05-28 ENCOUNTER — Ambulatory Visit (INDEPENDENT_AMBULATORY_CARE_PROVIDER_SITE_OTHER): Payer: Medicare Other | Admitting: Cardiology

## 2020-05-28 ENCOUNTER — Other Ambulatory Visit: Payer: Self-pay

## 2020-05-28 VITALS — BP 146/72 | HR 70 | Ht 66.0 in | Wt 144.0 lb

## 2020-05-28 DIAGNOSIS — E785 Hyperlipidemia, unspecified: Secondary | ICD-10-CM | POA: Diagnosis not present

## 2020-05-28 DIAGNOSIS — I10 Essential (primary) hypertension: Secondary | ICD-10-CM

## 2020-05-28 DIAGNOSIS — I480 Paroxysmal atrial fibrillation: Secondary | ICD-10-CM

## 2020-05-28 DIAGNOSIS — D6869 Other thrombophilia: Secondary | ICD-10-CM

## 2020-06-14 ENCOUNTER — Other Ambulatory Visit: Payer: Self-pay | Admitting: Cardiology

## 2020-06-17 ENCOUNTER — Telehealth: Payer: Self-pay

## 2020-06-17 MED ORDER — APIXABAN 5 MG PO TABS: ORAL_TABLET | ORAL | 1 refills | Status: DC

## 2020-06-17 NOTE — Telephone Encounter (Signed)
71f, 65.3kg, scr 0.82 12/27/19

## 2020-06-20 ENCOUNTER — Other Ambulatory Visit: Payer: Self-pay

## 2020-06-24 ENCOUNTER — Encounter: Payer: Self-pay | Admitting: Family Medicine

## 2020-08-21 ENCOUNTER — Emergency Department (HOSPITAL_BASED_OUTPATIENT_CLINIC_OR_DEPARTMENT_OTHER)
Admission: EM | Admit: 2020-08-21 | Discharge: 2020-08-21 | Disposition: A | Discharge status: Home / Self Care | Payer: Medicare Other | Attending: Emergency Medicine | Admitting: Emergency Medicine

## 2020-08-21 ENCOUNTER — Emergency Department (HOSPITAL_BASED_OUTPATIENT_CLINIC_OR_DEPARTMENT_OTHER): Payer: Medicare Other

## 2020-08-21 ENCOUNTER — Encounter (HOSPITAL_BASED_OUTPATIENT_CLINIC_OR_DEPARTMENT_OTHER): Payer: Self-pay | Admitting: *Deleted

## 2020-08-21 ENCOUNTER — Other Ambulatory Visit: Payer: Self-pay

## 2020-08-21 DIAGNOSIS — Z7901 Long term (current) use of anticoagulants: Secondary | ICD-10-CM | POA: Diagnosis not present

## 2020-08-21 DIAGNOSIS — I10 Essential (primary) hypertension: Secondary | ICD-10-CM | POA: Diagnosis not present

## 2020-08-21 DIAGNOSIS — Z8673 Personal history of transient ischemic attack (TIA), and cerebral infarction without residual deficits: Secondary | ICD-10-CM | POA: Diagnosis not present

## 2020-08-21 DIAGNOSIS — Z79899 Other long term (current) drug therapy: Secondary | ICD-10-CM | POA: Insufficient documentation

## 2020-08-21 DIAGNOSIS — R42 Dizziness and giddiness: Secondary | ICD-10-CM | POA: Diagnosis not present

## 2020-08-21 DIAGNOSIS — R531 Weakness: Secondary | ICD-10-CM | POA: Diagnosis not present

## 2020-08-21 LAB — TROPONIN I (HIGH SENSITIVITY)
Troponin I (High Sensitivity): 11 ng/L (ref <18)
Troponin I (High Sensitivity): 19 ng/L — ABNORMAL HIGH (ref <18)

## 2020-08-21 LAB — URINALYSIS, ROUTINE W REFLEX MICROSCOPIC
Bilirubin Urine: NEGATIVE
Glucose, UA: NEGATIVE mg/dL
Hgb urine dipstick: NEGATIVE
Ketones, ur: NEGATIVE mg/dL
Nitrite: NEGATIVE
Protein, ur: NEGATIVE mg/dL
Specific Gravity, Urine: <1.005 — ABNORMAL LOW (ref 1.005–1.030)
pH: 6 (ref 5.0–8.0)

## 2020-08-21 LAB — BASIC METABOLIC PANEL
Anion gap: 9 (ref 5–15)
BUN: 23 mg/dL (ref 8–23)
CO2: 28 mmol/L (ref 22–32)
Calcium: 9.7 mg/dL (ref 8.9–10.3)
Chloride: 103 mmol/L (ref 98–111)
Creatinine, Ser: 0.86 mg/dL (ref 0.44–1.00)
GFR, Estimated: >60 mL/min (ref >60)
Glucose, Bld: 114 mg/dL — ABNORMAL HIGH (ref 70–99)
Potassium: 3.5 mmol/L (ref 3.5–5.1)
Sodium: 140 mmol/L (ref 135–145)

## 2020-08-21 LAB — CBC
HCT: 42.9 % (ref 36.0–46.0)
Hemoglobin: 13.8 g/dL (ref 12.0–15.0)
MCH: 29.6 pg (ref 26.0–34.0)
MCHC: 32.2 g/dL (ref 30.0–36.0)
MCV: 91.9 fL (ref 80.0–100.0)
Platelets: 268 10*3/uL (ref 150–400)
RBC: 4.67 MIL/uL (ref 3.87–5.11)
RDW: 13.8 % (ref 11.5–15.5)
WBC: 6.8 10*3/uL (ref 4.0–10.5)
nRBC: 0 % (ref 0.0–0.2)

## 2020-08-21 MED ORDER — MECLIZINE HCL 25 MG PO TABS: 25.0000 mg | ORAL_TABLET | Freq: Three times a day (TID) | ORAL | 0 refills | Status: DC | PRN

## 2020-08-21 MED ORDER — MECLIZINE HCL 25 MG PO TABS
50.0000 mg | ORAL_TABLET | Freq: Once | ORAL | Status: AC
  Administered 2020-08-21: 50 mg via ORAL
  Filled 2020-08-21: qty 2

## 2020-08-21 NOTE — ED Triage Notes (Signed)
She almost fall but caught herself.  She think she is feeling dizzy but is not sure.

## 2020-08-21 NOTE — ED Provider Notes (Signed)
[facility] EMERGENCY DEPT Provider Note   CSN: [id number] Arrival date & time: 08/21/20  1418     History Chief Complaint  Patient presents with  . Dizziness    [name] is a 87 y.o. female w/ hx of parox A Fib on eliquis, presenting to the emergency department episode of dizziness.  The patient reports that she was at home today with a cleaning lady visiting.  She is up and move a trash can.  She says she turned around suddenly towards a bookshelf, and had abrupt onset of dizziness.  She describes a combination of vertigo and lightheadedness.  She says she stumbled into the bookshelf.  She did not fall or lose consciousness.  She feels improved now, but continues to have the sensation of lightheadedness, worse with standing up.  She also feels "shaky" in her arms and legs, but denies difficulty with speech, numbness or weakness.  She denies headache.  She has been compliant with all of her medications.  She has a history of paroxysmal A. fib, does not feel that she was having palpitations today, but is not entirely sure.  She says she was in her usual state of health otherwise this week and this morning.  She does have a history of recurrent UTIs is on chronic antibiotics for that, but is not actively having dysuria symptoms.  She has a walker at home but rarely uses it.  Her daughter lives nearby.  She lives independently.    HPI     Past Medical History:  Diagnosis Date  . Arthritis    "hands, little in my feet" (06/22/2016)  . Candidiasis of vulva and vagina   . Cystocele, midline   . Disorder of bone and cartilage, unspecified   . Fibroids   . Hypertension   . Large hiatal hernia 06/23/2016  . Migraine    "none in the last few years" (06/22/2016)  . Other chronic cystitis   . Other sign and symptom in breast   . Paroxysmal atrial fibrillation ([place]) 11/30/2012  . Pneumonia    "I've had walking pneumonia a couple times"  (06/22/2016)  . Pneumonia dx'd 06/15/2016  .  Rectal incontinence   . Stroke [facility]) 2014   hx of mini stroke   . TIA (transient ischemic attack) 2015    Patient Active Problem List   Diagnosis Date Noted  . Glucose intolerance 12/04/2019  . S/P Nissen fundoplication (without gastrostomy tube) procedure 08/20/2016  . Abnormal TSH 06/26/2016  . Large hiatal hernia 06/23/2016  . Intermittent atrial fibrillation ([place]) 11/30/2012  . Thoracic aorta atherosclerosis ([place]) 10/25/2012  . Essential hypertension, benign 09/29/2012  . History of TIA (transient ischemic attack) 08/26/2012  . H/O chronic cystitis 08/26/2012  . Hyperlipidemia with target LDL less than 130 06/29/2011  . Osteopenia 06/29/2011  . Allergic rhinitis, mild 06/29/2011    Past Surgical History:  Procedure Laterality Date  . CARDIOVERSION N/A 06/29/2016   Procedure: CARDIOVERSION;  Surgeon: Jerline Pain, MD;  Location: [facility] OR;  Service: Cardiovascular;  Laterality: N/A;  . CATARACT EXTRACTION W/ INTRAOCULAR LENS IMPLANT Left   . DILATION AND CURETTAGE OF UTERUS    . HYSTEROSCOPY WITH D & C  03/16/1999  . INSERTION OF MESH N/A 08/20/2016   Procedure: INSERTION OF MESH;  Surgeon: Ralene Ok, MD;  Location: [facility];  Service: General;  Laterality: N/A;  . TEE WITHOUT CARDIOVERSION N/A 11/06/2012   Procedure: TRANSESOPHAGEAL ECHOCARDIOGRAM (TEE);  Surgeon: Lelon Perla, MD;  Location: [facility];  Service: Cardiovascular;  Laterality: N/A;  . TONSILLECTOMY  1937  . VAGINAL HYSTERECTOMY  09/2005   Anterior repair; Removal of urethral caruncle./notes 09/02/2015     OB History    Gravida  2   Para  2   Term      Preterm      AB      Living        SAB      IAB      Ectopic      Multiple      Live Births              Family History  Problem Relation Age of Onset  . Hypertension Mother   . Heart disease Father   . Heart disease Brother   . Emphysema Brother     Social History   Tobacco Use  . Smoking status: Never Smoker  .  Smokeless tobacco: Never Used  Vaping Use  . Vaping Use: Never used  Substance Use Topics  . Alcohol use: Yes    Alcohol/week: 1.0 standard drink    Types: 1 Glasses of wine per week    Comment: one glass of wine 1-2 days per week   . Drug use: No    Home Medications Prior to Admission medications   Medication Sig Start Date End Date Taking? Authorizing Provider  ALPRAZolam (XANAX) 0.25 MG tablet TAKE 1 TABLET(0.25 MG) BY MOUTH TWICE DAILY AS NEEDED FOR ANXIETY 06/19/19  Yes [name], MD  apixaban (ELIQUIS) 5 MG TABS tablet TAKE 1 TABLET BY MOUTH TWICE DAILY. LABS NEEDED FOR FURTHER REFILLS 06/17/20  Yes [name], [name], MD  CALCIUM CARBONATE-VIT D-MIN PO Take 2 tablets by mouth 3 (three) times a week.    Yes [provider]  CRANBERRY PO Take 1 tablet by mouth daily.   Yes [provider]  DILT-XR 180 MG 24 hr capsule TAKE 1 CAPSULE BY MOUTH DAILY 06/16/20  Yes [name], [name], MD  diltiazem (CARDIZEM CD) 180 MG 24 hr capsule Take 1 capsule (180 mg total) by mouth daily. 07/26/16  Yes Almyra Deforest, PA  diltiazem (CARDIZEM) 30 MG tablet Take 1 tablet every 4 hours AS NEEDED for AFIB heart rate >100 10/25/19  Yes Sherran Needs, NP  Glucosamine-Chondroitin (COSAMIN DS PO) Take 1 tablet by mouth 2 (two) times daily.   Yes [provider]  meclizine (ANTIVERT) 25 MG tablet Take 1 tablet (25 mg total) by mouth 3 (three) times daily as needed for up to 21 doses for dizziness or nausea. 08/21/20  Yes Wyvonnia Dusky, MD  metoprolol tartrate (LOPRESSOR) 25 MG tablet TAKE 1 TABLET BY MOUTH TWICE DAILY 06/16/20  Yes [name], [name], MD  pravastatin (PRAVACHOL) 40 MG tablet TAKE 1 TABLET(40 MG) BY MOUTH DAILY 03/17/20  Yes [name], MD  Probiotic Product (PROBIOTIC DAILY) CAPS Take 1 capsule by mouth daily.   Yes [provider]  Vitamins-Lipotropics (LIPOFLAVONOID PO) Take 1 tablet by mouth 2 (two) times daily as needed (for ringing in ears).   Yes [provider]  acetaminophen (TYLENOL) 500 MG tablet Take 500-1,000 mg by mouth every 6 (six) hours as needed (for headache/pain.).    [provider]  budesonide (RHINOCORT AQUA) 32 MCG/ACT nasal spray Place 1 spray into both nostrils as needed.     [provider]  loratadine (CLARITIN) 10 MG tablet Take 10 mg by mouth daily as needed for  allergies.     [provider]  ondansetron (ZOFRAN) 4 MG tablet Take 1 tablet (4 mg total) by mouth every 8 (eight) hours as needed for nausea or vomiting. 07/07/16   [name], MD  Polyethyl Glycol-Propyl Glycol (SYSTANE OP) Place 1 drop into both eyes 2 (two) times daily as needed (for dry eyes).     [provider]  Psyllium (METAMUCIL FIBER PO) Take by mouth as needed.     [provider]    Allergies    Lactose, Lisinopril, and Bactrim [sulfamethoxazole-trimethoprim]  Review of Systems   Review of Systems  Constitutional: Negative for chills and fever.  HENT: Negative for ear pain and sore throat.   Eyes: Negative for pain and visual disturbance.  Respiratory: Negative for cough and shortness of breath.   Cardiovascular: Negative for chest pain and palpitations.  Gastrointestinal: Negative for abdominal pain, nausea and vomiting.  Genitourinary: Negative for dysuria and hematuria.  Musculoskeletal: Negative for arthralgias and myalgias.  Skin: Negative for color change and rash.  Neurological: Positive for light-headedness. Negative for syncope.  All other systems reviewed and are negative.   Physical Exam Updated Vital Signs BP (!) 164/53   Pulse (!) 51   Temp 98.2 F (36.8 C) (Oral)   Resp 17   Ht 5\' 6"  (1.676 m)   Wt 59 kg   SpO2 100%   BMI 20.98 kg/m   Physical Exam Constitutional:      General: She is not in acute distress. HENT:     Head: Normocephalic and atraumatic.  Eyes:     Conjunctiva/sclera: Conjunctivae normal.     Pupils: Pupils are equal, round, and reactive to  light.  Cardiovascular:     Rate and Rhythm: Normal rate and regular rhythm.  Pulmonary:     Effort: Pulmonary effort is normal. No respiratory distress.  Abdominal:     General: There is no distension.     Tenderness: There is no abdominal tenderness.  Skin:    General: Skin is warm and dry.  Neurological:     General: No focal deficit present.     Mental Status: She is alert. Mental status is at baseline.     GCS: GCS eye subscore is 4. GCS verbal subscore is 5. GCS motor subscore is 6.     Cranial Nerves: Cranial nerves are intact.     Sensory: Sensation is intact.     Motor: Motor function is intact.     Coordination: Coordination is intact. Finger-Nose-Finger Test normal.     Gait: Gait is intact.  Psychiatric:        Mood and Affect: Mood normal.        Behavior: Behavior normal.     ED Results / Procedures / Treatments   Labs (all labs ordered are listed, but only abnormal results are displayed) Labs Reviewed  BASIC METABOLIC PANEL - Abnormal; Notable for the following components:      Result Value   Glucose, Bld 114 (*)    All other components within normal limits  URINALYSIS, ROUTINE W REFLEX MICROSCOPIC - Abnormal; Notable for the following components:   Color, Urine COLORLESS (*)    Specific Gravity, Urine <1.005 (*)    Leukocytes,Ua TRACE (*)    All other components within normal limits  TROPONIN I (HIGH SENSITIVITY) - Abnormal; Notable for the following components:   Troponin I (High Sensitivity) 19 (*)    All other components within normal limits  CBC  TROPONIN I (HIGH SENSITIVITY)    EKG EKG Interpretation  Date/Time:  Thursday Aug 21 2020 14:28:23 EDT Ventricular Rate:  67 PR Interval:  208 QRS Duration: 82 QT Interval:  416 QTC Calculation: 439 R Axis:   -70 Text Interpretation: Normal sinus rhythm Low voltage QRS Left anterior fascicular block Septal infarct , age undetermined Lateral infarct , age undetermined Abnormal ECG lower voltage than  prior 9/21 Confirmed by Aletta Edouard [phone]) on 08/21/2020 2:31:07 PM   Radiology CT Head Wo Contrast  Result Date: 08/21/2020 CLINICAL DATA:  Dizziness.  Fell.  Taking Eliquis. EXAM: CT HEAD WITHOUT CONTRAST TECHNIQUE: Contiguous axial images were obtained from the base of the skull through the vertex without intravenous contrast. COMPARISON:  12/27/2019 FINDINGS: Brain: Stable mildly enlarged ventricles and cortical sulci. Stable mild patchy white matter low density in both cerebral hemispheres. No intracranial hemorrhage, mass lesion or CT evidence of acute infarction. Vascular: No hyperdense vessel or unexpected calcification. Skull: Mild bilateral hyperostosis frontalis. Sinuses/Orbits: Unremarkable. Other: Mild left concha bullosa. IMPRESSION: 1. No acute abnormality. 2. Stable mild atrophy and mild chronic white matter ischemic changes. Electronically Signed   By: Claudie Revering M.D.   On: 08/21/2020 15:54    Procedures Procedures   Medications Ordered in ED Medications  meclizine (ANTIVERT) tablet 50 mg (50 mg Oral Given 08/21/20 1603)    ED Course  I have reviewed the triage vital signs and the nursing notes.  Pertinent labs & imaging results that were available during my care of the patient were reviewed by me and considered in my medical decision making (see chart for details).  This patient complains of lightheadedness/vertigo.  This involves an extensive number of treatment options, and is a complaint that carries with it a high risk of complications and morbidity.  The differential diagnosis includes arrythmia vs infection vs anemia vs dehydration vs medical side effect vs peripheral vertigo vs other  I ordered, reviewed, and interpreted labs.  No life-threatening abnormalities were noted on these tests.  UA without sign of infection.  Hgb normal - no anemia. I ordered medication meclizine for vertigo I ordered imaging studies which included CT head - given her possible fall and  head injury, on eliquis I independently visualized and interpreted imaging which showed no life-threatening abnormalities, and the monitor tracing which showed sinus bradycardia (Hr 55 bpm) - doubt she is symptomatic from this Additional history was obtained from daughter at bedside  Lower suspicion for CVA/stroke with this clinical presentation.  She is on eliquis already.   Discussed walker use at home, PCP f/u.   Clinical Course as of 08/22/20 0022  Thu Aug 21, 2020  1800 She is feeling better now.  Her daughter is present at bedside.  She is having a second troponin sent right now.  Labs and CT reviewed, no acute findings. [MT]  1855 Repeat Trope with only minimal elevation, still within the normal limits based on her prior troponin levels.  The patient was able to ambulate steadily.  I believe she is reasonably safe for discharge.  We talked about using a walker at home, taking her time getting up.  She should follow-up with her PCP. [MT]  7846 She remains minimally symptomatic.  We will discharge [MT]    Clinical Course User Index [MT] Pacey Willadsen, Carola Rhine, MD    Final Clinical Impression(s) / ED Diagnoses Final diagnoses:  Dizziness    Rx / DC Orders ED Discharge Orders  Ordered    meclizine (ANTIVERT) 25 MG tablet  3 times daily PRN        08/21/20 1900           Ewell Benassi, Carola Rhine, MD 08/22/20 [phone]

## 2020-08-21 NOTE — Discharge Instructions (Signed)
Please call your primary care doctor's office and ask for follow-up appointment tomorrow or on Monday.  We treated you for an episode of dizziness today.  Your work-up and CT scans were reassuring.  We did talk about the small possibility of stroke in the back of the brain.  I have a lower suspicion for this type of stroke at this time.  However you should schedule a follow-up appointment with your primary care doctor.  Your doctor may wish to order an MRI of your brain.  You can continue taking your Eliquis and other medications.  If you have worsening dizziness, lightheadedness, severe headache, loss of vision, difficulty speaking, numbness or weakness in your arms or legs, please call 911 return to the ER immediately.

## 2020-08-28 ENCOUNTER — Other Ambulatory Visit: Payer: Self-pay

## 2020-08-28 ENCOUNTER — Encounter (HOSPITAL_COMMUNITY): Payer: Self-pay | Admitting: Physician Assistant

## 2020-08-28 ENCOUNTER — Ambulatory Visit: Payer: Medicare Other | Admitting: Family Medicine

## 2020-08-28 ENCOUNTER — Encounter: Payer: Self-pay | Admitting: Family Medicine

## 2020-08-28 ENCOUNTER — Ambulatory Visit (HOSPITAL_COMMUNITY)
Admission: RE | Admit: 2020-08-28 | Discharge: 2020-08-28 | Disposition: A | Discharge status: Home / Self Care | Payer: Medicare Other | Source: Ambulatory Visit | Attending: Physician Assistant | Admitting: Physician Assistant

## 2020-08-28 VITALS — BP 180/94 | HR 64 | Temp 97.7°F | Ht 64.5 in | Wt 142.2 lb

## 2020-08-28 VITALS — BP 152/82 | Ht 64.5 in | Wt 142.2 lb

## 2020-08-28 DIAGNOSIS — F419 Anxiety disorder, unspecified: Secondary | ICD-10-CM

## 2020-08-28 DIAGNOSIS — Z79899 Other long term (current) drug therapy: Secondary | ICD-10-CM | POA: Insufficient documentation

## 2020-08-28 DIAGNOSIS — R001 Bradycardia, unspecified: Secondary | ICD-10-CM | POA: Diagnosis not present

## 2020-08-28 DIAGNOSIS — Z8249 Family history of ischemic heart disease and other diseases of the circulatory system: Secondary | ICD-10-CM | POA: Diagnosis not present

## 2020-08-28 DIAGNOSIS — D6869 Other thrombophilia: Secondary | ICD-10-CM | POA: Diagnosis not present

## 2020-08-28 DIAGNOSIS — I10 Essential (primary) hypertension: Secondary | ICD-10-CM | POA: Insufficient documentation

## 2020-08-28 DIAGNOSIS — R42 Dizziness and giddiness: Secondary | ICD-10-CM | POA: Insufficient documentation

## 2020-08-28 DIAGNOSIS — I480 Paroxysmal atrial fibrillation: Secondary | ICD-10-CM | POA: Insufficient documentation

## 2020-08-28 DIAGNOSIS — Z7901 Long term (current) use of anticoagulants: Secondary | ICD-10-CM | POA: Diagnosis not present

## 2020-08-28 MED ORDER — ALPRAZOLAM 0.25 MG PO TABS: ORAL_TABLET | ORAL | 0 refills | Status: DC

## 2020-08-28 MED ORDER — AMLODIPINE BESYLATE 5 MG PO TABS: 5.0000 mg | ORAL_TABLET | Freq: Every day | ORAL | 3 refills | Status: DC

## 2020-08-28 NOTE — Patient Instructions (Signed)
Using meclizine only as needed for dizziness.  We need to get up and move move slowly from one position to another

## 2020-08-28 NOTE — Progress Notes (Signed)
Primary Care Physician: [name], MD Referring Physician: NL  Cardiologist: Dr. [name]    [name] is a 87 y.o. female with a h/o paroxysmal afib, prior TIA, HTN who presents for follow up in the [facility]. She is on apixaban for a CHA2DS2VASc score of 6. Patient reports that overall her afib has been well controlled. She will occasionally take a PRN diltiazem which quickly resolves her palpitations.  On 08/21/20 she had an episode of dizziness and almost fell. She did not lose consciousness. She went to the ED and evaluation at that time was unremarkable. She was in SR. Vertigo was suspected and she was started on meclizine. However, since then she has still been lightheaded and just "not feeling normal." She saw her PCP and there was concern that bradycardia could be contributing to her symptoms. Her son who accompanies her today reports that she frequently has heart rates in the 65s and 40s.   Today, she denies symptoms of palpitations, chest pain, shortness of breath, orthopnea, PND, lower extremity edema, presyncope, syncope, or neurologic sequela. The patient is tolerating medications without difficulties and is otherwise without complaint today.   Past Medical History:  Diagnosis Date  . Arthritis    "hands, little in my feet" (06/22/2016)  . Candidiasis of vulva and vagina   . Cystocele, midline   . Disorder of bone and cartilage, unspecified   . Fibroids   . Hypertension   . Large hiatal hernia 06/23/2016  . Migraine    "none in the last few years" (06/22/2016)  . Other chronic cystitis   . Other sign and symptom in breast   . Paroxysmal atrial fibrillation ([place]) 11/30/2012  . Pneumonia    "I've had walking pneumonia a couple times"  (06/22/2016)  . Pneumonia dx'd 06/15/2016  . Rectal incontinence   . Stroke [facility]) 2014   hx of mini stroke   . TIA (transient ischemic attack) 2015   Past Surgical History:  Procedure Laterality Date  .  CARDIOVERSION N/A 06/29/2016   Procedure: CARDIOVERSION;  Surgeon: Jerline Pain, MD;  Location: [facility] OR;  Service: Cardiovascular;  Laterality: N/A;  . CATARACT EXTRACTION W/ INTRAOCULAR LENS IMPLANT Left   . DILATION AND CURETTAGE OF UTERUS    . HYSTEROSCOPY WITH D & C  03/16/1999  . INSERTION OF MESH N/A 08/20/2016   Procedure: INSERTION OF MESH;  Surgeon: Ralene Ok, MD;  Location: [facility] ORS;  Service: General;  Laterality: N/A;  . TEE WITHOUT CARDIOVERSION N/A 11/06/2012   Procedure: TRANSESOPHAGEAL ECHOCARDIOGRAM (TEE);  Surgeon: Lelon Perla, MD;  Location: [facility] ENDOSCOPY;  Service: Cardiovascular;  Laterality: N/A;  . TONSILLECTOMY  1937  . VAGINAL HYSTERECTOMY  09/2005   Anterior repair; Removal of urethral caruncle./notes 09/02/2015    Current Outpatient Medications  Medication Sig Dispense Refill  . acetaminophen (TYLENOL) 500 MG tablet Take 500-1,000 mg by mouth every 6 (six) hours as needed (for headache/pain.).    Marland Kitchen ALPRAZolam (XANAX) 0.25 MG tablet TAKE 1 TABLET(0.25 MG) BY MOUTH TWICE DAILY AS NEEDED FOR ANXIETY 30 tablet 0  . amLODipine (NORVASC) 5 MG tablet Take 1 tablet (5 mg total) by mouth daily. 30 tablet 3  . apixaban (ELIQUIS) 5 MG TABS tablet TAKE 1 TABLET BY MOUTH TWICE DAILY. LABS NEEDED FOR FURTHER REFILLS 180 tablet 1  . budesonide (RHINOCORT AQUA) 32 MCG/ACT nasal spray Place 1 spray into both nostrils as needed.     Marland Kitchen CALCIUM CARBONATE-VIT D-MIN  PO Take 2 tablets by mouth 3 (three) times a week.     Marland Kitchen CRANBERRY PO Take 1 tablet by mouth daily.    Marland Kitchen diltiazem (CARDIZEM) 30 MG tablet Take 1 tablet every 4 hours AS NEEDED for AFIB heart rate >100 45 tablet 1  . estradiol (ESTRACE) 0.1 MG/GM vaginal cream Place vaginally.    . Glucosamine-Chondroitin (COSAMIN DS PO) Take 1 tablet by mouth 2 (two) times daily.    Marland Kitchen loratadine (CLARITIN) 10 MG tablet Take 10 mg by mouth daily as needed for allergies.     Marland Kitchen meclizine (ANTIVERT) 25 MG tablet Take 1 tablet (25 mg total)  by mouth 3 (three) times daily as needed for up to 21 doses for dizziness or nausea. 21 tablet 0  . metoprolol tartrate (LOPRESSOR) 25 MG tablet TAKE 1 TABLET BY MOUTH TWICE DAILY 180 tablet 3  . nitrofurantoin, macrocrystal-monohydrate, (MACROBID) 100 MG capsule Take 100 mg by mouth daily.    . ondansetron (ZOFRAN) 4 MG tablet Take 1 tablet (4 mg total) by mouth every 8 (eight) hours as needed for nausea or vomiting. 20 tablet 0  . Polyethyl Glycol-Propyl Glycol (SYSTANE OP) Place 1 drop into both eyes 2 (two) times daily as needed (for dry eyes).     . pravastatin (PRAVACHOL) 40 MG tablet TAKE 1 TABLET(40 MG) BY MOUTH DAILY 90 tablet 3  . Probiotic Product (PROBIOTIC DAILY) CAPS Take 1 capsule by mouth daily.    . Psyllium (METAMUCIL FIBER PO) Take by mouth as needed.     . Vitamins-Lipotropics (LIPOFLAVONOID PO) Take 1 tablet by mouth 2 (two) times daily as needed (for ringing in ears).     No current facility-administered medications for this encounter.    Allergies  Allergen Reactions  . Lactose Other (See Comments)    Dairy Sensitivity  Dairy Sensitivity    . Lisinopril Other (See Comments)    DIZZY  . Bactrim [Sulfamethoxazole-Trimethoprim] Rash    Social History   Socioeconomic History  . Marital status: Widowed    Spouse name: Not on file  . Number of children: 2  . Years of education: Not on file  . Highest education level: Not on file  Occupational History  . Not on file  Tobacco Use  . Smoking status: Never Smoker  . Smokeless tobacco: Never Used  Vaping Use  . Vaping Use: Never used  Substance and Sexual Activity  . Alcohol use: Yes    Alcohol/week: 1.0 standard drink    Types: 1 Glasses of wine per week    Comment: one glass of wine 1-2 days per week   . Drug use: No  . Sexual activity: Not Currently  Other Topics Concern  . Not on file  Social History Narrative  . Not on file   Social Determinants of Health   Financial Resource Strain: Not on file   Food Insecurity: Not on file  Transportation Needs: Not on file  Physical Activity: Not on file  Stress: Not on file  Social Connections: Not on file  Intimate Partner Violence: Not on file    Family History  Problem Relation Age of Onset  . Hypertension Mother   . Heart disease Father   . Heart disease Brother   . Emphysema Brother     ROS- All systems are reviewed and negative except as per the HPI above  Physical Exam: Vitals:   08/28/20 1441  BP: (!) 152/82  Weight: 64.5 kg  Height: 5' 4.5" (1.638  m)   Wt Readings from Last 3 Encounters:  08/28/20 64.5 kg  08/28/20 64.5 kg  08/21/20 59 kg    Labs: Lab Results  Component Value Date   NA 140 08/21/2020   K 3.5 08/21/2020   CL 103 08/21/2020   CO2 28 08/21/2020   GLUCOSE 114 (H) 08/21/2020   BUN 23 08/21/2020   CREATININE 0.86 08/21/2020   CALCIUM 9.7 08/21/2020   MG 2.1 06/30/2016   Lab Results  Component Value Date   INR 1.5 (H) 12/27/2019   Lab Results  Component Value Date   CHOL 155 04/24/2018   HDL 50 04/24/2018   LDLCALC 71 04/24/2018   TRIG 171 (H) 04/24/2018    GEN- The patient is a well appearing elderly female, alert and oriented x 3 today.   HEENT-head normocephalic, atraumatic, sclera clear, conjunctiva pink, hearing intact, trachea midline. Lungs- Clear to ausculation bilaterally, normal work of breathing Heart- Regular rate and rhythm, no murmurs, rubs or gallops  GI- soft, NT, ND, + BS Extremities- no clubbing, cyanosis, or edema MS- no significant deformity or atrophy Skin- no rash or lesion Psych- euthymic mood, full affect Neuro- strength and sensation are intact   EKG- patient declined ECG today.   Assessment and Plan: 1. Paroxysmal atrial fibrillation  Patient having infrequent symptoms. Would not start AAD right now. Continue Eliquis 5 mg BID Continue diltiazem 30 mg PRN for heart racing. Continue Lopressor 25 mg BID  2. CHA2DS2VASc score of at least 6 Continue  apixaban 5 mg bid   3. HTN Will change diltiazem to amlodipine 5 mg daily.  Patient to keep BP log for review at next visit.   4. Dizziness  Suspect multifactorial. Medication changes as above.    Follow up in the AF clinic in 4 weeks.    [facility] [address] [place], [place] [address] [phone]

## 2020-08-28 NOTE — Progress Notes (Signed)
   Subjective:    Patient ID: [name], female    DOB: 08-Oct-1930, 87 y.o.   MRN: [id number]  HPI She is here for a recheck on difficulty with dizziness.  She was seen in the emergency room on May 5 for an episode of dizziness.  The emergency room record including dictation and blood work, EKG and CT scan was reviewed.  They did place her on meclizine which she has been taking regularly.  She does get drowsy from it.  She does have a history of A. fib and is on rate blocking medications including metoprolol and diltiazem.  She cannot relate the dizziness to any head position.   Review of Systems     Objective:   Physical Exam Alert and in no distress.  No carotid bruits noted.  DTRs normal.  Tympanic membranes and canals are normal. Pharyngeal area is normal. Neck is supple without adenopathy or thyromegaly. Cardiac exam shows a bradycardia without murmurs or gallops. Lungs are clear to auscultation.        Assessment & Plan:  Vertigo  Bradycardia  Anxiety - Plan: ALPRAZolam (XANAX) 0.25 MG tablet I explained to her and her son who is an anesthesiologist in [place] the fact that this is probably multifactorial.  Recommend she only take the meclizine on an as-needed basis.  Discussed moving from one position to another very slowly to minimize symptoms.  Recommend she discuss the bradycardia and possibly readjusting her cardiac medication as this could potentially also play a role in her vertigo symptoms.

## 2020-08-28 NOTE — Patient Instructions (Signed)
Stop Diltiazem 180mg     Start Amlodipine 5mg  once a day

## 2020-09-16 ENCOUNTER — Encounter: Payer: Self-pay | Admitting: Internal Medicine

## 2020-09-25 ENCOUNTER — Ambulatory Visit (HOSPITAL_COMMUNITY): Payer: Medicare Other | Admitting: Physician Assistant

## 2020-10-02 ENCOUNTER — Ambulatory Visit (HOSPITAL_COMMUNITY)
Admission: RE | Admit: 2020-10-02 | Discharge: 2020-10-02 | Disposition: A | Discharge status: Home / Self Care | Payer: Medicare Other | Source: Ambulatory Visit | Attending: Physician Assistant | Admitting: Physician Assistant

## 2020-10-02 ENCOUNTER — Encounter (HOSPITAL_COMMUNITY): Payer: Self-pay | Admitting: Physician Assistant

## 2020-10-02 ENCOUNTER — Other Ambulatory Visit: Payer: Self-pay

## 2020-10-02 VITALS — BP 118/64 | HR 88 | Ht 64.5 in | Wt 140.4 lb

## 2020-10-02 DIAGNOSIS — Z7901 Long term (current) use of anticoagulants: Secondary | ICD-10-CM | POA: Insufficient documentation

## 2020-10-02 DIAGNOSIS — I10 Essential (primary) hypertension: Secondary | ICD-10-CM | POA: Diagnosis not present

## 2020-10-02 DIAGNOSIS — D6869 Other thrombophilia: Secondary | ICD-10-CM | POA: Diagnosis not present

## 2020-10-02 DIAGNOSIS — Z8249 Family history of ischemic heart disease and other diseases of the circulatory system: Secondary | ICD-10-CM | POA: Insufficient documentation

## 2020-10-02 DIAGNOSIS — I480 Paroxysmal atrial fibrillation: Secondary | ICD-10-CM | POA: Diagnosis present

## 2020-10-02 DIAGNOSIS — Z79899 Other long term (current) drug therapy: Secondary | ICD-10-CM | POA: Insufficient documentation

## 2020-10-02 DIAGNOSIS — Z8673 Personal history of transient ischemic attack (TIA), and cerebral infarction without residual deficits: Secondary | ICD-10-CM | POA: Diagnosis not present

## 2020-10-02 NOTE — Progress Notes (Signed)
Primary Care Physician: [name], MD Referring Physician: NL  Cardiologist: Dr. [name]    [name] is a 87 y.o. female with a h/o paroxysmal afib, prior TIA, HTN who presents for follow up in the [facility]. She is on apixaban for a CHA2DS2VASc score of 6. Patient reports that overall her afib has been well controlled. She will occasionally take a PRN diltiazem which quickly resolves her palpitations.  On 08/21/20 she had an episode of dizziness and almost fell. She did not lose consciousness. She went to the ED and evaluation at that time was unremarkable. She was in SR. Vertigo was suspected and she was started on meclizine. However, since then she has still been lightheaded and just "not feeling normal." She saw her PCP and there was concern that bradycardia could be contributing to her symptoms. Her son who accompanied her reports that she frequently has heart rates in the 23s and 40s.   On follow up today, patient reports that she has done well since her last visit. Her BP has been well controlled on her home machine, heart rates 50s-60s. Suprisingly, she is in rate controlled afib today. She is minimally symptomatic with brief positional dizziness. She denies any bleeding issues on anticoagulation.   Today, she denies symptoms of palpitations, chest pain, shortness of breath, orthopnea, PND, lower extremity edema, presyncope, syncope, or neurologic sequela. The patient is tolerating medications without difficulties and is otherwise without complaint today.   Past Medical History:  Diagnosis Date   Arthritis    "hands, little in my feet" (06/22/2016)   Candidiasis of vulva and vagina    Cystocele, midline    Disorder of bone and cartilage, unspecified    Fibroids    Hypertension    Large hiatal hernia 06/23/2016   Migraine    "none in the last few years" (06/22/2016)   Other chronic cystitis    Other sign and symptom in breast    Paroxysmal atrial  fibrillation ([place]) 11/30/2012   Pneumonia    "I've had walking pneumonia a couple times"  (06/22/2016)   Pneumonia dx'd 06/15/2016   Rectal incontinence    Stroke ([place]) 2014   hx of mini stroke    TIA (transient ischemic attack) 2015   Past Surgical History:  Procedure Laterality Date   CARDIOVERSION N/A 06/29/2016   Procedure: CARDIOVERSION;  Surgeon: Jerline Pain, MD;  Location: [facility];  Service: Cardiovascular;  Laterality: N/A;   CATARACT EXTRACTION W/ INTRAOCULAR LENS IMPLANT Left    DILATION AND CURETTAGE OF UTERUS     HYSTEROSCOPY WITH D & C  03/16/1999   INSERTION OF MESH N/A 08/20/2016   Procedure: INSERTION OF MESH;  Surgeon: Ralene Ok, MD;  Location: [facility] ORS;  Service: General;  Laterality: N/A;   TEE WITHOUT CARDIOVERSION N/A 11/06/2012   Procedure: TRANSESOPHAGEAL ECHOCARDIOGRAM (TEE);  Surgeon: Lelon Perla, MD;  Location: [facility] ENDOSCOPY;  Service: Cardiovascular;  Laterality: N/A;   TONSILLECTOMY  1937   VAGINAL HYSTERECTOMY  09/2005   Anterior repair; Removal of urethral caruncle./notes 09/02/2015    Current Outpatient Medications  Medication Sig Dispense Refill   acetaminophen (TYLENOL) 500 MG tablet Take 500-1,000 mg by mouth every 6 (six) hours as needed (for headache/pain.).     ALPRAZolam (XANAX) 0.25 MG tablet TAKE 1 TABLET(0.25 MG) BY MOUTH TWICE DAILY AS NEEDED FOR ANXIETY 30 tablet 0   amLODipine (NORVASC) 5 MG tablet Take 1 tablet (5 mg total) by mouth  daily. 30 tablet 3   apixaban (ELIQUIS) 5 MG TABS tablet TAKE 1 TABLET BY MOUTH TWICE DAILY. LABS NEEDED FOR FURTHER REFILLS 180 tablet 1   budesonide (RHINOCORT AQUA) 32 MCG/ACT nasal spray Place 1 spray into both nostrils as needed.      CALCIUM CARBONATE-VIT D-MIN PO Take 2 tablets by mouth 3 (three) times a week.      CRANBERRY PO Take 1 tablet by mouth daily.     diltiazem (CARDIZEM) 30 MG tablet Take 1 tablet every 4 hours AS NEEDED for AFIB heart rate >100 45 tablet 1   estradiol (ESTRACE) 0.1 MG/GM  vaginal cream Place vaginally.     Glucosamine-Chondroitin (COSAMIN DS PO) Take 1 tablet by mouth 2 (two) times daily.     loratadine (CLARITIN) 10 MG tablet Take 10 mg by mouth daily as needed for allergies.      meclizine (ANTIVERT) 25 MG tablet Take 1 tablet (25 mg total) by mouth 3 (three) times daily as needed for up to 21 doses for dizziness or nausea. 21 tablet 0   metoprolol tartrate (LOPRESSOR) 25 MG tablet TAKE 1 TABLET BY MOUTH TWICE DAILY 180 tablet 3   nitrofurantoin, macrocrystal-monohydrate, (MACROBID) 100 MG capsule Take 100 mg by mouth daily.     ondansetron (ZOFRAN) 4 MG tablet Take 1 tablet (4 mg total) by mouth every 8 (eight) hours as needed for nausea or vomiting. 20 tablet 0   Polyethyl Glycol-Propyl Glycol (SYSTANE OP) Place 1 drop into both eyes 2 (two) times daily as needed (for dry eyes).      pravastatin (PRAVACHOL) 40 MG tablet TAKE 1 TABLET(40 MG) BY MOUTH DAILY 90 tablet 3   Probiotic Product (PROBIOTIC DAILY) CAPS Take 1 capsule by mouth daily.     Psyllium (METAMUCIL FIBER PO) Take by mouth as needed.      Vitamins-Lipotropics (LIPOFLAVONOID PO) Take 1 tablet by mouth 2 (two) times daily as needed (for ringing in ears).     No current facility-administered medications for this visit.    Allergies  Allergen Reactions   Lactose Other (See Comments)    Dairy Sensitivity  Dairy Sensitivity     Lisinopril Other (See Comments)    DIZZY   Bactrim [Sulfamethoxazole-Trimethoprim] Rash    Social History   Socioeconomic History   Marital status: Widowed    Spouse name: Not on file   Number of children: 2   Years of education: Not on file   Highest education level: Not on file  Occupational History   Not on file  Tobacco Use   Smoking status: Never   Smokeless tobacco: Never  Vaping Use   Vaping Use: Never used  Substance and Sexual Activity   Alcohol use: Yes    Alcohol/week: 1.0 standard drink    Types: 1 Glasses of wine per week    Comment: one  glass of wine 1-2 days per week    Drug use: No   Sexual activity: Not Currently  Other Topics Concern   Not on file  Social History Narrative   Not on file   Social Determinants of Health   Financial Resource Strain: Not on file  Food Insecurity: Not on file  Transportation Needs: Not on file  Physical Activity: Not on file  Stress: Not on file  Social Connections: Not on file  Intimate Partner Violence: Not on file    Family History  Problem Relation Age of Onset   Hypertension Mother    Heart disease  Father    Heart disease Brother    Emphysema Brother     ROS- All systems are reviewed and negative except as per the HPI above  Physical Exam: There were no vitals filed for this visit.  Wt Readings from Last 3 Encounters:  08/28/20 64.5 kg  08/28/20 64.5 kg  08/21/20 59 kg    Labs: Lab Results  Component Value Date   NA 140 08/21/2020   K 3.5 08/21/2020   CL 103 08/21/2020   CO2 28 08/21/2020   GLUCOSE 114 (H) 08/21/2020   BUN 23 08/21/2020   CREATININE 0.86 08/21/2020   CALCIUM 9.7 08/21/2020   MG 2.1 06/30/2016   Lab Results  Component Value Date   INR 1.5 (H) 12/27/2019   Lab Results  Component Value Date   CHOL 155 04/24/2018   HDL 50 04/24/2018   LDLCALC 71 04/24/2018   TRIG 171 (H) 04/24/2018    GEN- The patient is a well appearing elderly female, alert and oriented x 3 today.   HEENT-head normocephalic, atraumatic, sclera clear, conjunctiva pink, hearing intact, trachea midline. Lungs- Clear to ausculation bilaterally, normal work of breathing Heart- irregular rate and rhythm, no murmurs, rubs or gallops  GI- soft, NT, ND, + BS Extremities- no clubbing, cyanosis, or edema MS- no significant deformity or atrophy Skin- no rash or lesion Psych- euthymic mood, full affect Neuro- strength and sensation are intact   EKG- afib Vent. rate 88 BPM PR interval * ms QRS duration 80 ms QT/QTcB 360/435 ms   Assessment and Plan: 1.  Paroxysmal atrial fibrillation  Patient in rate controlled afib today. We discussed therapeutic options. She does not feel that she has afib very often now that her UTIs are treated. Educated about when to take PRN diltiazem. Will continue present therapy for now. We discussed possibility of AAD if needed.  Continue Eliquis 5 mg BID Continue diltiazem 30 mg PRN for heart racing. Continue Lopressor 25 mg BID  2. CHA2DS2VASc score of at least 6 Continue apixaban 5 mg bid   3. HTN Stable, no changes today.     Follow up with Dr [name] as scheduled. AF clinic in 6 months, sooner if more frequent afib to discuss AAD.    [facility] [address] [place], [place] [address] [phone]

## 2020-12-07 NOTE — Progress Notes (Signed)
[name] Date of Birth: [birth year] Medical Record [id number]  History of Present Illness: [name] is seen for followup for history of TIA and paroxysmal atrial fibrillation.  She is on anticoagulation with Eliquis.  She is on metoprolol and cardizem for rate control and prior event monitor showed good control when she is in Afib.  She  was admitted in March 2018 with nausea vomiting and the chest pain. Initial EKG showed normal sinus rhythm, CT of abdomen and pelvis showed large intrathoracic gastric hernia, no wall thickening all pneumatosis. Finding most likely representing spontaneous interval reduction of gastric volvulus. KUB was unremarkable. Ultrasound of abdomen does not show any gallstones. During the hospitalization, she developed worsening shortness of breath after aggressive IV hydration. She was treated with IV Lasix afterward with good urinary output. She was also found to be hypothyroid and received thyroid replacement. Her hospital stay was complicated by atrial fibrillation with RVR. She was  on eliquis. Troponin was elevated and a BNP was 836. Cardiology was consulted for atrial fibrillation and elevated troponin. Elevated troponin was felt to be related to demand ischemia with heart failure and the pneumonia. Echocardiogram obtained on 06/28/2016 showed EF [id number], grade 1 diastolic dysfunction. She required cardioversion on 06/29/2016 but was unable to maintain NSR.   On 08/21/16 she was readmitted and underwent Nissen fundoplication without complication.   Last summer she was seen in the Afib clinic with Afib. She was treated with long acting Cardizem as well as 30 mg po prn. She states this has helped. She is seen today with her son who is an anesthesiologist in [place]. She still has Afib. Not really sustained. Has a Cadionet monitor but doesn't like to use it. Has had recurrent Ecoli UTIs. Planning to see urology. She is at independent living at [facility]    Her husband  did pass away this year and it has been a tough adjustment for her.   She has been followed in the Afib clinic. Last seen in June and was in Afib with controlled rate. Minimally symptomatic. Continued on lopressor and Eliquis with prn diltiazem.   She is seen with her Daughter in law today. She thinks she has Afib about once a week. Generally just rests and it resolves. Does feel tired afterwards. Rarely has taken extra cardizem. She does do a lot of walking.   Current Outpatient Medications on File Prior to Visit  Medication Sig Dispense Refill   acetaminophen (TYLENOL) 500 MG tablet Take 500-1,000 mg by mouth every 6 (six) hours as needed (for headache/pain.).     ALPRAZolam (XANAX) 0.25 MG tablet TAKE 1 TABLET(0.25 MG) BY MOUTH TWICE DAILY AS NEEDED FOR ANXIETY 30 tablet 0   apixaban (ELIQUIS) 5 MG TABS tablet TAKE 1 TABLET BY MOUTH TWICE DAILY. LABS NEEDED FOR FURTHER REFILLS 180 tablet 1   budesonide (RHINOCORT AQUA) 32 MCG/ACT nasal spray Place 1 spray into both nostrils as needed.      CALCIUM CARBONATE-VIT D-MIN PO Take 2 tablets by mouth 3 (three) times a week.      CRANBERRY PO Take 1 tablet by mouth daily.     diltiazem (CARDIZEM) 30 MG tablet Take 1 tablet every 4 hours AS NEEDED for AFIB heart rate >100 45 tablet 1   estradiol (ESTRACE) 0.1 MG/GM vaginal cream Place vaginally.     Glucosamine-Chondroitin (COSAMIN DS PO) Take 1 tablet by mouth 2 (two) times daily.     loratadine (CLARITIN) 10 MG tablet Take 10 mg  by mouth daily as needed for allergies.      meclizine (ANTIVERT) 25 MG tablet Take 1 tablet (25 mg total) by mouth 3 (three) times daily as needed for up to 21 doses for dizziness or nausea. 21 tablet 0   metoprolol tartrate (LOPRESSOR) 25 MG tablet TAKE 1 TABLET BY MOUTH TWICE DAILY 180 tablet 3   nitrofurantoin, macrocrystal-monohydrate, (MACROBID) 100 MG capsule Take 100 mg by mouth daily.     ondansetron (ZOFRAN) 4 MG tablet Take 1 tablet (4 mg total) by mouth every 8  (eight) hours as needed for nausea or vomiting. 20 tablet 0   Polyethyl Glycol-Propyl Glycol (SYSTANE OP) Place 1 drop into both eyes 2 (two) times daily as needed (for dry eyes).      pravastatin (PRAVACHOL) 40 MG tablet TAKE 1 TABLET(40 MG) BY MOUTH DAILY 90 tablet 3   Probiotic Product (PROBIOTIC DAILY) CAPS Take 2 capsules by mouth daily.     Psyllium (METAMUCIL FIBER PO) Take by mouth as needed.      Vitamins-Lipotropics (LIPOFLAVONOID PO) Take 1 tablet by mouth 2 (two) times daily as needed (for ringing in ears).     No current facility-administered medications on file prior to visit.    Allergies  Allergen Reactions   Lactose Other (See Comments)    Dairy Sensitivity  Dairy Sensitivity     Lisinopril Other (See Comments)    DIZZY   Bactrim [Sulfamethoxazole-Trimethoprim] Rash    Past Medical History:  Diagnosis Date   Arthritis    "hands, little in my feet" (06/22/2016)   Candidiasis of vulva and vagina    Cystocele, midline    Disorder of bone and cartilage, unspecified    Fibroids    Hypertension    Large hiatal hernia 06/23/2016   Migraine    "none in the last few years" (06/22/2016)   Other chronic cystitis    Other sign and symptom in breast    Paroxysmal atrial fibrillation ([place]) 11/30/2012   Pneumonia    "I've had walking pneumonia a couple times"  (06/22/2016)   Pneumonia dx'd 06/15/2016   Rectal incontinence    Stroke ([place]) 2014   hx of mini stroke    TIA (transient ischemic attack) 2015    Past Surgical History:  Procedure Laterality Date   CARDIOVERSION N/A 06/29/2016   Procedure: CARDIOVERSION;  Surgeon: Jerline Pain, MD;  Location: [facility];  Service: Cardiovascular;  Laterality: N/A;   CATARACT EXTRACTION W/ INTRAOCULAR LENS IMPLANT Left    DILATION AND CURETTAGE OF UTERUS     HYSTEROSCOPY WITH D & C  03/16/1999   INSERTION OF MESH N/A 08/20/2016   Procedure: INSERTION OF MESH;  Surgeon: Ralene Ok, MD;  Location: [facility];  Service: General;   Laterality: N/A;   TEE WITHOUT CARDIOVERSION N/A 11/06/2012   Procedure: TRANSESOPHAGEAL ECHOCARDIOGRAM (TEE);  Surgeon: Lelon Perla, MD;  Location[facility];  Service: Cardiovascular;  Laterality: N/A;   TONSILLECTOMY  1937   VAGINAL HYSTERECTOMY  09/2005   Anterior repair; Removal of urethral caruncle./notes 09/02/2015    Social History   Tobacco Use  Smoking Status Never  Smokeless Tobacco Never    Social History   Substance and Sexual Activity  Alcohol Use Yes   Alcohol/week: 1.0 standard drink   Types: 1 Glasses of wine per week   Comment: one glass of wine 1-2 days per week     Family History  Problem Relation Age of Onset   Hypertension Mother  Heart disease Father    Heart disease Brother    Emphysema Brother     Review of Systems: As noted in history of present illness. All other systems were reviewed and are negative.  Physical Exam: BP 128/68   Pulse 60   Resp 20   Ht '5\' 6"'$  (1.676 m)   Wt 146 lb 3.2 oz (66.3 kg)   SpO2 100%   BMI 23.60 kg/m  GENERAL:  Well appearing WF in NAD HEENT:  PERRL, EOMI, sclera are clear. Oropharynx is clear. NECK:  No jugular venous distention, carotid upstroke brisk and symmetric, no bruits, no thyromegaly or adenopathy LUNGS:  Clear to auscultation bilaterally CHEST:  Unremarkable HEART:  RRR,  PMI not displaced or sustained,S1 and S2 within normal limits, no S3, no S4: no clicks, no rubs, no murmurs ABD:  Soft, nontender. BS +, no masses or bruits. No hepatomegaly, no splenomegaly EXT:  2 + pulses throughout, no edema, no cyanosis no clubbing SKIN:  Warm and dry.  No rashes NEURO:  Alert and oriented x 3. Cranial nerves II through XII intact. PSYCH:  Cognitively intact      LABORATORY DATA: Lab Results  Component Value Date   WBC 6.8 08/21/2020   HGB 13.8 08/21/2020   HCT 42.9 08/21/2020   PLT 268 08/21/2020   GLUCOSE 114 (H) 08/21/2020   CHOL 155 04/24/2018   TRIG 171 (H) 04/24/2018   HDL 50  04/24/2018   LDLCALC 71 04/24/2018   ALT 24 12/27/2019   AST 58 (H) 12/27/2019   NA 140 08/21/2020   K 3.5 08/21/2020   CL 103 08/21/2020   CREATININE 0.86 08/21/2020   BUN 23 08/21/2020   CO2 28 08/21/2020   TSH 2.350 02/05/2019   INR 1.5 (H) 12/27/2019   HGBA1C 5.9 (A) 12/04/2019    Echo 07/04/16: Study Conclusions   - Left ventricle: The cavity size was normal. Wall thickness was   increased in a pattern of mild LVH. Systolic function was normal.   The estimated ejection fraction was in the range of 50% to 55%.   Wall motion was normal; there were no regional wall motion   abnormalities. Doppler parameters are consistent with abnormal   left ventricular relaxation (grade 1 diastolic dysfunction). - Mitral valve: Moderately calcified annulus. There was moderate   regurgitation. - Left atrium: The atrium was mildly dilated. - Right atrium: The atrium was mildly dilated.   Assessment / Plan: 1. TIA. On long-term anticoagulation with Eliquis. Tolerating well.   2. Paroxysmal atrial fibrillation.  Continue rate control with metoprolol. She seems to have learned to live with her AFib.  If she were to have more sustained or symptomatic Afib would consider AAD therapy. Keep follow up appt in Afib clinic.   3. Hypertension- BP well controlled.  4. Hyperlipidemia-on pravastatin with good results.  5. S/p Nissen fundoplication.   I will follow up in 6 months.

## 2020-12-11 ENCOUNTER — Encounter: Payer: Self-pay | Admitting: Cardiology

## 2020-12-11 ENCOUNTER — Other Ambulatory Visit: Payer: Self-pay

## 2020-12-11 ENCOUNTER — Ambulatory Visit: Payer: Medicare Other | Admitting: Cardiology

## 2020-12-11 VITALS — BP 128/68 | HR 60 | Resp 20 | Ht 66.0 in | Wt 146.2 lb

## 2020-12-11 DIAGNOSIS — I10 Essential (primary) hypertension: Secondary | ICD-10-CM

## 2020-12-11 DIAGNOSIS — I480 Paroxysmal atrial fibrillation: Secondary | ICD-10-CM

## 2020-12-11 DIAGNOSIS — E785 Hyperlipidemia, unspecified: Secondary | ICD-10-CM

## 2020-12-11 MED ORDER — AMLODIPINE BESYLATE 5 MG PO TABS: 5.0000 mg | ORAL_TABLET | Freq: Every day | ORAL | 3 refills | Status: DC

## 2020-12-14 ENCOUNTER — Ambulatory Visit (INDEPENDENT_AMBULATORY_CARE_PROVIDER_SITE_OTHER): Payer: Medicare Other | Admitting: Family Medicine

## 2020-12-14 ENCOUNTER — Other Ambulatory Visit: Payer: Self-pay

## 2020-12-14 DIAGNOSIS — Z0000 Encounter for general adult medical examination without abnormal findings: Secondary | ICD-10-CM

## 2020-12-14 NOTE — Patient Instructions (Signed)
Health Maintenance, Female Adopting a healthy lifestyle and getting preventive care are important in promoting health and wellness. Ask your health care provider about: The right schedule for you to have regular tests and exams. Things you can do on your own to prevent diseases and keep yourself healthy. What should I know about diet, weight, and exercise? Eat a healthy diet  Eat a diet that includes plenty of vegetables, fruits, low-fat dairy products, and lean protein. Do not eat a lot of foods that are high in solid fats, added sugars, or sodium.  Maintain a healthy weight Body mass index (BMI) is used to identify weight problems. It estimates body fat based on height and weight. Your health care provider can help determineyour BMI and help you achieve or maintain a healthy weight. Get regular exercise Get regular exercise. This is one of the most important things you can do for your health. Most adults should: Exercise for at least 150 minutes each week. The exercise should increase your heart rate and make you sweat (moderate-intensity exercise). Do strengthening exercises at least twice a week. This is in addition to the moderate-intensity exercise. Spend less time sitting. Even light physical activity can be beneficial. Watch cholesterol and blood lipids Have your blood tested for lipids and cholesterol at 85 years of age, then havethis test every 5 years. Have your cholesterol levels checked more often if: Your lipid or cholesterol levels are high. You are older than 85 years of age. You are at high risk for heart disease. What should I know about cancer screening? Depending on your health history and family history, you may need to have cancer screening at various ages. This may include screening for: Breast cancer. Cervical cancer. Colorectal cancer. Skin cancer. Lung cancer. What should I know about heart disease, diabetes, and high blood pressure? Blood pressure and heart  disease High blood pressure causes heart disease and increases the risk of stroke. This is more likely to develop in people who have high blood pressure readings, are of African descent, or are overweight. Have your blood pressure checked: Every 3-5 years if you are 18-39 years of age. Every year if you are 40 years old or older. Diabetes Have regular diabetes screenings. This checks your fasting blood sugar level. Have the screening done: Once every three years after age 40 if you are at a normal weight and have a low risk for diabetes. More often and at a younger age if you are overweight or have a high risk for diabetes. What should I know about preventing infection? Hepatitis B If you have a higher risk for hepatitis B, you should be screened for this virus. Talk with your health care provider to find out if you are at risk forhepatitis B infection. Hepatitis C Testing is recommended for: Everyone born from 1945 through 1965. Anyone with known risk factors for hepatitis C. Sexually transmitted infections (STIs) Get screened for STIs, including gonorrhea and chlamydia, if: You are sexually active and are younger than 85 years of age. You are older than 85 years of age and your health care provider tells you that you are at risk for this type of infection. Your sexual activity has changed since you were last screened, and you are at increased risk for chlamydia or gonorrhea. Ask your health care provider if you are at risk. Ask your health care provider about whether you are at high risk for HIV. Your health care provider may recommend a prescription medicine to help   prevent HIV infection. If you choose to take medicine to prevent HIV, you should first get tested for HIV. You should then be tested every 3 months for as long as you are taking the medicine. Pregnancy If you are about to stop having your period (premenopausal) and you may become pregnant, seek counseling before you get  pregnant. Take 400 to 800 micrograms (mcg) of folic acid every day if you become pregnant. Ask for birth control (contraception) if you want to prevent pregnancy. Osteoporosis and menopause Osteoporosis is a disease in which the bones lose minerals and strength with aging. This can result in bone fractures. If you are 65 years old or older, or if you are at risk for osteoporosis and fractures, ask your health care provider if you should: Be screened for bone loss. Take a calcium or vitamin D supplement to lower your risk of fractures. Be given hormone replacement therapy (HRT) to treat symptoms of menopause. Follow these instructions at home: Lifestyle Do not use any products that contain nicotine or tobacco, such as cigarettes, e-cigarettes, and chewing tobacco. If you need help quitting, ask your health care provider. Do not use street drugs. Do not share needles. Ask your health care provider for help if you need support or information about quitting drugs. Alcohol use Do not drink alcohol if: Your health care provider tells you not to drink. You are pregnant, may be pregnant, or are planning to become pregnant. If you drink alcohol: Limit how much you use to 0-1 drink a day. Limit intake if you are breastfeeding. Be aware of how much alcohol is in your drink. In the [place] one drink equals one 12 oz bottle of beer (355 mL), one 5 oz glass of wine (148 mL), or one 1 oz glass of hard liquor (44 mL). General instructions Schedule regular health, dental, and eye exams. Stay current with your vaccines. Tell your health care provider if: You often feel depressed. You have ever been abused or do not feel safe at home. Summary Adopting a healthy lifestyle and getting preventive care are important in promoting health and wellness. Follow your health care provider's instructions about healthy diet, exercising, and getting tested or screened for diseases. Follow your health care provider's  instructions on monitoring your cholesterol and blood pressure. This information is not intended to replace advice given to you by your health care provider. Make sure you discuss any questions you have with your healthcare provider. Document Revised: 03/29/2018 Document Reviewed: 03/29/2018 Elsevier Patient Education  2022 [employer]  

## 2020-12-14 NOTE — Progress Notes (Addendum)
Subjective:   [name] is a 87 y.o. female who presents for Medicare Annual (Subsequent) preventive examination. I connected with  [name] on 12/15/20 by a audio enabled telemedicine application and verified that I am speaking with the correct person using two identifiers.   I discussed the limitations of evaluation and management by telemedicine. The patient expressed understanding and agreed to proceed.  Location of patient: Home Location of provider: Home   Review of Systems   defer to PCP        Objective:    There were no vitals filed for this visit. There is no height or weight on file to calculate BMI.  Advanced Directives 12/14/2020 08/21/2020 12/31/2019 12/27/2019 04/26/2017 08/20/2016 08/16/2016  Does Patient Have a Medical Advance Directive? Yes Yes Yes Yes Yes Yes Yes  Type of Advance Directive Living will;Out of facility DNR (pink MOST or yellow form);Healthcare Power of [place];Living will [place];Living will Living will;Healthcare Power of [place];Living will Healthcare Power of [place];Living will  Does patient want to make changes to medical advance directive? Yes (Inpatient - patient defers changing a medical advance directive and declines information at this time) - - No - Patient declined Yes (MAU/Ambulatory/Procedural Areas - Information given) No - Patient declined -  Copy of [place] in Chart? No - copy requested - Yes - validated most recent copy scanned in chart (See row information) No - copy requested - - -  Would patient like information on creating a medical advance directive? - - - No - Patient declined - - -    Current Medications (verified) Outpatient Encounter Medications as of 12/14/2020  Medication Sig   acetaminophen (TYLENOL) 500 MG tablet Take 500-1,000 mg by mouth every 6 (six) hours as needed (for headache/pain.).    ALPRAZolam (XANAX) 0.25 MG tablet TAKE 1 TABLET(0.25 MG) BY MOUTH TWICE DAILY AS NEEDED FOR ANXIETY   amLODipine (NORVASC) 5 MG tablet Take 1 tablet (5 mg total) by mouth daily.   apixaban (ELIQUIS) 5 MG TABS tablet TAKE 1 TABLET BY MOUTH TWICE DAILY. LABS NEEDED FOR FURTHER REFILLS   budesonide (RHINOCORT AQUA) 32 MCG/ACT nasal spray Place 1 spray into both nostrils as needed.    CALCIUM CARBONATE-VIT D-MIN PO Take 2 tablets by mouth 3 (three) times a week.    CRANBERRY PO Take 1 tablet by mouth daily.   diltiazem (CARDIZEM) 30 MG tablet Take 1 tablet every 4 hours AS NEEDED for AFIB heart rate >100   estradiol (ESTRACE) 0.1 MG/GM vaginal cream Place vaginally.   Glucosamine-Chondroitin (COSAMIN DS PO) Take 1 tablet by mouth 2 (two) times daily.   loratadine (CLARITIN) 10 MG tablet Take 10 mg by mouth daily as needed for allergies.    meclizine (ANTIVERT) 25 MG tablet Take 1 tablet (25 mg total) by mouth 3 (three) times daily as needed for up to 21 doses for dizziness or nausea.   metoprolol tartrate (LOPRESSOR) 25 MG tablet TAKE 1 TABLET BY MOUTH TWICE DAILY   nitrofurantoin, macrocrystal-monohydrate, (MACROBID) 100 MG capsule Take 100 mg by mouth daily.   ondansetron (ZOFRAN) 4 MG tablet Take 1 tablet (4 mg total) by mouth every 8 (eight) hours as needed for nausea or vomiting.   Polyethyl Glycol-Propyl Glycol (SYSTANE OP) Place 1 drop into both eyes 2 (two) times daily as needed (for dry eyes).    pravastatin (PRAVACHOL) 40 MG tablet  TAKE 1 TABLET(40 MG) BY MOUTH DAILY   Probiotic Product (PROBIOTIC DAILY) CAPS Take 2 capsules by mouth daily.   Psyllium (METAMUCIL FIBER PO) Take by mouth as needed.    Vitamins-Lipotropics (LIPOFLAVONOID PO) Take 1 tablet by mouth 2 (two) times daily as needed (for ringing in ears).   No facility-administered encounter medications on file as of 12/14/2020.    Allergies (verified) Lactose, Lisinopril, and Bactrim [sulfamethoxazole-trimethoprim]    History: Past Medical History:  Diagnosis Date   Arthritis    "hands, little in my feet" (06/22/2016)   Candidiasis of vulva and vagina    Cystocele, midline    Disorder of bone and cartilage, unspecified    Fibroids    Hypertension    Large hiatal hernia 06/23/2016   Migraine    "none in the last few years" (06/22/2016)   Other chronic cystitis    Other sign and symptom in breast    Paroxysmal atrial fibrillation ([place]) 11/30/2012   Pneumonia    "I've had walking pneumonia a couple times"  (06/22/2016)   Pneumonia dx'd 06/15/2016   Rectal incontinence    Stroke ([place]) 2014   hx of mini stroke    TIA (transient ischemic attack) 2015   Past Surgical History:  Procedure Laterality Date   CARDIOVERSION N/A 06/29/2016   Procedure: CARDIOVERSION;  Surgeon: Jerline Pain, MD;  Location: [facility];  Service: Cardiovascular;  Laterality: N/A;   CATARACT EXTRACTION W/ INTRAOCULAR LENS IMPLANT Left    DILATION AND CURETTAGE OF UTERUS     HYSTEROSCOPY WITH D & C  03/16/1999   INSERTION OF MESH N/A 08/20/2016   Procedure: INSERTION OF MESH;  Surgeon: Ralene Ok, MD;  Location: [facility];  Service: General;  Laterality: N/A;   TEE WITHOUT CARDIOVERSION N/A 11/06/2012   Procedure: TRANSESOPHAGEAL ECHOCARDIOGRAM (TEE);  Surgeon: Lelon Perla, MD;  Location: [facility] END[facility]COPY;  Service: Cardiovascular;  Laterality: N/A;   TONSILLECTOMY  1937   VAGINAL HYSTERECTOMY  09/2005   Anterior repair; Removal of urethral caruncle./notes 09/02/2015   Family History  Problem Relation Age of Onset   Hypertension Mother    Heart disease Father    Heart disease Brother    Emphysema Brother    Social History   Socioeconomic History   Marital status: Widowed    Spouse name: Not on file   Number of children: 2   Years of education: Not on file   Highest education level: Not on file  Occupational History   Not on file  Tobacco Use   Smoking status: Never   Smokeless tobacco: Never  Vaping Use   Vaping Use:  Never used  Substance and Sexual Activity   Alcohol use: Yes    Alcohol/week: 1.0 standard drink    Types: 1 Glasses of wine per week    Comment: one glass of wine 1-2 days per week    Drug use: No   Sexual activity: Not Currently  Other Topics Concern   Not on file  Social History Narrative   Not on file   Social Determinants of Health   Financial Resource Strain: Low Risk    Difficulty of Paying Living Expenses: Not hard at all  Food Insecurity: No Food Insecurity   Worried About Charity fundraiser in the Last Year: Never true   Ran Out of Food in the Last Year: Never true  Transportation Needs: No Transportation Needs   Lack of Transportation (Medical): No   Lack of Transportation (Non-Medical):  No  Physical Activity: Insufficiently Active   Days of Exercise per Week: 7 days   Minutes of Exercise per Session: 10 min  Stress: Stress Concern Present   Feeling of Stress : To some extent  Social Connections: Moderately Isolated   Frequency of Communication with Friends and Family: Three times a week   Frequency of Social Gatherings with Friends and Family: Three times a week   Attends Religious Services: More than 4 times per year   Active Member of Clubs or Organizations: No   Attends Archivist Meetings: Never   Marital Status: Widowed    Tobacco Counseling Counseling given: Not Answered   Clinical Intake:  Pre-visit preparation completed: Yes  Pain : No/denies pain     Diabetes: No  How often do you need to have someone help you when you read instructions, pamphlets, or other written materials from your doctor or pharmacy?: 1 - Never  Diabetic?no  Interpreter Needed?: No      Activities of Daily Living No flowsheet data found.  Patient Care Team: [name], MD as PCP - General (Family Medicine)  Indicate any recent Medical Services you may have received from other than [facility] providers in the past year (date may be approximate).      Assessment:   This is a routine wellness examination for [place].  Hearing/Vision screen No results found.  Dietary issues and exercise activities discussed:     Goals Addressed   None   Depression Screen PHQ 2/9 Scores 04/22/2020 02/05/2019 04/26/2017 01/15/2016 12/09/2014 10/02/2013  PHQ - 2 Score 0 6 0 0 0 0  PHQ- 9 Score - 16 - - - -    Fall Risk Fall Risk  12/14/2020 08/28/2020 02/05/2019 04/26/2017 01/15/2016  Falls in the past year? 0 1 1 No No  Number falls in past yr: - 1 0 - -  Injury with Fall? - 1 0 - -  Risk for fall due to : - Impaired vision;Impaired mobility;Impaired balance/gait - - -  Follow up - Falls evaluation completed - - -    FALL RISK PREVENTION PERTAINING TO THE HOME:  Any stairs in or around the home? Yes  If so, are there any without handrails? No  Home free of loose throw rugs in walkways, pet beds, electrical cords, etc? Yes  Adequate lighting in your home to reduce risk of falls? Yes   ASSISTIVE DEVICES UTILIZED TO PREVENT FALLS:  Life alert? Yes  Use of a cane, walker or w/c? No  Grab bars in the bathroom? Yes  Shower chair or bench in shower? Yes  Elevated toilet seat or a handicapped toilet? Yes   TIMED UP AND GO:  Was the test performed?  N/A .  Length of time to ambulate 10 feet: N/A sec.     Cognitive Function:     6CIT Screen 12/14/2020  What Year? 0 points  What month? 0 points  What time? 0 points  Count back from 20 0 points  Months in reverse 2 points  Repeat phrase 0 points  Total Score 2    Immunizations Immunization History  Administered Date(s) Administered   Fluad Quad(high Dose 65+) 01/09/2019   Influenza Split 12/18/2013   Influenza, High Dose Seasonal PF 01/24/2018   Influenza-Unspecified 01/12/2015, 01/13/2016, 01/20/2017, 01/24/2018   Moderna Sars-Covid-2 Vaccination 05/01/2019, 05/29/2019   Pneumococcal Conjugate-13 10/02/2013   Pneumococcal Polysaccharide-23 12/09/2014   Tdap 04/19/2004, 02/20/2016    Zoster Recombinat (Shingrix) 10/29/2016, 03/21/2017  Zoster, Live 04/19/2004    TDAP status: Up to date  Flu Vaccine status: Due, Education has been provided regarding the importance of this vaccine. Advised may receive this vaccine at local pharmacy or Health Dept. Aware to provide a copy of the vaccination record if obtained from local pharmacy or Health Dept. Verbalized acceptance and understanding.  Pneumococcal vaccine status: Up to date  Covid-19 vaccine status: Completed vaccines  Qualifies for Shingles Vaccine? Yes   Zostavax completed Yes   Shingrix Completed?: Yes  Screening Tests Health Maintenance  Topic Date Due   COVID-19 Vaccine (3 - Booster for Moderna series) 10/26/2019   INFLUENZA VACCINE  11/17/2020   TETANUS/TDAP  06/15/2026   DEXA SCAN  Completed   PNA vac Low Risk Adult  Completed   Zoster Vaccines- Shingrix  Completed   HPV VACCINES  Aged Out   MAMMOGRAM  Discontinued    Health Maintenance  Health Maintenance Due  Topic Date Due   COVID-19 Vaccine (3 - Booster for Moderna series) 10/26/2019   INFLUENZA VACCINE  11/17/2020    Colorectal cancer screening: No longer required.   Mammogram status: No longer required due to age.  Bone Density status: Completed 03/26/2016. Results reflect: Bone density results: OSTEOPENIA. Repeat every 2 years. Bone Density scan was reported by patient. No results scanned in Chart.   Lung Cancer Screening: (Low Dose CT Chest recommended if Age 79-80 years, 30 pack-year currently smoking OR have quit w/in 15years.) does qualify.   Lung Cancer Screening Referral: defer to pcp  Additional Screening:  Hepatitis C Screening: does not qualify;  Vision Screening: Recommended annual ophthalmology exams for early detection of glaucoma and other disorders of the eye. Is the patient up to date with their annual eye exam?  Yes  Who is the provider or what is the name of the office in which the patient attends annual eye exams?  Dr. Celene Squibb- comes to [facility]  If pt is not established with a provider, would they like to be referred to a provider to establish care?  N/A .   Dental Screening: Recommended annual dental exams for proper oral hygiene  Community Resource Referral / Chronic Care Management: CRR required this visit?  No   CCM required this visit?  No      Plan:     I have personally reviewed and noted the following in the patient's chart:   Medical and social history Use of alcohol, tobacco or illicit drugs  Current medications and supplements including opioid prescriptions.  Functional ability and status Nutritional status Physical activity Advanced directives List of other physicians Hospitalizations, surgeries, and ER visits in previous 12 months Vitals Screenings to include cognitive, depression, and falls Referrals and appointments  In addition, I have reviewed and discussed with patient certain preventive protocols, quality metrics, and best practice recommendations. A written personalized care plan for preventive services as well as general preventive health recommendations were provided to patient.     Nat Christen, CMA   12/15/2020   Nurse Notes: Non to face 40 minute visit.  Ms. Lor , Thank you for taking time to come for your Medicare Wellness Visit. I appreciate your ongoing commitment to your health goals. Please review the following plan we discussed and let me know if I can assist you in the future.   These are the goals we discussed:  Goals   None     This is a list of the screening recommended for you and due dates:  Health Maintenance  Topic Date Due   COVID-19 Vaccine (3 - Booster for Moderna series) 10/26/2019   Flu Shot  11/17/2020   Tetanus Vaccine  06/15/2026   DEXA scan (bone density measurement)  Completed   Pneumonia vaccines  Completed   Zoster (Shingles) Vaccine  Completed   HPV Vaccine  Aged Out   Mammogram  Discontinued

## 2020-12-31 ENCOUNTER — Other Ambulatory Visit: Payer: Self-pay

## 2020-12-31 MED ORDER — APIXABAN 5 MG PO TABS: ORAL_TABLET | ORAL | 1 refills | Status: DC

## 2020-12-31 NOTE — Telephone Encounter (Signed)
Prescription refill request for Eliquis received. Indication:afib Last office visit:jordan 12/11/20 Scr:0.86 08/21/20 Age: 52fWeight:66.3kg

## 2021-03-14 ENCOUNTER — Other Ambulatory Visit: Payer: Self-pay | Admitting: Family Medicine

## 2021-03-14 DIAGNOSIS — E785 Hyperlipidemia, unspecified: Secondary | ICD-10-CM

## 2021-04-01 ENCOUNTER — Other Ambulatory Visit: Payer: Self-pay

## 2021-04-01 ENCOUNTER — Encounter (HOSPITAL_COMMUNITY): Payer: Self-pay | Admitting: Physician Assistant

## 2021-04-01 ENCOUNTER — Ambulatory Visit (HOSPITAL_COMMUNITY)
Admission: RE | Admit: 2021-04-01 | Discharge: 2021-04-01 | Disposition: A | Discharge status: Home / Self Care | Payer: Medicare Other | Source: Ambulatory Visit | Attending: Physician Assistant | Admitting: Physician Assistant

## 2021-04-01 VITALS — BP 138/70 | HR 57 | Ht 66.0 in | Wt 145.8 lb

## 2021-04-01 DIAGNOSIS — D6869 Other thrombophilia: Secondary | ICD-10-CM | POA: Diagnosis not present

## 2021-04-01 DIAGNOSIS — Z7901 Long term (current) use of anticoagulants: Secondary | ICD-10-CM | POA: Insufficient documentation

## 2021-04-01 DIAGNOSIS — Z8673 Personal history of transient ischemic attack (TIA), and cerebral infarction without residual deficits: Secondary | ICD-10-CM | POA: Diagnosis not present

## 2021-04-01 DIAGNOSIS — Z79899 Other long term (current) drug therapy: Secondary | ICD-10-CM | POA: Diagnosis not present

## 2021-04-01 DIAGNOSIS — I480 Paroxysmal atrial fibrillation: Secondary | ICD-10-CM | POA: Insufficient documentation

## 2021-04-01 DIAGNOSIS — I10 Essential (primary) hypertension: Secondary | ICD-10-CM | POA: Diagnosis not present

## 2021-04-01 NOTE — Progress Notes (Signed)
Primary Care Physician: [name], MD Referring Physician: NL  Cardiologist: Dr. [name]    [name] is a 87 y.o. female with a h/o paroxysmal afib, prior TIA, HTN who presents for follow up in the [facility]. She is on apixaban for a CHA2DS2VASc score of 6. Patient reports that overall her afib has been well controlled. She will occasionally take a PRN diltiazem which quickly resolves her palpitations.  On 08/21/20 she had an episode of dizziness and almost fell. She did not lose consciousness. She went to the ED and evaluation at that time was unremarkable. She was in SR. Vertigo was suspected and she was started on meclizine. However, since then she has still been lightheaded and just "not feeling normal." She saw her PCP and there was concern that bradycardia could be contributing to her symptoms.   On follow up today, patient reports that she has done well since her last visit. She had several months with no symptoms of afib. She did have some palpitations this past week, no specific triggers that she could identify. She does admit she becomes more stressed around the holidays. She is in SR today. No bleeding issues on anticoagulation.   Today, she denies symptoms of chest pain, shortness of breath, orthopnea, PND, lower extremity edema, presyncope, syncope, or neurologic sequela. The patient is tolerating medications without difficulties and is otherwise without complaint today.   Past Medical History:  Diagnosis Date   Arthritis    "hands, little in my feet" (06/22/2016)   Candidiasis of vulva and vagina    Cystocele, midline    Disorder of bone and cartilage, unspecified    Fibroids    Hypertension    Large hiatal hernia 06/23/2016   Migraine    "none in the last few years" (06/22/2016)   Other chronic cystitis    Other sign and symptom in breast    Paroxysmal atrial fibrillation ([place]) 11/30/2012   Pneumonia    "I've had walking pneumonia a couple  times"  (06/22/2016)   Pneumonia dx'd 06/15/2016   Rectal incontinence    Stroke ([place]) 2014   hx of mini stroke    TIA (transient ischemic attack) 2015   Past Surgical History:  Procedure Laterality Date   CARDIOVERSION N/A 06/29/2016   Procedure: CARDIOVERSION;  Surgeon: Jerline Pain, MD;  Location: [facility];  Service: Cardiovascular;  Laterality: N/A;   CATARACT EXTRACTION W/ INTRAOCULAR LENS IMPLANT Left    DILATION AND CURETTAGE OF UTERUS     HYSTEROSCOPY WITH D & C  03/16/1999   INSERTION OF MESH N/A 08/20/2016   Procedure: INSERTION OF MESH;  Surgeon: Ralene Ok, MD;  Location[facility];  Service: General;  Laterality: N/A;   TEE WITHOUT CARDIOVERSION N/A 11/06/2012   Procedure: TRANSESOPHAGEAL ECHOCARDIOGRAM (TEE);  Surgeon: Lelon Perla, MD;  Location: [facility] ENDOSCOPY;  Service: Cardiovascular;  Laterality: N/A;   TONSILLECTOMY  1937   VAGINAL HYSTERECTOMY  09/2005   Anterior repair; Removal of urethral caruncle./notes 09/02/2015    Current Outpatient Medications  Medication Sig Dispense Refill   acetaminophen (TYLENOL) 500 MG tablet Take 500-1,000 mg by mouth every 6 (six) hours as needed (for headache/pain.).     ALPRAZolam (XANAX) 0.25 MG tablet TAKE 1 TABLET(0.25 MG) BY MOUTH TWICE DAILY AS NEEDED FOR ANXIETY 30 tablet 0   amLODipine (NORVASC) 5 MG tablet Take 1 tablet (5 mg total) by mouth daily. 90 tablet 3   apixaban (ELIQUIS) 5 MG  TABS tablet TAKE 1 TABLET BY MOUTH TWICE DAILY. 180 tablet 1   budesonide (RHINOCORT AQUA) 32 MCG/ACT nasal spray Place 1 spray into both nostrils as needed.      CALCIUM CARBONATE-VIT D-MIN PO Take 2 tablets by mouth 3 (three) times a week.      CRANBERRY PO Take 1 tablet by mouth daily.     diltiazem (CARDIZEM) 30 MG tablet Take 1 tablet every 4 hours AS NEEDED for AFIB heart rate >100 45 tablet 1   estradiol (ESTRACE) 0.1 MG/GM vaginal cream Place vaginally.     Glucosamine-Chondroitin (COSAMIN DS PO) Take 1 tablet by mouth 2 (two) times  daily.     loratadine (CLARITIN) 10 MG tablet Take 10 mg by mouth daily as needed for allergies.      meclizine (ANTIVERT) 25 MG tablet Take 1 tablet (25 mg total) by mouth 3 (three) times daily as needed for up to 21 doses for dizziness or nausea. 21 tablet 0   metoprolol tartrate (LOPRESSOR) 25 MG tablet TAKE 1 TABLET BY MOUTH TWICE DAILY 180 tablet 3   nitrofurantoin, macrocrystal-monohydrate, (MACROBID) 100 MG capsule Take 100 mg by mouth daily.     ondansetron (ZOFRAN) 4 MG tablet Take 1 tablet (4 mg total) by mouth every 8 (eight) hours as needed for nausea or vomiting. 20 tablet 0   Polyethyl Glycol-Propyl Glycol (SYSTANE OP) Place 1 drop into both eyes 2 (two) times daily as needed (for dry eyes).      pravastatin (PRAVACHOL) 40 MG tablet TAKE 1 TABLET(40 MG) BY MOUTH DAILY 90 tablet 1   Probiotic Product (PROBIOTIC DAILY) CAPS Take 2 capsules by mouth daily.     Psyllium (METAMUCIL FIBER PO) Take by mouth as needed.      Vitamins-Lipotropics (LIPOFLAVONOID PO) Take 1 tablet by mouth 2 (two) times daily as needed (for ringing in ears).     No current facility-administered medications for this encounter.    Allergies  Allergen Reactions   Lactose Other (See Comments)    Dairy Sensitivity  Dairy Sensitivity     Lisinopril Other (See Comments)    DIZZY   Bactrim [Sulfamethoxazole-Trimethoprim] Rash    Social History   Socioeconomic History   Marital status: Widowed    Spouse name: Not on file   Number of children: 2   Years of education: Not on file   Highest education level: Not on file  Occupational History   Not on file  Tobacco Use   Smoking status: Never   Smokeless tobacco: Never  Vaping Use   Vaping Use: Never used  Substance and Sexual Activity   Alcohol use: Yes    Alcohol/week: 1.0 standard drink    Types: 1 Glasses of wine per week    Comment: one glass of wine 1-2 days per week    Drug use: No   Sexual activity: Not Currently  Other Topics Concern    Not on file  Social History Narrative   Not on file   Social Determinants of Health   Financial Resource Strain: Low Risk    Difficulty of Paying Living Expenses: Not hard at all  Food Insecurity: No Food Insecurity   Worried About Charity fundraiser in the Last Year: Never true   Ran Out of Food in the Last Year: Never true  Transportation Needs: No Transportation Needs   Lack of Transportation (Medical): No   Lack of Transportation (Non-Medical): No  Physical Activity: Insufficiently Active   Days  of Exercise per Week: 7 days   Minutes of Exercise per Session: 10 min  Stress: Stress Concern Present   Feeling of Stress : To some extent  Social Connections: Moderately Isolated   Frequency of Communication with Friends and Family: Three times a week   Frequency of Social Gatherings with Friends and Family: Three times a week   Attends Religious Services: More than 4 times per year   Active Member of Clubs or Organizations: No   Attends Archivist Meetings: Never   Marital Status: Widowed  Human resources officer Violence: Not At Risk   Fear of Current or Ex-Partner: No   Emotionally Abused: No   Physically Abused: No   Sexually Abused: No    Family History  Problem Relation Age of Onset   Hypertension Mother    Heart disease Father    Heart disease Brother    Emphysema Brother     ROS- All systems are reviewed and negative except as per the HPI above  Physical Exam: Vitals:   04/01/21 1117  BP: 138/70  Pulse: (!) 57  Weight: 66.1 kg  Height: 5\' 6"  (1.676 m)    Wt Readings from Last 3 Encounters:  04/01/21 66.1 kg  12/11/20 66.3 kg  10/02/20 63.7 kg    Labs: Lab Results  Component Value Date   NA 140 08/21/2020   K 3.5 08/21/2020   CL 103 08/21/2020   CO2 28 08/21/2020   GLUCOSE 114 (H) 08/21/2020   BUN 23 08/21/2020   CREATININE 0.86 08/21/2020   CALCIUM 9.7 08/21/2020   MG 2.1 06/30/2016   Lab Results  Component Value Date   INR 1.5 (H)  12/27/2019   Lab Results  Component Value Date   CHOL 155 04/24/2018   HDL 50 04/24/2018   LDLCALC 71 04/24/2018   TRIG 171 (H) 04/24/2018    GEN- The patient is a well appearing elderly female, alert and oriented x 3 today.   HEENT-head normocephalic, atraumatic, sclera clear, conjunctiva pink, hearing intact, trachea midline. Lungs- Clear to ausculation bilaterally, normal work of breathing Heart- Regular rate and rhythm, no murmurs, rubs or gallops  GI- soft, NT, ND, + BS Extremities- no clubbing, cyanosis, or edema MS- no significant deformity or atrophy Skin- no rash or lesion Psych- euthymic mood, full affect Neuro- strength and sensation are intact   EKG- SB, 1st degree AV block Vent. rate 57 BPM PR interval 212 ms QRS duration 78 ms QT/QTcB 462/449 ms   Assessment and Plan: 1. Paroxysmal atrial fibrillation  Patient in SR today, has had more palpitations recently.  We discussed rhythm control options today (dofetilide, amio) vs watchful waiting. She does not feel her symptoms warrant a change in medication at this time.  Continue Eliquis 5 mg BID Continue diltiazem 30 mg PRN for heart racing. Continue Lopressor 25 mg BID  2. CHA2DS2VASc score of at least 6 Continue apixaban 5 mg bid   3. HTN Stable, no changes today.    Follow up in the AF clinic in 6 months.    [facility] [address] [place], [place] [address] [phone]

## 2021-06-11 ENCOUNTER — Other Ambulatory Visit: Payer: Self-pay | Admitting: Cardiology

## 2021-06-11 NOTE — Telephone Encounter (Signed)
Prescription refill request for Eliquis received. Indication:Afib Last office visit:12/22 Scr:0.8 Age: 88 Weight:66.1 kg  Prescription refilled

## 2021-06-25 NOTE — Progress Notes (Unsigned)
[name] Date of Birth: 1930-11-28 Medical Record #[id number]  History of Present Illness: [name] is seen for followup for history of TIA and paroxysmal atrial fibrillation.  She is on anticoagulation with Eliquis.  She is on metoprolol and cardizem for rate control and prior event monitor showed good control when she is in Afib.  She  was admitted in March 2018 with nausea vomiting and the chest pain. Initial EKG showed normal sinus rhythm, CT of abdomen and pelvis showed large intrathoracic gastric hernia, no wall thickening all pneumatosis. Finding most likely representing spontaneous interval reduction of gastric volvulus. KUB was unremarkable. Ultrasound of abdomen does not show any gallstones. During the hospitalization, she developed worsening shortness of breath after aggressive IV hydration. She was treated with IV Lasix afterward with good urinary output. She was also found to be hypothyroid and received thyroid replacement. Her hospital stay was complicated by atrial fibrillation with RVR. She was  on eliquis. Troponin was elevated and a BNP was 836. Cardiology was consulted for atrial fibrillation and elevated troponin. Elevated troponin was felt to be related to demand ischemia with heart failure and the pneumonia. Echocardiogram obtained on 06/28/2016 showed EF 44-01%, grade 1 diastolic dysfunction. She required cardioversion on 06/29/2016 but was unable to maintain NSR.   On 08/21/16 she was readmitted and underwent Nissen fundoplication without complication.   Last summer she was seen in the Afib clinic with Afib. She was treated with long acting Cardizem as well as 30 mg po prn. She states this has helped. She is seen today with her son who is an anesthesiologist in [place]. She still has Afib. Not really sustained. Has a Cadionet monitor but doesn't like to use it. Has had recurrent Ecoli UTIs. Planning to see urology. She is at independent living at [facility]    Her husband  did pass away this year and it has been a tough adjustment for her.   She has been followed in the Afib clinic. Last seen in  Dec and was in NSR.  Minimally symptomatic. Continued on lopressor and Eliquis with prn diltiazem.   She is seen with her Daughter in law today. She thinks she has Afib about once a week. Generally just rests and it resolves. Does feel tired afterwards. Rarely has taken extra cardizem. She does do a lot of walking.   Current Outpatient Medications on File Prior to Visit  Medication Sig Dispense Refill   acetaminophen (TYLENOL) 500 MG tablet Take 500-1,000 mg by mouth every 6 (six) hours as needed (for headache/pain.).     ALPRAZolam (XANAX) 0.25 MG tablet TAKE 1 TABLET(0.25 MG) BY MOUTH TWICE DAILY AS NEEDED FOR ANXIETY 30 tablet 0   amLODipine (NORVASC) 5 MG tablet Take 1 tablet (5 mg total) by mouth daily. 90 tablet 3   budesonide (RHINOCORT AQUA) 32 MCG/ACT nasal spray Place 1 spray into both nostrils as needed.      CALCIUM CARBONATE-VIT D-MIN PO Take 2 tablets by mouth 3 (three) times a week.      CRANBERRY PO Take 1 tablet by mouth daily.     diltiazem (CARDIZEM) 30 MG tablet Take 1 tablet every 4 hours AS NEEDED for AFIB heart rate >100 45 tablet 1   ELIQUIS 5 MG TABS tablet TAKE 1 TABLET BY MOUTH TWICE DAILY 180 tablet 1   estradiol (ESTRACE) 0.1 MG/GM vaginal cream Place vaginally.     Glucosamine-Chondroitin (COSAMIN DS PO) Take 1 tablet by mouth 2 (two) times daily.  loratadine (CLARITIN) 10 MG tablet Take 10 mg by mouth daily as needed for allergies.      meclizine (ANTIVERT) 25 MG tablet Take 1 tablet (25 mg total) by mouth 3 (three) times daily as needed for up to 21 doses for dizziness or nausea. 21 tablet 0   metoprolol tartrate (LOPRESSOR) 25 MG tablet TAKE 1 TABLET BY MOUTH TWICE DAILY 180 tablet 3   nitrofurantoin, macrocrystal-monohydrate, (MACROBID) 100 MG capsule Take 100 mg by mouth daily.     ondansetron (ZOFRAN) 4 MG tablet Take 1 tablet (4 mg  total) by mouth every 8 (eight) hours as needed for nausea or vomiting. 20 tablet 0   Polyethyl Glycol-Propyl Glycol (SYSTANE OP) Place 1 drop into both eyes 2 (two) times daily as needed (for dry eyes).      pravastatin (PRAVACHOL) 40 MG tablet TAKE 1 TABLET(40 MG) BY MOUTH DAILY 90 tablet 1   Probiotic Product (PROBIOTIC DAILY) CAPS Take 2 capsules by mouth daily.     Psyllium (METAMUCIL FIBER PO) Take by mouth as needed.      Vitamins-Lipotropics (LIPOFLAVONOID PO) Take 1 tablet by mouth 2 (two) times daily as needed (for ringing in ears).     No current facility-administered medications on file prior to visit.    Allergies  Allergen Reactions   Lactose Other (See Comments)    Dairy Sensitivity  Dairy Sensitivity     Lisinopril Other (See Comments)    DIZZY   Bactrim [Sulfamethoxazole-Trimethoprim] Rash    Past Medical History:  Diagnosis Date   Arthritis    "hands, little in my feet" (06/22/2016)   Candidiasis of vulva and vagina    Cystocele, midline    Disorder of bone and cartilage, unspecified    Fibroids    Hypertension    Large hiatal hernia 06/23/2016   Migraine    "none in the last few years" (06/22/2016)   Other chronic cystitis    Other sign and symptom in breast    Paroxysmal atrial fibrillation ([place]) 11/30/2012   Pneumonia    "I've had walking pneumonia a couple times"  (06/22/2016)   Pneumonia dx'd 06/15/2016   Rectal incontinence    Stroke ([place]) 2014   hx of mini stroke    TIA (transient ischemic attack) 2015    Past Surgical History:  Procedure Laterality Date   CARDIOVERSION N/A 06/29/2016   Procedure: CARDIOVERSION;  Surgeon: Jerline Pain, MD;  Location: [facility];  Service: Cardiovascular;  Laterality: N/A;   CATARACT EXTRACTION W/ INTRAOCULAR LENS IMPLANT Left    DILATION AND CURETTAGE OF UTERUS     HYSTEROSCOPY WITH D & C  03/16/1999   INSERTION OF MESH N/A 08/20/2016   Procedure: INSERTION OF MESH;  Surgeon: Ralene Ok, MD;  Location: [facility];   Service: General;  Laterality: N/A;   TEE WITHOUT CARDIOVERSION N/A 11/06/2012   Procedure: TRANSESOPHAGEAL ECHOCARDIOGRAM (TEE);  Surgeon: Lelon Perla, MD;  Location: [facility] ENDOSCOPY;  Service: Cardiovascular;  Laterality: N/A;   TONSILLECTOMY  1937   VAGINAL HYSTERECTOMY  09/2005   Anterior repair; Removal of urethral caruncle./notes 09/02/2015    Social History   Tobacco Use  Smoking Status Never  Smokeless Tobacco Never    Social History   Substance and Sexual Activity  Alcohol Use Yes   Alcohol/week: 1.0 standard drink   Types: 1 Glasses of wine per week   Comment: one glass of wine 1-2 days per week     Family History  Problem Relation  Age of Onset   Hypertension Mother    Heart disease Father    Heart disease Brother    Emphysema Brother     Review of Systems: As noted in history of present illness. All other systems were reviewed and are negative.  Physical Exam: There were no vitals taken for this visit. GENERAL:  Well appearing WF in NAD HEENT:  PERRL, EOMI, sclera are clear. Oropharynx is clear. NECK:  No jugular venous distention, carotid upstroke brisk and symmetric, no bruits, no thyromegaly or adenopathy LUNGS:  Clear to auscultation bilaterally CHEST:  Unremarkable HEART:  RRR,  PMI not displaced or sustained,S1 and S2 within normal limits, no S3, no S4: no clicks, no rubs, no murmurs ABD:  Soft, nontender. BS +, no masses or bruits. No hepatomegaly, no splenomegaly EXT:  2 + pulses throughout, no edema, no cyanosis no clubbing SKIN:  Warm and dry.  No rashes NEURO:  Alert and oriented x 3. Cranial nerves II through XII intact. PSYCH:  Cognitively intact      LABORATORY DATA: Lab Results  Component Value Date   WBC 6.8 08/21/2020   HGB 13.8 08/21/2020   HCT 42.9 08/21/2020   PLT 268 08/21/2020   GLUCOSE 114 (H) 08/21/2020   CHOL 155 04/24/2018   TRIG 171 (H) 04/24/2018   HDL 50 04/24/2018   LDLCALC 71 04/24/2018   ALT 24 12/27/2019    AST 58 (H) 12/27/2019   NA 140 08/21/2020   K 3.5 08/21/2020   CL 103 08/21/2020   CREATININE 0.86 08/21/2020   BUN 23 08/21/2020   CO2 28 08/21/2020   TSH 2.350 02/05/2019   INR 1.5 (H) 12/27/2019   HGBA1C 5.9 (A) 12/04/2019    Echo 07/04/16: Study Conclusions   - Left ventricle: The cavity size was normal. Wall thickness was   increased in a pattern of mild LVH. Systolic function was normal.   The estimated ejection fraction was in the range of 50% to 55%.   Wall motion was normal; there were no regional wall motion   abnormalities. Doppler parameters are consistent with abnormal   left ventricular relaxation (grade 1 diastolic dysfunction). - Mitral valve: Moderately calcified annulus. There was moderate   regurgitation. - Left atrium: The atrium was mildly dilated. - Right atrium: The atrium was mildly dilated.   Assessment / Plan: 1. TIA. On long-term anticoagulation with Eliquis. Tolerating well.   2. Paroxysmal atrial fibrillation.  Continue rate control with metoprolol. She seems to have learned to live with her AFib.  If she were to have more sustained or symptomatic Afib would consider AAD therapy. When seen in Afib clinic in Dec was maintaining NSR  3. Hypertension- BP well controlled.  4. Hyperlipidemia-on pravastatin with good results.  5. S/p Nissen fundoplication.   I will follow up in 6 months.

## 2021-06-30 ENCOUNTER — Ambulatory Visit (INDEPENDENT_AMBULATORY_CARE_PROVIDER_SITE_OTHER): Payer: Medicare Other | Admitting: Cardiology

## 2021-06-30 ENCOUNTER — Other Ambulatory Visit: Payer: Self-pay

## 2021-06-30 ENCOUNTER — Encounter: Payer: Self-pay | Admitting: Cardiology

## 2021-06-30 VITALS — BP 140/68 | HR 73 | Ht 66.0 in | Wt 148.0 lb

## 2021-06-30 DIAGNOSIS — E785 Hyperlipidemia, unspecified: Secondary | ICD-10-CM

## 2021-06-30 DIAGNOSIS — I480 Paroxysmal atrial fibrillation: Secondary | ICD-10-CM | POA: Diagnosis not present

## 2021-06-30 DIAGNOSIS — I10 Essential (primary) hypertension: Secondary | ICD-10-CM

## 2021-09-12 ENCOUNTER — Other Ambulatory Visit: Payer: Self-pay | Admitting: Family Medicine

## 2021-09-12 DIAGNOSIS — E785 Hyperlipidemia, unspecified: Secondary | ICD-10-CM

## 2021-09-15 ENCOUNTER — Telehealth: Payer: Self-pay

## 2021-09-15 ENCOUNTER — Other Ambulatory Visit: Payer: Self-pay | Admitting: Family Medicine

## 2021-09-15 DIAGNOSIS — E785 Hyperlipidemia, unspecified: Secondary | ICD-10-CM

## 2021-09-15 NOTE — Telephone Encounter (Signed)
Pt advised she will now being seeing the doctor at [facility]. She thanked [place] for all we have done for through the years. [place]

## 2021-10-01 ENCOUNTER — Encounter (HOSPITAL_COMMUNITY): Payer: Self-pay | Admitting: Physician Assistant

## 2021-10-01 ENCOUNTER — Ambulatory Visit (HOSPITAL_COMMUNITY)
Admission: RE | Admit: 2021-10-01 | Discharge: 2021-10-01 | Disposition: A | Discharge status: Home / Self Care | Payer: Medicare Other | Source: Ambulatory Visit | Attending: Physician Assistant | Admitting: Physician Assistant

## 2021-10-01 VITALS — BP 134/74 | HR 55 | Ht 66.0 in | Wt 147.6 lb

## 2021-10-01 DIAGNOSIS — Z79899 Other long term (current) drug therapy: Secondary | ICD-10-CM | POA: Insufficient documentation

## 2021-10-01 DIAGNOSIS — D6869 Other thrombophilia: Secondary | ICD-10-CM

## 2021-10-01 DIAGNOSIS — I480 Paroxysmal atrial fibrillation: Secondary | ICD-10-CM | POA: Insufficient documentation

## 2021-10-01 DIAGNOSIS — I10 Essential (primary) hypertension: Secondary | ICD-10-CM | POA: Insufficient documentation

## 2021-10-01 DIAGNOSIS — Z8673 Personal history of transient ischemic attack (TIA), and cerebral infarction without residual deficits: Secondary | ICD-10-CM | POA: Diagnosis not present

## 2021-10-01 DIAGNOSIS — Z7901 Long term (current) use of anticoagulants: Secondary | ICD-10-CM | POA: Insufficient documentation

## 2021-10-01 LAB — CBC
HCT: 41.8 % (ref 36.0–46.0)
Hemoglobin: 13.2 g/dL (ref 12.0–15.0)
MCH: 29.7 pg (ref 26.0–34.0)
MCHC: 31.6 g/dL (ref 30.0–36.0)
MCV: 93.9 fL (ref 80.0–100.0)
Platelets: 272 10*3/uL (ref 150–400)
RBC: 4.45 MIL/uL (ref 3.87–5.11)
RDW: 13.5 % (ref 11.5–15.5)
WBC: 7.1 10*3/uL (ref 4.0–10.5)
nRBC: 0 % (ref 0.0–0.2)

## 2021-10-01 LAB — BASIC METABOLIC PANEL
Anion gap: 9 (ref 5–15)
BUN: 18 mg/dL (ref 8–23)
CO2: 24 mmol/L (ref 22–32)
Calcium: 9.5 mg/dL (ref 8.9–10.3)
Chloride: 108 mmol/L (ref 98–111)
Creatinine, Ser: 0.81 mg/dL (ref 0.44–1.00)
GFR, Estimated: >60 mL/min (ref >60)
Glucose, Bld: 104 mg/dL — ABNORMAL HIGH (ref 70–99)
Potassium: 4.2 mmol/L (ref 3.5–5.1)
Sodium: 141 mmol/L (ref 135–145)

## 2021-10-01 NOTE — Progress Notes (Signed)
Primary Care Physician: [name], MD Referring Physician: NL  Cardiologist: Dr. [name]    [name] is a 88 y.o. female with a h/o paroxysmal afib, prior TIA, HTN who presents for follow up in the [facility]. She is on apixaban for a CHA2DS2VASc score of 6. Patient reports that overall her afib has been well controlled. She will occasionally take a PRN diltiazem which quickly resolves her palpitations.  On 08/21/20 she had an episode of dizziness and almost fell. She did not lose consciousness. She went to the ED and evaluation at that time was unremarkable. She was in SR. Vertigo was suspected and she was started on meclizine.   On follow up today, patient reports that she has done well since her last visit. She has had only rare palpitations. No bleeding issues on anticoagulation. She does have frequent diarrhea, workup for this is ongoing.   Today, she denies symptoms of palpitations, chest pain, shortness of breath, orthopnea, PND, lower extremity edema, presyncope, syncope, or neurologic sequela. The patient is tolerating medications without difficulties and is otherwise without complaint today.   Past Medical History:  Diagnosis Date   Allergic rhinitis    Arthritis    "hands, little in my feet" (06/22/2016)   Candidiasis of vulva and vagina    Cystocele, midline    Disorder of bone and cartilage, unspecified    Fibroids    Hypertension    Large hiatal hernia 06/23/2016   Migraine    "none in the last few years" (06/22/2016)   Osteopenia    Other chronic cystitis    Other sign and symptom in breast    Paroxysmal atrial fibrillation ([place]) 11/30/2012   Pneumonia    "I've had walking pneumonia a couple times"  (06/22/2016)   Pneumonia dx'd 06/15/2016   Rectal incontinence    Stroke ([place]) 2014   hx of mini stroke    TIA (transient ischemic attack) 2015   Past Surgical History:  Procedure Laterality Date   CARDIOVERSION N/A 06/29/2016    Procedure: CARDIOVERSION;  Surgeon: Jerline Pain, MD;  Location: [facility];  Service: Cardiovascular;  Laterality: N/A;   CATARACT EXTRACTION W/ INTRAOCULAR LENS IMPLANT Left    DILATION AND CURETTAGE OF UTERUS     hiatel hernia  08/20/2016   HYSTEROSCOPY WITH D & C  03/16/1999   INSERTION OF MESH N/A 08/20/2016   Procedure: INSERTION OF MESH;  Surgeon: Ralene Ok, MD;  Location: [facility];  Service: General;  Laterality: N/A;   TEE WITHOUT CARDIOVERSION N/A 11/06/2012   Procedure: TRANSESOPHAGEAL ECHOCARDIOGRAM (TEE);  Surgeon: Lelon Perla, MD;  Location: [facility] ENDOSCOPY;  Service: Cardiovascular;  Laterality: N/A;   TONSILLECTOMY  1937   VAGINAL HYSTERECTOMY  09/2005   Anterior repair; Removal of urethral caruncle./notes 09/02/2015    Current Outpatient Medications  Medication Sig Dispense Refill   acetaminophen (TYLENOL) 500 MG tablet Take 500-1,000 mg by mouth every 6 (six) hours as needed (for headache/pain.).     ALPRAZolam (XANAX) 0.25 MG tablet TAKE 1 TABLET(0.25 MG) BY MOUTH TWICE DAILY AS NEEDED FOR ANXIETY 30 tablet 0   amLODipine (NORVASC) 5 MG tablet Take 1 tablet (5 mg total) by mouth daily. 90 tablet 3   budesonide (RHINOCORT AQUA) 32 MCG/ACT nasal spray Place 1 spray into both nostrils as needed.      CALCIUM CARBONATE-VIT D-MIN PO Take 2 tablets by mouth 3 (three) times a week.      CRANBERRY  PO Take 1 tablet by mouth daily.     diltiazem (CARDIZEM) 30 MG tablet Take 1 tablet every 4 hours AS NEEDED for AFIB heart rate >100 45 tablet 1   ELIQUIS 5 MG TABS tablet TAKE 1 TABLET BY MOUTH TWICE DAILY 180 tablet 1   estradiol (ESTRACE) 0.1 MG/GM vaginal cream Place vaginally.     Eyelid Cleansers (OCUSOFT EYELID CLEANSING EX) Apply topically.     Glucosamine-Chondroitin (COSAMIN DS PO) Take 1 tablet by mouth 2 (two) times daily.     lactase (LACTAID) 3000 units tablet Take by mouth as needed.     loperamide (IMODIUM A-D) 2 MG tablet Take 2 mg by mouth as needed for diarrhea  or loose stools.     loratadine (CLARITIN) 10 MG tablet Take 10 mg by mouth daily as needed for allergies.      meclizine (ANTIVERT) 25 MG tablet Take 1 tablet (25 mg total) by mouth 3 (three) times daily as needed for up to 21 doses for dizziness or nausea. 21 tablet 0   metoprolol tartrate (LOPRESSOR) 25 MG tablet TAKE 1 TABLET BY MOUTH TWICE DAILY 180 tablet 3   nitrofurantoin, macrocrystal-monohydrate, (MACROBID) 100 MG capsule Take 100 mg by mouth daily.     ondansetron (ZOFRAN) 4 MG tablet Take 1 tablet (4 mg total) by mouth every 8 (eight) hours as needed for nausea or vomiting. 20 tablet 0   Polyethyl Glycol-Propyl Glycol (SYSTANE OP) Place 1 drop into both eyes 2 (two) times daily as needed (for dry eyes).      pravastatin (PRAVACHOL) 40 MG tablet TAKE 1 TABLET(40 MG) BY MOUTH DAILY 30 tablet 0   Probiotic Product (PROBIOTIC DAILY) CAPS Take 2 capsules by mouth daily.     Psyllium (METAMUCIL FIBER PO) Take by mouth as needed.      Vitamins-Lipotropics (LIPOFLAVONOID PO) Take 1 tablet by mouth 2 (two) times daily as needed (for ringing in ears).     No current facility-administered medications for this encounter.    Allergies  Allergen Reactions   Lactose Other (See Comments)    Dairy Sensitivity  Dairy Sensitivity     Lisinopril Other (See Comments)    DIZZY   Bactrim [Sulfamethoxazole-Trimethoprim] Rash    Social History   Socioeconomic History   Marital status: Widowed    Spouse name: Not on file   Number of children: 2   Years of education: Not on file   Highest education level: Not on file  Occupational History   Not on file  Tobacco Use   Smoking status: Never   Smokeless tobacco: Never   Tobacco comments:    Never smoke 10/01/21  Vaping Use   Vaping Use: Never used  Substance and Sexual Activity   Alcohol use: Yes    Alcohol/week: 1.0 standard drink of alcohol    Types: 1 Glasses of wine per week    Comment: one glass of wine once a month 10/01/21   Drug  use: No   Sexual activity: Not Currently  Other Topics Concern   Not on file  Social History Narrative   Patient is an independent resident at [facility] and lives by herself-no pets.   Past profession was a Agricultural engineer, Network engineer, and Writer.   Exercise by walking everyday.    Patient has a living will. POA and DNR.    Social Determinants of Health   Financial Resource Strain: Low Risk  (12/14/2020)   Overall Financial Resource Strain (CARDIA)    Difficulty  of Paying Living Expenses: Not hard at all  Food Insecurity: No Food Insecurity (12/14/2020)   Hunger Vital Sign    Worried About Running Out of Food in the Last Year: Never true    Ran Out of Food in the Last Year: Never true  Transportation Needs: No Transportation Needs (12/14/2020)   PRAPARE - Hydrologist (Medical): No    Lack of Transportation (Non-Medical): No  Physical Activity: Insufficiently Active (12/14/2020)   Exercise Vital Sign    Days of Exercise per Week: 7 days    Minutes of Exercise per Session: 10 min  Stress: Stress Concern Present (12/14/2020)   [place]    Feeling of Stress : To some extent  Social Connections: Moderately Isolated (12/14/2020)   Social Connection and Isolation Panel [NHANES]    Frequency of Communication with Friends and Family: Three times a week    Frequency of Social Gatherings with Friends and Family: Three times a week    Attends Religious Services: More than 4 times per year    Active Member of Clubs or Organizations: No    Attends Archivist Meetings: Never    Marital Status: Widowed  Intimate Partner Violence: Not At Risk (12/14/2020)   Humiliation, Afraid, Rape, and Kick questionnaire    Fear of Current or Ex-Partner: No    Emotionally Abused: No    Physically Abused: No    Sexually Abused: No    Family History  Problem Relation Age of Onset   Cancer Mother     Hypertension Mother    Heart disease Father    Heart disease Brother    Emphysema Brother     ROS- All systems are reviewed and negative except as per the HPI above  Physical Exam: Vitals:   10/01/21 1130  BP: 134/74  Pulse: (!) 55  Weight: 67 kg  Height: '5\' 6"'$  (1.676 m)    Wt Readings from Last 3 Encounters:  10/01/21 67 kg  06/30/21 67.1 kg  04/01/21 66.1 kg    Labs: Lab Results  Component Value Date   NA 140 08/21/2020   K 3.5 08/21/2020   CL 103 08/21/2020   CO2 28 08/21/2020   GLUCOSE 114 (H) 08/21/2020   BUN 23 08/21/2020   CREATININE 0.86 08/21/2020   CALCIUM 9.7 08/21/2020   MG 2.1 06/30/2016   Lab Results  Component Value Date   INR 1.5 (H) 12/27/2019   Lab Results  Component Value Date   CHOL 155 04/24/2018   HDL 50 04/24/2018   LDLCALC 71 04/24/2018   TRIG 171 (H) 04/24/2018    GEN- The patient is a well appearing elderly female, alert and oriented x 3 today.   HEENT-head normocephalic, atraumatic, sclera clear, conjunctiva pink, hearing intact, trachea midline. Lungs- Clear to ausculation bilaterally, normal work of breathing Heart- Regular rate and rhythm, no murmurs, rubs or gallops  GI- soft, NT, ND, + BS Extremities- no clubbing, cyanosis, or edema MS- no significant deformity or atrophy Skin- no rash or lesion Psych- euthymic mood, full affect Neuro- strength and sensation are intact   EKG-  SB, PAC Vent. rate 55 BPM PR interval 194 ms QRS duration 78 ms QT/QTcB 448/428 ms   Assessment and Plan: 1. Paroxysmal atrial fibrillation  Patient in SR today. She reports her palpitations have become less and less frequent.  Continue watchful waiting at this time.  Continue Eliquis 5 mg BID.  Check bmet/cbc today Continue diltiazem 30 mg PRN for heart racing. Continue Lopressor 25 mg BID  2. CHA2DS2VASc score of at least 6 Continue apixaban 5 mg bid   3. HTN Stable, no changes today.   Follow up with Dr [name] as scheduled. AF  clinic in one year.    [facility] [address] [place], [place] [address] [phone]

## 2021-10-12 ENCOUNTER — Other Ambulatory Visit: Payer: Self-pay | Admitting: Family Medicine

## 2021-10-12 DIAGNOSIS — E785 Hyperlipidemia, unspecified: Secondary | ICD-10-CM

## 2021-10-28 ENCOUNTER — Non-Acute Institutional Stay: Payer: Medicare Other | Admitting: Internal Medicine

## 2021-10-28 ENCOUNTER — Encounter: Payer: Self-pay | Admitting: Internal Medicine

## 2021-10-28 VITALS — BP 130/84 | HR 58 | Temp 97.3°F | Resp 18 | Ht 64.0 in | Wt 149.4 lb

## 2021-10-28 DIAGNOSIS — I10 Essential (primary) hypertension: Secondary | ICD-10-CM | POA: Diagnosis not present

## 2021-10-28 DIAGNOSIS — N390 Urinary tract infection, site not specified: Secondary | ICD-10-CM

## 2021-10-28 DIAGNOSIS — I480 Paroxysmal atrial fibrillation: Secondary | ICD-10-CM

## 2021-10-28 DIAGNOSIS — F419 Anxiety disorder, unspecified: Secondary | ICD-10-CM | POA: Diagnosis not present

## 2021-10-28 DIAGNOSIS — J309 Allergic rhinitis, unspecified: Secondary | ICD-10-CM | POA: Diagnosis not present

## 2021-10-28 DIAGNOSIS — Z13820 Encounter for screening for osteoporosis: Secondary | ICD-10-CM

## 2021-10-28 DIAGNOSIS — K580 Irritable bowel syndrome with diarrhea: Secondary | ICD-10-CM

## 2021-10-28 DIAGNOSIS — M8588 Other specified disorders of bone density and structure, other site: Secondary | ICD-10-CM

## 2021-10-28 NOTE — Progress Notes (Signed)
Location:  [place] of Service:  Clinic (12)  Provider:   Code Status:  Goals of Care:     10/28/2021    8:04 AM  Advanced Directives  Does Patient Have a Medical Advance Directive? Yes  Type of Paramedic of [place];Living will;Out of facility DNR (pink MOST or yellow form)  Does patient want to make changes to medical advance directive? No - Patient declined  Copy of [place] in Chart? No - copy requested     Chief Complaint  Patient presents with   Establish Care    New patient establishing care.     HPI: Patient is a 88 y.o. female seen today for medical management of chronic diseases.    Patient has h/o  PAF Follows with A Fib clinic On Eliquis Takes Cardizem PRN for HR more then 100 Does get palpitations and SOB sometimes but follows closely with A Fib Clinic   Recurent UTI On Cranberry tablets, Estrace and Recently on Macrobid. Follows with Dr Claudia Desanctis Anxiety on Xanax HLD,  HTN,  IBS with Diarrhea.. Does not think has had work up done before Insomnia Allergic Rhinitis  Son is Anesthesiologist lives in [facility] Daughter in [place] Does not drive anymore  Past Medical History:  Diagnosis Date   Allergic rhinitis    Arthritis    "hands, little in my feet" (06/22/2016)   Candidiasis of vulva and vagina    Cystocele, midline    Disorder of bone and cartilage, unspecified    Fibroids    Hypertension    Large hiatal hernia 06/23/2016   Migraine    "none in the last few years" (06/22/2016)   Osteopenia    Other chronic cystitis    Other sign and symptom in breast    Paroxysmal atrial fibrillation ([place]) 11/30/2012   Pneumonia    "I've had walking pneumonia a couple times"  (06/22/2016)   Pneumonia dx'd 06/15/2016   Rectal incontinence    Stroke ([place]) 2014   hx of mini stroke    TIA (transient ischemic attack) 2015    Past Surgical History:  Procedure Laterality Date    CARDIOVERSION N/A 06/29/2016   Procedure: CARDIOVERSION;  Surgeon: Jerline Pain, MD;  Location: [facility];  Service: Cardiovascular;  Laterality: N/A;   CATARACT EXTRACTION W/ INTRAOCULAR LENS IMPLANT Left    DILATION AND CURETTAGE OF UTERUS     hiatel hernia  08/20/2016   HYSTEROSCOPY WITH D & C  03/16/1999   INSERTION OF MESH N/A 08/20/2016   Procedure: INSERTION OF MESH;  Surgeon: Ralene Ok, MD;  Location: [facility];  Service: General;  Laterality: N/A;   TEE WITHOUT CARDIOVERSION N/A 11/06/2012   Procedure: TRANSESOPHAGEAL ECHOCARDIOGRAM (TEE);  Surgeon: Lelon Perla, MD;  Location: [facility];  Service: Cardiovascular;  Laterality: N/A;   TONSILLECTOMY  1937   VAGINAL HYSTERECTOMY  09/2005   Anterior repair; Removal of urethral caruncle./notes 09/02/2015    Allergies  Allergen Reactions   Lactose Other (See Comments)    Dairy Sensitivity  Dairy Sensitivity     Lisinopril Other (See Comments)    DIZZY   Bactrim [Sulfamethoxazole-Trimethoprim] Rash    Outpatient Encounter Medications as of 10/28/2021  Medication Sig   acetaminophen (TYLENOL) 500 MG tablet Take 500-1,000 mg by mouth every 6 (six) hours as needed (for headache/pain.).   ALPRAZolam (XANAX) 0.25 MG tablet TAKE 1 TABLET(0.25 MG) BY MOUTH TWICE DAILY AS NEEDED FOR  ANXIETY   amLODipine (NORVASC) 5 MG tablet Take 1 tablet (5 mg total) by mouth daily.   budesonide (RHINOCORT AQUA) 32 MCG/ACT nasal spray Place 1 spray into both nostrils as needed.    CALCIUM CARBONATE-VIT D-MIN PO Take 2 tablets by mouth 3 (three) times a week.    CRANBERRY PO Take 1 tablet by mouth daily.   diltiazem (CARDIZEM) 30 MG tablet Take 1 tablet every 4 hours AS NEEDED for AFIB heart rate >100   ELIQUIS 5 MG TABS tablet TAKE 1 TABLET BY MOUTH TWICE DAILY   estradiol (ESTRACE) 0.1 MG/GM vaginal cream Place vaginally.   Eyelid Cleansers (OCUSOFT EYELID CLEANSING EX) Apply topically.   Glucosamine-Chondroitin (COSAMIN DS PO) Take 1  tablet by mouth 2 (two) times daily.   lactase (LACTAID) 3000 units tablet Take by mouth as needed.   loperamide (IMODIUM A-D) 2 MG tablet Take 2 mg by mouth as needed for diarrhea or loose stools.   loratadine (CLARITIN) 10 MG tablet Take 10 mg by mouth daily as needed for allergies.    meclizine (ANTIVERT) 25 MG tablet Take 1 tablet (25 mg total) by mouth 3 (three) times daily as needed for up to 21 doses for dizziness or nausea.   metoprolol tartrate (LOPRESSOR) 25 MG tablet TAKE 1 TABLET BY MOUTH TWICE DAILY   nitrofurantoin, macrocrystal-monohydrate, (MACROBID) 100 MG capsule Take 100 mg by mouth daily.   ondansetron (ZOFRAN) 4 MG tablet Take 1 tablet (4 mg total) by mouth every 8 (eight) hours as needed for nausea or vomiting.   Polyethyl Glycol-Propyl Glycol (SYSTANE OP) Place 1 drop into both eyes 2 (two) times daily as needed (for dry eyes).    pravastatin (PRAVACHOL) 40 MG tablet TAKE 1 TABLET(40 MG) BY MOUTH DAILY   Probiotic Product (PROBIOTIC DAILY) CAPS Take 2 capsules by mouth daily.   Psyllium (METAMUCIL FIBER PO) Take by mouth as needed.    Vitamins-Lipotropics (LIPOFLAVONOID PO) Take 1 tablet by mouth 2 (two) times daily as needed (for ringing in ears).   No facility-administered encounter medications on file as of 10/28/2021.    Review of Systems:  Review of Systems  Constitutional:  Negative for activity change and appetite change.  HENT: Negative.    Respiratory:  Negative for cough and shortness of breath.   Cardiovascular:  Negative for leg swelling.  Gastrointestinal:  Negative for constipation.  Genitourinary: Negative.   Musculoskeletal:  Negative for arthralgias, gait problem and myalgias.  Skin: Negative.   Neurological:  Negative for dizziness and weakness.  Psychiatric/Behavioral:  Positive for sleep disturbance. Negative for confusion and dysphoric mood. The patient is nervous/anxious.     Health Maintenance  Topic Date Due   COVID-19 Vaccine (5 -  Moderna series) 06/02/2021   INFLUENZA VACCINE  11/17/2021   TETANUS/TDAP  06/15/2026   Pneumonia Vaccine 7+ Years old  Completed   DEXA SCAN  Completed   Zoster Vaccines- Shingrix  Completed   HPV VACCINES  Aged Out   MAMMOGRAM  Discontinued    Physical Exam: Vitals:   10/28/21 0818  BP: 130/84  Pulse: (!) 58  Resp: 18  Temp: (!) 97.3 F (36.3 C)  TempSrc: Temporal  SpO2: 98%  Weight: 149 lb 6.4 oz (67.8 kg)  Height: '5\' 4"'$  (1.626 m)   Body mass index is 25.64 kg/m. Physical Exam Vitals reviewed.  Constitutional:      Appearance: Normal appearance.  HENT:     Head: Normocephalic.     Nose: Nose normal.  Mouth/Throat:     Mouth: Mucous membranes are moist.     Pharynx: Oropharynx is clear.  Eyes:     Pupils: Pupils are equal, round, and reactive to light.  Cardiovascular:     Rate and Rhythm: Normal rate and regular rhythm.     Pulses: Normal pulses.     Heart sounds: Normal heart sounds. No murmur heard. Pulmonary:     Effort: Pulmonary effort is normal.     Breath sounds: Normal breath sounds.  Abdominal:     General: Abdomen is flat. Bowel sounds are normal.     Palpations: Abdomen is soft.  Musculoskeletal:     Cervical back: Neck supple.     Comments: Mild edema Bilateral  Skin:    General: Skin is warm.  Neurological:     General: No focal deficit present.     Mental Status: She is alert and oriented to person, place, and time.  Psychiatric:        Mood and Affect: Mood normal.        Thought Content: Thought content normal.     Labs reviewed: Basic Metabolic Panel: Recent Labs    10/01/21 1151  NA 141  K 4.2  CL 108  CO2 24  GLUCOSE 104*  BUN 18  CREATININE 0.81  CALCIUM 9.5   Liver Function Tests: No results for input(s): "AST", "ALT", "ALKPHOS", "BILITOT", "PROT", "ALBUMIN" in the last 8760 hours. No results for input(s): "LIPASE", "AMYLASE" in the last 8760 hours. No results for input(s): "AMMONIA" in the last 8760  hours. CBC: Recent Labs    10/01/21 1151  WBC 7.1  HGB 13.2  HCT 41.8  MCV 93.9  PLT 272   Lipid Panel: No results for input(s): "CHOL", "HDL", "LDLCALC", "TRIG", "CHOLHDL", "LDLDIRECT" in the last 8760 hours. Lab Results  Component Value Date   HGBA1C 5.9 (A) 12/04/2019    Procedures since last visit: No results found.  Assessment/Plan 1. Essential hypertension, benign On Amlodipine  2. PAF (paroxysmal atrial fibrillation) (HCC) On Eliquis and Lopressor  Cardizem PRN  3. Osteopenia, unspecified location Repeat DEXA On Calcium and Vit D  4. Anxiety Uses Xanax PRN Wants to change her prescription to [place]  5. Allergic rhinitis, mild On Budesonide Nasal Spray  6. Irritable bowel syndrome with diarrhea Has not had work up before but   7. Recurrent UTI Follows with Urology On Macrobid  8 BPPV Takes Meclizine PRN Reduce dose to 12.5 mg PRN 9 HLD On statin Does nto want me to order labs as follows with Cardiology    Labs/tests ordered:  * No order type specified * Next appt:  Visit date not found

## 2021-10-28 NOTE — Patient Instructions (Signed)
I am ordering Bone density in [facility]. You can call and Make appointment with them We will see you in 4 months If you need blood test we can do it here in clinic Let your pharmacy know that we will do refills for you

## 2021-11-09 ENCOUNTER — Other Ambulatory Visit: Payer: Self-pay | Admitting: Internal Medicine

## 2021-11-09 DIAGNOSIS — M8580 Other specified disorders of bone density and structure, unspecified site: Secondary | ICD-10-CM

## 2021-12-07 ENCOUNTER — Other Ambulatory Visit: Payer: Self-pay | Admitting: Cardiology

## 2021-12-07 NOTE — Telephone Encounter (Signed)
Prescription refill request for Eliquis received. Indication: PAF Last office visit: 10/01/21  C Fenton PA Scr: 0.81 on 10/01/21 Age: 88 Weight: 67kg  Based on above findings Eliquis '5mg'$  twice daily is the appropriate dose.  Refill approved.

## 2021-12-10 ENCOUNTER — Telehealth: Payer: Self-pay

## 2021-12-10 ENCOUNTER — Telehealth: Payer: Self-pay | Admitting: Cardiology

## 2021-12-10 DIAGNOSIS — E785 Hyperlipidemia, unspecified: Secondary | ICD-10-CM

## 2021-12-10 MED ORDER — PRAVASTATIN SODIUM 40 MG PO TABS: ORAL_TABLET | ORAL | 1 refills | Status: DC

## 2021-12-10 NOTE — Telephone Encounter (Signed)
Patient's daughter called requesting refill of medication.

## 2021-12-10 NOTE — Telephone Encounter (Signed)
*  STAT* If patient is at the pharmacy, call can be transferred to refill team.   1. Which medications need to be refilled? (please list name of each medication and dose if known) amLODipine (NORVASC) 5 MG tablet  2. Which pharmacy/location (including street and city if local pharmacy) is medication to be sent to?[facility] [place], [place] - [address] AT [place]  3. Do they need a 30 day or 90 day supply? 90 day

## 2021-12-14 ENCOUNTER — Other Ambulatory Visit: Payer: Self-pay

## 2021-12-14 MED ORDER — AMLODIPINE BESYLATE 5 MG PO TABS: 5.0000 mg | ORAL_TABLET | Freq: Every day | ORAL | 3 refills | Status: DC

## 2021-12-14 NOTE — Telephone Encounter (Signed)
Pt's daughter is calling again requesting a refill on Amlodipine. Daughter stated that she keep calling and still have not received the pt's medication. Please address

## 2021-12-29 NOTE — Progress Notes (Signed)
[name] Date of Birth: 1930/07/14 Medical Record #[id number]  History of Present Illness: Mrs. Kise is seen for followup for history of TIA and paroxysmal atrial fibrillation.  She is on anticoagulation with Eliquis.  She is on metoprolol and cardizem for rate control and prior event monitor showed good control when she is in Afib.  She  was admitted in March 2018 with nausea vomiting and the chest pain. Ultrasound of abdomen does not show any gallstones. During the hospitalization, she developed worsening shortness of breath after aggressive IV hydration. She was treated with IV Lasix afterward with good urinary output. She was also found to be hypothyroid and received thyroid replacement. Her hospital stay was complicated by atrial fibrillation with RVR. She was  on eliquis. Troponin was elevated and a BNP was 836. Cardiology was consulted for atrial fibrillation and elevated troponin. Elevated troponin was felt to be related to demand ischemia with heart failure and the pneumonia. Echocardiogram obtained on 06/28/2016 showed EF 57-01%, grade 1 diastolic dysfunction. She required cardioversion on 06/29/2016 but was unable to maintain NSR.   On 08/21/16 she was readmitted and underwent Nissen fundoplication without complication.   Last summer she was seen in the Afib clinic with Afib. She was treated with long acting Cardizem as well as 30 mg po prn. She states this has helped. She is seen today with her son who is an anesthesiologist in [place]. She still has Afib. Not really sustained. Has a [place]ion[employer] monitor but doesn't like to use it.  She is at independent living at [facility]   She has been followed in the Afib clinic. Last seen in  June and was in NSR.  Minimally symptomatic. Continued on lopressor and Eliquis with prn diltiazem. She is seen with her daughter today. Sometimes feels she is in Afib in the morning but doesn't bother to check it. No significant SOB or chest pain. Minimal ankle  swelling at times.    Current Outpatient Medications on File Prior to Visit  Medication Sig Dispense Refill   acetaminophen (TYLENOL) 500 MG tablet Take 500-1,000 mg by mouth every 6 (six) hours as needed (for headache/pain.).     ALPRAZolam (XANAX) 0.25 MG tablet TAKE 1 TABLET(0.25 MG) BY MOUTH TWICE DAILY AS NEEDED FOR ANXIETY 30 tablet 0   amLODipine (NORVASC) 5 MG tablet Take 1 tablet (5 mg total) by mouth daily. 90 tablet 3   budesonide (RHINOCORT AQUA) 32 MCG/ACT nasal spray Place 1 spray into both nostrils as needed.      CALCIUM CARBONATE-VIT D-MIN PO Take 2 tablets by mouth 3 (three) times a week.      diltiazem (CARDIZEM) 30 MG tablet Take 1 tablet every 4 hours AS NEEDED for AFIB heart rate >100 45 tablet 1   ELIQUIS 5 MG TABS tablet TAKE 1 TABLET BY MOUTH TWICE DAILY 180 tablet 1   estradiol (ESTRACE) 0.1 MG/GM vaginal cream Place vaginally.     Eyelid Cleansers (OCUSOFT EYELID CLEANSING EX) Apply topically.     Glucosamine-Chondroitin (COSAMIN DS PO) Take 1 tablet by mouth 2 (two) times daily.     lactase (LACTAID) 3000 units tablet Take by mouth as needed.     loperamide (IMODIUM A-D) 2 MG tablet Take 2 mg by mouth as needed for diarrhea or loose stools.     loratadine (CLARITIN) 10 MG tablet Take 10 mg by mouth daily as needed for allergies.      meclizine (ANTIVERT) 25 MG tablet Take 1 tablet (25  mg total) by mouth 3 (three) times daily as needed for up to 21 doses for dizziness or nausea. 21 tablet 0   metoprolol tartrate (LOPRESSOR) 25 MG tablet TAKE 1 TABLET BY MOUTH TWICE DAILY 180 tablet 3   nitrofurantoin, macrocrystal-monohydrate, (MACROBID) 100 MG capsule Take 100 mg by mouth daily.     ondansetron (ZOFRAN) 4 MG tablet Take 1 tablet (4 mg total) by mouth every 8 (eight) hours as needed for nausea or vomiting. 20 tablet 0   Polyethyl Glycol-Propyl Glycol (SYSTANE OP) Place 1 drop into both eyes 2 (two) times daily as needed (for dry eyes).      pravastatin (PRAVACHOL) 40  MG tablet TAKE 1 TABLET(40 MG) BY MOUTH DAILY 90 tablet 1   Probiotic Product (PROBIOTIC DAILY) CAPS Take 2 capsules by mouth daily.     Psyllium (METAMUCIL FIBER PO) Take by mouth as needed.      Vitamins-Lipotropics (LIPOFLAVONOID PO) Take 1 tablet by mouth 2 (two) times daily as needed (for ringing in ears).     CRANBERRY PO Take 1 tablet by mouth daily. (Patient not taking: Reported on 01/01/2022)     No current facility-administered medications on file prior to visit.    Allergies  Allergen Reactions   Lactose Other (See Comments)    Dairy Sensitivity  Dairy Sensitivity     Lisinopril Other (See Comments)    DIZZY   Bactrim [Sulfamethoxazole-Trimethoprim] Rash    Past Medical History:  Diagnosis Date   Allergic rhinitis    Arthritis    "hands, little in my feet" (06/22/2016)   Candidiasis of vulva and vagina    Cystocele, midline    Disorder of bone and cartilage, unspecified    Fibroids    Hypertension    Large hiatal hernia 06/23/2016   Migraine    "none in the last few years" (06/22/2016)   Osteopenia    Other chronic cystitis    Other sign and symptom in breast    Paroxysmal atrial fibrillation ([place]) 11/30/2012   Pneumonia    "I've had walking pneumonia a couple times"  (06/22/2016)   Pneumonia dx'd 06/15/2016   Rectal incontinence    Stroke ([place]) 2014   hx of mini stroke    TIA (transient ischemic attack) 2015    Past Surgical History:  Procedure Laterality Date   CARDIOVERSION N/A 06/29/2016   Procedure: CARDIOVERSION;  Surgeon: Jerline Pain, MD;  Location: [facility];  Service: Cardiovascular;  Laterality: N/A;   CATARACT EXTRACTION W/ INTRAOCULAR LENS IMPLANT Left    DILATION AND CURETTAGE OF UTERUS     hiatel hernia  08/20/2016   HYSTEROSCOPY WITH D & C  03/16/1999   INSERTION OF MESH N/A 08/20/2016   Procedure: INSERTION OF MESH;  Surgeon: Ralene Ok, MD;  Location: [facility];  Service: General;  Laterality: N/A;   TEE WITHOUT CARDIOVERSION N/A 11/06/2012    Procedure: TRANSESOPHAGEAL ECHOCARDIOGRAM (TEE);  Surgeon: Lelon Perla, MD;  Location: [facility];  Service: Cardiovascular;  Laterality: N/A;   TONSILLECTOMY  1937   VAGINAL HYSTERECTOMY  09/2005   Anterior repair; Removal of urethral caruncle./notes 09/02/2015    Social History   Tobacco Use  Smoking Status Never  Smokeless Tobacco Never  Tobacco Comments   Never smoke 10/01/21    Social History   Substance and Sexual Activity  Alcohol Use Yes   Alcohol/week: 1.0 standard drink of alcohol   Types: 1 Glasses of wine per week   Comment: one glass of  wine once a month 10/01/21    Family History  Problem Relation Age of Onset   Cancer Mother    Hypertension Mother    Heart disease Father    Heart disease Brother    Emphysema Brother     Review of Systems: As noted in history of present illness. All other systems were reviewed and are negative.  Physical Exam: BP 130/70   Pulse (!) 55   Ht '5\' 5"'$  (1.651 m)   Wt 149 lb 9.6 oz (67.9 kg)   SpO2 96%   BMI 24.89 kg/m  GENERAL:  Well appearing WF in NAD HEENT:  PERRL, EOMI, sclera are clear. Oropharynx is clear. NECK:  No jugular venous distention, carotid upstroke brisk and symmetric, no bruits, no thyromegaly or adenopathy LUNGS:  Clear to auscultation bilaterally CHEST:  Unremarkable HEART:  RRR,  PMI not displaced or sustained,S1 and S2 within normal limits, no S3, no S4: no clicks, no rubs, no murmurs ABD:  Soft, nontender. BS +, no masses or bruits. No hepatomegaly, no splenomegaly EXT:  2 + pulses throughout, no edema, no cyanosis no clubbing SKIN:  Warm and dry.  No rashes NEURO:  Alert and oriented x 3. Cranial nerves II through XII intact. PSYCH:  Cognitively intact      LABORATORY DATA: Lab Results  Component Value Date   WBC 7.1 10/01/2021   HGB 13.2 10/01/2021   HCT 41.8 10/01/2021   PLT 272 10/01/2021   GLUCOSE 104 (H) 10/01/2021   CHOL 155 04/24/2018   TRIG 171 (H) 04/24/2018   HDL  50 04/24/2018   LDLCALC 71 04/24/2018   ALT 24 12/27/2019   AST 58 (H) 12/27/2019   NA 141 10/01/2021   K 4.2 10/01/2021   CL 108 10/01/2021   CREATININE 0.81 10/01/2021   BUN 18 10/01/2021   CO2 24 10/01/2021   TSH 2.350 02/05/2019   INR 1.5 (H) 12/27/2019   HGBA1C 5.9 (A) 12/04/2019    Echo 07/04/16: Study Conclusions   - Left ventricle: The cavity size was normal. Wall thickness was   increased in a pattern of mild LVH. Systolic function was normal.   The estimated ejection fraction was in the range of 50% to 55%.   Wall motion was normal; there were no regional wall motion   abnormalities. Doppler parameters are consistent with abnormal   left ventricular relaxation (grade 1 diastolic dysfunction). - Mitral valve: Moderately calcified annulus. There was moderate   regurgitation. - Left atrium: The atrium was mildly dilated. - Right atrium: The atrium was mildly dilated.   Assessment / Plan:  1. TIA. On long-term anticoagulation with Eliquis. Tolerating well.   2. Paroxysmal atrial fibrillation.  Continue rate control with metoprolol. She seems to have learned to live with her AFib. Symptoms have not changed significantly this year.  Labs OK. I told her I would be happy with PCP checking her routine labs.   3. Hypertension- BP well controlled.  4. Hyperlipidemia-on pravastatin with good results.  5. S/p Nissen fundoplication.   I will follow up in 6 months.

## 2022-01-01 ENCOUNTER — Encounter: Payer: Self-pay | Admitting: Cardiology

## 2022-01-01 ENCOUNTER — Ambulatory Visit: Payer: Medicare Other | Attending: Cardiology | Admitting: Cardiology

## 2022-01-01 VITALS — BP 130/70 | HR 55 | Ht 65.0 in | Wt 149.6 lb

## 2022-01-01 DIAGNOSIS — I10 Essential (primary) hypertension: Secondary | ICD-10-CM

## 2022-01-01 DIAGNOSIS — I480 Paroxysmal atrial fibrillation: Secondary | ICD-10-CM

## 2022-01-01 DIAGNOSIS — D6869 Other thrombophilia: Secondary | ICD-10-CM | POA: Diagnosis present

## 2022-01-19 ENCOUNTER — Other Ambulatory Visit: Payer: Self-pay

## 2022-01-19 ENCOUNTER — Inpatient Hospital Stay (HOSPITAL_COMMUNITY)
Admission: EM | Admit: 2022-01-19 | Discharge: 2022-01-26 | DRG: 309 | Disposition: A | Discharge status: Skilled Nursing Facility | Payer: Medicare Other | Attending: Internal Medicine | Admitting: Internal Medicine

## 2022-01-19 ENCOUNTER — Other Ambulatory Visit (HOSPITAL_BASED_OUTPATIENT_CLINIC_OR_DEPARTMENT_OTHER): Payer: Self-pay

## 2022-01-19 ENCOUNTER — Emergency Department (HOSPITAL_COMMUNITY): Payer: Medicare Other

## 2022-01-19 DIAGNOSIS — I2489 Other forms of acute ischemic heart disease: Secondary | ICD-10-CM | POA: Diagnosis present

## 2022-01-19 DIAGNOSIS — Z881 Allergy status to other antibiotic agents status: Secondary | ICD-10-CM

## 2022-01-19 DIAGNOSIS — Z8249 Family history of ischemic heart disease and other diseases of the circulatory system: Secondary | ICD-10-CM

## 2022-01-19 DIAGNOSIS — Z888 Allergy status to other drugs, medicaments and biological substances status: Secondary | ICD-10-CM

## 2022-01-19 DIAGNOSIS — I77810 Thoracic aortic ectasia: Secondary | ICD-10-CM | POA: Diagnosis present

## 2022-01-19 DIAGNOSIS — N390 Urinary tract infection, site not specified: Secondary | ICD-10-CM | POA: Diagnosis present

## 2022-01-19 DIAGNOSIS — I4891 Unspecified atrial fibrillation: Principal | ICD-10-CM

## 2022-01-19 DIAGNOSIS — I10 Essential (primary) hypertension: Secondary | ICD-10-CM | POA: Diagnosis present

## 2022-01-19 DIAGNOSIS — I480 Paroxysmal atrial fibrillation: Secondary | ICD-10-CM | POA: Diagnosis not present

## 2022-01-19 DIAGNOSIS — Z79899 Other long term (current) drug therapy: Secondary | ICD-10-CM

## 2022-01-19 DIAGNOSIS — E860 Dehydration: Secondary | ICD-10-CM | POA: Diagnosis present

## 2022-01-19 DIAGNOSIS — Z882 Allergy status to sulfonamides status: Secondary | ICD-10-CM

## 2022-01-19 DIAGNOSIS — F419 Anxiety disorder, unspecified: Secondary | ICD-10-CM | POA: Diagnosis present

## 2022-01-19 DIAGNOSIS — B9620 Unspecified Escherichia coli [E. coli] as the cause of diseases classified elsewhere: Secondary | ICD-10-CM | POA: Diagnosis present

## 2022-01-19 DIAGNOSIS — N289 Disorder of kidney and ureter, unspecified: Secondary | ICD-10-CM | POA: Diagnosis present

## 2022-01-19 DIAGNOSIS — I059 Rheumatic mitral valve disease, unspecified: Secondary | ICD-10-CM | POA: Diagnosis present

## 2022-01-19 DIAGNOSIS — Z87440 Personal history of urinary (tract) infections: Secondary | ICD-10-CM

## 2022-01-19 DIAGNOSIS — R531 Weakness: Secondary | ICD-10-CM | POA: Diagnosis not present

## 2022-01-19 DIAGNOSIS — Z79890 Hormone replacement therapy (postmenopausal): Secondary | ICD-10-CM

## 2022-01-19 DIAGNOSIS — Z1624 Resistance to multiple antibiotics: Secondary | ICD-10-CM | POA: Diagnosis present

## 2022-01-19 DIAGNOSIS — Z91011 Allergy to milk products: Secondary | ICD-10-CM

## 2022-01-19 DIAGNOSIS — T3695XA Adverse effect of unspecified systemic antibiotic, initial encounter: Secondary | ICD-10-CM | POA: Diagnosis present

## 2022-01-19 DIAGNOSIS — Z7901 Long term (current) use of anticoagulants: Secondary | ICD-10-CM

## 2022-01-19 DIAGNOSIS — Z792 Long term (current) use of antibiotics: Secondary | ICD-10-CM

## 2022-01-19 DIAGNOSIS — K521 Toxic gastroenteritis and colitis: Secondary | ICD-10-CM | POA: Diagnosis present

## 2022-01-19 DIAGNOSIS — E785 Hyperlipidemia, unspecified: Secondary | ICD-10-CM | POA: Diagnosis present

## 2022-01-19 DIAGNOSIS — Z66 Do not resuscitate: Secondary | ICD-10-CM | POA: Diagnosis present

## 2022-01-19 DIAGNOSIS — Z90710 Acquired absence of both cervix and uterus: Secondary | ICD-10-CM

## 2022-01-19 DIAGNOSIS — Z8673 Personal history of transient ischemic attack (TIA), and cerebral infarction without residual deficits: Secondary | ICD-10-CM

## 2022-01-19 DIAGNOSIS — I951 Orthostatic hypotension: Secondary | ICD-10-CM | POA: Diagnosis not present

## 2022-01-19 DIAGNOSIS — Z825 Family history of asthma and other chronic lower respiratory diseases: Secondary | ICD-10-CM

## 2022-01-19 LAB — COMPREHENSIVE METABOLIC PANEL
ALT: 18 U/L (ref 0–44)
AST: 30 U/L (ref 15–41)
Albumin: 4.6 g/dL (ref 3.5–5.0)
Alkaline Phosphatase: 61 U/L (ref 38–126)
Anion gap: 14 (ref 5–15)
BUN: 14 mg/dL (ref 8–23)
CO2: 21 mmol/L — ABNORMAL LOW (ref 22–32)
Calcium: 9.9 mg/dL (ref 8.9–10.3)
Chloride: 103 mmol/L (ref 98–111)
Creatinine, Ser: 0.85 mg/dL (ref 0.44–1.00)
GFR, Estimated: >60 mL/min (ref >60)
Glucose, Bld: 120 mg/dL — ABNORMAL HIGH (ref 70–99)
Potassium: 4 mmol/L (ref 3.5–5.1)
Sodium: 138 mmol/L (ref 135–145)
Total Bilirubin: 0.6 mg/dL (ref 0.3–1.2)
Total Protein: 8.5 g/dL — ABNORMAL HIGH (ref 6.5–8.1)

## 2022-01-19 LAB — CBC WITH DIFFERENTIAL/PLATELET
Abs Immature Granulocytes: 0.05 10*3/uL (ref 0.00–0.07)
Basophils Absolute: 0 10*3/uL (ref 0.0–0.1)
Basophils Relative: 0 %
Eosinophils Absolute: 0 10*3/uL (ref 0.0–0.5)
Eosinophils Relative: 0 %
HCT: 47.7 % — ABNORMAL HIGH (ref 36.0–46.0)
Hemoglobin: 15.9 g/dL — ABNORMAL HIGH (ref 12.0–15.0)
Immature Granulocytes: 1 %
Lymphocytes Relative: 19 %
Lymphs Abs: 1.7 10*3/uL (ref 0.7–4.0)
MCH: 30.8 pg (ref 26.0–34.0)
MCHC: 33.3 g/dL (ref 30.0–36.0)
MCV: 92.4 fL (ref 80.0–100.0)
Monocytes Absolute: 0.8 10*3/uL (ref 0.1–1.0)
Monocytes Relative: 9 %
Neutro Abs: 6.4 10*3/uL (ref 1.7–7.7)
Neutrophils Relative %: 71 %
Platelets: 263 10*3/uL (ref 150–400)
RBC: 5.16 MIL/uL — ABNORMAL HIGH (ref 3.87–5.11)
RDW: 13.5 % (ref 11.5–15.5)
WBC: 8.9 10*3/uL (ref 4.0–10.5)
nRBC: 0 % (ref 0.0–0.2)

## 2022-01-19 LAB — URINALYSIS, ROUTINE W REFLEX MICROSCOPIC
Bilirubin Urine: NEGATIVE
Glucose, UA: NEGATIVE mg/dL
Hgb urine dipstick: NEGATIVE
Ketones, ur: NEGATIVE mg/dL
Nitrite: POSITIVE — AB
Protein, ur: NEGATIVE mg/dL
Specific Gravity, Urine: 1.008 (ref 1.005–1.030)
pH: 6 (ref 5.0–8.0)

## 2022-01-19 LAB — TSH: TSH: 2.428 u[IU]/mL (ref 0.350–4.500)

## 2022-01-19 LAB — T4, FREE: Free T4: 0.84 ng/dL (ref 0.61–1.12)

## 2022-01-19 LAB — MAGNESIUM: Magnesium: 2.1 mg/dL (ref 1.7–2.4)

## 2022-01-19 MED ORDER — METOPROLOL TARTRATE 5 MG/5ML IV SOLN
2.5000 mg | Freq: Once | INTRAVENOUS | Status: AC
  Administered 2022-01-19: 2.5 mg via INTRAVENOUS

## 2022-01-19 MED ORDER — METOPROLOL TARTRATE 5 MG/5ML IV SOLN
5.0000 mg | Freq: Once | INTRAVENOUS | Status: AC
  Administered 2022-01-19: 5 mg via INTRAVENOUS
  Filled 2022-01-19: qty 5

## 2022-01-19 MED ORDER — METOPROLOL TARTRATE 5 MG/5ML IV SOLN
INTRAVENOUS | Status: AC
  Filled 2022-01-19: qty 5

## 2022-01-19 MED ORDER — ALPRAZOLAM 0.25 MG PO TABS
0.2500 mg | ORAL_TABLET | Freq: Two times a day (BID) | ORAL | Status: DC | PRN
  Administered 2022-01-20 – 2022-01-24 (×4): 0.25 mg via ORAL
  Filled 2022-01-19 (×4): qty 1

## 2022-01-19 MED ORDER — METOPROLOL TARTRATE 25 MG PO TABS
25.0000 mg | ORAL_TABLET | Freq: Once | ORAL | Status: AC
  Administered 2022-01-19: 25 mg via ORAL
  Filled 2022-01-19: qty 1

## 2022-01-19 MED ORDER — APIXABAN 5 MG PO TABS
5.0000 mg | ORAL_TABLET | Freq: Two times a day (BID) | ORAL | Status: DC
  Administered 2022-01-20 – 2022-01-26 (×14): 5 mg via ORAL
  Filled 2022-01-19 (×14): qty 1

## 2022-01-19 MED ORDER — SODIUM CHLORIDE 0.9 % IV SOLN
2.0000 g | Freq: Once | INTRAVENOUS | Status: AC
  Administered 2022-01-19: 2 g via INTRAVENOUS
  Filled 2022-01-19: qty 12.5

## 2022-01-19 MED ORDER — SODIUM CHLORIDE 0.9 % IV SOLN
2.0000 g | INTRAVENOUS | Status: DC
  Administered 2022-01-20 – 2022-01-22 (×3): 2 g via INTRAVENOUS
  Filled 2022-01-19 (×3): qty 12.5

## 2022-01-19 MED ORDER — LACTATED RINGERS BOLUS PEDS
500.0000 mL | Freq: Once | INTRAVENOUS | Status: AC
  Administered 2022-01-19: 500 mL via INTRAVENOUS

## 2022-01-19 MED ORDER — DILTIAZEM HCL-DEXTROSE 125-5 MG/125ML-% IV SOLN (PREMIX)
5.0000 mg/h | INTRAVENOUS | Status: DC
  Administered 2022-01-19: 5 mg/h via INTRAVENOUS
  Administered 2022-01-20: 15 mg/h via INTRAVENOUS
  Filled 2022-01-19 (×2): qty 125

## 2022-01-19 MED ORDER — ACETAMINOPHEN 500 MG PO TABS
1000.0000 mg | ORAL_TABLET | Freq: Once | ORAL | Status: AC
  Administered 2022-01-19: 1000 mg via ORAL
  Filled 2022-01-19: qty 2

## 2022-01-19 MED ORDER — ACETAMINOPHEN 325 MG PO TABS
650.0000 mg | ORAL_TABLET | Freq: Four times a day (QID) | ORAL | Status: DC | PRN
  Administered 2022-01-21 – 2022-01-25 (×9): 650 mg via ORAL
  Filled 2022-01-19 (×9): qty 2

## 2022-01-19 MED ORDER — ACETAMINOPHEN 650 MG RE SUPP: 650.0000 mg | Freq: Four times a day (QID) | RECTAL | Status: DC | PRN

## 2022-01-19 MED ORDER — PRAVASTATIN SODIUM 40 MG PO TABS
40.0000 mg | ORAL_TABLET | Freq: Every day | ORAL | Status: DC
  Administered 2022-01-20 – 2022-01-26 (×7): 40 mg via ORAL
  Filled 2022-01-19 (×7): qty 1

## 2022-01-19 MED ORDER — METOPROLOL TARTRATE 5 MG/5ML IV SOLN
5.0000 mg | Freq: Once | INTRAVENOUS | Status: DC
  Filled 2022-01-19: qty 5

## 2022-01-19 MED ORDER — FLUAD QUADRIVALENT 0.5 ML IM PRSY
PREFILLED_SYRINGE | INTRAMUSCULAR | 0 refills | Status: DC
  Filled 2022-01-19: qty 0.5, 1d supply, fill #0

## 2022-01-19 MED ORDER — PHENAZOPYRIDINE HCL 100 MG PO TABS
200.0000 mg | ORAL_TABLET | Freq: Once | ORAL | Status: AC | PRN
  Administered 2022-01-20: 200 mg via ORAL
  Filled 2022-01-19: qty 2

## 2022-01-19 NOTE — H&P (Signed)
History and Physical    [name] TDS:[id number] DOB: 1930/06/11 DOA: 01/19/2022  PCP: [name], MD  Patient coming from: Home  Chief Complaint: Generalized weakness  HPI: [name] is a 88 y.o. female with medical history significant of paroxysmal A-fib on Eliquis, TIA, hypertension, hyperlipidemia, recurrent UTIs on chronic Macrobid therapy presented to the ED via EMS for evaluation of generalized weakness and A-fib with RVR with rate up to 150s.  Afebrile.  Labs showing no leukocytosis, potassium and magnesium normal, TSH and free T4 normal.  UA showing positive nitrite, moderate leukocytes, and microscopy with 21-50 WBCs and many bacteria.  Chest x-ray negative for acute finding. Patient was given Tylenol, a total of IV metoprolol 12.5 mg, oral metoprolol 25 mg, cefepime, and a total of 1 L IV fluids.  Rate continued to be uncontrolled and Cardizem gtt ordered.  Patient reports 1 day history of generalized weakness.  She denies palpitations, lightheadedness/dizziness, chest pain, shortness of breath.  Denies any loss of consciousness or falls.  She reports compliance with her home medications including Eliquis, metoprolol, and Cardizem.  She reports history of chronic UTIs for which she takes Macrobid.  For the past 3 days she is experiencing suprapubic discomfort, dysuria, urinary frequency, and urgency.  No fevers, nausea, vomiting, or flank pain.  She thinks she is dehydrated as she has not been drinking enough fluids.  Review of Systems:  Review of Systems  All other systems reviewed and are negative.   Past Medical History:  Diagnosis Date   Allergic rhinitis    Arthritis    "hands, little in my feet" (06/22/2016)   Candidiasis of vulva and vagina    Cystocele, midline    Disorder of bone and cartilage, unspecified    Fibroids    Hypertension    Large hiatal hernia 06/23/2016   Migraine    "none in the last few years" (06/22/2016)   Osteopenia    Other chronic  cystitis    Other sign and symptom in breast    Paroxysmal atrial fibrillation ([place]) 11/30/2012   Pneumonia    "I've had walking pneumonia a couple times"  (06/22/2016)   Pneumonia dx'd 06/15/2016   Rectal incontinence    Stroke ([place]) 2014   hx of mini stroke    TIA (transient ischemic attack) 2015    Past Surgical History:  Procedure Laterality Date   CARDIOVERSION N/A 06/29/2016   Procedure: CARDIOVERSION;  Surgeon: Jerline Pain, MD;  Location: [facility];  Service: Cardiovascular;  Laterality: N/A;   CATARACT EXTRACTION W/ INTRAOCULAR LENS IMPLANT Left    DILATION AND CURETTAGE OF UTERUS     hiatel hernia  08/20/2016   HYSTEROSCOPY WITH D & C  03/16/1999   INSERTION OF MESH N/A 08/20/2016   Procedure: INSERTION OF MESH;  Surgeon: Ralene Ok, MD;  Location: [facility];  Service: General;  Laterality: N/A;   TEE WITHOUT CARDIOVERSION N/A 11/06/2012   Procedure: TRANSESOPHAGEAL ECHOCARDIOGRAM (TEE);  Surgeon: Lelon Perla, MD;  Location: [facility] ENDOSCOPY;  Service: Cardiovascular;  Laterality: N/A;   TONSILLECTOMY  1937   VAGINAL HYSTERECTOMY  09/2005   Anterior repair; Removal of urethral caruncle./notes 09/02/2015     reports that she has never smoked. She has never used smokeless tobacco. She reports current alcohol use of about 1.0 standard drink of alcohol per week. She reports that she does not use drugs.  Allergies  Allergen Reactions   Lactose Other (See Comments)    Dairy  Sensitivity  Dairy Sensitivity     Lisinopril Other (See Comments)    DIZZY   Bactrim [Sulfamethoxazole-Trimethoprim] Rash    Family History  Problem Relation Age of Onset   Cancer Mother    Hypertension Mother    Heart disease Father    Heart disease Brother    Emphysema Brother     Prior to Admission medications   Medication Sig Start Date End Date Taking? Authorizing Provider  acetaminophen (TYLENOL) 500 MG tablet Take 500-1,000 mg by mouth every 6 (six) hours as needed (for  headache/pain.).   Yes [provider]  ALPRAZolam (XANAX) 0.25 MG tablet TAKE 1 TABLET(0.25 MG) BY MOUTH TWICE DAILY AS NEEDED FOR ANXIETY Patient taking differently: Take 0.25 mg by mouth 2 (two) times daily as needed for anxiety. 08/28/20  Yes [name], MD  amLODipine (NORVASC) 5 MG tablet Take 1 tablet (5 mg total) by mouth daily. 12/14/21 12/14/22 Yes [name], [name], MD  CALCIUM CARBONATE-VIT D-MIN PO Take 2 tablets by mouth 3 (three) times a week.    Yes [provider]  CRANBERRY PO Take 1 tablet by mouth daily.   Yes [provider]  ELIQUIS 5 MG TABS tablet TAKE 1 TABLET BY MOUTH TWICE DAILY Patient taking differently: Take 5 mg by mouth 2 (two) times daily. 12/07/21  Yes [name], [name], MD  estradiol (ESTRACE) 0.1 MG/GM vaginal cream Place 1 Applicatorful vaginally 3 (three) times a week. 08/22/20  Yes [provider]  Eyelid Cleansers (OCUSOFT EYELID CLEANSING EX) Apply 1 application  topically as needed (dry eyes).   Yes [provider]  Glucosamine-Chondroitin (COSAMIN DS PO) Take 1 tablet by mouth 2 (two) times daily.   Yes [provider]  loperamide (IMODIUM A-D) 2 MG tablet Take 2 mg by mouth as needed for diarrhea or loose stools.   Yes [provider]  loratadine (CLARITIN) 10 MG tablet Take 10 mg by mouth daily as needed for allergies.    Yes [provider]  metoprolol tartrate (LOPRESSOR) 25 MG tablet TAKE 1 TABLET BY MOUTH TWICE DAILY Patient taking differently: Take 25 mg by mouth 2 (two) times daily. 06/12/21  Yes [name], [name], MD  nitrofurantoin, macrocrystal-monohydrate, (MACROBID) 100 MG capsule Take 100 mg by mouth at bedtime. 05/29/20  Yes [provider]  ondansetron (ZOFRAN) 4 MG tablet Take 1 tablet (4 mg total) by mouth every 8 (eight) hours as needed for nausea or vomiting. 07/07/16  Yes [name], MD  Polyethyl Glycol-Propyl Glycol (SYSTANE OP) Place 1 drop into both eyes 2  (two) times daily as needed (for dry eyes).    Yes [provider]  pravastatin (PRAVACHOL) 40 MG tablet TAKE 1 TABLET(40 MG) BY MOUTH DAILY Patient taking differently: Take 40 mg by mouth daily. 12/10/21  Yes [name], MD  Probiotic Product (PROBIOTIC DAILY) CAPS Take 2 capsules by mouth daily.   Yes [provider]  influenza vaccine adjuvanted (FLUAD QUADRIVALENT) 0.5 ML injection Inject into the muscle. 01/18/22   Carlyle Basques, MD    Physical Exam: Vitals:   01/19/22 2045 01/19/22 2100 01/19/22 2130 01/19/22 2200  BP:  124/87 109/86 (!) 137/91  Pulse: (!) 130 (!) 115 (!) 130 (!) 124  Resp: 20 (!) '24 19 18  '$ Temp:      TempSrc:      SpO2: 94% 94% 97% 96%    Physical Exam Vitals reviewed.  Constitutional:      General: She  is not in acute distress. HENT:     Head: Normocephalic and atraumatic.     Mouth/Throat:     Mouth: Mucous membranes are dry.  Eyes:     Extraocular Movements: Extraocular movements intact.  Cardiovascular:     Rate and Rhythm: Tachycardia present. Rhythm irregular.     Pulses: Normal pulses.  Pulmonary:     Effort: Pulmonary effort is normal. No respiratory distress.     Breath sounds: Normal breath sounds. No wheezing or rales.  Abdominal:     General: Bowel sounds are normal. There is no distension.     Palpations: Abdomen is soft.     Tenderness: There is no abdominal tenderness.  Musculoskeletal:        General: No swelling or tenderness.     Cervical back: Normal range of motion.  Skin:    General: Skin is warm and dry.  Neurological:     General: No focal deficit present.     Mental Status: She is alert and oriented to person, place, and time.     Cranial Nerves: No cranial nerve deficit.     Sensory: No sensory deficit.     Motor: No weakness.     Labs on Admission: I have personally reviewed following labs and imaging studies  CBC: Recent Labs  Lab 01/19/22 1946  WBC 8.9  NEUTROABS 6.4  HGB 15.9*  HCT  47.7*  MCV 92.4  PLT 983   Basic Metabolic Panel: Recent Labs  Lab 01/19/22 1946  NA 138  K 4.0  CL 103  CO2 21*  GLUCOSE 120*  BUN 14  CREATININE 0.85  CALCIUM 9.9  MG 2.1   GFR: CrCl cannot be calculated (Unknown ideal weight.). Liver Function Tests: Recent Labs  Lab 01/19/22 1946  AST 30  ALT 18  ALKPHOS 61  BILITOT 0.6  PROT 8.5*  ALBUMIN 4.6   No results for input(s): "LIPASE", "AMYLASE" in the last 168 hours. No results for input(s): "AMMONIA" in the last 168 hours. Coagulation Profile: No results for input(s): "INR", "PROTIME" in the last 168 hours. Cardiac Enzymes: No results for input(s): "CKTOTAL", "CKMB", "CKMBINDEX", "TROPONINI" in the last 168 hours. BNP (last 3 results) No results for input(s): "PROBNP" in the last 8760 hours. HbA1C: No results for input(s): "HGBA1C" in the last 72 hours. CBG: No results for input(s): "GLUCAP" in the last 168 hours. Lipid Profile: No results for input(s): "CHOL", "HDL", "LDLCALC", "TRIG", "CHOLHDL", "LDLDIRECT" in the last 72 hours. Thyroid Function Tests: Recent Labs    01/19/22 1846 01/19/22 1946  TSH 2.428  --   FREET4  --  0.84   Anemia Panel: No results for input(s): "VITAMINB12", "FOLATE", "FERRITIN", "TIBC", "IRON", "RETICCTPCT" in the last 72 hours. Urine analysis:    Component Value Date/Time   COLORURINE YELLOW 01/19/2022 1946   APPEARANCEUR HAZY (A) 01/19/2022 1946   LABSPEC 1.008 01/19/2022 1946   LABSPEC 1.025 05/07/2020 1505   PHURINE 6.0 01/19/2022 1946   GLUCOSEU NEGATIVE 01/19/2022 1946   HGBUR NEGATIVE 01/19/2022 1946   BILIRUBINUR NEGATIVE 01/19/2022 1946   BILIRUBINUR negative 05/07/2020 1505   KETONESUR NEGATIVE 01/19/2022 1946   PROTEINUR NEGATIVE 01/19/2022 1946   UROBILINOGEN 0.2 08/26/2012 1149   NITRITE POSITIVE (A) 01/19/2022 1946   LEUKOCYTESUR MODERATE (A) 01/19/2022 1946    Radiological Exams on Admission: DG Chest Portable 1 View  Result Date: 01/19/2022 CLINICAL  DATA:  Atrial fibrillation.  Operative ventricular rate. EXAM: PORTABLE CHEST 1 VIEW COMPARISON:  AP  chest 12/27/2019, chest two views 08/16/2016 FINDINGS: Cardiac silhouette is again mildly enlarged. Mediastinal contours are within normal limits. Moderate calcification within the aortic arch. Increased lucencies within the upper lungs again suggesting chronic emphysematous changes. No pleural effusion or pneumothorax. Moderate multilevel degenerative disc changes of the thoracic spine. IMPRESSION: 1. No active disease. 2. Chronic emphysematous changes of the upper lungs. Electronically Signed   By: Yvonne Kendall M.D.   On: 01/19/2022 19:38    EKG: Independently reviewed.  A-fib with RVR, new ST depressions in the inferior and lateral leads, PVCs.  Assessment and Plan  Paroxysmal A-fib with RVR Likely precipitated by infection and dehydration.  Received flu shot yesterday, question whether this is playing a role.  Patient reports compliance with her home medications.  Potassium and magnesium normal.  TSH and free T4 normal.  PE less likely given no hypoxia. -Cardiac monitoring -Cardizem gtt -Continue gentle IV fluid hydration -Continue Eliquis -Check D-dimer -Previous echo from 2018 showing EF 50 to 56%, grade 1 diastolic dysfunction, moderate MR.  Repeat echo ordered. -EKG showing new ST depressions in inferior and lateral leads.  Patient is not endorsing chest pain.  Check troponin.  UTI Patient with history of recurrent UTIs on chronic Macrobid therapy.  She is endorsing urinary symptoms.  UA showing positive nitrite, moderate leukocytes, and microscopy with 21-50 WBCs and many bacteria.  Tachycardic but does not meet any other SIRS criteria. -Continue cefepime as previous urine culture grew E. coli resistant to ceftriaxone -Urine culture  Generalized weakness/physical deconditioning Likely due to conditions listed above.  No falls reported. -PT/OT eval -Fall precautions  History of  TIA -Continue Eliquis and pravastatin  Hypertension -Currently on Cardizem gtt and normotensive -Monitor blood pressure closely  Hyperlipidemia -Continue pravastatin  Anxiety -Continue home Xanax prn  DVT prophylaxis: Eliquis Code Status: DNR (discussed with the patient) Family Communication: No family available at this time. Level of care: Progressive Care Unit Admission status: It is my clinical opinion that referral for OBSERVATION is reasonable and necessary in this patient based on the above information provided. The aforementioned taken together are felt to place the patient at high risk for further clinical deterioration. However, it is anticipated that the patient may be medically stable for discharge from the hospital within 24 to 48 hours.   Shela Leff MD [facility]  If 7PM-7AM, please contact night-coverage [email or web address]  01/19/2022, 10:25 PM

## 2022-01-19 NOTE — ED Provider Notes (Signed)
[facility] Provider Note   CSN: [id number] Arrival date & time: 01/19/22  1832     History  No chief complaint on file.   [name] is a 88 y.o. female.  With PMH of paroxysmal A-fib on Eliquis, HTN, TIA, recurrent UTI on chronic Macrobid who presents with episode of generalized fatigue and weakness and difficulty standing up from the couch today.  She was found to be in A-fib RVR but otherwise no complaints and hemodynamically stable with EMS.  Patient said her friend came over to visit her today in the middle of the afternoon when she felt too weak to stand up from her chair so she called EMS to come help her.  She has been able to stand since but has just felt generally fatigued and weak.  She does note this is in the setting of increased dysuria and polyuria.  She has had recurrent UTIs on chronic Macrobid but has also had resistant UTIs.  She has had no fevers, no chest pain, no shortness of breath, no vomiting or diarrhea.  She does note some mild decreased p.o. intake.  She denies any localized weakness, numbness or tingling, no slurred speech.  Of note, she just got flu shot yesterday.  HPI     Home Medications Prior to Admission medications   Medication Sig Start Date End Date Taking? Authorizing Provider  acetaminophen (TYLENOL) 500 MG tablet Take 500-1,000 mg by mouth every 6 (six) hours as needed (for headache/pain.).   Yes [provider]  ALPRAZolam (XANAX) 0.25 MG tablet TAKE 1 TABLET(0.25 MG) BY MOUTH TWICE DAILY AS NEEDED FOR ANXIETY Patient taking differently: Take 0.25 mg by mouth 2 (two) times daily as needed for anxiety. 08/28/20  Yes [name], MD  amLODipine (NORVASC) 5 MG tablet Take 1 tablet (5 mg total) by mouth daily. 12/14/21 12/14/22 Yes [name], [name], MD  CALCIUM CARBONATE-VIT D-MIN PO Take 2 tablets by mouth 3 (three) times a week.    Yes [provider]  CRANBERRY PO Take 1 tablet by mouth  daily.   Yes [provider]  ELIQUIS 5 MG TABS tablet TAKE 1 TABLET BY MOUTH TWICE DAILY Patient taking differently: Take 5 mg by mouth 2 (two) times daily. 12/07/21  Yes [name], [name], MD  estradiol (ESTRACE) 0.1 MG/GM vaginal cream Place 1 Applicatorful vaginally 3 (three) times a week. 08/22/20  Yes [provider]  Eyelid Cleansers (OCUSOFT EYELID CLEANSING EX) Apply 1 application  topically as needed (dry eyes).   Yes [provider]  Glucosamine-Chondroitin (COSAMIN DS PO) Take 1 tablet by mouth 2 (two) times daily.   Yes [provider]  loperamide (IMODIUM A-D) 2 MG tablet Take 2 mg by mouth as needed for diarrhea or loose stools.   Yes [provider]  loratadine (CLARITIN) 10 MG tablet Take 10 mg by mouth daily as needed for allergies.    Yes [provider]  metoprolol tartrate (LOPRESSOR) 25 MG tablet TAKE 1 TABLET BY MOUTH TWICE DAILY Patient taking differently: Take 25 mg by mouth 2 (two) times daily. 06/12/21  Yes [name], [name], MD  nitrofurantoin, macrocrystal-monohydrate, (MACROBID) 100 MG capsule Take 100 mg by mouth at bedtime. 05/29/20  Yes [provider]  ondansetron (ZOFRAN) 4 MG tablet Take 1 tablet (4 mg total) by mouth every 8 (eight) hours as needed for nausea or vomiting. 07/07/16  Yes [name], MD  Polyethyl Glycol-Propyl Glycol (SYSTANE  OP) Place 1 drop into both eyes 2 (two) times daily as needed (for dry eyes).    Yes [provider]  pravastatin (PRAVACHOL) 40 MG tablet TAKE 1 TABLET(40 MG) BY MOUTH DAILY Patient taking differently: Take 40 mg by mouth daily. 12/10/21  Yes [name], MD  Probiotic Product (PROBIOTIC DAILY) CAPS Take 2 capsules by mouth daily.   Yes [provider]  influenza vaccine adjuvanted (FLUAD QUADRIVALENT) 0.5 ML injection Inject into the muscle. 01/18/22   Carlyle Basques, MD      Allergies    Lactose, Lisinopril, and Bactrim  [sulfamethoxazole-trimethoprim]    Review of Systems   Review of Systems  Physical Exam Updated Vital Signs BP (!) 137/91   Pulse (!) 124   Temp 98.6 F (37 C) (Oral)   Resp 18   SpO2 96%  Physical Exam Constitutional: Alert and oriented. Well appearing and in no distress.  Pleasant female. Eyes: Conjunctivae are normal. ENT      Head: Normocephalic and atraumatic.      Nose: No congestion.      Mouth/Throat: Mucous membranes are moist.      Neck: No stridor. Cardiovascular: S1, S2, irregularly irregular rhythm and tachycardic, warm and dry Respiratory: Normal respiratory effort. Breath sounds are normal.  O2 sat 94-98 on RA Gastrointestinal: Soft and nontender.  No CVA tenderness Musculoskeletal: Normal range of motion in all extremities. Bilateral nonpitting edema of the lower extremities that is equal and nontender Neurologic: Normal speech and language.  Pupils equal round and reactive to light.  EOMI.  No facial droop.  Sensation grossly intact.  Equal strength of bilateral upper and lower extremities 5 out of 5.  No gross focal neurologic deficits are appreciated. Skin: Skin is warm, dry and intact. No rash noted. Psychiatric: Mood and affect are normal. Speech and behavior are normal.  ED Results / Procedures / Treatments   Labs (all labs ordered are listed, but only abnormal results are displayed) Labs Reviewed  COMPREHENSIVE METABOLIC PANEL - Abnormal; Notable for the following components:      Result Value   CO2 21 (*)    Glucose, Bld 120 (*)    Total Protein 8.5 (*)    All other components within normal limits  CBC WITH DIFFERENTIAL/PLATELET - Abnormal; Notable for the following components:   RBC 5.16 (*)    Hemoglobin 15.9 (*)    HCT 47.7 (*)    All other components within normal limits  URINALYSIS, ROUTINE W REFLEX MICROSCOPIC - Abnormal; Notable for the following components:   APPearance HAZY (*)    Nitrite POSITIVE (*)    Leukocytes,Ua MODERATE (*)     Bacteria, UA MANY (*)    Non Squamous Epithelial 0-5 (*)    All other components within normal limits  MAGNESIUM  TSH  T4, FREE    EKG EKG Interpretation  Date/Time:  Tuesday January 19 2022 18:52:06 EDT Ventricular Rate:  134 PR Interval:    QRS Duration: 77 QT Interval:  325 QTC Calculation: 486 R Axis:   -34 Text Interpretation: Atrial fibrillation Ventricular premature complex Left axis deviation Anterior infarct, old ST depression, probably rate related St depression inferior and lateral leads Confirmed by Georgina Snell [id number]) on 01/19/2022 6:57:04 PM  Radiology DG Chest Portable 1 View  Result Date: 01/19/2022 CLINICAL DATA:  Atrial fibrillation.  Operative ventricular rate. EXAM: PORTABLE CHEST 1 VIEW COMPARISON:  AP chest 12/27/2019, chest two views 08/16/2016 FINDINGS: Cardiac silhouette is again mildly enlarged.  Mediastinal contours are within normal limits. Moderate calcification within the aortic arch. Increased lucencies within the upper lungs again suggesting chronic emphysematous changes. No pleural effusion or pneumothorax. Moderate multilevel degenerative disc changes of the thoracic spine. IMPRESSION: 1. No active disease. 2. Chronic emphysematous changes of the upper lungs. Electronically Signed   By: Yvonne Kendall M.D.   On: 01/19/2022 19:38    Procedures Procedures  Remain on constant cardiac monitoring, A-fib RVR.  Medications Ordered in ED Medications  metoprolol tartrate (LOPRESSOR) injection 5 mg (0 mg Intravenous Hold 01/19/22 2150)  diltiazem (CARDIZEM) 125 mg in dextrose 5% 125 mL (1 mg/mL) infusion (has no administration in time range)  lactated ringers bolus PEDS (0 mLs Intravenous Stopped 01/19/22 2043)  metoprolol tartrate (LOPRESSOR) injection 5 mg (5 mg Intravenous Given 01/19/22 1938)  metoprolol tartrate (LOPRESSOR) tablet 25 mg (25 mg Oral Given 01/19/22 1939)  acetaminophen (TYLENOL) tablet 1,000 mg (1,000 mg Oral Given 01/19/22 1958)   ceFEPIme (MAXIPIME) 2 g in sodium chloride 0.9 % 100 mL IVPB (0 g Intravenous Stopped 01/19/22 2133)  metoprolol tartrate (LOPRESSOR) injection 5 mg (5 mg Intravenous Given 01/19/22 2049)  lactated ringers bolus PEDS (0 mLs Intravenous Stopped 01/19/22 2214)  metoprolol tartrate (LOPRESSOR) injection 2.5 mg ( Intravenous Not Given 01/19/22 2221)    ED Course/ Medical Decision Making/ A&P Clinical Course as of 01/19/22 [place] Jan 19, 2022  2224 Discussed case with hospitalist who requests Cardizem drip as she is still persistently A-fib RVR from 110s to 120s to 130s.  She is maintaining her blood pressure.  We will put in Cardizem drip and admit to hospitalist on stepdown. [VB]    Clinical Course User Index [VB] Elgie Congo, MD                           Medical Decision Making TANASHA MENEES is a 88 y.o. female.  With PMH of paroxysmal A-fib on Eliquis, HTN, TIA, recurrent UTI on chronic Macrobid who presents with episode of generalized fatigue and weakness and difficulty standing up from the couch today.    Patient presented A-fib RVR with known paroxysmal A-fib on both metoprolol and diltiazem.  She has been compliant with her medicines.  Her EKG showed A-fib RVR with nonspecific ST depression but denies any cardiopulmonary complaints, no concern for ACS.  Suspect likely secondary to mild dehydration and UTI appreciated on work-up today that have contributed to presentation today.  Also consider her recent flu shot.  She has no focal weakness and overall normal neurologic exam, no concern for CVA.  Her thyroid levels are normal.  She has no acute electrolyte abnormalities.  Creatinine 0.85 within normal limits.  Hemoglobin 15.9 likely some hemoconcentration from decreased p.o. intake.  UA with moderate leukocyte esterase 21-50 WBCs positive nitrite and many bacteria.  Trialing patient with IV metoprolol 5 mg x 3.  Also given patient IV fluids and IV cefepime for UTI as previous culture  E. coli and resistant to ceftriaxone.  If unable to rate control, likely plan for admission.  Amount and/or Complexity of Data Reviewed Labs: ordered. Radiology: ordered.  Risk OTC drugs. Prescription drug management. Decision regarding hospitalization.    Final Clinical Impression(s) / ED Diagnoses Final diagnoses:  Atrial fibrillation with RVR ([place])  Urinary tract infection without hematuria, site unspecified  Weakness    Rx / DC Orders ED Discharge Orders     None  Elgie Congo, MD 01/19/22 2225

## 2022-01-19 NOTE — Progress Notes (Signed)
Pharmacy Antibiotic Note  [name] is a 88 y.o. female admitted on 01/19/2022 with UTI.  Pharmacy has been consulted for cefepime dosing.  Pt has h/o ESBL E.coli UTI in 2021.  Plan: Cefepime 2g IV Q24H.  Height: '5\' 5"'$  (165.1 cm) Weight: 67.9 kg (149 lb 11.1 oz) IBW/kg (Calculated) : 57  Temp (24hrs), Avg:98.9 F (37.2 C), Min:98.6 F (37 C), Max:99.2 F (37.3 C)  Recent Labs  Lab 01/19/22 1946  WBC 8.9  CREATININE 0.85    Estimated Creatinine Clearance: 40.4 mL/min (by C-G formula based on SCr of 0.85 mg/dL).    Allergies  Allergen Reactions   Lactose Other (See Comments)    Dairy Sensitivity  Dairy Sensitivity     Lisinopril Other (See Comments)    DIZZY   Bactrim [Sulfamethoxazole-Trimethoprim] Rash    Thank you for allowing pharmacy to be a part of this patient's care.  Wynona Neat, PharmD, BCPS  01/19/2022 11:44 PM

## 2022-01-19 NOTE — ED Triage Notes (Signed)
Pt BIB [facility] for weakness and afib/rvr.  The pt tried to get off the couch and go to the door but her legs were too weak to get up.  EMS discovered afib/rvr while assessing.  Pt takes metoprolol and cardizem PO at home. Pt has hx of paroxysmal afib.   EMS VS 150/84 94% HR 80-150, CBG 130  Pt also has hx of UTI's and takes macrobid prophylactically.  She is currently experiencing freq, and burning.

## 2022-01-20 ENCOUNTER — Inpatient Hospital Stay (HOSPITAL_COMMUNITY): Payer: Medicare Other

## 2022-01-20 ENCOUNTER — Observation Stay (HOSPITAL_COMMUNITY): Payer: Medicare Other

## 2022-01-20 DIAGNOSIS — Z66 Do not resuscitate: Secondary | ICD-10-CM | POA: Diagnosis present

## 2022-01-20 DIAGNOSIS — Z90710 Acquired absence of both cervix and uterus: Secondary | ICD-10-CM | POA: Diagnosis not present

## 2022-01-20 DIAGNOSIS — E860 Dehydration: Secondary | ICD-10-CM | POA: Diagnosis present

## 2022-01-20 DIAGNOSIS — Z79890 Hormone replacement therapy (postmenopausal): Secondary | ICD-10-CM | POA: Diagnosis not present

## 2022-01-20 DIAGNOSIS — Z1624 Resistance to multiple antibiotics: Secondary | ICD-10-CM | POA: Diagnosis present

## 2022-01-20 DIAGNOSIS — Z8249 Family history of ischemic heart disease and other diseases of the circulatory system: Secondary | ICD-10-CM | POA: Diagnosis not present

## 2022-01-20 DIAGNOSIS — K521 Toxic gastroenteritis and colitis: Secondary | ICD-10-CM | POA: Diagnosis present

## 2022-01-20 DIAGNOSIS — N390 Urinary tract infection, site not specified: Secondary | ICD-10-CM | POA: Diagnosis present

## 2022-01-20 DIAGNOSIS — N289 Disorder of kidney and ureter, unspecified: Secondary | ICD-10-CM | POA: Diagnosis present

## 2022-01-20 DIAGNOSIS — Z792 Long term (current) use of antibiotics: Secondary | ICD-10-CM | POA: Diagnosis not present

## 2022-01-20 DIAGNOSIS — B9620 Unspecified Escherichia coli [E. coli] as the cause of diseases classified elsewhere: Secondary | ICD-10-CM | POA: Diagnosis present

## 2022-01-20 DIAGNOSIS — Z7901 Long term (current) use of anticoagulants: Secondary | ICD-10-CM | POA: Diagnosis not present

## 2022-01-20 DIAGNOSIS — I77810 Thoracic aortic ectasia: Secondary | ICD-10-CM | POA: Diagnosis present

## 2022-01-20 DIAGNOSIS — I480 Paroxysmal atrial fibrillation: Secondary | ICD-10-CM | POA: Diagnosis present

## 2022-01-20 DIAGNOSIS — I4891 Unspecified atrial fibrillation: Secondary | ICD-10-CM | POA: Diagnosis not present

## 2022-01-20 DIAGNOSIS — I059 Rheumatic mitral valve disease, unspecified: Secondary | ICD-10-CM | POA: Diagnosis present

## 2022-01-20 DIAGNOSIS — Z79899 Other long term (current) drug therapy: Secondary | ICD-10-CM | POA: Diagnosis not present

## 2022-01-20 DIAGNOSIS — R55 Syncope and collapse: Secondary | ICD-10-CM | POA: Diagnosis not present

## 2022-01-20 DIAGNOSIS — F419 Anxiety disorder, unspecified: Secondary | ICD-10-CM | POA: Diagnosis present

## 2022-01-20 DIAGNOSIS — I10 Essential (primary) hypertension: Secondary | ICD-10-CM | POA: Diagnosis present

## 2022-01-20 DIAGNOSIS — I2489 Other forms of acute ischemic heart disease: Secondary | ICD-10-CM | POA: Diagnosis present

## 2022-01-20 DIAGNOSIS — E785 Hyperlipidemia, unspecified: Secondary | ICD-10-CM | POA: Diagnosis present

## 2022-01-20 DIAGNOSIS — Z87440 Personal history of urinary (tract) infections: Secondary | ICD-10-CM | POA: Diagnosis not present

## 2022-01-20 DIAGNOSIS — T3695XA Adverse effect of unspecified systemic antibiotic, initial encounter: Secondary | ICD-10-CM | POA: Diagnosis present

## 2022-01-20 DIAGNOSIS — N3000 Acute cystitis without hematuria: Secondary | ICD-10-CM | POA: Diagnosis not present

## 2022-01-20 DIAGNOSIS — I951 Orthostatic hypotension: Secondary | ICD-10-CM | POA: Diagnosis not present

## 2022-01-20 DIAGNOSIS — R531 Weakness: Secondary | ICD-10-CM | POA: Diagnosis present

## 2022-01-20 DIAGNOSIS — Z8673 Personal history of transient ischemic attack (TIA), and cerebral infarction without residual deficits: Secondary | ICD-10-CM | POA: Diagnosis not present

## 2022-01-20 LAB — ECHOCARDIOGRAM COMPLETE
AR max vel: 2.78 cm2
AV Area VTI: 3.25 cm2
AV Area mean vel: 2.82 cm2
AV Mean grad: 2.3 mmHg
AV Peak grad: 3.9 mmHg
Ao pk vel: 0.98 m/s
Area-P 1/2: 3.93 cm2
Calc EF: 52.1 %
Height: 65 in
MV M vel: 3.82 m/s
MV Peak grad: 58.2 mmHg
P 1/2 time: 577 msec
S' Lateral: 3 cm
Single Plane A2C EF: 60.2 %
Single Plane A4C EF: 44.8 %
Weight: 2395.08 oz

## 2022-01-20 LAB — MRSA NEXT GEN BY PCR, NASAL: MRSA by PCR Next Gen: NOT DETECTED

## 2022-01-20 LAB — TROPONIN I (HIGH SENSITIVITY)
Troponin I (High Sensitivity): 25 ng/L — ABNORMAL HIGH (ref <18)
Troponin I (High Sensitivity): 35 ng/L — ABNORMAL HIGH (ref <18)

## 2022-01-20 LAB — D-DIMER, QUANTITATIVE: D-Dimer, Quant: 0.31 ug/mL-FEU (ref 0.00–0.50)

## 2022-01-20 MED ORDER — METOPROLOL TARTRATE 25 MG PO TABS
25.0000 mg | ORAL_TABLET | Freq: Two times a day (BID) | ORAL | Status: DC
  Administered 2022-01-20: 25 mg via ORAL
  Filled 2022-01-20: qty 1

## 2022-01-20 MED ORDER — METOPROLOL TARTRATE 25 MG PO TABS
25.0000 mg | ORAL_TABLET | Freq: Two times a day (BID) | ORAL | Status: DC
  Administered 2022-01-20: 25 mg via ORAL
  Filled 2022-01-20 (×2): qty 1

## 2022-01-20 MED ORDER — DILTIAZEM HCL ER COATED BEADS 120 MG PO CP24
120.0000 mg | ORAL_CAPSULE | Freq: Every day | ORAL | Status: DC
  Administered 2022-01-20: 120 mg via ORAL
  Filled 2022-01-20: qty 1

## 2022-01-20 MED ORDER — ESTRADIOL 0.1 MG/GM VA CREA
1.0000 | TOPICAL_CREAM | VAGINAL | Status: DC
  Administered 2022-01-22 – 2022-01-25 (×2): 1 via VAGINAL
  Filled 2022-01-20 (×2): qty 42.5

## 2022-01-20 NOTE — Progress Notes (Signed)
PT Cancellation Note  Patient Details Name: BROCHA GILLIAM MRN: [id number] DOB: [birth year]   Cancelled Treatment:    Reason Eval/Treat Not Completed: Other (comment).  Gone to procedure, retry at another time.   Ramond Dial 01/20/2022, 2:28 PM  Mee Hives, PT PhD Acute Rehab Dept. Number: [place] and [place]

## 2022-01-20 NOTE — Progress Notes (Signed)
PT Cancellation Note  Patient Details Name: [name] MRN: [id number] DOB: 12/29/1930   Cancelled Treatment:    Reason Eval/Treat Not Completed: Other (comment).  Pt is just arriving to the unit and cannot be seen yet.  Try again at another time.   Ramond Dial 01/20/2022, 11:02 AM  Mee Hives, PT PhD Acute Rehab Dept. Number: [place] and [place]

## 2022-01-20 NOTE — Hospital Course (Addendum)
PMH of PAF on Eliquis, TIA, HTN, HLD, recurrent UTI.  Presents with generalized weakness.  Found to have A-fib with RVR.  Initially was on Cardizem drip.  Had minimally elevated troponin.  On IV cefepime for possible UTI.  PT OT consulted. Due to persistent orthostatics and A-fib/flutter cardiology was consulted. As of 10/8 orthostatic have improved to normal and patient has returned to normal sinus rhythm.  Social worker consulted for SNF placement.

## 2022-01-20 NOTE — Evaluation (Signed)
Occupational Therapy Evaluation Patient Details Name: [name] MRN: [id number] DOB: 01/25/1931 Today's Date: 01/20/2022   History of Present Illness Pt is an 88 y/o female presented with progressive weakness and confusion in setting of likely UTI. Pt also noted with a fib with RVR. PMH: HTN, hiatal hernia, migraine, a fib, TIA, PNA   Clinical Impression   PTA, pt lives at [place], typically Independent with ADLs and mobility without AD, able to manage basic meal prep in apartment. Pt presents now with deficits in cognition, sitting balance and overall strength. Pt able to answer orientation questions but restless and impulsive with movements throughout. Pt requires Mod A for bed mobility though noted with posterior bias upon sitting and unable to safely attempt standing from stretcher height this AM. Pt requires Min A for UB ADL and Max-Total A for LB ADLs d/t deficits. With current presentation, would recommend SNF rehab at [facility] at [place]. However, if cognition clears and pt able to improve mobility, could progress to [place]. Will continue to follow acutely.    HR 70s   Recommendations for follow up therapy are one component of a multi-disciplinary discharge planning process, led by the attending physician.  Recommendations may be updated based on patient status, additional functional criteria and insurance authorization.   Follow Up Recommendations  Skilled nursing-short term rehab (<3 hours/day) (if cognition improves, may be able to progress to [facility] at [facility])    Assistance Recommended at Discharge Frequent or constant Supervision/Assistance  Patient can return home with the following A lot of help with walking and/or transfers;A lot of help with bathing/dressing/bathroom    Functional Status Assessment  Patient has had a recent decline in their functional status and demonstrates the ability to make significant improvements in function in a reasonable and predictable amount  of time.  Equipment Recommendations  None recommended by OT    Recommendations for Other Services       Precautions / Restrictions Precautions Precautions: Fall Restrictions Weight Bearing Restrictions: No      Mobility Bed Mobility Overal bed mobility: Needs Assistance Bed Mobility: Supine to Sit, Sit to Supine     Supine to sit: Mod assist, HOB elevated Sit to supine: Mod assist   General bed mobility comments: Pt able to assist in pulling self forward and advancing LLE to EOB. Increased assist needed with LLE and pt noted with posterior bias sitting on stretcher - cued to lean forward but posing safety concern so assisted back on to stretcher    Transfers                   General transfer comment: unable to safely attempt from stretcher with pt posterior bias      Balance Overall balance assessment: Needs assistance Sitting-balance support: No upper extremity supported, Feet supported Sitting balance-Leahy Scale: Poor Sitting balance - Comments: posterior bias sitting on edge of stretcher. Overall Min A to correct/maintain                                   ADL either performed or assessed with clinical judgement   ADL Overall ADL's : Needs assistance/impaired Eating/Feeding: Independent   Grooming: Min guard;Sitting   Upper Body Bathing: Minimal assistance;Sitting   Lower Body Bathing: Maximal assistance;Bed level   Upper Body Dressing : Minimal assistance;Sitting   Lower Body Dressing: Maximal assistance;Bed level       Toileting- Clothing Manipulation  and Hygiene: Total assistance;Bed level         General ADL Comments: Pt limited by confusion, poor attention to tasks and posterior lean sitting on edge of stretcher     Vision Ability to See in Adequate Light: 0 Adequate Patient Visual Report: No change from baseline Vision Assessment?: No apparent visual deficits     Perception     Praxis      Pertinent Vitals/Pain  Pain Assessment Pain Assessment: No/denies pain     Hand Dominance Right   Extremity/Trunk Assessment Upper Extremity Assessment Upper Extremity Assessment: Generalized weakness   Lower Extremity Assessment Lower Extremity Assessment: Defer to PT evaluation   Cervical / Trunk Assessment Cervical / Trunk Assessment: Normal;Kyphotic   Communication Communication Communication: Other (comment) (some garbled speech)   Cognition Arousal/Alertness: Awake/alert Behavior During Therapy: Restless, Anxious, Impulsive Overall Cognitive Status: Impaired/Different from baseline Area of Impairment: Attention, Awareness, Safety/judgement, Problem solving                   Current Attention Level: Sustained     Safety/Judgement: Decreased awareness of deficits, Decreased awareness of safety Awareness: Intellectual Problem Solving: Difficulty sequencing, Requires verbal cues, Requires tactile cues General Comments: Pt able to answer orientation questions but easily internally distracted. Pt restless, fidgeting with lines and impulsively pulling self forward on stretcher.     General Comments  HR 70s, frequent urination with use of purewick during session. Daughter entering at end and able to confirm PLOF/apartment setup    Exercises     Shoulder Instructions      Home Living Family/patient expects to be discharged to:: Other (Comment) (Independent Living)                                 Additional Comments: Lives in level entry apartment in [place]. Has Rollator, shower chair in walk in shower w/ grab bars. has wheelchair      Prior Functioning/Environment Prior Level of Function : Needs assist             Mobility Comments: no use of AD typically for mobility ADLs Comments: Independent with ADLs, simple meal prep in apartment or walking to dining [place] for meals. Daughter provides transportation assist or picking up groceries as needed. Pt does not drive         OT Problem List: Decreased strength;Decreased activity tolerance;Impaired balance (sitting and/or standing);Decreased cognition;Decreased safety awareness;Decreased knowledge of use of DME or AE      OT Treatment/Interventions: Self-care/ADL training;Therapeutic exercise;DME and/or AE instruction;Therapeutic activities;Patient/family education    OT Goals(Current goals can be found in the care plan section) Acute Rehab OT Goals Patient Stated Goal: pt would like to be able to get up and walk to bathroom OT Goal Formulation: With patient/family Time For Goal Achievement: 02/03/22 Potential to Achieve Goals: Good ADL Goals Pt Will Perform Lower Body Dressing: with modified independence;sit to/from stand Pt Will Transfer to Toilet: ambulating;with supervision Pt Will Perform Toileting - Clothing Manipulation and hygiene: sit to/from stand;sitting/lateral leans;with supervision Additional ADL Goal #1: Pt to attend to ADL/functional tasks > 5 min with no more than min verbal cues  OT Frequency: Min 2X/week    Co-evaluation              AM-PAC OT "6 Clicks" Daily Activity     Outcome Measure Help from another person eating meals?: None Help from another person taking care of personal  grooming?: A Little Help from another person toileting, which includes using toliet, bedpan, or urinal?: Total Help from another person bathing (including washing, rinsing, drying)?: A Lot Help from another person to put on and taking off regular upper body clothing?: A Little Help from another person to put on and taking off regular lower body clothing?: A Lot 6 Click Score: 15   End of Session Nurse Communication: Mobility status  Activity Tolerance: Patient tolerated treatment well Patient left: in bed;with call bell/phone within reach;with family/visitor present  OT Visit Diagnosis: Unsteadiness on feet (R26.81);Muscle weakness (generalized) (M62.81);Other symptoms and signs involving  cognitive function                Time: 1009-1035 OT Time Calculation (min): 26 min Charges:  OT General Charges $OT Visit: 1 Visit OT Evaluation $OT Eval Moderate Complexity: 1 Mod  Malachy Chamber, OTR/L Acute Rehab Services Office: [phone]   Layla Maw 01/20/2022, 11:10 AM

## 2022-01-20 NOTE — Progress Notes (Signed)
PT Cancellation Note  Patient Details Name: [name] MRN: [id number] DOB: 18-Mar-1931   Cancelled Treatment:    Reason Eval/Treat Not Completed: Other (comment).  Consistent low sats currently and will retry as time and pt allow.   Ramond Dial 01/20/2022, 11:35 AM  Mee Hives, PT PhD Acute Rehab Dept. Number: [place] and [place]

## 2022-01-20 NOTE — Progress Notes (Addendum)
  Progress Note Patient: [name] [id number]:[id number] DOB: 18-Feb-1931 DOA: 01/19/2022  DOS: the patient was seen and examined on 01/20/2022  Brief hospital course: PMH of PAF on Eliquis, TIA, HTN, HLD, recurrent UTI.  Presents with generalized weakness.  Found to have A-fib with RVR.  Currently on Cardizem drip.  Minimally elevated troponin.  On IV cefepime for possible UTI.  PT OT consulted. Assessment and Plan: Paroxysmal atrial fibrillation with rapid ventricular response ([place]) Presents with generalized weakness. Found to have A-fib with RVR in the ER. Does not feel any symptoms when she is in RVR. Continue Eliquis which is chronic. Appears to have some flutter waves on the telemetry. Echocardiogram showed preserved EF 50%.  Global hypokinesis. Mild mitral regurg seen. Patient is now off of the Cardizem drip. Patient was given Cardizem 120 mg.  Due to bradycardia with heart rate in 60s I will be holding off on that medication. Continue Lopressor with holding parameters as well. We will decide tomorrow whether to reduce Cardizem or not.  MDR E. coli UTI. Patient has prior history of repeat recurrent UTI and is on chronic Macrobid. Appears to have possible UTI with positive nitrates. Currently on IV cefepime based on prior sensitivities. Monitor urine cultures.   Hyperlipidemia with target LDL less than 130 Continue statin.    History of TIA (transient ischemic attack) Continue Eliquis. Currently no evidence of stroke or TIA.    Essential hypertension, benign Patient is on Norvasc.  Might consider transitioning to Cardizem.    Generalized weakness Due to UTI. Monitor.    Anxiety Continue current regimen.  Subjective: No nausea no vomiting no fever no chills.  Continues to have some fatigue.  No burning urination.  Physical Exam: Vitals:   01/20/22 0624 01/20/22 0645 01/20/22 0700 01/20/22 0715  BP:  (!) 146/59 (!) 147/62 139/60  Pulse:  68 70 82  Resp:  (!) 21 (!) 21 (!)  24  Temp: 98.9 F (37.2 C)     TempSrc: Oral     SpO2:  92% 90% 92%  Weight:      Height:       General: Appear in mild distress; no visible Abnormal Neck Mass Or lumps, Conjunctiva normal Cardiovascular: S1 and S2 Present, no Murmur, Respiratory: good respiratory effort, Bilateral Air entry present and CTA, no Crackles, no wheezes Abdomen: Bowel Sound present, Non tender  Extremities: no Pedal edema Neurology: alert and oriented to place and person, no focal deficit Gait not checked due to patient safety concerns   Data Reviewed: I have Reviewed nursing notes, Vitals, and Lab results since pt's last encounter. Pertinent lab results CBC and BMP and echocardiogram I have ordered test including CBC and BMP and magnesium I have independently visualized and interpreted EKG which showed EKG: nonspecific ST and T waves changes, atrial fibrillation, rate controlled.   Family Communication: Daughter and son at bedside  Disposition: Currently remains in the hospital for IV antibiotics.  Monitor for stability of the heart rate.  Author: Berle Mull, MD 01/20/2022 7:41 AM  Please look on [email or web address] to find out who is on call.

## 2022-01-21 DIAGNOSIS — I4891 Unspecified atrial fibrillation: Secondary | ICD-10-CM | POA: Diagnosis not present

## 2022-01-21 LAB — BASIC METABOLIC PANEL
Anion gap: 10 (ref 5–15)
Anion gap: 7 (ref 5–15)
BUN: 22 mg/dL (ref 8–23)
BUN: 25 mg/dL — ABNORMAL HIGH (ref 8–23)
CO2: 23 mmol/L (ref 22–32)
CO2: 25 mmol/L (ref 22–32)
Calcium: 8.6 mg/dL — ABNORMAL LOW (ref 8.9–10.3)
Calcium: 9 mg/dL (ref 8.9–10.3)
Chloride: 101 mmol/L (ref 98–111)
Chloride: 107 mmol/L (ref 98–111)
Creatinine, Ser: 1.03 mg/dL — ABNORMAL HIGH (ref 0.44–1.00)
Creatinine, Ser: 1.03 mg/dL — ABNORMAL HIGH (ref 0.44–1.00)
GFR, Estimated: 52 mL/min — ABNORMAL LOW (ref >60)
GFR, Estimated: 52 mL/min — ABNORMAL LOW (ref >60)
Glucose, Bld: 136 mg/dL — ABNORMAL HIGH (ref 70–99)
Glucose, Bld: 126 mg/dL — ABNORMAL HIGH (ref 70–99)
Potassium: 3.5 mmol/L (ref 3.5–5.1)
Potassium: 3.6 mmol/L (ref 3.5–5.1)
Sodium: 134 mmol/L — ABNORMAL LOW (ref 135–145)
Sodium: 139 mmol/L (ref 135–145)

## 2022-01-21 LAB — CBC
HCT: 41.1 % (ref 36.0–46.0)
Hemoglobin: 13.7 g/dL (ref 12.0–15.0)
MCH: 30.2 pg (ref 26.0–34.0)
MCHC: 33.3 g/dL (ref 30.0–36.0)
MCV: 90.5 fL (ref 80.0–100.0)
Platelets: 264 10*3/uL (ref 150–400)
RBC: 4.54 MIL/uL (ref 3.87–5.11)
RDW: 13.8 % (ref 11.5–15.5)
WBC: 13.1 10*3/uL — ABNORMAL HIGH (ref 4.0–10.5)
nRBC: 0 % (ref 0.0–0.2)

## 2022-01-21 LAB — TROPONIN I (HIGH SENSITIVITY): Troponin I (High Sensitivity): 32 ng/L — ABNORMAL HIGH (ref <18)

## 2022-01-21 LAB — MAGNESIUM
Magnesium: 2.1 mg/dL (ref 1.7–2.4)
Magnesium: 2.1 mg/dL (ref 1.7–2.4)

## 2022-01-21 MED ORDER — LACTATED RINGERS IV SOLN: INTRAVENOUS | Status: AC

## 2022-01-21 MED ORDER — SACCHAROMYCES BOULARDII 250 MG PO CAPS
250.0000 mg | ORAL_CAPSULE | Freq: Two times a day (BID) | ORAL | Status: DC
  Administered 2022-01-21 – 2022-01-26 (×10): 250 mg via ORAL
  Filled 2022-01-21 (×11): qty 1

## 2022-01-21 MED ORDER — COSYNTROPIN 0.25 MG IJ SOLR
0.2500 mg | Freq: Once | INTRAMUSCULAR | Status: AC
  Administered 2022-01-22: 0.25 mg via INTRAVENOUS
  Filled 2022-01-21: qty 0.25

## 2022-01-21 MED ORDER — METOPROLOL SUCCINATE ER 25 MG PO TB24
25.0000 mg | ORAL_TABLET | Freq: Every day | ORAL | Status: DC
  Administered 2022-01-21: 25 mg via ORAL
  Filled 2022-01-21: qty 1

## 2022-01-21 NOTE — Progress Notes (Signed)
Mobility Specialist Progress Note    01/21/22 1548  Mobility  Activity Dangled on edge of bed  Activity Response Tolerated fair  $Mobility charge 1 Mobility  Level of Assistance Minimal assist, patient does 75% or more  Assistive Device Other (Comment) (HHA)  Mobility Referral Yes   Pt received in bed and agreeable to attempt pericare. Pt drowsy and once sitting EOB c/o dizziness. Returned to supine and able to complete bed mobility with minA for pericare. Left with RN present.   Lying: 106/57 (72) BP, 76 HR Sitting EOB: 75/60 (65), 83 HR  Insurance account manager

## 2022-01-21 NOTE — Progress Notes (Addendum)
Progress Note Patient: [name] QPR:[id number] DOB: 1930-09-26 DOA: 01/19/2022  DOS: the patient was seen and examined on 01/21/2022  Brief hospital course: PMH of PAF on Eliquis, TIA, HTN, HLD, recurrent UTI.  Presents with generalized weakness.  Found to have A-fib with RVR.  Was on Cardizem drip.  Minimally elevated troponin.  On IV cefepime for possible UTI.  PT OT consulted.  Assessment and Plan: Paroxysmal atrial fibrillation with rapid ventricular response ([place]) Presents with generalized weakness. Found to have A-fib with RVR in the ER. Does not feel any symptoms when she is in RVR. Continue Eliquis which is chronic. Appears to have some flutter waves on the telemetry. Echocardiogram showed preserved EF 50%.  Global hypokinesis. Mild mitral regurg seen. Patient is now off of the Cardizem drip. Patient is on Lopressor 25 mg twice daily.  Currently switching to Toprol-XL 25 mg daily only.  Holding that as well in the setting of syncope.  MDR E. coli UTI. Patient has prior history of repeat recurrent UTI and is on chronic Macrobid. Appears to have possible UTI with positive nitrates. Currently on IV cefepime based on prior sensitivities. No urine cultures done on admission. We will continue IV antibiotic for today and switch to oral tomorrow.  Syncopal event. Patient had a near syncopal event on 10/5. This was in the setting of having large BM. Patient was on telemetry and no significant events seen on telemetry. Orthostatic vitals significantly positive. We will continue with IV fluids. Check cosyntropin stim test tomorrow.  Most likely will require midodrine.  Hyperlipidemia with target LDL less than 130 Continue statin.  History of TIA (transient ischemic attack) Continue Eliquis. Currently no evidence of stroke or TIA.  Essential hypertension, benign Patient is on Norvasc.  Currently holding. Might consider transitioning to Cardizem.  Generalized weakness Due  to UTI. Monitor.  Anxiety Continue current regimen.  Mild renal dysfunction. Hypotension. Baseline serum creatinine around 0.8.  Currently serum creatinine 1.03.  In the setting of low blood pressure I will initiate IV fluids..  Subjective: Patient loose BM.  No abdominal pain.  No nausea no vomiting.  At the time a second BM patient actually had a passing out event.  After that patient had dizziness while working with mobility tach and blood pressure dropped from 993 systolic to 70.  No focal deficit at the time of my evaluation.  No other acute complaints.  Physical Exam: Vitals:   01/21/22 1235 01/21/22 1548 01/21/22 1725 01/21/22 1900  BP: 119/66 (!) 148/74    Pulse: 85   78  Resp: 20 20    Temp: 98.1 F (36.7 C)  98.3 F (36.8 C) 98.1 F (36.7 C)  TempSrc: Oral  Oral Oral  SpO2: 98%   97%  Weight:      Height:       General: Appear in mild distress; no visible Abnormal Neck Mass Or lumps, Conjunctiva normal Cardiovascular: S1 and S2 Present, aortic systolic Murmur, Respiratory: good respiratory effort, Bilateral Air entry present and CTA, no Crackles, no wheezes Abdomen: Bowel Sound present, Non tender Extremities: no Pedal edema Neurology: alert and oriented to Self, Place and time.  Data Reviewed: I have Reviewed nursing notes, Vitals, and Lab results since pt's last encounter. Pertinent lab results CBC and BMP I have ordered test including CBC and BMP    Family Communication: No one at bedside.  We will reach out to family tomorrow.  Disposition: Remains on IV antibiotics and IV fluids.  Syncopal  event requiring further work-up.  Author: Berle Mull, MD 01/21/2022 7:45 PM  Please look on [email or web address] to find out who is on call.

## 2022-01-21 NOTE — Evaluation (Signed)
Physical Therapy Evaluation Patient Details Name: [name] MRN: [id number] DOB: 02/05/1931 Today's Date: 01/21/2022  History of Present Illness  Pt is an 88 y/o female presented to [facility] on 01/19/22 with progressive weakness and confusion in setting of likely UTI. Pt also noted with a fib with RVR. PMH: HTN, hiatal hernia, migraine, a fib, TIA, PNA, recurrent UTIs   Clinical Impression  Pt presents with an overall decrease in functional mobility secondary to above. PTA, pt at ILF, ambulating mod independent with occasional use of walker. Today, pt able to perform bed mobility and transfers. Upon initial sitting EOB, pt became dizzy (BP 103/79). Sudden urge for bowel movement, step pivot transfer mod A +2 for safety. With prolonged sitting on BSC, pt continued to feel dizzy, unwell, cognition began to decline, requiring quick transfer back to bed dependent +2 with decreased consciousness noted. Nursing staff notified, pt slowly began to re-orient, fixating on fecal matter of components of the event. Pt pleasant and cooperative. Pt would benefit from continued acute PT services to maximize functional mobility and independence with vital signs closely monitored prior to d/c to SNF at [facility]F.     Recommendations for follow up therapy are one component of a multi-disciplinary discharge planning process, led by the attending physician.  Recommendations may be updated based on patient status, additional functional criteria and insurance authorization.  Follow Up Recommendations Skilled nursing-short term rehab (<3 hours/day) (at [facility]) Can patient physically be transported by private vehicle: No    Assistance Recommended at Discharge Frequent or constant Supervision/Assistance  Patient can return home with the following  A lot of help with walking and/or transfers;A lot of help with bathing/dressing/bathroom;Assistance with cooking/housework;Assist for transportation;Help with stairs or ramp for  entrance    Equipment Recommendations  (TBD)  Recommendations for Other Services       Functional Status Assessment Patient has had a recent decline in their functional status and demonstrates the ability to make significant improvements in function in a reasonable and predictable amount of time.     Precautions / Restrictions Precautions Precautions: Fall Precaution Comments: Watch vitals closely, decreased responsiveness episode with prolonged sitting on BSC Restrictions Weight Bearing Restrictions: No      Mobility  Bed Mobility Overal bed mobility: Needs Assistance Bed Mobility: Supine to Sit, Sit to Supine     Supine to sit: Min assist     General bed mobility comments: Pt able to sit up in bed, position LEs as appropriate with use of hand rail.    Transfers Overall transfer level: Needs assistance Equipment used: Rolling walker (2 wheels) Transfers: Bed to chair/wheelchair/BSC, Sit to/from Stand Sit to Stand: Min assist   Step pivot transfers: Mod assist, +2 safety/equipment Squat pivot transfers: +2 physical assistance, Total assist     General transfer comment: Cues and min assist for walker assistance transfering to [facility] quickly due to active fecal incontinence. Pt required dependent +2 transfer from [facility] to bed after decreasing consciousness.    Ambulation/Gait                  Stairs            Wheelchair Mobility    Modified Rankin (Stroke Patients Only)       Balance Overall balance assessment: Needs assistance Sitting-balance support: Single extremity supported, Feet supported Sitting balance-Leahy Scale: Poor Sitting balance - Comments: Consistent verbal cues to sit upright. Initial dizziness experienced after sitting up, prolonged sitting supervision while preparing for [facility] transfer.  Postural control: Posterior lean Standing balance support: Bilateral upper extremity supported, Reliant on assistive device for balance Standing  balance-Leahy Scale: Poor Standing balance comment: minA for steadying                             Pertinent Vitals/Pain Pain Assessment Pain Assessment: Faces Faces Pain Scale: No hurt    Home Living Family/patient expects to be discharged to:: Other (Comment) (Independent living)                        Prior Function Prior Level of Function : Needs assist       Physical Assist : ADLs (physical)   ADLs (physical): IADLs Mobility Comments: no use of AD typically for mobility, will use walker to dining [place] after a fib episode. dining [place] is around 10 minute walk, has 1 meal a day there ADLs Comments: Independent with ADLs, simple meal prep in apartment or walking to [place] for meals. Daughter provides transportation assist or picking up groceries as needed. Pt does not drive     Hand Dominance   Dominant Hand: Right    Extremity/Trunk Assessment   Upper Extremity Assessment Upper Extremity Assessment: Generalized weakness    Lower Extremity Assessment Lower Extremity Assessment: Generalized weakness (functional lower extremity strength)    Cervical / Trunk Assessment Cervical / Trunk Assessment: Kyphotic  Communication   Communication: No difficulties  Cognition Arousal/Alertness: Awake/alert   Overall Cognitive Status: Impaired/Different from baseline Area of Impairment: Attention, Awareness, Safety/judgement, Problem solving, Following commands                   Current Attention Level: Sustained   Following Commands: Follows one step commands inconsistently   Awareness: Intellectual Problem Solving: Difficulty sequencing, Requires verbal cues, Requires tactile cues, Slow processing General Comments: Pt able to complete orientation questions, decreased awareness as session progressed. Aware of needs for voiding, hyperfixation on components of bowel movement        General Comments General comments (skin integrity, edema,  etc.): Episode of fecal incontienence, watch pt response after progressing mobility.    Exercises     Assessment/Plan    PT Assessment Patient needs continued PT services  PT Problem List Decreased strength;Decreased knowledge of use of DME;Decreased activity tolerance;Decreased balance;Decreased mobility;Cardiopulmonary status limiting activity       PT Treatment Interventions DME instruction;Balance training;Gait training;Functional mobility training;Therapeutic activities;Therapeutic exercise;Patient/family education    PT Goals (Current goals can be found in the Care Plan section)  Acute Rehab PT Goals Patient Stated Goal: to feel better PT Goal Formulation: With patient Time For Goal Achievement: 02/04/22 Potential to Achieve Goals: Good    Frequency Min 3X/week     Co-evaluation               AM-PAC PT "6 Clicks" Mobility  Outcome Measure Help needed turning from your back to your side while in a flat bed without using bedrails?: A Little Help needed moving from lying on your back to sitting on the side of a flat bed without using bedrails?: A Little Help needed moving to and from a bed to a chair (including a wheelchair)?: A Lot Help needed standing up from a chair using your arms (e.g., wheelchair or bedside chair)?: A Little Help needed to walk in hospital room?: Total Help needed climbing 3-5 steps with a railing? : Total 6 Click Score: 13  End of Session Equipment Utilized During Treatment: Gait belt Activity Tolerance: Treatment limited secondary to medical complications (Comment) Patient left: in bed;with call bell/phone within reach Nurse Communication: Mobility status PT Visit Diagnosis: Other abnormalities of gait and mobility (R26.89);Muscle weakness (generalized) (M62.81)    Time: 5041-3643 PT Time Calculation (min) (ACUTE ONLY): 40 min   Charges:   PT Evaluation $PT Eval Moderate Complexity: 1 Mod PT Treatments $Therapeutic Activity: 8-22  mins       Chipper Oman, SPT   Asher Josecarlos Harriott 01/21/2022, 4:45 PM

## 2022-01-22 ENCOUNTER — Telehealth: Payer: Self-pay | Admitting: Cardiology

## 2022-01-22 DIAGNOSIS — R531 Weakness: Secondary | ICD-10-CM

## 2022-01-22 DIAGNOSIS — I4891 Unspecified atrial fibrillation: Secondary | ICD-10-CM | POA: Diagnosis not present

## 2022-01-22 DIAGNOSIS — I10 Essential (primary) hypertension: Secondary | ICD-10-CM | POA: Diagnosis not present

## 2022-01-22 LAB — CBC
HCT: 38.5 % (ref 36.0–46.0)
Hemoglobin: 12.7 g/dL (ref 12.0–15.0)
MCH: 30.2 pg (ref 26.0–34.0)
MCHC: 33 g/dL (ref 30.0–36.0)
MCV: 91.4 fL (ref 80.0–100.0)
Platelets: 229 10*3/uL (ref 150–400)
RBC: 4.21 MIL/uL (ref 3.87–5.11)
RDW: 13.7 % (ref 11.5–15.5)
WBC: 8.6 10*3/uL (ref 4.0–10.5)
nRBC: 0 % (ref 0.0–0.2)

## 2022-01-22 LAB — BASIC METABOLIC PANEL
Anion gap: 8 (ref 5–15)
BUN: 17 mg/dL (ref 8–23)
CO2: 23 mmol/L (ref 22–32)
Calcium: 8.6 mg/dL — ABNORMAL LOW (ref 8.9–10.3)
Chloride: 109 mmol/L (ref 98–111)
Creatinine, Ser: 0.84 mg/dL (ref 0.44–1.00)
GFR, Estimated: >60 mL/min (ref >60)
Glucose, Bld: 117 mg/dL — ABNORMAL HIGH (ref 70–99)
Potassium: 3.5 mmol/L (ref 3.5–5.1)
Sodium: 140 mmol/L (ref 135–145)

## 2022-01-22 LAB — MAGNESIUM: Magnesium: 2.1 mg/dL (ref 1.7–2.4)

## 2022-01-22 LAB — ACTH STIMULATION, 3 TIME POINTS
Cortisol, 30 Min: 26.2 ug/dL
Cortisol, 60 Min: 32.4 ug/dL
Cortisol, Base: 8.5 ug/dL

## 2022-01-22 NOTE — Plan of Care (Signed)

## 2022-01-22 NOTE — TOC Progression Note (Signed)
Transition of Care [facility]) - Progression Note    Patient Details  Name: DANIA MARSAN MRN: [id number] Date of Birth: August 12, 1930  Transition of Care [facility]) CM/SW [place], RN Phone Number:[phone]  01/22/2022, 2:52 PM  Clinical Narrative:     Transition of Care [facility]) Screening Note   Patient Details  Name: VERNEDA HOLLOPETER Date of Birth: Aug 17, 1930   Transition of Care [facility]) CM/SW Contact:    Angelita Ingles, RN Phone Number: 01/22/2022, 2:52 PM    Transition of Care Department [facility]) has reviewed patient and no TOC needs have been identified at this time. We will continue to monitor patient advancement through interdisciplinary progression rounds. If new patient transition needs arise, please place a TOC consult.          Expected Discharge Plan and Services                                                 Social Determinants of Health (SDOH) Interventions    Readmission Risk Interventions     No data to display

## 2022-01-22 NOTE — Progress Notes (Signed)
Progress Note Patient: [name] XTK:[id number] DOB: 13-Mar-1931 DOA: 01/19/2022  DOS: the patient was seen and examined on 01/22/2022  Brief hospital course: PMH of PAF on Eliquis, TIA, HTN, HLD, recurrent UTI.  Presents with generalized weakness.  Found to have A-fib with RVR.  Initially was on Cardizem drip.  Had minimally elevated troponin.  On IV cefepime for possible UTI.  PT OT consulted. Due to persistent orthostatics and A-fib/flutter cardiology was consulted.  Assessment and Plan: Paroxysmal atrial fibrillation with rapid ventricular response ([place]) Presents with generalized weakness. Found to have A-fib with RVR in the ER. Does not feel any symptoms when she is in RVR. Appears to have some flutter waves on the telemetry. Started on Cardizem drip in the ER.  Was given 1 dose of long-acting Cardizem. At home on Lopressor 25 mg twice daily. Echocardiogram showed preserved EF 50%.  Global hypokinesis. Mild mitral regurg seen. Due to persistent orthostatics rate controlling medications were on hold. Cardiology consulted.  Currently considering resuming low-dose Lopressor and if unable to tolerate considering cardioversion on Monday. Continue Eliquis.   MDR E. coli UTI. Patient has prior history of repeat recurrent UTI and is on chronic Macrobid. Appears to have possible UTI with positive nitrates. Currently on IV cefepime.  Day 4.  Recommended family to consider 5 days of antibiotic and then transition to 1 dose of oral fosfomycin as she appears to have uncomplicated UTI. No urine cultures done on admission.   Syncopal event 10/5.  Severe orthostatic hypotension. Brief syncopal event on 10/5 without any significant events on telemetry. Orthostatic vitals significantly positive.  Dropped to 70 systolic from 992E systolic. Treated with IV fluids. Cosyntropin stim test negative. Most likely will require midodrine.  Add compression stockings and abdominal binder.   Hyperlipidemia  with target LDL less than 130 Continue Pravachol.   History of TIA (transient ischemic attack) Continue Eliquis. Currently no evidence of stroke or TIA.   Essential hypertension, benign Patient is on Norvasc.  Currently holding. Might consider transitioning to Cardizem.   Generalized weakness Due to UTI. Monitor.   Anxiety Continue current regimen.   Mild renal dysfunction. Hypotension. Baseline serum creatinine around 0.8.  Currently serum creatinine 1.03.  In the setting of low blood pressure patient was treated with IV fluids.  Now on hold.  Subjective: No nausea no vomiting.  No chest pain.  Diarrhea improving.  Oral intake improving.  Still had a drop in blood pressure while performing orthostatic although less symptomatic.  Telemetry shows flutter waves.  Physical Exam: Vitals:   01/22/22 0600 01/22/22 0900 01/22/22 1123 01/22/22 1557  BP: (!) 146/74 108/74 134/77 135/61  Pulse:  86 90 93  Resp: '20 19 20 17  '$ Temp:  97.8 F (36.6 C) 97.8 F (36.6 C) 98.1 F (36.7 C)  TempSrc:  Oral Oral Oral  SpO2:   99% 95%  Weight:      Height:       General: Appear in mild distress; no visible Abnormal Neck Mass Or lumps, Conjunctiva normal Cardiovascular: S1 and S2 Present, aortic systolic  Murmur, Respiratory: good respiratory effort, Bilateral Air entry present and CTA, no Crackles, no wheezes Abdomen: Bowel Sound present, Non tender  Extremities: no Pedal edema Neurology: alert and oriented to time, place, and person  Gait not checked due to patient safety concerns   Data Reviewed: I have Reviewed nursing notes, Vitals, and Lab results since pt's last encounter. Pertinent lab results CBC and BMP and cosyntropin stimulation test  I have ordered test including CBC and BMP I have discussed pt's care plan and test results with cardiology.   Family Communication: Family at bedside  Disposition: Status is: Inpatient Remains inpatient appropriate because: Still remains with  severely orthostatic hypotension unable to ambulate safely  Author: Berle Mull, MD 01/22/2022 7:33 PM  Please look on [email or web address] to find out who is on call.

## 2022-01-22 NOTE — Telephone Encounter (Signed)
Pt daughter states she would like to make Dr. [name] aware that pt is in the hospital. She states "my brother (pt's son) is a doctor and he is worried the doctor's in the hospital aren't doing everything they can because she's old"

## 2022-01-22 NOTE — Progress Notes (Signed)
Pharmacy Antibiotic Note  [name] is a 88 y.o. female admitted on 01/19/2022 with UTI.  Pharmacy has been consulted for cefepime dosing.  Pt has h/o ESBL E.coli UTI in 2021. -SCr= 0.8 -Plans noted to change to oral antibiotic today  Plan: -Cefepime 2g IV Q24H. -Will follow plans for length of therapy   Height: '5\' 5"'$  (165.1 cm) Weight: 67.9 kg (149 lb 11.1 oz) IBW/kg (Calculated) : 57  Temp (24hrs), Avg:98.1 F (36.7 C), Min:97.8 F (36.6 C), Max:98.3 F (36.8 C)  Recent Labs  Lab 01/19/22 1946 01/21/22 0018 01/21/22 1644 01/22/22 0545  WBC 8.9 13.1*  --  8.6  CREATININE 0.85 1.03* 1.03* 0.84     Estimated Creatinine Clearance: 40.9 mL/min (by C-G formula based on SCr of 0.84 mg/dL).    Allergies  Allergen Reactions   Lactose Other (See Comments)    Dairy Sensitivity  Dairy Sensitivity     Lisinopril Other (See Comments)    DIZZY   Bactrim [Sulfamethoxazole-Trimethoprim] Rash    Thank you for allowing pharmacy to be a part of this patient's care.  Hildred Laser, PharmD Clinical Pharmacist **Pharmacist phone directory can now be found on [email or web address] ([email or web address]).  Listed under [place].

## 2022-01-22 NOTE — Plan of Care (Signed)

## 2022-01-22 NOTE — Progress Notes (Signed)
Mobility Specialist Progress Note    01/22/22 1134  Mobility  Activity Stood at bedside  Activity Response Tolerated well  $Mobility charge 1 Mobility  Level of Assistance Minimal assist, patient does 75% or more  Assistive Device Front wheel walker  Mobility Referral Yes   Pt received supine and agreeable. C/o some dizziness and lightheadedness during standing trial. Returned to sitting EOB w/ MD and daughter present.   Orthostatic BPs Supine 134/77 (96), 89 HR  Sitting 109/63 (72), 92 HR  Standing 119/65 (82), 100 HR  Standing after 3 min 68/59 (63), 93 HR        Insurance account manager

## 2022-01-22 NOTE — Care Management Important Message (Signed)
Important Message  Patient Details  Name: [name] MRN: [id number] Date of Birth: Apr 15, 1931   Medicare Important Message Given:  Yes     Cloy Cozzens 01/22/2022, 2:45 PM

## 2022-01-22 NOTE — Telephone Encounter (Signed)
Dr.Jordan spoke to patient's daughter Thayer Headings.

## 2022-01-22 NOTE — Consult Note (Addendum)
Cardiology Consultation   Patient ID: AUDRYNA WENDT MRN: [id number]; DOB: [birth year]  Admit date: 01/19/2022 Date of Consult: 01/22/2022  PCP:  [name], MD   Keys Providers Cardiologist:  Peter [name], MD  Cardiology APP:  [name], [place]    Patient Profile:   [name] is a 88 y.o. female with a history of paroxysmal atrial fibrillation on Eliquis, hypertension, hyperlipidemia, TIA, hiatal hernia s/p Nissen fundoplication, and migraines who is being seen 01/22/2022 for the evaluation of atrial fibrillation at the request of Dr. Posey Pronto.  History of Present Illness:   [name] is a 88 year old female with the above history who is followed by Dr. [name].  She is primarily followed by Dr. [name] for atrial fibrillation. She also follows in the Atrial Fibrillation clinic for this and has been maintained on Lopressor and Eliquis for this. Patient was recently seen by Dr. [name] on 01/01/2022 at which time she was doing well. She reported that she sometimes feels like she is in atrial fibrillation in the morning but was minimally symptomatic with no significant shortness of breath or chest pain. She was maintaining sinus rhythm on exam.  Patient presented to the ED on 01/19/2022 via EMS for further evaluation of weakness. Upon EMS arrival, she was found to be in atrial fibrillation with RVR. She was also found to have a UTI and was felt to be dehydration which likely precipitated her atrial fibrillation. Initial EKG showed atrial fibrillation, rate 134 bpm, with diffuse ST depression. High-sensitivity troponin minimally elevated and flat at 25 >> 35 >> 32 consistent with demand ischemia. D-dimer negative. Chest x-ray showed chronic emphysematous changes of the upper lungs.  WBC 8.9, Hgb 15.9, Plts 263. Na 138, K 4.0, Glucose 120, Bun 14, Cr 0.85. LFTs normal. She was started on antibiotics and gentle IV fluids. She was also started on IV Cardizem and continued on Eliquis.  Echo showed LVEF of 50% with global hypokinesis, normal RV, moderate MAC with mild MR, and mild dilatation of the aortic root measuring 39 mm.  Patient had a syncopal episode on 01/21/2022 while on the bedside commode. Orthostatics were positive with BP dropping from 106/57 when supine to 75/60 when sitting. IV Cardizem was stopped and she was given more fluids. Repeat orthostatics today had improved but were still positive with BP dropping from 134/77 when supine to 109/62 when sitting but then improved back to 119/65 when standing. Cardiology was consulted today for assistance with his atrial fibrillation.  At the time of this evaluation, patient is resting comfortably in no acute distress. She states she was in her usual state of health until 01/19/2022. She states she woke up that morning and was feeling fine. She went to get her hair done and walked around her facility without any issues. She then sat down for a while and when she tried to get up to go to the bathroom she couldn't because she was profoundly weak.  She denies any other strokelike symptoms such as numbness/tingling, headache, slurred speech.  She called her neighbor who called 911.  She states she is usually aware when she is in atrial fibrillation but she was not this time.  She denied any palpitations, lightheadedness, dizziness.  No chest pain, shortness of breath, orthopnea, or PND.  He has been told she has minimal lower extremity swelling before but nothing significant on exam.  She denies any recent fevers or illnesses.  No GI symptoms prior to admission  but she does states she had some diarrhea since being here.  No abnormal bleeding in urine or stools.  Does have a history of UTIs and is on Macrobid for prophylaxis for this which she states has helped significantly.  She denies any dysuria lately but does states she has been urinating more than normal.  She also states she got the flu vaccine the day before this event.  She states when  she had her last COVID-vaccine, she also had significant weakness following this but it was not this severe.  We discussed her syncopal episode yesterday.  RN notes that she was on the bedside commode during this episode and MD note mentions that this occurred in the setting of having a large bowel movement.  However, patient states that she was not having a bowel movement at the time of this event.  She states that RN had just removed the pure wick and she was sitting on the edge of the bed about to work with PT when she started to feel dizzy and like something was not right and passed out for few seconds.  She denies any palpitations prior to this event.  Past Medical History:  Diagnosis Date   Allergic rhinitis    Arthritis    "hands, little in my feet" (06/22/2016)   Candidiasis of vulva and vagina    Cystocele, midline    Disorder of bone and cartilage, unspecified    Fibroids    Hypertension    Large hiatal hernia 06/23/2016   Migraine    "none in the last few years" (06/22/2016)   Osteopenia    Other chronic cystitis    Other sign and symptom in breast    Paroxysmal atrial fibrillation ([place]) 11/30/2012   Pneumonia    "I've had walking pneumonia a couple times"  (06/22/2016)   Pneumonia dx'd 06/15/2016   Rectal incontinence    Stroke ([place]) 2014   hx of mini stroke    TIA (transient ischemic attack) 2015    Past Surgical History:  Procedure Laterality Date   CARDIOVERSION N/A 06/29/2016   Procedure: CARDIOVERSION;  Surgeon: Jerline Pain, MD;  Location: [facility];  Service: Cardiovascular;  Laterality: N/A;   CATARACT EXTRACTION W/ INTRAOCULAR LENS IMPLANT Left    DILATION AND CURETTAGE OF UTERUS     hiatel hernia  08/20/2016   HYSTEROSCOPY WITH D & C  03/16/1999   INSERTION OF MESH N/A 08/20/2016   Procedure: INSERTION OF MESH;  Surgeon: Ralene Ok, MD;  Location: [facility];  Service: General;  Laterality: N/A;   TEE WITHOUT CARDIOVERSION N/A 11/06/2012   Procedure:  TRANSESOPHAGEAL ECHOCARDIOGRAM (TEE);  Surgeon: Lelon Perla, MD;  Location: [facility];  Service: Cardiovascular;  Laterality: N/A;   TONSILLECTOMY  1937   VAGINAL HYSTERECTOMY  09/2005   Anterior repair; Removal of urethral caruncle./notes 09/02/2015     Home Medications:  Prior to Admission medications   Medication Sig Start Date End Date Taking? Authorizing Provider  acetaminophen (TYLENOL) 500 MG tablet Take 500-1,000 mg by mouth every 6 (six) hours as needed (for headache/pain.).   Yes [provider]  ALPRAZolam (XANAX) 0.25 MG tablet TAKE 1 TABLET(0.25 MG) BY MOUTH TWICE DAILY AS NEEDED FOR ANXIETY Patient taking differently: Take 0.25 mg by mouth 2 (two) times daily as needed for anxiety. 08/28/20  Yes [name], MD  amLODipine (NORVASC) 5 MG tablet Take 1 tablet (5 mg total) by mouth daily. 12/14/21 12/14/22 Yes [name], [name], MD  CALCIUM CARBONATE-VIT D-MIN PO Take 2 tablets by mouth 3 (three) times a week.    Yes [provider]  CRANBERRY PO Take 1 tablet by mouth daily.   Yes [provider]  ELIQUIS 5 MG TABS tablet TAKE 1 TABLET BY MOUTH TWICE DAILY Patient taking differently: Take 5 mg by mouth 2 (two) times daily. 12/07/21  Yes [name], [name], MD  estradiol (ESTRACE) 0.1 MG/GM vaginal cream Place 1 Applicatorful vaginally 3 (three) times a week. 08/22/20  Yes [provider]  Eyelid Cleansers (OCUSOFT EYELID CLEANSING EX) Apply 1 application  topically as needed (dry eyes).   Yes [provider]  Glucosamine-Chondroitin (COSAMIN DS PO) Take 1 tablet by mouth 2 (two) times daily.   Yes [provider]  loperamide (IMODIUM A-D) 2 MG tablet Take 2 mg by mouth as needed for diarrhea or loose stools.   Yes [provider]  loratadine (CLARITIN) 10 MG tablet Take 10 mg by mouth daily as needed for allergies.    Yes [provider]  metoprolol tartrate (LOPRESSOR) 25 MG tablet TAKE 1 TABLET BY MOUTH TWICE  DAILY Patient taking differently: Take 25 mg by mouth 2 (two) times daily. 06/12/21  Yes [name], [name], MD  nitrofurantoin, macrocrystal-monohydrate, (MACROBID) 100 MG capsule Take 100 mg by mouth at bedtime. 05/29/20  Yes [provider]  ondansetron (ZOFRAN) 4 MG tablet Take 1 tablet (4 mg total) by mouth every 8 (eight) hours as needed for nausea or vomiting. 07/07/16  Yes [name], MD  Polyethyl Glycol-Propyl Glycol (SYSTANE OP) Place 1 drop into both eyes 2 (two) times daily as needed (for dry eyes).    Yes [provider]  pravastatin (PRAVACHOL) 40 MG tablet TAKE 1 TABLET(40 MG) BY MOUTH DAILY Patient taking differently: Take 40 mg by mouth daily. 12/10/21  Yes [name], MD  Probiotic Product (PROBIOTIC DAILY) CAPS Take 2 capsules by mouth daily.   Yes [provider]  influenza vaccine adjuvanted (FLUAD QUADRIVALENT) 0.5 ML injection Inject into the muscle. 01/18/22   Carlyle Basques, MD    Inpatient Medications: Scheduled Meds:  apixaban  5 mg Oral BID   estradiol  1 Applicatorful Vaginal Once per day on Mon Wed Fri   pravastatin  40 mg Oral Daily   saccharomyces boulardii  250 mg Oral BID   Continuous Infusions:  ceFEPime (MAXIPIME) IV Stopped (01/21/22 1800)   PRN Meds: acetaminophen **OR** acetaminophen, ALPRAZolam  Allergies:    Allergies  Allergen Reactions   Lactose Other (See Comments)    Dairy Sensitivity  Dairy Sensitivity     Lisinopril Other (See Comments)    DIZZY   Bactrim [Sulfamethoxazole-Trimethoprim] Rash    Social History:   Social History   Socioeconomic History   Marital status: Widowed    Spouse name: Not on file   Number of children: 2   Years of education: Not on file   Highest education level: Not on file  Occupational History   Not on file  Tobacco Use   Smoking status: Never   Smokeless tobacco: Never   Tobacco comments:    Never smoke 10/01/21  Vaping Use   Vaping Use: Never used  Substance  and Sexual Activity   Alcohol use: Yes    Alcohol/week: 1.0 standard drink of alcohol    Types: 1 Glasses of wine per week    Comment: one glass of wine once a month 10/01/21   Drug use: No  Sexual activity: Not Currently  Other Topics Concern   Not on file  Social History Narrative   Patient is an independent resident at [facility] and lives by herself-no pets.   Past profession was a Agricultural engineer, Network engineer, and Writer.   Exercise by walking everyday.    Patient has a living will. POA and DNR.    Social Determinants of Health   Financial Resource Strain: Low Risk  (12/14/2020)   Overall Financial Resource Strain (CARDIA)    Difficulty of Paying Living Expenses: Not hard at all  Food Insecurity: No Food Insecurity (12/14/2020)   Hunger Vital Sign    Worried About Running Out of Food in the Last Year: Never true    [place] in the Last Year: Never true  Transportation Needs: No Transportation Needs (12/14/2020)   PRAPARE - Hydrologist (Medical): No    Lack of Transportation (Non-Medical): No  Physical Activity: Insufficiently Active (12/14/2020)   Exercise Vital Sign    Days of Exercise per Week: 7 days    Minutes of Exercise per Session: 10 min  Stress: Stress Concern Present (12/14/2020)   [place]    Feeling of Stress : To some extent  Social Connections: Moderately Isolated (12/14/2020)   Social Connection and Isolation Panel [NHANES]    Frequency of Communication with Friends and Family: Three times a week    Frequency of Social Gatherings with Friends and Family: Three times a week    Attends Religious Services: More than 4 times per year    Active Member of Clubs or Organizations: No    Attends Archivist Meetings: Never    Marital Status: Widowed  Intimate Partner Violence: Not At Risk (12/14/2020)   Humiliation, Afraid, Rape, and Kick questionnaire    Fear of  Current or Ex-Partner: No    Emotionally Abused: No    Physically Abused: No    Sexually Abused: No    Family History:   Family History  Problem Relation Age of Onset   Cancer Mother    Hypertension Mother    Heart disease Father    Heart disease Brother    Emphysema Brother      ROS:  Please see the history of present illness.  Review of Systems  Constitutional:  Negative for chills and fever.  HENT:  Positive for congestion (allergies).   Respiratory:  Negative for shortness of breath.   Cardiovascular:  Positive for leg swelling. Negative for chest pain, palpitations, orthopnea and PND.  Gastrointestinal:  Positive for diarrhea. Negative for blood in stool, melena, nausea and vomiting.  Genitourinary:  Negative for hematuria.  Musculoskeletal:  Negative for myalgias.  Neurological:  Positive for dizziness, loss of consciousness and weakness.  Endo/Heme/Allergies:  Does not bruise/bleed easily.  Psychiatric/Behavioral:  Negative for substance abuse.     All other ROS reviewed and negative.     Physical Exam/Data:   Vitals:   01/22/22 0500 01/22/22 0600 01/22/22 0900 01/22/22 1123  BP: (!) 129/59 (!) 146/74 108/74 134/77  Pulse:   86 90  Resp: _0 Temp:   97.8 F (36.6 C) 97.8 F (36.6 C)  TempSrc:   Oral Oral  SpO2:    99%  Weight:      Height:        Intake/Output Summary (Last 24 hours) at 01/22/2022 1540 Last data filed at 01/22/2022 1234 Gross per 24 hour  Intake 340 ml  Output 450 ml  Net -110 ml      01/19/2022   11:00 PM 01/01/2022    3:11 PM 10/28/2021    8:18 AM  Last 3 Weights  Weight (lbs) 149 lb 11.1 oz 149 lb 9.6 oz 149 lb 6.4 oz  Weight (kg) 67.9 kg 67.858 kg 67.767 kg     Body mass index is 24.91 kg/m.  General: 88 y.o. Caucasian female resting comfortably in no acute distress. HEENT: Normocephalic and atraumatic. Sclera clear. Neck: Supple. No carotid bruits. No JVD. Heart: Irregularly irregular rhythm with normal rate.  Distinct S1 and S2. No murmurs, gallops, or rubs.  Lungs: No increased work of breathing. Faint crackles noted in left base but otherwise lungs clear to auscultation. Abdomen: Soft, non-distended, and non-tender to palpation. Bowel sounds present. Extremities: Minimal lower extremity edema of bilateral lower extremities.  Skin: Warm and dry. Neuro: Alert and oriented x3. No focal deficits. Psych: Normal affect. Responds appropriately.  EKG:  The EKG was personally reviewed and demonstrates:  Initial EKG on 02/15/2022 showed atrial fibrillation, rate 134 bpm, with PVC, and diffuse ST depressions (likely rate related).  Repeat EKG on 01/20/2022 showed atrial fibrillation, rate 75 beats minute, with very slight ST depressions in lateral leads.  Telemetry:  Telemetry was personally reviewed and demonstrates:  Atrial fibrillation/flutter with rates mostly in the 70s to 80s right now but occasionally in the low 100s. As high as 120s to 130s this admission.   Relevant CV Studies:  Echocardiogram 01/20/2022: Impressions:  1. Left ventricular ejection fraction, by estimation, is 50%. The left  ventricle has mildly decreased function. The left ventricle demonstrates  global hypokinesis. Left ventricular diastolic parameters are  indeterminate.   2. Right ventricular systolic function is normal. The right ventricular  size is normal. There is normal pulmonary artery systolic pressure. The  estimated right ventricular systolic pressure is 14.7 mmHg.   3. Left atrial size was mildly dilated.   4. The mitral valve is normal in structure. Mild mitral valve  regurgitation. No evidence of mitral stenosis. Moderate mitral annular  calcification.   5. The aortic valve is tricuspid. There is mild calcification of the  aortic valve. Aortic valve regurgitation is trivial. No aortic stenosis is  present.   6. Aortic dilatation noted. There is mild dilatation of the aortic root,  measuring 39 mm.   7. The  inferior vena cava is normal in size with greater than 50%  respiratory variability, suggesting right atrial pressure of 3 mmHg.   8. The patient is in atrial fibrillation.    Laboratory Data:  High Sensitivity Troponin:   Recent Labs  Lab 01/19/22 0010 01/20/22 0303 01/21/22 0556  TROPONINIHS 25* 35* 32*     Chemistry Recent Labs  Lab 01/21/22 0018 01/21/22 1644 01/22/22 0545  NA 134* 139 140  K 3.5 3.6 3.5  CL 101 107 109  CO2 _0 GLUCOSE 136* 126* 117*  BUN 22 25* 17  CREATININE 1.03* 1.03* 0.84  CALCIUM 8.6* 9.0 8.6*  MG 2.1 2.1 2.1  GFRNONAA 52* 52* >60  ANIONGAP _1 Recent Labs  Lab 01/19/22 1946  PROT 8.5*  ALBUMIN 4.6  AST 30  ALT 18  ALKPHOS 61  BILITOT 0.6   Lipids No results for input(s): "CHOL", "TRIG", "HDL", "LABVLDL", "LDLCALC", "CHOLHDL" in the last 168 hours.  Hematology Recent Labs  Lab 01/19/22 1946 01/21/22 0018 01/22/22 0545  WBC 8.9  13.1* 8.6  RBC 5.16* 4.54 4.21  HGB 15.9* 13.7 12.7  HCT 47.7* 41.1 38.5  MCV 92.4 90.5 91.4  MCH 30.8 30.2 30.2  MCHC 33.3 33.3 33.0  RDW 13.5 13.8 13.7  PLT 263 264 229   Thyroid  Recent Labs  Lab 01/19/22 1846 01/19/22 1946  TSH 2.428  --   FREET4  --  0.84    BNPNo results for input(s): "BNP", "PROBNP" in the last 168 hours.  DDimer  Recent Labs  Lab 01/19/22 0010  DDIMER 0.31     Radiology/Studies:  DG Abd 2 Views  Result Date: 01/20/2022 CLINICAL DATA:  Abdominal discomfort. EXAM: ABDOMEN - 2 VIEW COMPARISON:  Abdominal radiograph and CT 06/22/2016 FINDINGS: Gas and stool are present in the colon to the level of the rectum. There is mild gaseous distension of the cecum/ascending colon. No dilated loops of bowel are seen to suggest obstruction. Aortic atherosclerosis and moderate lumbar levoscoliosis are noted. IMPRESSION: No evidence of bowel obstruction. Electronically Signed   By: Logan Bores M.D.   On: 01/20/2022 15:04   ECHOCARDIOGRAM COMPLETE  Result Date:  01/20/2022    ECHOCARDIOGRAM REPORT   Patient Name:   BIRDELLA SIPPEL Date of Exam: 01/20/2022 Medical Rec #:  [id number]     Height:       65.0 in Accession #:    [id number]    Weight:       149.7 lb Date of Birth:  1930/11/21      BSA:          1.749 m Patient Age:    74 years      BP:           147/62 mmHg Patient Gender: F             HR:           76 bpm. Exam Location:  Inpatient Procedure: 2D Echo Indications:     atrial fibrillation  History:         Patient has prior history of Echocardiogram examinations, most                  recent 06/28/2016. TIA, Arrythmias:Atrial Fibrillation; Risk                  Factors:Hypertension and Dyslipidemia.  Sonographer:     Harvie Junior Referring Phys:  [id number] Hardy Diagnosing Phys: Franki Monte  Sonographer Comments: Technically difficult study due to poor echo windows. Image acquisition challenging due to respiratory motion and talking. IMPRESSIONS  1. Left ventricular ejection fraction, by estimation, is 50%. The left ventricle has mildly decreased function. The left ventricle demonstrates global hypokinesis. Left ventricular diastolic parameters are indeterminate.  2. Right ventricular systolic function is normal. The right ventricular size is normal. There is normal pulmonary artery systolic pressure. The estimated right ventricular systolic pressure is 52.8 mmHg.  3. Left atrial size was mildly dilated.  4. The mitral valve is normal in structure. Mild mitral valve regurgitation. No evidence of mitral stenosis. Moderate mitral annular calcification.  5. The aortic valve is tricuspid. There is mild calcification of the aortic valve. Aortic valve regurgitation is trivial. No aortic stenosis is present.  6. Aortic dilatation noted. There is mild dilatation of the aortic root, measuring 39 mm.  7. The inferior vena cava is normal in size with greater than 50% respiratory variability, suggesting right atrial pressure of 3 mmHg.  8. The patient is in atrial  fibrillation. FINDINGS  Left Ventricle: Left ventricular ejection fraction, by estimation, is 50%. The left ventricle has mildly decreased function. The left ventricle demonstrates global hypokinesis. The left ventricular internal cavity size was normal in size. There is no left ventricular hypertrophy. Left ventricular diastolic parameters are indeterminate. Right Ventricle: The right ventricular size is normal. No increase in right ventricular wall thickness. Right ventricular systolic function is normal. There is normal pulmonary artery systolic pressure. The tricuspid regurgitant velocity is 2.05 m/s, and  with an assumed right atrial pressure of 3 mmHg, the estimated right ventricular systolic pressure is 54.6 mmHg. Left Atrium: Left atrial size was mildly dilated. Right Atrium: Right atrial size was normal in size. Pericardium: Trivial pericardial effusion is present. Mitral Valve: The mitral valve is normal in structure. There is mild calcification of the mitral valve leaflet(s). Moderate mitral annular calcification. Mild mitral valve regurgitation. No evidence of mitral valve stenosis. Tricuspid Valve: The tricuspid valve is normal in structure. Tricuspid valve regurgitation is trivial. Aortic Valve: The aortic valve is tricuspid. There is mild calcification of the aortic valve. Aortic valve regurgitation is trivial. Aortic regurgitation PHT measures 577 msec. No aortic stenosis is present. Aortic valve mean gradient measures 2.3 mmHg. Aortic valve peak gradient measures 3.9 mmHg. Aortic valve area, by VTI measures 3.25 cm. Pulmonic Valve: The pulmonic valve was normal in structure. Pulmonic valve regurgitation is trivial. Aorta: Aortic dilatation noted. There is mild dilatation of the aortic root, measuring 39 mm. Venous: The inferior vena cava is normal in size with greater than 50% respiratory variability, suggesting right atrial pressure of 3 mmHg. IAS/Shunts: No atrial level shunt detected by color  flow Doppler.  LEFT VENTRICLE PLAX 2D LVIDd:         4.40 cm     Diastology LVIDs:         3.00 cm     LV e' medial:    7.18 cm/s LV PW:         0.90 cm     LV E/e' medial:  17.3 LV IVS:        0.90 cm     LV e' lateral:   7.94 cm/s LVOT diam:     2.10 cm     LV E/e' lateral: 15.7 LV SV:         56 LV SV Index:   32 LVOT Area:     3.46 cm  LV Volumes (MOD) LV vol d, MOD A2C: 55.8 ml LV vol d, MOD A4C: 50.4 ml LV vol s, MOD A2C: 22.2 ml LV vol s, MOD A4C: 27.8 ml LV SV MOD A2C:     33.6 ml LV SV MOD A4C:     50.4 ml LV SV MOD BP:      28.2 ml RIGHT VENTRICLE RV Basal diam:  2.40 cm RV Mid diam:    2.00 cm RV S prime:     16.10 cm/s TAPSE (M-mode): 1.9 cm LEFT ATRIUM           Index        RIGHT ATRIUM           Index LA diam:      3.60 cm 2.06 cm/m   RA Area:     13.30 cm LA Vol (A2C): 54.0 ml 30.88 ml/m  RA Volume:   23.30 ml  13.32 ml/m LA Vol (A4C): 68.6 ml 39.22 ml/m  AORTIC VALVE  PULMONIC VALVE AV Area (Vmax):    2.78 cm     PV Vmax:       0.86 m/s AV Area (Vmean):   2.82 cm     PV Peak grad:  2.9 mmHg AV Area (VTI):     3.25 cm AV Vmax:           98.43 cm/s AV Vmean:          67.933 cm/s AV VTI:            0.171 m AV Peak Grad:      3.9 mmHg AV Mean Grad:      2.3 mmHg LVOT Vmax:         79.05 cm/s LVOT Vmean:        55.275 cm/s LVOT VTI:          0.160 m LVOT/AV VTI ratio: 0.94 AI PHT:            577 msec  AORTA Ao Root diam: 3.90 cm Ao Asc diam:  3.60 cm MITRAL VALVE                TRICUSPID VALVE MV Area (PHT): 3.93 cm     TR Peak grad:   16.8 mmHg MV Decel Time: 193 msec     TR Vmax:        205.00 cm/s MR Peak grad: 58.2 mmHg MR Vmax:      381.50 cm/s   SHUNTS MV E velocity: 124.27 cm/s  Systemic VTI:  0.16 m MV A velocity: 37.73 cm/s   Systemic Diam: 2.10 cm MV E/A ratio:  3.29 Dalton McleanMD Electronically signed by Franki Monte Signature Date/Time: 01/20/2022/1:39:40 PM    Final (Updated)    DG Chest Portable 1 View  Result Date: 01/19/2022 CLINICAL DATA:  Atrial  fibrillation.  Operative ventricular rate. EXAM: PORTABLE CHEST 1 VIEW COMPARISON:  AP chest 12/27/2019, chest two views 08/16/2016 FINDINGS: Cardiac silhouette is again mildly enlarged. Mediastinal contours are within normal limits. Moderate calcification within the aortic arch. Increased lucencies within the upper lungs again suggesting chronic emphysematous changes. No pleural effusion or pneumothorax. Moderate multilevel degenerative disc changes of the thoracic spine. IMPRESSION: 1. No active disease. 2. Chronic emphysematous changes of the upper lungs. Electronically Signed   By: Yvonne Kendall M.D.   On: 01/19/2022 19:38     Assessment and Plan:   Paroxysmal Atrial Fibrillation Patient has a history of paroxysmal atrial fibrillation. She presented with profound weakness and was found to be in atrial fibrillation with RVR. Rates were initially in the 130s. Electrolytes within normal limits and TSH normal. Echo this admission showed normal LV function. - Rates currently well controlled in the 70s to 90s. - She was initially started on IV Cardizem and continued on home PO Lopressor 48m twice daily. However, this was stopped yesterday when she was found to be orthostatic.  - Continue Eliquis 520mtwice daily. - Discussed with MD - will try to restart very low dose Lopressor 12.89m68mwice daily and see how she tolerates this. Hopefully she will come out of this rhythm on her own as she is hydrated and UTI is treat. If not, can consider cardioversion. Will see if we can tentatively put her on the board for Monday. She states she has not missed any doses of her Eliquis.  Demand Ischemia High-sensitivity troponin 25 >> 35 >> 32. Initial EKG showed diffuse ST depression that I suspect were rate related. Echo shows LVEF of 50%  with global hypokinesis, normal RV, moderate MAC with mild MR, and mild dilatation of the aortic root measuring 39 mm. - Patient denies any chest pain. - Troponin elevation not  consistent with ACS. Consistent with demand ischemia in setting of rapid atrial fibrillation and orthostatic hypotension. No additional inpatient ischemic evaluation necessary.  Syncope Orthostatic Hypotension Patient had a very brief syncopal episodes when sitting on the side of the bed and was found to be orthostatic with BP dropping from 106/57 when supine to 75/60 when sitting. IV Cardizem and Lopressor were stopped and she was given more fluids. Repeat orthostatics today had improved but were still positive with BP dropping from 134/77 when supine to 109/62 when sitting but then improved back to 119/65 when standing.  - She was still having some mild lightheadedness this morning but feels fine now. - Will defer additional IV fluids to primary team.  - Will try to restart very low dose Lopressor 12.46m twice daily this evening. - Will order compression stockings.  Otherwise, per primary team.   Risk Assessment/Risk Scores:    CHA2DS2-VASc Score = 6  This indicates a 9.7% annual risk of stroke. The patient's score is based upon: CHF History: 0 HTN History: 1 Diabetes History: 0 Stroke History: 2 Vascular Disease History: 0 Age Score: 2 Gender Score: 1   For questions or updates, please contact CFriendlyPlease consult [email or web address] for contact info under    Signed, CDarreld Mclean PA-C  01/22/2022 3:40 PM  I have examined the patient and reviewed assessment and plan and discussed with patient.  Agree with above as stated.    Patient with poor p.o. intake.  She was orthostatic for some time.  She developed atrial fibrillation and generalized weakness.  Heart rates now much better controlled.  Blood pressure stable.  Did have orthostatic readings previously but these have improved.  We will add back metoprolol 12.5 mg p.o. twice daily.  Encourage PO intake. Hopefully, she will convert from A-fib to normal sinus rhythm.  She has been anticoagulated and has not  missed any doses of her medicines.  If atrial fibrillation persists and she has persistent weakness or is unable to ambulate at her normal level, could consider cardioversion.  Will tentatively schedule for Monday but hopefully, she will convert on her own and will not need it.  It appears that as an outpatient, she is typically in sinus rhythm.  JLarae Grooms

## 2022-01-22 NOTE — Telephone Encounter (Signed)
Called patients daughter- she states that patient is in the hospital (notes in epic) they would like for Dr.Jordan to review the notes. Her brother who is a doctor has been reviewing the notes and feel they are not fully treating her appropriately. Patient daughter states she would like for Dr.Jordan to review and make sure he agrees with what they are doing. She also mentions if Dr.Jordan has time they would like for him to talk with her brother to discuss.   She states she is appreciative of Dr.Jordan and his care.   Will send to Dr.Jordan to make him aware.  Thank you!

## 2022-01-23 DIAGNOSIS — R531 Weakness: Secondary | ICD-10-CM | POA: Diagnosis not present

## 2022-01-23 DIAGNOSIS — I4891 Unspecified atrial fibrillation: Secondary | ICD-10-CM | POA: Diagnosis not present

## 2022-01-23 DIAGNOSIS — E785 Hyperlipidemia, unspecified: Secondary | ICD-10-CM | POA: Diagnosis not present

## 2022-01-23 DIAGNOSIS — I10 Essential (primary) hypertension: Secondary | ICD-10-CM | POA: Diagnosis not present

## 2022-01-23 LAB — CBC
HCT: 37.9 % (ref 36.0–46.0)
Hemoglobin: 12.3 g/dL (ref 12.0–15.0)
MCH: 29.9 pg (ref 26.0–34.0)
MCHC: 32.5 g/dL (ref 30.0–36.0)
MCV: 92 fL (ref 80.0–100.0)
Platelets: 269 10*3/uL (ref 150–400)
RBC: 4.12 MIL/uL (ref 3.87–5.11)
RDW: 13.5 % (ref 11.5–15.5)
WBC: 8.7 10*3/uL (ref 4.0–10.5)
nRBC: 0 % (ref 0.0–0.2)

## 2022-01-23 LAB — BASIC METABOLIC PANEL
Anion gap: 8 (ref 5–15)
BUN: 20 mg/dL (ref 8–23)
CO2: 21 mmol/L — ABNORMAL LOW (ref 22–32)
Calcium: 8.6 mg/dL — ABNORMAL LOW (ref 8.9–10.3)
Chloride: 112 mmol/L — ABNORMAL HIGH (ref 98–111)
Creatinine, Ser: 0.73 mg/dL (ref 0.44–1.00)
GFR, Estimated: >60 mL/min (ref >60)
Glucose, Bld: 113 mg/dL — ABNORMAL HIGH (ref 70–99)
Potassium: 3.7 mmol/L (ref 3.5–5.1)
Sodium: 141 mmol/L (ref 135–145)

## 2022-01-23 LAB — MAGNESIUM: Magnesium: 2 mg/dL (ref 1.7–2.4)

## 2022-01-23 MED ORDER — METOPROLOL TARTRATE 12.5 MG HALF TABLET
12.5000 mg | ORAL_TABLET | Freq: Two times a day (BID) | ORAL | Status: DC
  Administered 2022-01-23 – 2022-01-26 (×7): 12.5 mg via ORAL
  Filled 2022-01-23 (×7): qty 1

## 2022-01-23 MED ORDER — SODIUM CHLORIDE 0.9 % IV SOLN
2.0000 g | INTRAVENOUS | Status: AC
  Administered 2022-01-23: 2 g via INTRAVENOUS
  Filled 2022-01-23: qty 12.5

## 2022-01-23 MED ORDER — POTASSIUM CHLORIDE CRYS ER 20 MEQ PO TBCR
40.0000 meq | EXTENDED_RELEASE_TABLET | Freq: Once | ORAL | Status: AC
  Administered 2022-01-23: 40 meq via ORAL
  Filled 2022-01-23: qty 2

## 2022-01-23 NOTE — Progress Notes (Signed)
Mobility Specialist Progress Note:   01/23/22 0930  Mobility  Activity Ambulated with assistance in room  Activity Response Tolerated well  Distance Ambulated (ft) 64 ft  $Mobility charge 1 Mobility  Level of Assistance Contact guard assist, steadying assist  Assistive Device Front wheel walker  Mobility Referral Yes    Pre- Mobility:  96 HR;  97% SpO2 During Mobility:125 HR Post Mobility:   100 HR; 94% SpO2  Pt received in chair willing to participate in mobility. No complaints of pain. Upon standing pt stated she was a little dizzy but quickly resolved. Left in chair with call bell in reach and all needs met.   [facility] [place] Surveyor, mining Chat only

## 2022-01-23 NOTE — Progress Notes (Signed)
Progress Note  Patient Name: [name] Date of Encounter: 01/23/2022  [facility] Cardiologist: Peter [name], MD   Subjective   Admitted with weakness and found to be in A-fib with RVR as well as UTI and dehydration mild bump in high-sensitivity troponin at 25>> 35>> 32.  Syncopal episode during hospitalization while on bedside commode with positive orthostatics and IV Cardizem was stopped and given fluids.  Started on Lopressor 12.5 mg twice daily yesterday to try to help control heart rate.  She has been on Eliquis and has not missed any doses in the past 4 weeks.  Remains in atrial fibrillation with heart rates in the 90s.  Inpatient Medications    Scheduled Meds:  apixaban  5 mg Oral BID   estradiol  1 Applicatorful Vaginal Once per day on Mon Wed Fri   pravastatin  40 mg Oral Daily   saccharomyces boulardii  250 mg Oral BID   Continuous Infusions:  ceFEPime (MAXIPIME) IV     PRN Meds: acetaminophen **OR** acetaminophen, ALPRAZolam   Vital Signs    Vitals:   01/22/22 1900 01/22/22 2300 01/23/22 0300 01/23/22 0729  BP:    (!) 149/79  Pulse:    97  Resp:    16  Temp: 98.1 F (36.7 C) 98.1 F (36.7 C) 98.3 F (36.8 C) 98 F (36.7 C)  TempSrc: Oral Oral Oral Oral  SpO2: 99%   96%  Weight:      Height:        Intake/Output Summary (Last 24 hours) at 01/23/2022 1004 Last data filed at 01/23/2022 0411 Gross per 24 hour  Intake 440 ml  Output 0 ml  Net 440 ml      01/19/2022   11:00 PM 01/01/2022    3:11 PM 10/28/2021    8:18 AM  Last 3 Weights  Weight (lbs) 149 lb 11.1 oz 149 lb 9.6 oz 149 lb 6.4 oz  Weight (kg) 67.9 kg 67.858 kg 67.767 kg      Telemetry    Atrial fibrillation with controlled ventricular response- Personally Reviewed  ECG    No new EKG to review- Personally Reviewed  Physical Exam   GEN: No acute distress.   Neck: No JVD Cardiac: Irregularly irregular, no murmurs, rubs, or gallops.  Respiratory: Clear to auscultation  bilaterally. GI: Soft, nontender, non-distended  MS: No edema; No deformity. Neuro:  Nonfocal  Psych: Normal affect   Labs    High Sensitivity Troponin:   Recent Labs  Lab 01/19/22 0010 01/20/22 0303 01/21/22 0556  TROPONINIHS 25* 35* 32*      Chemistry Recent Labs  Lab 01/19/22 1946 01/21/22 0018 01/21/22 1644 01/22/22 0545 01/23/22 0038  NA 138   < > 139 140 141  K 4.0   < > 3.6 3.5 3.7  CL 103   < > 107 109 112*  CO2 21*   < > 25 23 21*  GLUCOSE 120*   < > 126* 117* 113*  BUN 14   < > 25* 17 20  CREATININE 0.85   < > 1.03* 0.84 0.73  CALCIUM 9.9   < > 9.0 8.6* 8.6*  PROT 8.5*  --   --   --   --   ALBUMIN 4.6  --   --   --   --   AST 30  --   --   --   --   ALT 18  --   --   --   --  ALKPHOS 61  --   --   --   --   BILITOT 0.6  --   --   --   --   GFRNONAA >60   < > 52* >60 >60  ANIONGAP 14   < > _0 < > = values in this interval not displayed.     Hematology Recent Labs  Lab 01/21/22 0018 01/22/22 0545 01/23/22 0038  WBC 13.1* 8.6 8.7  RBC 4.54 4.21 4.12  HGB 13.7 12.7 12.3  HCT 41.1 38.5 37.9  MCV 90.5 91.4 92.0  MCH 30.2 30.2 29.9  MCHC 33.3 33.0 32.5  RDW 13.8 13.7 13.5  PLT 264 229 269    BNPNo results for input(s): "BNP", "PROBNP" in the last 168 hours.   DDimer  Recent Labs  Lab 01/19/22 0010  DDIMER 0.31     CHA2DS2-VASc Score = 6   This indicates a 9.7% annual risk of stroke. The patient's score is based upon: CHF History: 0 HTN History: 1 Diabetes History: 0 Stroke History: 2 Vascular Disease History: 0 Age Score: 2 Gender Score: 1      Radiology    No results found.  Cardiac Studies   2Decho 01/20/2022 IMPRESSIONS    1. Left ventricular ejection fraction, by estimation, is 50%. The left  ventricle has mildly decreased function. The left ventricle demonstrates  global hypokinesis. Left ventricular diastolic parameters are  indeterminate.   2. Right ventricular systolic function is normal. The right  ventricular  size is normal. There is normal pulmonary artery systolic pressure. The  estimated right ventricular systolic pressure is 71.2 mmHg.   3. Left atrial size was mildly dilated.   4. The mitral valve is normal in structure. Mild mitral valve  regurgitation. No evidence of mitral stenosis. Moderate mitral annular  calcification.   5. The aortic valve is tricuspid. There is mild calcification of the  aortic valve. Aortic valve regurgitation is trivial. No aortic stenosis is  present.   6. Aortic dilatation noted. There is mild dilatation of the aortic root,  measuring 39 mm.   7. The inferior vena cava is normal in size with greater than 50%  respiratory variability, suggesting right atrial pressure of 3 mmHg.   8. The patient is in atrial fibrillation.   Patient Profile     88 y.o. female with a history of paroxysmal atrial fibrillation on Eliquis, hypertension, hyperlipidemia, TIA, hiatal hernia s/p Nissen fundoplication, and migraines who is being seen 01/22/2022 for the evaluation of atrial fibrillation at the request of Dr. Posey Pronto.  Assessment & Plan    Paroxysmal Atrial Fibrillation Patient has a history of paroxysmal atrial fibrillation. She presented with profound weakness and was found to be in atrial fibrillation with RVR. Rates were initially in the 130s. Electrolytes within normal limits and TSH normal. Echo this admission showed normal LV function. -Had syncope on the commode during hospitalization and orthostatics were markedly positive therefore Cardizem drip was stopped as well as Lopressor -Yesterday was supposed to be placed on on low-dose Lopressor 12.5 mg twice daily cards consult note but was never ordered so I will order this today -Continue Eliquis 5 mg twice daily -Hopefully she will come out of this rhythm on her own as she is hydrated and UTI is treat. If not, can consider cardioversion. Will see if we can tentatively put her on the board for Monday. She  states she has not missed any doses of her Eliquis.  Demand Ischemia High-sensitivity troponin 25 >> 35 >> 32. Initial EKG showed diffuse ST depression that I suspect were rate related. Echo shows LVEF of 50% with global hypokinesis, normal RV, moderate MAC with mild MR, and mild dilatation of the aortic root measuring 39 mm. - Patient denies any chest pain. -Troponin elevation not consistent with ACS. Consistent with demand ischemia in setting of rapid atrial fibrillation and orthostatic hypotension. No additional inpatient ischemic evaluation necessary.   Syncope Orthostatic Hypotension Patient had a very brief syncopal episode when sitting on the side of the bed and was found to be orthostatic with BP dropping from 106/57 when supine to 75/60 when sitting. IV Cardizem and Lopressor were stopped and she was given more fluids. Repeat orthostatics yesterday had improved but were still positive with BP dropping from 134/77 when supine to 109/62 when sitting but then improved back to 119/65 when standing and dropped to 68/59 mmHg after standing for 3 minutes.  - Will defer additional IV fluids to primary team.  - Continue compression hose - Repeat orthostatic blood pressures today and if still positive will add abdominal binder   Otherwise, per primary team.    I have spent a total of 30 minutes with patient reviewing 2D echo , telemetry, EKGs, labs and examining patient as well as establishing an assessment and plan that was discussed with the patient.  > 50% of time was spent in direct patient care.     For questions or updates, please contact [place] Please consult [email or web address] for contact info under        Signed, Fransico Him, MD  01/23/2022, 10:04 AM

## 2022-01-23 NOTE — Progress Notes (Signed)
Progress Note Patient: [name] CWC:[id number] DOB: June 29, 1930 DOA: 01/19/2022  DOS: the patient was seen and examined on 01/23/2022  Brief hospital course: PMH of PAF on Eliquis, TIA, HTN, HLD, recurrent UTI.  Presents with generalized weakness.  Found to have A-fib with RVR.  Initially was on Cardizem drip.  Had minimally elevated troponin.  On IV cefepime for possible UTI.  PT OT consulted. Due to persistent orthostatics and A-fib/flutter cardiology was consulted. Assessment and Plan: Paroxysmal A-fib with RVR. Presented with generalized weakness and found to have A-fib with RVR in the ER. Started on Cardizem drip. Currently still remains in A-fib but rate controlled. Cardiology consulted as patient was not able to tolerate beta-blocker due to severe orthostatic hypotension. Started on Lopressor again on 10/7. Echocardiogram shows preserved EF 50%. Management per cardiology. Currently on Eliquis.  UTI, history of MDR E. coli Based on prior sensitivity patient is currently on IV cefepime. We will complete the antibiotic therapy for a total of 5 days. Treated with oral fosfomycin on 10/8. Start on Elsa on 10/9. Unfortunately no urine culture performed at the time of admission.  Severe orthostatic hypotension. Multiple syncope and near syncope. Blood pressure dropping down to 70 systolic from 761Y systolic on position change. Treated with IV fluid. Cosyntropin stimulation test negative. Blood pressure improving on 10/7 although still dropping from 073 systolic to 710 systolic at 0 minutes and 626R systolic at 3 minutes. We will add abdominal binder and recheck orthostatic.  Patient was also able to ambulate 64 feet with mobility tech. Continue monitoring.  HLD. Continue Pravachol.  History of TIA. Continue statin and Eliquis. It was a presented with generalized weakness does not appear to have any evidence of stroke or TIA.  HTN. Patient is on Norvasc at home currently  on hold due to the orthostasis.  Anxiety. Continue current regimen.  Subjective: Reported 2 episodes of BM today.  1 loose and 1 regular.  No abdominal pain.  No nausea no vomiting.  Still feeling better.  Ambulated with mobility tech.  No fever no chills.  Physical Exam: Vitals:   01/22/22 2300 01/23/22 0300 01/23/22 0729 01/23/22 1057  BP:   (!) 149/79 126/86  Pulse:   97 88  Resp:   16 19  Temp: 98.1 F (36.7 C) 98.3 F (36.8 C) 98 F (36.7 C) 98.1 F (36.7 C)  TempSrc: Oral Oral Oral Oral  SpO2:   96% 96%  Weight:      Height:       General: Appear in mild distress; no visible Abnormal Neck Mass Or lumps, Conjunctiva normal Cardiovascular: S1 and S2 Present, no Murmur, Respiratory: good respiratory effort, Bilateral Air entry present and CTA, no Crackles, no wheezes Abdomen: Bowel Sound present, Non tender  Extremities: no Pedal edema Neurology: alert and oriented to time, place, and person  Gait not checked due to patient safety concerns   Data Reviewed: I have Reviewed nursing notes, Vitals, and Lab results since pt's last encounter. Pertinent lab results CBC and BMP I have ordered test including CBC and BMP    Family Communication: No one at bedside.  Attempt to call the daughter, left a voicemail.  Patient reports her son will be here tomorrow.  Disposition: Status is: Inpatient Remains inpatient appropriate because: Still severely orthostatic although improving.  Still with A-fib requiring close monitoring as may require cardioversion.  Author: Berle Mull, MD 01/23/2022 6:05 PM  Please look on [email or web address] to find out who is on call.

## 2022-01-23 NOTE — Plan of Care (Signed)

## 2022-01-24 DIAGNOSIS — I10 Essential (primary) hypertension: Secondary | ICD-10-CM

## 2022-01-24 DIAGNOSIS — I4891 Unspecified atrial fibrillation: Secondary | ICD-10-CM | POA: Diagnosis not present

## 2022-01-24 DIAGNOSIS — E785 Hyperlipidemia, unspecified: Secondary | ICD-10-CM

## 2022-01-24 LAB — BASIC METABOLIC PANEL
Anion gap: 8 (ref 5–15)
BUN: 20 mg/dL (ref 8–23)
CO2: 26 mmol/L (ref 22–32)
Calcium: 9.1 mg/dL (ref 8.9–10.3)
Chloride: 107 mmol/L (ref 98–111)
Creatinine, Ser: 0.74 mg/dL (ref 0.44–1.00)
GFR, Estimated: >60 mL/min (ref >60)
Glucose, Bld: 102 mg/dL — ABNORMAL HIGH (ref 70–99)
Potassium: 3.8 mmol/L (ref 3.5–5.1)
Sodium: 141 mmol/L (ref 135–145)

## 2022-01-24 LAB — MAGNESIUM: Magnesium: 2 mg/dL (ref 1.7–2.4)

## 2022-01-24 MED ORDER — LOPERAMIDE HCL 2 MG PO CAPS
2.0000 mg | ORAL_CAPSULE | ORAL | Status: DC | PRN
  Administered 2022-01-24: 2 mg via ORAL
  Filled 2022-01-24: qty 1

## 2022-01-24 MED ORDER — FOSFOMYCIN TROMETHAMINE 3 G PO PACK
3.0000 g | PACK | Freq: Once | ORAL | Status: AC
  Administered 2022-01-24: 3 g via ORAL
  Filled 2022-01-24: qty 3

## 2022-01-24 MED ORDER — NITROFURANTOIN MONOHYD MACRO 100 MG PO CAPS
100.0000 mg | ORAL_CAPSULE | Freq: Every day | ORAL | Status: DC
  Administered 2022-01-25: 100 mg via ORAL
  Filled 2022-01-24: qty 1

## 2022-01-24 NOTE — Progress Notes (Signed)
  Progress Note Patient: [name] [id number]:[id number] DOB: [birth year] DOA: 01/19/2022  DOS: the patient was seen and examined on 01/24/2022  Brief hospital course: PMH of PAF on Eliquis, TIA, HTN, HLD, recurrent UTI.  Presents with generalized weakness.  Found to have A-fib with RVR.  Initially was on Cardizem drip.  Had minimally elevated troponin.  On IV cefepime for possible UTI.  PT OT consulted. Due to persistent orthostatics and A-fib/flutter cardiology was consulted. As of 10/8 orthostatic have improved to normal and patient has returned to normal sinus rhythm.  Social worker consulted for SNF placement, awaiting response. Assessment and Plan: Paroxysmal A-fib with RVR. Presented with generalized weakness and found to have A-fib with RVR in the ER. Started on Cardizem drip. Currently still remains in A-fib but rate controlled. Cardiology consulted as patient was not able to tolerate beta-blocker due to severe orthostatic hypotension. Started on Lopressor again on 10/7.  Now NSR. Echocardiogram shows preserved EF 50%. Management per cardiology. Currently on Eliquis.  UTI, history of MDR E. coli Based on prior sensitivity patient is currently on IV cefepime. We will complete the antibiotic therapy for a total of 5 days. Treated with oral fosfomycin on 10/8. Start on Crane on 10/9. Unfortunately no urine culture performed at the time of admission.  Severe orthostatic hypotension. Multiple syncope and near syncope. Blood pressure dropping down to 70 systolic from 073X systolic on position change. Treated with IV fluid. Cosyntropin stimulation test negative. As of 10/8 orthostatic vitals returned to normal. Continue monitoring.  HLD. Continue Pravachol.  History of TIA. Continue statin and Eliquis. It was a presented with generalized weakness does not appear to have any evidence of stroke or TIA.  HTN. Patient is on Norvasc at home currently on hold due to the  orthostasis.  Anxiety. Continue current regimen.  Subjective: Reports some loose BM.  No nausea or vomiting.  No abdominal pain.  Physical Exam: Vitals:   01/24/22 1315 01/24/22 1742 01/24/22 1820 01/24/22 1941  BP: (!) 135/51 (!) 127/92  (!) 154/82  Pulse: 98   65  Resp: 16 (!) '27 18 20  '$ Temp: 97.9 F (36.6 C) 97.8 F (36.6 C)  98.3 F (36.8 C)  TempSrc: Oral Oral  Oral  SpO2: 97% 97%    Weight:      Height:       General: Appear in mild distress; no visible Abnormal Neck Mass Or lumps, Conjunctiva normal Cardiovascular: S1 and S2 Present, aortic systolic  Murmur, Respiratory: good respiratory effort, Bilateral Air entry present and CTA, no Crackles, no wheezes Abdomen: Bowel Sound present, Non tender Extremities: no Pedal edema Neurology: alert and oriented to Self, Place and time.  Data Reviewed: I have Reviewed nursing notes, Vitals, and Lab results since pt's last encounter. Pertinent lab results CBC and BMP I have discussed pt's care plan and test results with cardiology.    Family Communication: No one at bedside.  Discussed with son on the phone.  Disposition: Status is: Inpatient Remains inpatient appropriate because: Medical stable.  Now awaiting transfer to SNF.  Author: Berle Mull, MD 01/24/2022 7:45 PM  Please look on [email or web address] to find out who is on call.

## 2022-01-24 NOTE — Progress Notes (Addendum)
Progress Note  Patient Name: [name] Date of Encounter: 01/24/2022  [facility] Cardiologist: Peter [name], MD   Subjective   Admitted with weakness and found to be in A-fib with RVR as well as UTI and dehydration mild bump in high-sensitivity troponin at 25>> 35>> 32.  Syncopal episode during hospitalization while on bedside commode with positive orthostatics and IV Cardizem was stopped and given fluids.  Started on Lopressor 12.5 mg twice daily to try to help control heart rate.  She has been on Eliquis and has not missed any doses in the past 4 weeks.    Converted to normal sinus rhythm this morning Inpatient Medications    Scheduled Meds:  apixaban  5 mg Oral BID   estradiol  1 Applicatorful Vaginal Once per day on Mon Wed Fri   fosfomycin  3 g Oral Once   metoprolol tartrate  12.5 mg Oral BID   pravastatin  40 mg Oral Daily   saccharomyces boulardii  250 mg Oral BID   Continuous Infusions:   PRN Meds: acetaminophen **OR** acetaminophen, ALPRAZolam   Vital Signs    Vitals:   01/23/22 2222 01/23/22 2353 01/24/22 0308 01/24/22 0700  BP: (!) 145/75 (!) 134/45 (!) 123/58 135/61  Pulse: 86 (!) 58 65   Resp: _0 (!) 22  Temp: 97.9 F (36.6 C) 97.9 F (36.6 C) 97.8 F (36.6 C) 97.8 F (36.6 C)  TempSrc: Oral Oral Oral Oral  SpO2: 91%     Weight:      Height:       No intake or output data in the 24 hours ending 01/24/22 1038     01/19/2022   11:00 PM 01/01/2022    3:11 PM 10/28/2021    8:18 AM  Last 3 Weights  Weight (lbs) 149 lb 11.1 oz 149 lb 9.6 oz 149 lb 6.4 oz  Weight (kg) 67.9 kg 67.858 kg 67.767 kg      Telemetry    Normal sinus rhythm- Personally Reviewed  ECG    No new EKG to review- Personally Reviewed  Physical Exam   GEN: Well nourished, well developed in no acute distress HEENT: Normal NECK: No JVD; No carotid bruits LYMPHATICS: No lymphadenopathy CARDIAC:RRR, no murmurs, rubs, gallops RESPIRATORY:  Clear to  auscultation without rales, wheezing or rhonchi  ABDOMEN: Soft, non-tender, non-distended MUSCULOSKELETAL:  No edema; No deformity  SKIN: Warm and dry NEUROLOGIC:  Alert and oriented x 3 PSYCHIATRIC:  Normal affect   Labs    High Sensitivity Troponin:   Recent Labs  Lab 01/19/22 0010 01/20/22 0303 01/21/22 0556  TROPONINIHS 25* 35* 32*      Chemistry Recent Labs  Lab 01/19/22 1946 01/21/22 0018 01/21/22 1644 01/22/22 0545 01/23/22 0038  NA 138   < > 139 140 141  K 4.0   < > 3.6 3.5 3.7  CL 103   < > 107 109 112*  CO2 21*   < > 25 23 21*  GLUCOSE 120*   < > 126* 117* 113*  BUN 14   < > 25* 17 20  CREATININE 0.85   < > 1.03* 0.84 0.73  CALCIUM 9.9   < > 9.0 8.6* 8.6*  PROT 8.5*  --   --   --   --   ALBUMIN 4.6  --   --   --   --   AST 30  --   --   --   --  ALT 18  --   --   --   --   ALKPHOS 61  --   --   --   --   BILITOT 0.6  --   --   --   --   GFRNONAA >60   < > 52* >60 >60  ANIONGAP 14   < > _0 < > = values in this interval not displayed.     Hematology Recent Labs  Lab 01/21/22 0018 01/22/22 0545 01/23/22 0038  WBC 13.1* 8.6 8.7  RBC 4.54 4.21 4.12  HGB 13.7 12.7 12.3  HCT 41.1 38.5 37.9  MCV 90.5 91.4 92.0  MCH 30.2 30.2 29.9  MCHC 33.3 33.0 32.5  RDW 13.8 13.7 13.5  PLT 264 229 269    BNPNo results for input(s): "BNP", "PROBNP" in the last 168 hours.   DDimer  Recent Labs  Lab 01/19/22 0010  DDIMER 0.31     CHA2DS2-VASc Score = 6   This indicates a 9.7% annual risk of stroke. The patient's score is based upon: CHF History: 0 HTN History: 1 Diabetes History: 0 Stroke History: 2 Vascular Disease History: 0 Age Score: 2 Gender Score: 1      Radiology    No results found.  Cardiac Studies   2Decho 01/20/2022 IMPRESSIONS    1. Left ventricular ejection fraction, by estimation, is 50%. The left  ventricle has mildly decreased function. The left ventricle demonstrates  global hypokinesis. Left ventricular diastolic  parameters are  indeterminate.   2. Right ventricular systolic function is normal. The right ventricular  size is normal. There is normal pulmonary artery systolic pressure. The  estimated right ventricular systolic pressure is 13.2 mmHg.   3. Left atrial size was mildly dilated.   4. The mitral valve is normal in structure. Mild mitral valve  regurgitation. No evidence of mitral stenosis. Moderate mitral annular  calcification.   5. The aortic valve is tricuspid. There is mild calcification of the  aortic valve. Aortic valve regurgitation is trivial. No aortic stenosis is  present.   6. Aortic dilatation noted. There is mild dilatation of the aortic root,  measuring 39 mm.   7. The inferior vena cava is normal in size with greater than 50%  respiratory variability, suggesting right atrial pressure of 3 mmHg.   8. The patient is in atrial fibrillation.   Patient Profile     88 y.o. female with a history of paroxysmal atrial fibrillation on Eliquis, hypertension, hyperlipidemia, TIA, hiatal hernia s/p Nissen fundoplication, and migraines who is being seen 01/22/2022 for the evaluation of atrial fibrillation at the request of Dr. Posey Pronto.  Assessment & Plan    Paroxysmal Atrial Fibrillation Patient has a history of paroxysmal atrial fibrillation. She presented with profound weakness and was found to be in atrial fibrillation with RVR. Rates were initially in the 130s. Electrolytes within normal limits and TSH normal. Echo this admission showed normal LV function. -Had syncope on the commode during hospitalization and orthostatics were markedly positive therefore Cardizem drip was stopped as well as Lopressor -Now on Lopressor 12.5 mg twice daily and blood pressure is tolerating well -Converted to normal sinus rhythm this morning -Continue Eliquis 5 mg twice daily and Lopressor 12.5 mg twice daily   Demand Ischemia High-sensitivity troponin 25 >> 35 >> 32. Initial EKG showed diffuse ST  depression that I suspect were rate related. Echo shows LVEF of 50% with global hypokinesis, normal RV, moderate MAC with  mild MR, and mild dilatation of the aortic root measuring 39 mm. - Patient denies any chest pain. -Troponin elevation not consistent with ACS. Consistent with demand ischemia in setting of rapid atrial fibrillation and orthostatic hypotension.  -No additional inpatient ischemic evaluation necessary.   Syncope Orthostatic Hypotension Patient had a very brief syncopal episode when sitting on the side of the bed and was found to be orthostatic with BP dropping from 106/57 when supine to 75/60 when sitting. IV Cardizem and Lopressor were stopped and she was given more fluids. Repeat orthostatics 10/6 had improved but were still positive with BP dropping from 134/77 when supine to 109/62 when sitting but then improved  -Orthostatics this morning much improved -Will defer additional IV fluids to primary team.  - Continue compression hose   Otherwise, per primary team.    For questions or updates, please contact [place] Please consult [email or web address] for contact info under        Signed, Fransico Him, MD  01/24/2022, 10:38 AM

## 2022-01-24 NOTE — Progress Notes (Signed)
Mobility Specialist Progress Note:   01/24/22 1033  Mobility  Activity Ambulated with assistance in hallway  Activity Response Tolerated well  Distance Ambulated (ft) 340 ft  $Mobility charge 1 Mobility  Level of Assistance Contact guard assist, steadying assist  Assistive Device Front wheel walker   Pt received in bed willing to participate in mobility. No complaints of pain. Left in chair with call bell in reach and all needs met.   [facility] Surveyor, mining Chat only

## 2022-01-25 ENCOUNTER — Encounter (HOSPITAL_COMMUNITY)
Admission: EM | Disposition: A | Discharge status: Skilled Nursing Facility | Payer: Self-pay | Source: Home / Self Care | Attending: Internal Medicine

## 2022-01-25 DIAGNOSIS — I951 Orthostatic hypotension: Secondary | ICD-10-CM | POA: Diagnosis not present

## 2022-01-25 DIAGNOSIS — I4891 Unspecified atrial fibrillation: Secondary | ICD-10-CM | POA: Diagnosis not present

## 2022-01-25 DIAGNOSIS — I10 Essential (primary) hypertension: Secondary | ICD-10-CM | POA: Diagnosis not present

## 2022-01-25 LAB — BASIC METABOLIC PANEL
Anion gap: 11 (ref 5–15)
BUN: 21 mg/dL (ref 8–23)
CO2: 23 mmol/L (ref 22–32)
Calcium: 8.9 mg/dL (ref 8.9–10.3)
Chloride: 107 mmol/L (ref 98–111)
Creatinine, Ser: 0.81 mg/dL (ref 0.44–1.00)
GFR, Estimated: >60 mL/min (ref >60)
Glucose, Bld: 97 mg/dL (ref 70–99)
Potassium: 3.9 mmol/L (ref 3.5–5.1)
Sodium: 141 mmol/L (ref 135–145)

## 2022-01-25 LAB — MAGNESIUM: Magnesium: 1.9 mg/dL (ref 1.7–2.4)

## 2022-01-25 SURGERY — CARDIOVERSION
Anesthesia: Monitor Anesthesia Care

## 2022-01-25 NOTE — Progress Notes (Signed)
Physical Therapy Treatment Patient Details Name: [name] MRN: [id number] DOB: [birth year] Today's Date: 01/25/2022   History of Present Illness Pt is an 88 y/o female presented to [facility] on 01/19/22 with progressive weakness and confusion in setting of likely UTI. Pt also noted with a fib with RVR. PMH: HTN, hiatal hernia, migraine, a fib, TIA, PNA, recurrent UTIs    PT Comments    Pt required min assist bed mobility, min assist transfers, and min guard assist amb 200' with RW. Pt presents with slow, guarded gait. Good sitting balance, and poor standing balance. Fatigue noted at end of session. Pt returned to supine in bed. Plan is for rehab at [facility].     Recommendations for follow up therapy are one component of a multi-disciplinary discharge planning process, led by the attending physician.  Recommendations may be updated based on patient status, additional functional criteria and insurance authorization.  Follow Up Recommendations  Skilled nursing-short term rehab (<3 hours/day) (at [facility]) Can patient physically be transported by private vehicle: Yes   Assistance Recommended at Discharge Frequent or constant Supervision/Assistance  Patient can return home with the following A little help with walking and/or transfers;A little help with bathing/dressing/bathroom;Assist for transportation;Assistance with cooking/housework   Equipment Recommendations  None recommended by PT (Pt reports she has a rollator.)    Recommendations for Other Services       Precautions / Restrictions Precautions Precautions: Fall Precaution Comments: syncopal episode on BSC 10/5, due to orthostatic. BP now stable.     Mobility  Bed Mobility Overal bed mobility: Needs Assistance Bed Mobility: Supine to Sit, Sit to Supine     Supine to sit: Min assist, HOB elevated Sit to supine: Min guard   General bed mobility comments: +rail, assist to elevate trunk    Transfers Overall transfer  level: Needs assistance Equipment used: Rolling walker (2 wheels) Transfers: Sit to/from Stand Sit to Stand: Min assist           General transfer comment: cues for hand placement, assist to power up    Ambulation/Gait Ambulation/Gait assistance: Min guard Gait Distance (Feet): 200 Feet Assistive device: Rolling walker (2 wheels) Gait Pattern/deviations: Step-through pattern, Decreased stride length, Trunk flexed Gait velocity: decreased Gait velocity interpretation: 1.31 - 2.62 ft/sec, indicative of limited community ambulator   General Gait Details: slow, guarded gait. No LOB   Stairs             Wheelchair Mobility    Modified Rankin (Stroke Patients Only)       Balance Overall balance assessment: Needs assistance Sitting-balance support: No upper extremity supported, Feet supported Sitting balance-Leahy Scale: Good     Standing balance support: Bilateral upper extremity supported, Reliant on assistive device for balance, During functional activity Standing balance-Leahy Scale: Poor                              Cognition Arousal/Alertness: Awake/alert Behavior During Therapy: WFL for tasks assessed/performed Overall Cognitive Status: Impaired/Different from baseline Area of Impairment: Problem solving, Safety/judgement, Memory                     Memory: Decreased short-term memory   Safety/Judgement: Decreased awareness of deficits, Decreased awareness of safety   Problem Solving: Slow processing, Difficulty sequencing, Requires verbal cues          Exercises      General Comments General comments (skin integrity, edema, etc.): VSS  on RA      Pertinent Vitals/Pain Pain Assessment Pain Assessment: Faces Faces Pain Scale: Hurts a little bit Pain Location: abdomen Pain Descriptors / Indicators: Cramping Pain Intervention(s): Monitored during session    Home Living                          Prior Function             PT Goals (current goals can now be found in the care plan section) Acute Rehab PT Goals Patient Stated Goal: return to ILF Progress towards PT goals: Progressing toward goals    Frequency    Min 3X/week      PT Plan Current plan remains appropriate    Co-evaluation              AM-PAC PT "6 Clicks" Mobility   Outcome Measure  Help needed turning from your back to your side while in a flat bed without using bedrails?: None Help needed moving from lying on your back to sitting on the side of a flat bed without using bedrails?: A Little Help needed moving to and from a bed to a chair (including a wheelchair)?: A Little Help needed standing up from a chair using your arms (e.g., wheelchair or bedside chair)?: A Little Help needed to walk in hospital room?: A Little Help needed climbing 3-5 steps with a railing? : A Lot 6 Click Score: 18    End of Session Equipment Utilized During Treatment: Gait belt Activity Tolerance: Patient tolerated treatment well Patient left: in bed;with call bell/phone within reach Nurse Communication: Mobility status PT Visit Diagnosis: Other abnormalities of gait and mobility (R26.89);Muscle weakness (generalized) (M62.81)     Time: 3893-7342 PT Time Calculation (min) (ACUTE ONLY): 16 min  Charges:  $Gait Training: 8-22 mins                     Lorrin Goodell, PT  Office # [phone] Pager [phone]    [name] 01/25/2022, 9:47 AM

## 2022-01-25 NOTE — Progress Notes (Signed)
Received msg from weekend team to [employer] inbox to cancel DCCV as patient had converted yesterday. Endo aware. Further recs forthcoming from rounding team.

## 2022-01-25 NOTE — Progress Notes (Signed)
Rounding Note    Patient Name: [name] Date of Encounter: 01/25/2022  [place] Cardiologist: Peter [name], MD   Subjective   Waiting SNF.  Maintaining sinus rhyhtm Feeling well. No chest pain, sob or palpitations.    Inpatient Medications    Scheduled Meds:  apixaban  5 mg Oral BID   estradiol  1 Applicatorful Vaginal Once per day on Mon Wed Fri   metoprolol tartrate  12.5 mg Oral BID   nitrofurantoin (macrocrystal-monohydrate)  100 mg Oral QHS   pravastatin  40 mg Oral Daily   saccharomyces boulardii  250 mg Oral BID   Continuous Infusions:  PRN Meds: acetaminophen **OR** acetaminophen, ALPRAZolam, loperamide   Vital Signs    Vitals:   01/24/22 1820 01/24/22 1941 01/25/22 0425 01/25/22 0735  BP:  (!) 154/82 (!) 120/104 109/81  Pulse:  65 (!) 59 63  Resp: 18 20 (!) 21 (!) 26  Temp:  98.3 F (36.8 C) 98.1 F (36.7 C) 97.6 F (36.4 C)  TempSrc:  Oral Oral Oral  SpO2:      Weight:      Height:        Intake/Output Summary (Last 24 hours) at 01/25/2022 1050 Last data filed at 01/24/2022 1700 Gross per 24 hour  Intake 480 ml  Output --  Net 480 ml      01/19/2022   11:00 PM 01/01/2022    3:11 PM 10/28/2021    8:18 AM  Last 3 Weights  Weight (lbs) 149 lb 11.1 oz 149 lb 9.6 oz 149 lb 6.4 oz  Weight (kg) 67.9 kg 67.858 kg 67.767 kg      Telemetry    Sinus bradycardia in 50s - Personally Reviewed  ECG    N/a  Physical Exam   GEN: No acute distress.   Neck: No JVD Cardiac: RRR, no murmurs, rubs, or gallops.  Respiratory: Clear to auscultation bilaterally. GI: Soft, nontender, non-distended  MS: + edema with stocking; No deformity. Neuro:  Nonfocal  Psych: Normal affect   Labs    High Sensitivity Troponin:   Recent Labs  Lab 01/19/22 0010 01/20/22 0303 01/21/22 0556  TROPONINIHS 25* 35* 32*     Chemistry Recent Labs  Lab 01/19/22 1946 01/21/22 0018 01/23/22 0038 01/24/22 1146 01/25/22 0020  NA 138   < > 141 141  141  K 4.0   < > 3.7 3.8 3.9  CL 103   < > 112* 107 107  CO2 21*   < > 21* 26 23  GLUCOSE 120*   < > 113* 102* 97  BUN 14   < > _0 CREATININE 0.85   < > 0.73 0.74 0.81  CALCIUM 9.9   < > 8.6* 9.1 8.9  MG 2.1   < > 2.0 2.0 1.9  PROT 8.5*  --   --   --   --   ALBUMIN 4.6  --   --   --   --   AST 30  --   --   --   --   ALT 18  --   --   --   --   ALKPHOS 61  --   --   --   --   BILITOT 0.6  --   --   --   --   GFRNONAA >60   < > >60 >60 >60  ANIONGAP 14   < > _1 < > =  values in this interval not displayed.    Lipids No results for input(s): "CHOL", "TRIG", "HDL", "LABVLDL", "LDLCALC", "CHOLHDL" in the last 168 hours.  Hematology Recent Labs  Lab 01/21/22 0018 01/22/22 0545 01/23/22 0038  WBC 13.1* 8.6 8.7  RBC 4.54 4.21 4.12  HGB 13.7 12.7 12.3  HCT 41.1 38.5 37.9  MCV 90.5 91.4 92.0  MCH 30.2 30.2 29.9  MCHC 33.3 33.0 32.5  RDW 13.8 13.7 13.5  PLT 264 229 269   Thyroid  Recent Labs  Lab 01/19/22 1846 01/19/22 1946  TSH 2.428  --   FREET4  --  0.84    BNPNo results for input(s): "BNP", "PROBNP" in the last 168 hours.  DDimer  Recent Labs  Lab 01/19/22 0010  DDIMER 0.31     Radiology    No results found.  Cardiac Studies   2Decho 01/20/2022 IMPRESSIONS    1. Left ventricular ejection fraction, by estimation, is 50%. The left  ventricle has mildly decreased function. The left ventricle demonstrates  global hypokinesis. Left ventricular diastolic parameters are  indeterminate.   2. Right ventricular systolic function is normal. The right ventricular  size is normal. There is normal pulmonary artery systolic pressure. The  estimated right ventricular systolic pressure is 50.0 mmHg.   3. Left atrial size was mildly dilated.   4. The mitral valve is normal in structure. Mild mitral valve  regurgitation. No evidence of mitral stenosis. Moderate mitral annular  calcification.   5. The aortic valve is tricuspid. There is mild calcification of  the  aortic valve. Aortic valve regurgitation is trivial. No aortic stenosis is  present.   6. Aortic dilatation noted. There is mild dilatation of the aortic root,  measuring 39 mm.   7. The inferior vena cava is normal in size with greater than 50%  respiratory variability, suggesting right atrial pressure of 3 mmHg.   8. The patient is in atrial fibrillation  Patient Profile     88 y.o. female with a history of paroxysmal atrial fibrillation on Eliquis, hypertension, hyperlipidemia, TIA, hiatal hernia s/p Nissen fundoplication, and migraines who is being seen for the evaluation of atrial fibrillation at the request of Dr. Posey Pronto.  Assessment & Plan    Paroxysmal Atrial Fibrillation Patient has a history of paroxysmal atrial fibrillation. She presented with profound weakness and was found to be in atrial fibrillation with RVR. Rates were initially in the 130s. Electrolytes within normal limits and TSH normal. Echo this admission showed normal LV function. -Had syncope on the commode during hospitalization and orthostatics were markedly positive therefore Cardizem drip was stopped as well as Lopressor - Plan was for DCCV but converted to sinus rhythm over weekend. Maintaining sinus rhythm.  -Continue Eliquis 5 mg twice daily and Lopressor 12.5 mg twice daily   Demand Ischemia High-sensitivity troponin 25 >> 35 >> 32. Initial EKG showed diffuse ST depression that I suspect were rate related. Echo shows LVEF of 50% with global hypokinesis, normal RV, moderate MAC with mild MR, and mild dilatation of the aortic root measuring 39 mm. - Patient denies any chest pain. -Troponin elevation not consistent with ACS. Consistent with demand ischemia in setting of rapid atrial fibrillation and orthostatic hypotension.  -No additional inpatient ischemic evaluation necessary.   Syncope Orthostatic Hypotension Patient had a very brief syncopal episode when sitting on the side of the bed and was found to  be orthostatic with BP dropping from 106/57 when supine to 75/60 when sitting. IV Cardizem  and Lopressor were stopped and she was given more fluids. Repeat orthostatics 10/6 had improved but were still positive with BP dropping from 134/77 when supine to 109/62 when sitting but then improved  - Continue compression hose - Waiting SNF  Cardiology will sign off. Continue Eliquis 58m BID, Lopressor 12.550mBID and Pravastatin 4074md. Discontinued amlodipine. Will arrange follow up.    For questions or updates, please contact [place] consult [email or web address] for contact info under        Signed, BLeanor KailA  01/25/2022, 10:50 AM

## 2022-01-25 NOTE — TOC Initial Note (Signed)
Transition of Care [facility]) - Initial/Assessment Note    Patient Details  Name: [name] MRN: [id number] Date of Birth: 05-27-1930  [facility] CM/SW Contact:    Tresa Endo Phone Number: 01/25/2022, 2:40 PM  Clinical Narrative:                 Pt is from [facility], PT has recommended SNF. Pt contacted [facility] to inquire about a SNF bed at [facility], they have a bed a will accept pt tomorrow. CSW has updated pt son about DC plan, pt son is agreeable.        Patient Goals and CMS Choice        Expected Discharge Plan and Services                                                Prior Living Arrangements/Services                       Activities of Daily Living      Permission Sought/Granted                  Emotional Assessment              Admission diagnosis:  Weakness [R53.1] Atrial fibrillation with rapid ventricular response ([place]) [I48.91] Atrial fibrillation with RVR ([place]) [I48.91] Urinary tract infection without hematuria, site unspecified [N39.0] UTI (urinary tract infection) [N39.0] Patient Active Problem List   Diagnosis Date Noted   Atrial fibrillation with rapid ventricular response ([place]) 01/19/2022   Generalized weakness 01/19/2022   Anxiety 01/19/2022   Secondary hypercoagulable state ([place]) 08/28/2020   Glucose intolerance 12/04/2019   S/P Nissen fundoplication (without gastrostomy tube) procedure 08/20/2016   Abnormal TSH 06/26/2016   Large hiatal hernia 06/23/2016   Paroxysmal atrial fibrillation ([place]) 11/30/2012   Thoracic aorta atherosclerosis ([place]) 10/25/2012   Essential hypertension, benign 09/29/2012   UTI (urinary tract infection) 08/27/2012   History of TIA (transient ischemic attack) 08/26/2012   H/O chronic cystitis 08/26/2012   Hyperlipidemia with target LDL less than 130 06/29/2011   Osteopenia 06/29/2011   Allergic rhinitis, mild 06/29/2011   PCP:  [name],  MD Pharmacy:   [id number] - [place], [place] - [address] AT [address] [place] [address] Phone: [phone] Fax: [phone]  [facility] [place], [place] - [address] [place] [place] [address] Phone: [phone] Fax: [phone]     Social Determinants of Health (SDOH) Interventions    Readmission Risk Interventions     No data to display

## 2022-01-25 NOTE — Progress Notes (Addendum)
Mobility Specialist Progress Note    01/25/22 1224  Mobility  Activity Transferred to/from [facility]  Level of Assistance Contact guard assist, steadying assist  Assistive Device Other (Comment) (HHA)  Distance Ambulated (ft) 4 ft (2+2)  Activity Response Tolerated well  Mobility Referral Yes  $Mobility charge 1 Mobility   Pre-Mobility: 69 HR Post-Mobility: 71 HR  Pt received in bed requesting to use bathroom. Pt deferred further ambulation c/o headache. Had void. Returned to bed with call bell in reach.   Hildred Alamin Mobility Specialist

## 2022-01-25 NOTE — Progress Notes (Signed)
  Progress Note Patient: [name] IRW:[id number] DOB: Jun 30, 1930 DOA: 01/19/2022  DOS: the patient was seen and examined on 01/25/2022  Brief hospital course: PMH of PAF on Eliquis, TIA, HTN, HLD, recurrent UTI.  Presents with generalized weakness.  Found to have A-fib with RVR.  Initially was on Cardizem drip.  Had minimally elevated troponin.  On IV cefepime for possible UTI.  PT OT consulted. Due to persistent orthostatics and A-fib/flutter cardiology was consulted. As of 10/8 orthostatic have improved to normal and patient has returned to normal sinus rhythm.  Social worker consulted for SNF placement, awaiting response. Assessment and Plan: Paroxysmal A-fib with RVR. Presented with generalized weakness and found to have A-fib with RVR in the ER. Started on Cardizem drip. Currently still remains in A-fib but rate controlled. Cardiology consulted as patient was not able to tolerate beta-blocker due to severe orthostatic hypotension. Started on Lopressor again on 10/7.  Now NSR. Echocardiogram shows preserved EF 50%. Management per cardiology. Currently on Eliquis.   UTI, history of MDR E. coli Based on prior sensitivity patient is currently on IV cefepime. We will complete the antibiotic therapy for a total of 5 days. Treated with oral fosfomycin on 10/8. Start on [place] on 10/9. Unfortunately no urine culture performed at the time of admission.   Severe orthostatic hypotension. Multiple syncope and near syncope. Blood pressure dropping down to 70 systolic from 761P systolic on position change. Treated with IV fluid. Cosyntropin stimulation test negative. As of 10/8 orthostatic vitals returned to normal. Continue monitoring.  HTN. Patient is on Norvasc at home currently on hold. Continuing Lopressor   HLD. Continue Pravachol.   History of TIA. Continue statin and Eliquis. It was a presented with generalized weakness does not appear to have any evidence of stroke or  TIA.   HTN. Patient is on Norvasc at home currently on hold due to the orthostasis.   Anxiety. Continue current regimen.  Antibiotic induced diarrhea. Imodium as needed. Continue probiotics.  Subjective: No nausea no vomiting no fever no chills.  No acute complaints.   Physical Exam: Vitals:   01/25/22 0425 01/25/22 0735 01/25/22 1101 01/25/22 1602  BP: (!) 120/104 109/81 (!) 159/79 (!) 150/81  Pulse: (!) 59 63 63 66  Resp: (!) 21 (!) '26 19 14  '$ Temp: 98.1 F (36.7 C) 97.6 F (36.4 C) 97.9 F (36.6 C) 98 F (36.7 C)  TempSrc: Oral Oral Oral Oral  SpO2:      Weight:      Height:       General: Appear in mild distress; no visible Abnormal Neck Mass Or lumps, Conjunctiva normal Cardiovascular: S1 and S2 Present, aortic systolic  Murmur, Respiratory: good respiratory effort, Bilateral Air entry present and CTA, no Crackles, no wheezes Abdomen: Bowel Sound present, Non tender  Extremities: no Pedal edema Neurology: alert and oriented to time, place, and person  Gait not checked due to patient safety concerns   Data Reviewed: I have Reviewed nursing notes, Vitals, and Lab results since pt's last encounter. Pertinent lab results BMP I have discussed pt's care plan and test results with cardiology.   Family Communication: No one at bedside  Disposition: Status is: Inpatient Remains inpatient appropriate because: Awaiting placement.  Medically stable since 10/8. Place TED hose Start: 01/23/22 0824 apixaban (ELIQUIS) tablet 5 mg   Level of care: Med-Surg Discontinue Telemetry.  Author: Berle Mull, MD 01/25/2022 6:44 PM Please look on [email or web address] to find out who is on call.

## 2022-01-25 NOTE — Progress Notes (Signed)
Occupational Therapy Treatment Patient Details Name: [name] MRN: [id number] DOB: 03-Jan-1931 Today's Date: 01/25/2022   History of present illness Pt is an 88 y/o female presented to [facility] on 01/19/22 with progressive weakness and confusion in setting of likely UTI. Pt also noted with a fib with RVR. PMH: HTN, hiatal hernia, migraine, a fib, TIA, PNA, recurrent UTIs   OT comments  [name] is progressing well. Overall she was min G for all mobility and toileting transfers. She needed min cues for safety, problem solving, attention and clothing manipulation as she forgot to pull her undergarments down prior to toileting. OT to continue to follow acutely. POC remains appropriate.    Recommendations for follow up therapy are one component of a multi-disciplinary discharge planning process, led by the attending physician.  Recommendations may be updated based on patient status, additional functional criteria and insurance authorization.    Follow Up Recommendations  Skilled nursing-short term rehab (<3 hours/day)    Assistance Recommended at Discharge Frequent or constant Supervision/Assistance  Patient can return home with the following  A lot of help with walking and/or transfers;A lot of help with bathing/dressing/bathroom   Equipment Recommendations  None recommended by OT       Precautions / Restrictions Precautions Precautions: Fall Precaution Comments: syncopal episode on BSC 10/5, due to orthostatic. BP now stable. Restrictions Weight Bearing Restrictions: No       Mobility Bed Mobility Overal bed mobility: Needs Assistance Bed Mobility: Supine to Sit, Sit to Supine     Supine to sit: HOB elevated, Min guard          Transfers Overall transfer level: Needs assistance Equipment used: Rolling walker (2 wheels) Transfers: Sit to/from Stand Sit to Stand: Min guard                 Balance Overall balance assessment: Needs assistance Sitting-balance support: No  upper extremity supported, Feet supported Sitting balance-Leahy Scale: Good     Standing balance support: Bilateral upper extremity supported, Reliant on assistive device for balance, During functional activity Standing balance-Leahy Scale: Poor                             ADL either performed or assessed with clinical judgement   ADL Overall ADL's : Needs assistance/impaired                         Toilet Transfer: Min guard;Ambulation;BSC/3in1;Rolling walker (2 wheels) Toilet Transfer Details (indicate cue type and reason): BSC over toilet Toileting- Clothing Manipulation and Hygiene: Minimal assistance Toileting - Clothing Manipulation Details (indicate cue type and reason): min A for clothing, supervision for hygiene     Functional mobility during ADLs: Min guard;Rolling walker (2 wheels) General ADL Comments: continues to have some confusion, self-distracted this date. min G for all mobility.    Extremity/Trunk Assessment Upper Extremity Assessment Upper Extremity Assessment: Generalized weakness            Vision   Vision Assessment?: No apparent visual deficits          Cognition Arousal/Alertness: Awake/alert Behavior During Therapy: WFL for tasks assessed/performed Overall Cognitive Status: Impaired/Different from baseline Area of Impairment: Safety/judgement, Awareness, Problem solving                         Safety/Judgement: Decreased awareness of safety, Decreased awareness of deficits Awareness: Emergent Problem Solving: Slow  processing, Difficulty sequencing, Requires verbal cues General Comments: follow simple one step commands, slow processing, limited problem solving and safety insight              General Comments VSS on Ra    Pertinent Vitals/ Pain       Pain Assessment Pain Assessment: Faces Faces Pain Scale: No hurt Pain Intervention(s): Monitored during session   Frequency  Min 2X/week         Progress Toward Goals  OT Goals(current goals can now be found in the care plan section)  Progress towards OT goals: Progressing toward goals  Acute Rehab OT Goals Patient Stated Goal: back home soon OT Goal Formulation: With patient/family Time For Goal Achievement: 02/03/22 Potential to Achieve Goals: Good ADL Goals Pt Will Perform Lower Body Dressing: with modified independence;sit to/from stand Pt Will Transfer to Toilet: ambulating;with supervision Pt Will Perform Toileting - Clothing Manipulation and hygiene: sit to/from stand;sitting/lateral leans;with supervision Additional ADL Goal #1: Pt to attend to ADL/functional tasks > 5 min with no more than min verbal cues  Plan         AM-PAC OT "6 Clicks" Daily Activity     Outcome Measure   Help from another person eating meals?: None Help from another person taking care of personal grooming?: A Little Help from another person toileting, which includes using toliet, bedpan, or urinal?: A Little Help from another person bathing (including washing, rinsing, drying)?: A Lot Help from another person to put on and taking off regular upper body clothing?: A Little Help from another person to put on and taking off regular lower body clothing?: A Lot 6 Click Score: 17    End of Session Equipment Utilized During Treatment: Gait belt;Rolling walker (2 wheels)  OT Visit Diagnosis: Unsteadiness on feet (R26.81);Muscle weakness (generalized) (M62.81);Other symptoms and signs involving cognitive function   Activity Tolerance Patient tolerated treatment well   Patient Left in chair;with chair alarm set;with call bell/phone within reach   Nurse Communication Mobility status        Time: 5027-7412 OT Time Calculation (min): 23 min  Charges: OT General Charges $OT Visit: 1 Visit OT Treatments $Self Care/Home Management : 23-37 mins    [name] 01/25/2022, 4:57 PM

## 2022-01-25 NOTE — NC FL2 (Signed)
[place] LEVEL OF CARE SCREENING TOOL     IDENTIFICATION  Patient Name: [name] Birthdate: [birth year] Sex: female Admission Date (Current Location): 01/19/2022  [facility] and [place] Number:  Herbalist and Address:  [facility], [place] [address], [place],  [address]      Provider Number: [id number]  Attending Physician Name and Address:  Lavina Hamman, MD  Relative Name and Phone Number:  Kaplan,Dr. Belenda Cruise (Son)   [phone]    Current Level of Care: Hospital Recommended Level of Care: [place] Prior Approval Number:    Date Approved/Denied:   PASRR Number: [id number]  Discharge Plan: SNF    Current Diagnoses: Patient Active Problem List   Diagnosis Date Noted   Atrial fibrillation with rapid ventricular response ([place]) 01/19/2022   Generalized weakness 01/19/2022   Anxiety 01/19/2022   Secondary hypercoagulable state ([place]) 08/28/2020   Glucose intolerance 12/04/2019   S/P Nissen fundoplication (without gastrostomy tube) procedure 08/20/2016   Abnormal TSH 06/26/2016   Large hiatal hernia 06/23/2016   Paroxysmal atrial fibrillation ([facility]) 11/30/2012   Thoracic aorta atherosclerosis ([place]) 10/25/2012   Essential hypertension, benign 09/29/2012   UTI (urinary tract infection) 08/27/2012   History of TIA (transient ischemic attack) 08/26/2012   H/O chronic cystitis 08/26/2012   Hyperlipidemia with target LDL less than 130 06/29/2011   Osteopenia 06/29/2011   Allergic rhinitis, mild 06/29/2011    Orientation RESPIRATION BLADDER Height & Weight     Self, Time, Place, Situation  Normal Incontinent, External catheter Weight: 149 lb 11.1 oz (67.9 kg) Height:  '5\' 5"'$  (165.1 cm)  BEHAVIORAL SYMPTOMS/MOOD NEUROLOGICAL BOWEL NUTRITION STATUS      Continent Diet (See DC Summary)  AMBULATORY STATUS COMMUNICATION OF NEEDS Skin   Limited Assist Verbally Normal                       Personal  Care Assistance Level of Assistance  Dressing, Feeding, Bathing Bathing Assistance: Limited assistance Feeding assistance: Independent Dressing Assistance: Limited assistance     Functional Limitations Info  Sight, Hearing, Speech Sight Info: Adequate Hearing Info: Adequate Speech Info: Adequate    SPECIAL CARE FACTORS FREQUENCY  PT (By licensed PT), OT (By licensed OT)     PT Frequency: 5x a week OT Frequency: 5x a week            Contractures Contractures Info: Not present    Additional Factors Info  Code Status, Allergies Code Status Info: DNR Allergies Info: Lactose   Lisinopril   Bactrim (Sulfamethoxazole-trimethoprim)           Current Medications (01/25/2022):  This is the current hospital active medication list Current Facility-Administered Medications  Medication Dose Route Frequency Provider Last Rate Last Admin   acetaminophen (TYLENOL) tablet 650 mg  650 mg Oral Q6H PRN Shela Leff, MD   650 mg at 01/25/22 1330   Or   acetaminophen (TYLENOL) suppository 650 mg  650 mg Rectal Q6H PRN Shela Leff, MD       ALPRAZolam Duanne Moron) tablet 0.25 mg  0.25 mg Oral BID PRN Shela Leff, MD   0.25 mg at 01/24/22 2043   apixaban (ELIQUIS) tablet 5 mg  5 mg Oral BID Shela Leff, MD   5 mg at 01/25/22 6606   estradiol (ESTRACE) vaginal cream 1 Applicatorful  1 Applicatorful Vaginal Once per day on Mon Wed Fri Lavina Hamman, MD   1 Applicatorful at  01/25/22 1108   loperamide (IMODIUM) capsule 2 mg  2 mg Oral PRN Lavina Hamman, MD   2 mg at 01/24/22 1723   metoprolol tartrate (LOPRESSOR) tablet 12.5 mg  12.5 mg Oral BID Fransico Him R, MD   12.5 mg at 01/25/22 9937   nitrofurantoin (macrocrystal-monohydrate) (MACROBID) capsule 100 mg  100 mg Oral QHS Lavina Hamman, MD       pravastatin (PRAVACHOL) tablet 40 mg  40 mg Oral Daily Shela Leff, MD   40 mg at 01/25/22 1696   saccharomyces boulardii (FLORASTOR) capsule 250 mg  250 mg Oral BID  Lavina Hamman, MD   250 mg at 01/25/22 0908     Discharge Medications: Please see discharge summary for a list of discharge medications.  Relevant Imaging Results:  Relevant Lab Results:   Additional Information SSN: [id number]  Reece Agar, LCSWA

## 2022-01-25 NOTE — Plan of Care (Signed)

## 2022-01-26 ENCOUNTER — Encounter: Payer: Self-pay | Admitting: Internal Medicine

## 2022-01-26 ENCOUNTER — Non-Acute Institutional Stay (SKILLED_NURSING_FACILITY): Payer: Medicare Other | Admitting: Internal Medicine

## 2022-01-26 DIAGNOSIS — E785 Hyperlipidemia, unspecified: Secondary | ICD-10-CM

## 2022-01-26 DIAGNOSIS — K580 Irritable bowel syndrome with diarrhea: Secondary | ICD-10-CM

## 2022-01-26 DIAGNOSIS — N390 Urinary tract infection, site not specified: Secondary | ICD-10-CM

## 2022-01-26 DIAGNOSIS — N3000 Acute cystitis without hematuria: Secondary | ICD-10-CM | POA: Diagnosis not present

## 2022-01-26 DIAGNOSIS — I480 Paroxysmal atrial fibrillation: Secondary | ICD-10-CM

## 2022-01-26 DIAGNOSIS — R55 Syncope and collapse: Secondary | ICD-10-CM | POA: Diagnosis not present

## 2022-01-26 DIAGNOSIS — I10 Essential (primary) hypertension: Secondary | ICD-10-CM | POA: Diagnosis not present

## 2022-01-26 DIAGNOSIS — I4891 Unspecified atrial fibrillation: Secondary | ICD-10-CM | POA: Diagnosis not present

## 2022-01-26 LAB — BASIC METABOLIC PANEL
Anion gap: 9 (ref 5–15)
BUN: 13 mg/dL (ref 8–23)
CO2: 24 mmol/L (ref 22–32)
Calcium: 8.7 mg/dL — ABNORMAL LOW (ref 8.9–10.3)
Chloride: 106 mmol/L (ref 98–111)
Creatinine, Ser: 0.65 mg/dL (ref 0.44–1.00)
GFR, Estimated: >60 mL/min (ref >60)
Glucose, Bld: 107 mg/dL — ABNORMAL HIGH (ref 70–99)
Potassium: 3.8 mmol/L (ref 3.5–5.1)
Sodium: 139 mmol/L (ref 135–145)

## 2022-01-26 LAB — MAGNESIUM: Magnesium: 2 mg/dL (ref 1.7–2.4)

## 2022-01-26 MED ORDER — SACCHAROMYCES BOULARDII 250 MG PO CAPS: 250.0000 mg | ORAL_CAPSULE | Freq: Two times a day (BID) | ORAL | 0 refills | Status: AC

## 2022-01-26 MED ORDER — ALPRAZOLAM 0.25 MG PO TABS: 0.2500 mg | ORAL_TABLET | Freq: Two times a day (BID) | ORAL | 0 refills | Status: DC | PRN

## 2022-01-26 MED ORDER — METOPROLOL TARTRATE 25 MG PO TABS: 12.5000 mg | ORAL_TABLET | Freq: Two times a day (BID) | ORAL | 0 refills | Status: DC

## 2022-01-26 NOTE — Discharge Summary (Signed)
Physician Discharge Summary   Patient: [name] MRN: [id number] DOB: [birth year]  Admit date:     01/19/2022  Discharge date: 01/26/22  Discharge Physician: Berle Mull  PCP: [name], MD  Recommendations at discharge:  Follow up with pcp and Cardiology as recommended   Follow-up Information     Almyra Deforest, PA Follow up on 02/08/2022.   Specialties: Cardiology, Radiology Why: '@2'$ :20pm for hospital follow up Contact information: [address] [place] [address] [phone]         [name], MD. Schedule an appointment as soon as possible for a visit in 1 week(s).   Specialty: Internal Medicine Contact information: [place] [address] [phone]                Discharge Diagnoses: Principal Problem:   Atrial fibrillation with rapid ventricular response (HCC) Active Problems:   Hyperlipidemia with target LDL less than 130   History of TIA (transient ischemic attack)   UTI (urinary tract infection)   Essential hypertension, benign   Generalized weakness   Anxiety  Hospital Course: PMH of PAF on Eliquis, TIA, HTN, HLD, recurrent UTI.  Presents with generalized weakness.  Found to have A-fib with RVR.  Initially was on Cardizem drip.  Had minimally elevated troponin.  On IV cefepime for possible UTI.  PT OT consulted. Due to persistent orthostatics and A-fib/flutter cardiology was consulted. As of 10/8 orthostatic have improved to normal and patient has returned to normal sinus rhythm.  Social worker consulted for SNF placement.  Assessment and Plan  Paroxysmal A-fib with RVR. Presented with generalized weakness and found to have A-fib with RVR in the ER. Started on Cardizem drip. Currently still remains in A-fib but rate controlled. Cardiology consulted as patient was not able to tolerate beta-blocker due to severe orthostatic hypotension. Started on Lopressor again at a lower dose of 12.5 mg bid on 10/7.   Now NSR. Echocardiogram shows preserved EF 50%. Management per cardiology. Currently on Eliquis.   UTI, history of MDR E. coli Based on prior sensitivity patient is currently on IV cefepime. Completed antibiotic therapy for a total of 5 days. Treated with oral fosfomycin on 10/8. Start on [place] on 10/9. Unfortunately no urine culture performed at the time of admission.   Severe orthostatic hypotension. Multiple syncope and near syncope. Blood pressure dropping down to 70 systolic from 834H systolic on position change. Treated with IV fluid. Cosyntropin stimulation test negative. As of 10/8 orthostatic vitals returned to normal.   HTN. Patient is on Norvasc at home currently on hold. Continuing Lopressor   HLD. Continue Pravachol.   History of TIA. Continue statin and Eliquis. presented with generalized weakness  Does not appear to have any evidence of stroke or TIA.   HTN. Patient is on Norvasc at home currently on hold due to the orthostasis.   Anxiety. Continue current regimen.   Antibiotic induced diarrhea. Imodium as needed. Continue probiotics.  Consultants:  Cardiology   Procedures performed:  Echocardiogram  DISCHARGE MEDICATION: Allergies as of 01/26/2022       Reactions   Lactose Other (See Comments)   Dairy Sensitivity  Dairy Sensitivity    Lisinopril Other (See Comments)   DIZZY   Bactrim [sulfamethoxazole-trimethoprim] Rash        Medication List     STOP taking these medications    amLODipine 5 MG tablet Commonly known as: NORVASC       TAKE these  medications    acetaminophen 500 MG tablet Commonly known as: TYLENOL Take 500-1,000 mg by mouth every 6 (six) hours as needed (for headache/pain.).   ALPRAZolam 0.25 MG tablet Commonly known as: XANAX Take 1 tablet (0.25 mg total) by mouth 2 (two) times daily as needed for anxiety.   CALCIUM CARBONATE-VIT D-MIN PO Take 2 tablets by mouth 3 (three) times a week.   COSAMIN  DS PO Take 1 tablet by mouth 2 (two) times daily.   CRANBERRY PO Take 1 tablet by mouth daily.   Eliquis 5 MG Tabs tablet Generic drug: apixaban TAKE 1 TABLET BY MOUTH TWICE DAILY What changed: how much to take   estradiol 0.1 MG/GM vaginal cream Commonly known as: ESTRACE Place 1 Applicatorful vaginally 3 (three) times a week.   Fluad Quadrivalent 0.5 ML injection Generic drug: influenza vaccine adjuvanted Inject into the muscle.   loperamide 2 MG tablet Commonly known as: IMODIUM A-D Take 2 mg by mouth as needed for diarrhea or loose stools.   loratadine 10 MG tablet Commonly known as: CLARITIN Take 10 mg by mouth daily as needed for allergies.   metoprolol tartrate 25 MG tablet Commonly known as: LOPRESSOR Take 0.5 tablets (12.5 mg total) by mouth 2 (two) times daily. What changed: how much to take   nitrofurantoin (macrocrystal-monohydrate) 100 MG capsule Commonly known as: MACROBID Take 100 mg by mouth at bedtime.   OCUSOFT EYELID CLEANSING EX Apply 1 application  topically as needed (dry eyes).   ondansetron 4 MG tablet Commonly known as: Zofran Take 1 tablet (4 mg total) by mouth every 8 (eight) hours as needed for nausea or vomiting.   pravastatin 40 MG tablet Commonly known as: PRAVACHOL TAKE 1 TABLET(40 MG) BY MOUTH DAILY What changed:  how much to take how to take this when to take this additional instructions   Probiotic Daily Caps Take 2 capsules by mouth daily.   saccharomyces boulardii 250 MG capsule Commonly known as: FLORASTOR Take 1 capsule (250 mg total) by mouth 2 (two) times daily for 7 days.   SYSTANE OP Place 1 drop into both eyes 2 (two) times daily as needed (for dry eyes).       Disposition: SNF Diet recommendation: Cardiac diet  Discharge Exam: Vitals:   01/25/22 1602 01/25/22 1900 01/25/22 2110 01/26/22 0727  BP: (!) 150/81 (!) 147/80 (!) 154/57 (!) 155/48  Pulse: 66 (!) 57 60 62  Resp: 14 16    Temp: 98 F (36.7  C) 98.3 F (36.8 C)  98.1 F (36.7 C)  TempSrc: Oral Oral  Oral  SpO2:  91%  92%  Weight:      Height:       General: Appear in no distress; no visible Abnormal Neck Mass Or lumps, Conjunctiva normal Cardiovascular: S1 and S2 Present, no Murmur, Respiratory: good respiratory effort, Bilateral Air entry present and CTA, no Crackles, no wheezes Abdomen: Bowel Sound present, Non tender  Extremities: no Pedal edema Neurology: alert and oriented to time, place, and person  Filed Weights   01/19/22 2300  Weight: 67.9 kg   Condition at discharge: stable  The results of significant diagnostics from this hospitalization (including imaging, microbiology, ancillary and laboratory) are listed below for reference.   Imaging Studies: DG Abd 2 Views  Result Date: 01/20/2022 CLINICAL DATA:  Abdominal discomfort. EXAM: ABDOMEN - 2 VIEW COMPARISON:  Abdominal radiograph and CT 06/22/2016 FINDINGS: Gas and stool are present in the colon to the level of the  rectum. There is mild gaseous distension of the cecum/ascending colon. No dilated loops of bowel are seen to suggest obstruction. Aortic atherosclerosis and moderate lumbar levoscoliosis are noted. IMPRESSION: No evidence of bowel obstruction. Electronically Signed   By: Logan Bores M.D.   On: 01/20/2022 15:04   ECHOCARDIOGRAM COMPLETE  Result Date: 01/20/2022    ECHOCARDIOGRAM REPORT   Patient Name:   GLADIS SOLEY Date of Exam: 01/20/2022 Medical Rec #:  [id number]     Height:       65.0 in Accession #:    [id number]    Weight:       149.7 lb Date of Birth:  Apr 30, 1930      BSA:          1.749 m Patient Age:    57 years      BP:           147/62 mmHg Patient Gender: F             HR:           76 bpm. Exam Location:  Inpatient Procedure: 2D Echo Indications:     atrial fibrillation  History:         Patient has prior history of Echocardiogram examinations, most                  recent 06/28/2016. TIA, Arrythmias:Atrial Fibrillation; Risk                   Factors:Hypertension and Dyslipidemia.  Sonographer:     Harvie Junior Referring Phys:  [id number] [place] Diagnosing Phys: Franki Monte  Sonographer Comments: Technically difficult study due to poor echo windows. Image acquisition challenging due to respiratory motion and talking. IMPRESSIONS  1. Left ventricular ejection fraction, by estimation, is 50%. The left ventricle has mildly decreased function. The left ventricle demonstrates global hypokinesis. Left ventricular diastolic parameters are indeterminate.  2. Right ventricular systolic function is normal. The right ventricular size is normal. There is normal pulmonary artery systolic pressure. The estimated right ventricular systolic pressure is 78.9 mmHg.  3. Left atrial size was mildly dilated.  4. The mitral valve is normal in structure. Mild mitral valve regurgitation. No evidence of mitral stenosis. Moderate mitral annular calcification.  5. The aortic valve is tricuspid. There is mild calcification of the aortic valve. Aortic valve regurgitation is trivial. No aortic stenosis is present.  6. Aortic dilatation noted. There is mild dilatation of the aortic root, measuring 39 mm.  7. The inferior vena cava is normal in size with greater than 50% respiratory variability, suggesting right atrial pressure of 3 mmHg.  8. The patient is in atrial fibrillation. FINDINGS  Left Ventricle: Left ventricular ejection fraction, by estimation, is 50%. The left ventricle has mildly decreased function. The left ventricle demonstrates global hypokinesis. The left ventricular internal cavity size was normal in size. There is no left ventricular hypertrophy. Left ventricular diastolic parameters are indeterminate. Right Ventricle: The right ventricular size is normal. No increase in right ventricular wall thickness. Right ventricular systolic function is normal. There is normal pulmonary artery systolic pressure. The tricuspid regurgitant velocity is 2.05 m/s, and   with an assumed right atrial pressure of 3 mmHg, the estimated right ventricular systolic pressure is 38.1 mmHg. Left Atrium: Left atrial size was mildly dilated. Right Atrium: Right atrial size was normal in size. Pericardium: Trivial pericardial effusion is present. Mitral Valve: The mitral valve is normal in structure. There is mild calcification  of the mitral valve leaflet(s). Moderate mitral annular calcification. Mild mitral valve regurgitation. No evidence of mitral valve stenosis. Tricuspid Valve: The tricuspid valve is normal in structure. Tricuspid valve regurgitation is trivial. Aortic Valve: The aortic valve is tricuspid. There is mild calcification of the aortic valve. Aortic valve regurgitation is trivial. Aortic regurgitation PHT measures 577 msec. No aortic stenosis is present. Aortic valve mean gradient measures 2.3 mmHg. Aortic valve peak gradient measures 3.9 mmHg. Aortic valve area, by VTI measures 3.25 cm. Pulmonic Valve: The pulmonic valve was normal in structure. Pulmonic valve regurgitation is trivial. Aorta: Aortic dilatation noted. There is mild dilatation of the aortic root, measuring 39 mm. Venous: The inferior vena cava is normal in size with greater than 50% respiratory variability, suggesting right atrial pressure of 3 mmHg. IAS/Shunts: No atrial level shunt detected by color flow Doppler.  LEFT VENTRICLE PLAX 2D LVIDd:         4.40 cm     Diastology LVIDs:         3.00 cm     LV e' medial:    7.18 cm/s LV PW:         0.90 cm     LV E/e' medial:  17.3 LV IVS:        0.90 cm     LV e' lateral:   7.94 cm/s LVOT diam:     2.10 cm     LV E/e' lateral: 15.7 LV SV:         56 LV SV Index:   32 LVOT Area:     3.46 cm  LV Volumes (MOD) LV vol d, MOD A2C: 55.8 ml LV vol d, MOD A4C: 50.4 ml LV vol s, MOD A2C: 22.2 ml LV vol s, MOD A4C: 27.8 ml LV SV MOD A2C:     33.6 ml LV SV MOD A4C:     50.4 ml LV SV MOD BP:      28.2 ml RIGHT VENTRICLE RV Basal diam:  2.40 cm RV Mid diam:    2.00 cm RV S  prime:     16.10 cm/s TAPSE (M-mode): 1.9 cm LEFT ATRIUM           Index        RIGHT ATRIUM           Index LA diam:      3.60 cm 2.06 cm/m   RA Area:     13.30 cm LA Vol (A2C): 54.0 ml 30.88 ml/m  RA Volume:   23.30 ml  13.32 ml/m LA Vol (A4C): 68.6 ml 39.22 ml/m  AORTIC VALVE                    PULMONIC VALVE AV Area (Vmax):    2.78 cm     PV Vmax:       0.86 m/s AV Area (Vmean):   2.82 cm     PV Peak grad:  2.9 mmHg AV Area (VTI):     3.25 cm AV Vmax:           98.43 cm/s AV Vmean:          67.933 cm/s AV VTI:            0.171 m AV Peak Grad:      3.9 mmHg AV Mean Grad:      2.3 mmHg LVOT Vmax:         79.05 cm/s LVOT Vmean:  55.275 cm/s LVOT VTI:          0.160 m LVOT/AV VTI ratio: 0.94 AI PHT:            577 msec  AORTA Ao Root diam: 3.90 cm Ao Asc diam:  3.60 cm MITRAL VALVE                TRICUSPID VALVE MV Area (PHT): 3.93 cm     TR Peak grad:   16.8 mmHg MV Decel Time: 193 msec     TR Vmax:        205.00 cm/s MR Peak grad: 58.2 mmHg MR Vmax:      381.50 cm/s   SHUNTS MV E velocity: 124.27 cm/s  Systemic VTI:  0.16 m MV A velocity: 37.73 cm/s   Systemic Diam: 2.10 cm MV E/A ratio:  3.29 Dalton McleanMD Electronically signed by Franki Monte Signature Date/Time: 01/20/2022/1:39:40 PM    Final (Updated)    DG Chest Portable 1 View  Result Date: 01/19/2022 CLINICAL DATA:  Atrial fibrillation.  Operative ventricular rate. EXAM: PORTABLE CHEST 1 VIEW COMPARISON:  AP chest 12/27/2019, chest two views 08/16/2016 FINDINGS: Cardiac silhouette is again mildly enlarged. Mediastinal contours are within normal limits. Moderate calcification within the aortic arch. Increased lucencies within the upper lungs again suggesting chronic emphysematous changes. No pleural effusion or pneumothorax. Moderate multilevel degenerative disc changes of the thoracic spine. IMPRESSION: 1. No active disease. 2. Chronic emphysematous changes of the upper lungs. Electronically Signed   By: Yvonne Kendall M.D.   On:  01/19/2022 19:38    Microbiology: Results for orders placed or performed during the hospital encounter of 01/19/22  MRSA Next Gen by PCR, Nasal     Status: None   Collection Time: 01/20/22 11:16 AM   Specimen: Nasal Mucosa; Nasal Swab  Result Value Ref Range Status   MRSA by PCR Next Gen NOT DETECTED NOT DETECTED Final    Comment: (NOTE) The GeneXpert MRSA Assay (FDA approved for NASAL specimens only), is one component of a comprehensive MRSA colonization surveillance program. It is not intended to diagnose MRSA infection nor to guide or monitor treatment for MRSA infections. Test performance is not FDA approved in patients less than 59 years old. Performed at [facility], [place] [address], [place], [place] [address]    Labs: CBC: Recent Labs  Lab 01/19/22 1946 01/21/22 0018 01/22/22 0545 01/23/22 0038  WBC 8.9 13.1* 8.6 8.7  NEUTROABS 6.4  --   --   --   HGB 15.9* 13.7 12.7 12.3  HCT 47.7* 41.1 38.5 37.9  MCV 92.4 90.5 91.4 92.0  PLT 263 264 229 604   Basic Metabolic Panel: Recent Labs  Lab 01/22/22 0545 01/23/22 0038 01/24/22 1146 01/25/22 0020 01/26/22 0105  NA 140 141 141 141 139  K 3.5 3.7 3.8 3.9 3.8  CL 109 112* 107 107 106  CO2 23 21* '26 23 24  '$ GLUCOSE 540* 113* 102* 97 107*  BUN '17 20 20 21 13  '$ CREATININE 0.84 0.73 0.74 0.81 0.65  CALCIUM 8.6* 8.6* 9.1 8.9 8.7*  MG 2.1 2.0 2.0 1.9 2.0   Liver Function Tests: Recent Labs  Lab 01/19/22 1946  AST 30  ALT 18  ALKPHOS 61  BILITOT 0.6  PROT 8.5*  ALBUMIN 4.6   CBG: No results for input(s): "GLUCAP" in the last 168 hours.  Discharge time spent: greater than 30 minutes.  Signed: Berle Mull, MD [facility]

## 2022-01-26 NOTE — Progress Notes (Signed)
This encounter was created in error - please disregard.

## 2022-01-26 NOTE — TOC Transition Note (Signed)
Transition of Care [facility]) - CM/SW Discharge Note   Patient Details  Name: [name] MRN: [id number] Date of Birth: July 07, 1930  Transition of Care [facility] CM/SW Contact:  Bethann Berkshire, [place] Phone Number: 01/26/2022, 10:46 AM   Clinical Narrative:     CSW called Butch Penny at [facility] to confirm DC. She confirmed they have received DC summary and fl2. CSW did not see documents sent in hub so sent fl2 and DC summary in hub as well. Butch Penny states that pt's son is aware of DC and is on the way to the hospital to transport pt. Notified RN who stated son just left with pt.   Patient will DC to: [facility] Anticipated DC date: 01/26/22 Transport by: Son Belenda Cruise   Per MD patient ready for DC to [facility] SNF. RN, patient, patient's family, and facility notified of DC. Discharge Summary and FL2 sent to facility. RN to call report prior to discharge [phone]). DC packet on chart. Ambulance transport requested for patient.   CSW will sign off for now as social work intervention is no longer needed. Please consult [place] again if new needs arise.    Final next level of care: Skilled Nursing Facility Barriers to Discharge: No Barriers Identified   Patient Goals and CMS Choice        Discharge Placement              Patient chooses bed at: [facility] Patient to be transferred to facility by: Son, Belenda Cruise   Patient and family notified of of transfer: 01/26/22  Discharge Plan and Services                                     Social Determinants of Health (SDOH) Interventions     Readmission Risk Interventions     No data to display

## 2022-01-26 NOTE — Progress Notes (Signed)
Provider:   Location:      Place of Service:     PCP: Mahlon Gammon, MD Patient Care Team: Mahlon Gammon, MD as PCP - General (Internal Medicine) Swaziland, [name], MD as PCP - Cardiology (Cardiology) Swaziland, [name], MD as Consulting Physician (Cardiology) Vanessa Barbara, NP as Nurse Practitioner Noel Christmas, MD as Consulting Physician (Urology) Danice Goltz, [place] as Physician Assistant (Cardiology) Ronnald Nian, MD as Consulting Physician (Family Medicine) Noel Christmas, MD as Consulting Physician (Urology)  Extended Emergency Contact Information Primary Emergency Contact: Laski,Dr. Alphia Moh, [place] Home Phone: [phone] Mobile Phone: [phone] Relation: Son Secondary Emergency Contact: [facility] Address: [address]          [place], [place] [address] [place] Home Phone: [phone] Relation: Daughter  Code Status:  Goals of Care: Advanced Directive information    01/26/2022    3:23 PM  Advanced Directives  Does Patient Have a Medical Advance Directive? Yes  Type of Advance Directive Out of facility DNR (pink MOST or yellow form)  Does patient want to make changes to medical advance directive? No - Patient declined      No chief complaint on file.   HPI: Patient is a 88 y.o. female seen today for admission to SNF  Patient admitted in hospital from 10/03-10/10 for A Fib with RVR   Patient has a history of PAF on Eliquis, recurrent UTI and is on Macrobid, HLD, hypertension, IBS with diarrhea, anxiety.  Patient states that she was in her apartment and suddenly she was unable to get up.  She denies any dizziness palpitations chest pain.  [facility] was called and she was transferred to ED in ED she was found to have A-fib with RVR Her UA was positive for nitrites and leukocyte.  She was treated with IV cefepime. For her A-fib patient was treated with IV Cardizem.   But then patient had a syncopal episode.  Her Cardizem was discontinued and she was started on low-dose of beta-blocker.  She converted to sinus rhythm. Echo showed normal EF with global hypokinesis Elevated troponins were thought to be due to demand ischemia. Her amlodipine was discontinued  Patient is in rehab for therapy.  She is doing well denies any dizziness planning to use walker now.  She is walking with no assist. Had no acute issues today   Past Medical History:  Diagnosis Date   Allergic rhinitis    Arthritis    "hands, little in my feet" (06/22/2016)   Candidiasis of vulva and vagina    Cystocele, midline    Disorder of bone and cartilage, unspecified    Fibroids    Hypertension    Large hiatal hernia 06/23/2016   Migraine    "none in the last few years" (06/22/2016)   Osteopenia    Other chronic cystitis    Other sign and symptom in breast    Paroxysmal atrial fibrillation (HCC) 11/30/2012   Pneumonia    "I've had walking pneumonia a couple times"  (06/22/2016)   Pneumonia dx'd 06/15/2016   Rectal incontinence    Stroke (HCC) 2014   hx of mini stroke    TIA (transient ischemic attack) 2015   Past Surgical History:  Procedure Laterality Date   CARDIOVERSION N/A 06/29/2016   Procedure: CARDIOVERSION;  Surgeon: Jake Bathe, MD;  Location: [facility] OR;  Service: Cardiovascular;  Laterality: N/A;   CATARACT EXTRACTION W/ INTRAOCULAR LENS IMPLANT Left    DILATION AND CURETTAGE OF UTERUS     hiatel hernia  08/20/2016   HYSTEROSCOPY WITH D & C  03/16/1999   INSERTION OF MESH N/A 08/20/2016   Procedure: INSERTION OF MESH;  Surgeon: Axel Filler, MD;  Location: [facility] ORS;  Service: General;  Laterality: N/A;   TEE WITHOUT CARDIOVERSION N/A 11/06/2012   Procedure: TRANSESOPHAGEAL ECHOCARDIOGRAM (TEE);  Surgeon: Lewayne Bunting, MD;  Location: [facility] ENDOSCOPY;  Service: Cardiovascular;  Laterality: N/A;   TONSILLECTOMY  1937   VAGINAL HYSTERECTOMY  09/2005   Anterior repair;  Removal of urethral caruncle./notes 09/02/2015    reports that she has never smoked. She has never used smokeless tobacco. She reports current alcohol use of about 1.0 standard drink of alcohol per week. She reports that she does not use drugs. Social History   Socioeconomic History   Marital status: Widowed    Spouse name: Not on file   Number of children: 2   Years of education: Not on file   Highest education level: Not on file  Occupational History   Not on file  Tobacco Use   Smoking status: Never   Smokeless tobacco: Never   Tobacco comments:    Never smoke 10/01/21  Vaping Use   Vaping Use: Never used  Substance and Sexual Activity   Alcohol use: Yes    Alcohol/week: 1.0 standard drink of alcohol    Types: 1 Glasses of wine per week    Comment: one glass of wine once a month 10/01/21   Drug use: No   Sexual activity: Not Currently  Other Topics Concern   Not on file  Social History Narrative   Patient is an independent resident at [facility] and lives by herself-no pets.   Past profession was a Futures trader, Diplomatic Services operational officer, and Engineer, technical sales.   Exercise by walking everyday.    Patient has a living will. POA and DNR.    Social Determinants of Health   Financial Resource Strain: Low Risk  (12/14/2020)   Overall Financial Resource Strain (CARDIA)    Difficulty of Paying Living Expenses: Not hard at all  Food Insecurity: No Food Insecurity (12/14/2020)   Hunger Vital Sign    Worried About Running Out of Food in the Last Year: Never true    Ran Out of Food in the Last Year: Never true  Transportation Needs: No Transportation Needs (12/14/2020)   PRAPARE - Administrator, Civil Service (Medical): No    Lack of Transportation (Non-Medical): No  Physical Activity: Insufficiently Active (12/14/2020)   Exercise Vital Sign    Days of Exercise per Week: 7 days    Minutes of Exercise per Session: 10 min  Stress: Stress Concern Present (12/14/2020)   [employer] of  Occupational Health - Occupational Stress Questionnaire    Feeling of Stress : To some extent  Social Connections: Moderately Isolated (12/14/2020)   Social Connection and Isolation Panel [NHANES]    Frequency of Communication with Friends and Family: Three times a week    Frequency of Social Gatherings with Friends and Family: Three times a week    Attends Religious Services: More than 4 times per year    Active Member of Clubs or Organizations: No    Attends Banker Meetings: Never    Marital Status: Widowed  Intimate Partner Violence: Not At Risk (12/14/2020)   Humiliation, Afraid, Rape, and Kick questionnaire    Fear  of Current or Ex-Partner: No    Emotionally Abused: No    Physically Abused: No    Sexually Abused: No    Functional Status Survey:    Family History  Problem Relation Age of Onset   Cancer Mother    Hypertension Mother    Heart disease Father    Heart disease Brother    Emphysema Brother     Health Maintenance  Topic Date Due   COVID-19 Vaccine (5 - Moderna series) 06/02/2021   TETANUS/TDAP  06/15/2026   Pneumonia Vaccine 33+ Years old  Completed   INFLUENZA VACCINE  Completed   DEXA SCAN  Completed   Zoster Vaccines- Shingrix  Completed   HPV VACCINES  Aged Out   MAMMOGRAM  Discontinued    Allergies  Allergen Reactions   Lactose Other (See Comments)    Dairy Sensitivity  Dairy Sensitivity     Lisinopril Other (See Comments)    DIZZY   Bactrim [Sulfamethoxazole-Trimethoprim] Rash    Outpatient Encounter Medications as of 01/26/2022  Medication Sig   acetaminophen (TYLENOL) 500 MG tablet Take 500-1,000 mg by mouth every 6 (six) hours as needed (for headache/pain.).   ALPRAZolam (XANAX) 0.25 MG tablet Take 1 tablet (0.25 mg total) by mouth 2 (two) times daily as needed for anxiety.   CALCIUM CARBONATE-VIT D-MIN PO Take 2 tablets by mouth 3 (three) times a week.    CRANBERRY PO Take 1 tablet by mouth daily.   ELIQUIS 5 MG TABS  tablet TAKE 1 TABLET BY MOUTH TWICE DAILY (Patient taking differently: Take 5 mg by mouth 2 (two) times daily.)   estradiol (ESTRACE) 0.1 MG/GM vaginal cream Place 1 Applicatorful vaginally 3 (three) times a week.   Eyelid Cleansers (OCUSOFT EYELID CLEANSING EX) Apply 1 application  topically as needed (dry eyes).   Glucosamine-Chondroitin (COSAMIN DS PO) Take 1 tablet by mouth 2 (two) times daily.   loperamide (IMODIUM A-D) 2 MG tablet Take 2 mg by mouth as needed for diarrhea or loose stools.   loratadine (CLARITIN) 10 MG tablet Take 10 mg by mouth daily as needed for allergies.    metoprolol tartrate (LOPRESSOR) 25 MG tablet Take 0.5 tablets (12.5 mg total) by mouth 2 (two) times daily.   nitrofurantoin, macrocrystal-monohydrate, (MACROBID) 100 MG capsule Take 100 mg by mouth at bedtime.   ondansetron (ZOFRAN) 4 MG tablet Take 1 tablet (4 mg total) by mouth every 8 (eight) hours as needed for nausea or vomiting.   Polyethyl Glycol-Propyl Glycol (SYSTANE OP) Place 1 drop into both eyes 2 (two) times daily as needed (for dry eyes).    pravastatin (PRAVACHOL) 40 MG tablet TAKE 1 TABLET(40 MG) BY MOUTH DAILY (Patient taking differently: Take 40 mg by mouth daily.)   Probiotic Product (PROBIOTIC DAILY) CAPS Take 2 capsules by mouth daily.   saccharomyces boulardii (FLORASTOR) 250 MG capsule Take 1 capsule (250 mg total) by mouth 2 (two) times daily for 7 days.   [DISCONTINUED] influenza vaccine adjuvanted (FLUAD QUADRIVALENT) 0.5 ML injection Inject into the muscle.   [DISCONTINUED] acetaminophen (TYLENOL) suppository 650 mg    [DISCONTINUED] acetaminophen (TYLENOL) tablet 650 mg    [DISCONTINUED] ALPRAZolam (XANAX) tablet 0.25 mg    [DISCONTINUED] apixaban (ELIQUIS) tablet 5 mg    [DISCONTINUED] estradiol (ESTRACE) vaginal cream 1 Applicatorful    [DISCONTINUED] loperamide (IMODIUM) capsule 2 mg    [DISCONTINUED] metoprolol tartrate (LOPRESSOR) tablet 12.5 mg    [DISCONTINUED] nitrofurantoin  (macrocrystal-monohydrate) (MACROBID) capsule 100 mg    [DISCONTINUED]  pravastatin (PRAVACHOL) tablet 40 mg    [DISCONTINUED] saccharomyces boulardii (FLORASTOR) capsule 250 mg    No facility-administered encounter medications on file as of 01/26/2022.    Review of Systems  Constitutional:  Negative for activity change and appetite change.  HENT: Negative.    Respiratory:  Negative for cough and shortness of breath.   Cardiovascular:  Positive for leg swelling.  Gastrointestinal:  Negative for constipation.  Genitourinary: Negative.   Musculoskeletal:  Positive for gait problem. Negative for arthralgias and myalgias.  Skin: Negative.   Neurological:  Positive for weakness. Negative for dizziness.  Psychiatric/Behavioral:  Negative for confusion, dysphoric mood and sleep disturbance.     There were no vitals filed for this visit. There is no height or weight on file to calculate BMI. Physical Exam Vitals reviewed.  Constitutional:      Appearance: Normal appearance.  HENT:     Head: Normocephalic.     Nose: Nose normal.     Mouth/Throat:     Mouth: Mucous membranes are moist.     Pharynx: Oropharynx is clear.  Eyes:     Pupils: Pupils are equal, round, and reactive to light.  Cardiovascular:     Rate and Rhythm: Normal rate and regular rhythm.     Pulses: Normal pulses.     Heart sounds: Normal heart sounds. No murmur heard. Pulmonary:     Effort: Pulmonary effort is normal.     Breath sounds: Normal breath sounds.  Abdominal:     General: Abdomen is flat. Bowel sounds are normal.     Palpations: Abdomen is soft.  Musculoskeletal:        General: Swelling present.     Cervical back: Neck supple.  Skin:    General: Skin is warm.  Neurological:     General: No focal deficit present.     Mental Status: She is alert and oriented to person, place, and time.  Psychiatric:        Mood and Affect: Mood normal.        Thought Content: Thought content normal.     Labs  reviewed: Basic Metabolic Panel: Recent Labs    01/24/22 1146 01/25/22 0020 01/26/22 0105  NA 141 141 139  K 3.8 3.9 3.8  CL 107 107 106  CO2 26 23 24   GLUCOSE 102* 97 107*  BUN 20 21 13   CREATININE 0.74 0.81 0.65  CALCIUM 9.1 8.9 8.7*  MG 2.0 1.9 2.0   Liver Function Tests: Recent Labs    01/19/22 1946  AST 30  ALT 18  ALKPHOS 61  BILITOT 0.6  PROT 8.5*  ALBUMIN 4.6   No results for input(s): "LIPASE", "AMYLASE" in the last 8760 hours. No results for input(s): "AMMONIA" in the last 8760 hours. CBC: Recent Labs    01/19/22 1946 01/21/22 0018 01/22/22 0545 01/23/22 0038  WBC 8.9 13.1* 8.6 8.7  NEUTROABS 6.4  --   --   --   HGB 15.9* 13.7 12.7 12.3  HCT 47.7* 41.1 38.5 37.9  MCV 92.4 90.5 91.4 92.0  PLT 263 264 229 269   Cardiac Enzymes: No results for input(s): "CKTOTAL", "CKMB", "CKMBINDEX", "TROPONINI" in the last 8760 hours. BNP: Invalid input(s): "POCBNP" Lab Results  Component Value Date   HGBA1C 5.9 (A) 12/04/2019   Lab Results  Component Value Date   TSH 2.428 01/19/2022   No results found for: "VITAMINB12" No results found for: "FOLATE" No results found for: "IRON", "TIBC", "FERRITIN"  Imaging and  Procedures obtained prior to SNF admission: DG Abd 2 Views  Result Date: 01/20/2022 CLINICAL DATA:  Abdominal discomfort. EXAM: ABDOMEN - 2 VIEW COMPARISON:  Abdominal radiograph and CT 06/22/2016 FINDINGS: Gas and stool are present in the colon to the level of the rectum. There is mild gaseous distension of the cecum/ascending colon. No dilated loops of bowel are seen to suggest obstruction. Aortic atherosclerosis and moderate lumbar levoscoliosis are noted. IMPRESSION: No evidence of bowel obstruction. Electronically Signed   By: Sebastian Ache M.D.   On: 01/20/2022 15:04   ECHOCARDIOGRAM COMPLETE  Result Date: 01/20/2022    ECHOCARDIOGRAM REPORT   Patient Name:   [name] Date of Exam: 01/20/2022 Medical Rec #:  [id number]     Height:       65.0  in Accession #:    [id number]    Weight:       149.7 lb Date of Birth:  Dec 27, 1930      BSA:          1.749 m Patient Age:    89 years      BP:           147/62 mmHg Patient Gender: F             HR:           76 bpm. Exam Location:  Inpatient Procedure: 2D Echo Indications:     atrial fibrillation  History:         Patient has prior history of Echocardiogram examinations, most                  recent 06/28/2016. TIA, Arrythmias:Atrial Fibrillation; Risk                  Factors:Hypertension and Dyslipidemia.  Sonographer:     Cathie Hoops Referring Phys:  [id number] HYQMVHQIO RATHORE Diagnosing Phys: Wilfred Lacy  Sonographer Comments: Technically difficult study due to poor echo windows. Image acquisition challenging due to respiratory motion and talking. IMPRESSIONS  1. Left ventricular ejection fraction, by estimation, is 50%. The left ventricle has mildly decreased function. The left ventricle demonstrates global hypokinesis. Left ventricular diastolic parameters are indeterminate.  2. Right ventricular systolic function is normal. The right ventricular size is normal. There is normal pulmonary artery systolic pressure. The estimated right ventricular systolic pressure is 19.8 mmHg.  3. Left atrial size was mildly dilated.  4. The mitral valve is normal in structure. Mild mitral valve regurgitation. No evidence of mitral stenosis. Moderate mitral annular calcification.  5. The aortic valve is tricuspid. There is mild calcification of the aortic valve. Aortic valve regurgitation is trivial. No aortic stenosis is present.  6. Aortic dilatation noted. There is mild dilatation of the aortic root, measuring 39 mm.  7. The inferior vena cava is normal in size with greater than 50% respiratory variability, suggesting right atrial pressure of 3 mmHg.  8. The patient is in atrial fibrillation. FINDINGS  Left Ventricle: Left ventricular ejection fraction, by estimation, is 50%. The left ventricle has mildly decreased  function. The left ventricle demonstrates global hypokinesis. The left ventricular internal cavity size was normal in size. There is no left ventricular hypertrophy. Left ventricular diastolic parameters are indeterminate. Right Ventricle: The right ventricular size is normal. No increase in right ventricular wall thickness. Right ventricular systolic function is normal. There is normal pulmonary artery systolic pressure. The tricuspid regurgitant velocity is 2.05 m/s, and  with an assumed right atrial pressure of 3 mmHg,  the estimated right ventricular systolic pressure is 19.8 mmHg. Left Atrium: Left atrial size was mildly dilated. Right Atrium: Right atrial size was normal in size. Pericardium: Trivial pericardial effusion is present. Mitral Valve: The mitral valve is normal in structure. There is mild calcification of the mitral valve leaflet(s). Moderate mitral annular calcification. Mild mitral valve regurgitation. No evidence of mitral valve stenosis. Tricuspid Valve: The tricuspid valve is normal in structure. Tricuspid valve regurgitation is trivial. Aortic Valve: The aortic valve is tricuspid. There is mild calcification of the aortic valve. Aortic valve regurgitation is trivial. Aortic regurgitation PHT measures 577 msec. No aortic stenosis is present. Aortic valve mean gradient measures 2.3 mmHg. Aortic valve peak gradient measures 3.9 mmHg. Aortic valve area, by VTI measures 3.25 cm. Pulmonic Valve: The pulmonic valve was normal in structure. Pulmonic valve regurgitation is trivial. Aorta: Aortic dilatation noted. There is mild dilatation of the aortic root, measuring 39 mm. Venous: The inferior vena cava is normal in size with greater than 50% respiratory variability, suggesting right atrial pressure of 3 mmHg. IAS/Shunts: No atrial level shunt detected by color flow Doppler.  LEFT VENTRICLE PLAX 2D LVIDd:         4.40 cm     Diastology LVIDs:         3.00 cm     LV e' medial:    7.18 cm/s LV PW:          0.90 cm     LV E/e' medial:  17.3 LV IVS:        0.90 cm     LV e' lateral:   7.94 cm/s LVOT diam:     2.10 cm     LV E/e' lateral: 15.7 LV SV:         56 LV SV Index:   32 LVOT Area:     3.46 cm  LV Volumes (MOD) LV vol d, MOD A2C: 55.8 ml LV vol d, MOD A4C: 50.4 ml LV vol s, MOD A2C: 22.2 ml LV vol s, MOD A4C: 27.8 ml LV SV MOD A2C:     33.6 ml LV SV MOD A4C:     50.4 ml LV SV MOD BP:      28.2 ml RIGHT VENTRICLE RV Basal diam:  2.40 cm RV Mid diam:    2.00 cm RV S prime:     16.10 cm/s TAPSE (M-mode): 1.9 cm LEFT ATRIUM           Index        RIGHT ATRIUM           Index LA diam:      3.60 cm 2.06 cm/m   RA Area:     13.30 cm LA Vol (A2C): 54.0 ml 30.88 ml/m  RA Volume:   23.30 ml  13.32 ml/m LA Vol (A4C): 68.6 ml 39.22 ml/m  AORTIC VALVE                    PULMONIC VALVE AV Area (Vmax):    2.78 cm     PV Vmax:       0.86 m/s AV Area (Vmean):   2.82 cm     PV Peak grad:  2.9 mmHg AV Area (VTI):     3.25 cm AV Vmax:           98.43 cm/s AV Vmean:          67.933 cm/s AV VTI:  0.171 m AV Peak Grad:      3.9 mmHg AV Mean Grad:      2.3 mmHg LVOT Vmax:         79.05 cm/s LVOT Vmean:        55.275 cm/s LVOT VTI:          0.160 m LVOT/AV VTI ratio: 0.94 AI PHT:            577 msec  AORTA Ao Root diam: 3.90 cm Ao Asc diam:  3.60 cm MITRAL VALVE                TRICUSPID VALVE MV Area (PHT): 3.93 cm     TR Peak grad:   16.8 mmHg MV Decel Time: 193 msec     TR Vmax:        205.00 cm/s MR Peak grad: 58.2 mmHg MR Vmax:      381.50 cm/s   SHUNTS MV E velocity: 124.27 cm/s  Systemic VTI:  0.16 m MV A velocity: 37.73 cm/s   Systemic Diam: 2.10 cm MV E/A ratio:  3.29 Dalton McleanMD Electronically signed by Wilfred Lacy Signature Date/Time: 01/20/2022/1:39:40 PM    Final (Updated)    DG Chest Portable 1 View  Result Date: 01/19/2022 CLINICAL DATA:  Atrial fibrillation.  Operative ventricular rate. EXAM: PORTABLE CHEST 1 VIEW COMPARISON:  AP chest 12/27/2019, chest two views 08/16/2016 FINDINGS:  Cardiac silhouette is again mildly enlarged. Mediastinal contours are within normal limits. Moderate calcification within the aortic arch. Increased lucencies within the upper lungs again suggesting chronic emphysematous changes. No pleural effusion or pneumothorax. Moderate multilevel degenerative disc changes of the thoracic spine. IMPRESSION: 1. No active disease. 2. Chronic emphysematous changes of the upper lungs. Electronically Signed   By: Neita Garnet M.D.   On: 01/19/2022 19:38    Assessment/Plan 1. PAF (paroxysmal atrial fibrillation) (HCC) Low dose Lopressor now Also on Eliquis Follow with Dr [place] In Sinus rhythm  2. Essential hypertension, benign BP running low in the hospital Off Amlodipine Low Dose Lopressor only for now  3. Acute cystitis without hematuria Was treated with IV antibiotics  4. Syncope, unspecified syncope type Thought to be due to Possible Vasovagal otr Orthostatic due to IV Cardizem Will continue to monitor here  5. Irritable bowel syndrome with diarrhea Had few episodes here Will monitor for now  6. Hyperlipidemia with target LDL less than 130 On statin  7. Recurrent UTI On Macrobid    Family/ staff Communication:   Labs/tests ordered:

## 2022-01-26 NOTE — Progress Notes (Signed)
Mobility Specialist Progress Note    01/26/22 1018  Mobility  Activity Ambulated with assistance in hallway  Level of Assistance Contact guard assist, steadying assist  Assistive Device Front wheel walker  Distance Ambulated (ft) 190 ft  Activity Response Tolerated well  Mobility Referral Yes  $Mobility charge 1 Mobility   Pt received in bed and agreeable. No complaints on walk. Returned to bed with call bell in reach.    Hildred Alamin Mobility Specialist

## 2022-01-26 NOTE — Progress Notes (Unsigned)
Location:  Occupational psychologist of Service:  SNF (31) Provider:  [name], MD   [name], MD  Patient Care Team: [name], MD as PCP - General (Internal Medicine) [name], [name], MD as PCP - Cardiology (Cardiology) [name], [name], MD as Consulting Physician (Cardiology) [name], NP as Nurse Practitioner [name], MD as Consulting Physician (Urology) [name], [place] as Physician Assistant (Cardiology) [name], MD as Consulting Physician (Family Medicine) [name], MD as Consulting Physician (Urology)  Extended Emergency Contact Information Primary Emergency Contact: [place], Fonda Johnnette Litter of [place] Phone: [phone] Mobile Phone: [phone] Relation: Son Secondary Emergency Contact: [facility] Address: [address]          [place], [place] [address] Johnnette Litter of [place] Phone: [phone] Relation: Daughter  Code Status:  DNR Goals of care: Advanced Directive information    01/26/2022    3:23 PM  Advanced Directives  Does Patient Have a Medical Advance Directive? Yes  Type of Advance Directive Out of facility DNR (pink MOST or yellow form)  Does patient want to make changes to medical advance directive? No - Patient declined     Chief Complaint  Patient presents with   New Admit To SNF    New Admit to SNF appointment     HPI:  Pt is a 88 y.o. female seen today for medical management of chronic diseases.     Patient admitted in hospital from 10/03-10/10 for A Fib with RVR   Patient has a history of PAF on Eliquis, recurrent UTI and is on Macrobid, HLD, hypertension, IBS with diarrhea, anxiety.  Patient states that she was in her apartment and suddenly she was unable to get up.  She denies any dizziness palpitations chest pain.  [facility] was called and she was transferred to ED in ED she was found to have A-fib  with RVR Her UA was positive for nitrites and leukocyte.  She was treated with IV cefepime. Culture was not done  For her A-fib patient was treated with IV Cardizem.  But then patient had a syncopal episode.  Her Cardizem was discontinued and she was started on low-dose of beta-blocker.  She converted to sinus rhythm. Echo showed normal EF with global hypokinesis Elevated troponins were thought to be due to demand ischemia. Her amlodipine was discontinued due to Low BP  Patient is in rehab for therapy.  She is doing well denies any dizziness planning to use walker now.  She is walking with no assist. Had no acute issues today    Past Medical History:  Diagnosis Date   Allergic rhinitis    Arthritis    "hands, little in my feet" (06/22/2016)   Candidiasis of vulva and vagina    Cystocele, midline    Disorder of bone and cartilage, unspecified    Fibroids    Hypertension    Large hiatal hernia 06/23/2016   Migraine    "none in the last few years" (06/22/2016)   Osteopenia    Other chronic cystitis    Other sign and symptom in breast    Paroxysmal atrial fibrillation ([facility]) 11/30/2012   Pneumonia    "I've had walking pneumonia a couple times"  (06/22/2016)   Pneumonia dx'd 06/15/2016   Rectal incontinence    Stroke ([facility]) 2014   hx of mini stroke  TIA (transient ischemic attack) 2015   Past Surgical History:  Procedure Laterality Date   CARDIOVERSION N/A 06/29/2016   Procedure: CARDIOVERSION;  Surgeon: Jerline Pain, MD;  Location: [facility];  Service: Cardiovascular;  Laterality: N/A;   CATARACT EXTRACTION W/ INTRAOCULAR LENS IMPLANT Left    DILATION AND CURETTAGE OF UTERUS     hiatel hernia  08/20/2016   HYSTEROSCOPY WITH D & C  03/16/1999   INSERTION OF MESH N/A 08/20/2016   Procedure: INSERTION OF MESH;  Surgeon: Ralene Ok, MD;  Location: [facility];  Service: General;  Laterality: N/A;   TEE WITHOUT CARDIOVERSION N/A 11/06/2012   Procedure: TRANSESOPHAGEAL ECHOCARDIOGRAM  (TEE);  Surgeon: Lelon Perla, MD;  Location: [facility] ENDOSCOPY;  Service: Cardiovascular;  Laterality: N/A;   TONSILLECTOMY  1937   VAGINAL HYSTERECTOMY  09/2005   Anterior repair; Removal of urethral caruncle./notes 09/02/2015    Allergies  Allergen Reactions   Lactose Other (See Comments)    Dairy Sensitivity  Dairy Sensitivity     Lisinopril Other (See Comments)    DIZZY   Bactrim [Sulfamethoxazole-Trimethoprim] Rash    Outpatient Encounter Medications as of 01/26/2022  Medication Sig   acetaminophen (TYLENOL) 500 MG tablet Take 500-1,000 mg by mouth every 6 (six) hours as needed (for headache/pain.).   ALPRAZolam (XANAX) 0.25 MG tablet Take 1 tablet (0.25 mg total) by mouth 2 (two) times daily as needed for anxiety.   CALCIUM CARBONATE-VIT D-MIN PO Take 2 tablets by mouth 3 (three) times a week.    CRANBERRY PO Take 1 tablet by mouth daily.   ELIQUIS 5 MG TABS tablet TAKE 1 TABLET BY MOUTH TWICE DAILY (Patient taking differently: Take 5 mg by mouth 2 (two) times daily.)   estradiol (ESTRACE) 0.1 MG/GM vaginal cream Place 1 Applicatorful vaginally 3 (three) times a week.   Eyelid Cleansers (OCUSOFT EYELID CLEANSING EX) Apply 1 application  topically as needed (dry eyes).   Glucosamine-Chondroitin (COSAMIN DS PO) Take 1 tablet by mouth 2 (two) times daily.   loperamide (IMODIUM A-D) 2 MG tablet Take 2 mg by mouth as needed for diarrhea or loose stools.   loratadine (CLARITIN) 10 MG tablet Take 10 mg by mouth daily as needed for allergies.    metoprolol tartrate (LOPRESSOR) 25 MG tablet Take 0.5 tablets (12.5 mg total) by mouth 2 (two) times daily.   nitrofurantoin, macrocrystal-monohydrate, (MACROBID) 100 MG capsule Take 100 mg by mouth at bedtime.   ondansetron (ZOFRAN) 4 MG tablet Take 1 tablet (4 mg total) by mouth every 8 (eight) hours as needed for nausea or vomiting.   Polyethyl Glycol-Propyl Glycol (SYSTANE OP) Place 1 drop into both eyes 2 (two) times daily as needed (for dry  eyes).    pravastatin (PRAVACHOL) 40 MG tablet TAKE 1 TABLET(40 MG) BY MOUTH DAILY (Patient taking differently: Take 40 mg by mouth daily.)   Probiotic Product (PROBIOTIC DAILY) CAPS Take 2 capsules by mouth daily.   saccharomyces boulardii (FLORASTOR) 250 MG capsule Take 1 capsule (250 mg total) by mouth 2 (two) times daily for 7 days.   [DISCONTINUED] influenza vaccine adjuvanted (FLUAD QUADRIVALENT) 0.5 ML injection Inject into the muscle.   No facility-administered encounter medications on file as of 01/26/2022.    Review of Systems  Constitutional:  Negative for activity change and appetite change.  HENT: Negative.    Respiratory:  Negative for cough and shortness of breath.   Cardiovascular:  Positive for leg swelling.  Gastrointestinal:  Positive for constipation.  Genitourinary:  Negative.   Musculoskeletal:  Positive for gait problem. Negative for arthralgias and myalgias.  Skin: Negative.   Neurological:  Negative for dizziness and weakness.  Psychiatric/Behavioral:  Negative for confusion, dysphoric mood and sleep disturbance.     Immunization History  Administered Date(s) Administered   Fluad Quad(high Dose 65+) 01/09/2019, 01/19/2022   Influenza Split 12/18/2013   Influenza, High Dose Seasonal PF 01/24/2018   Influenza-Unspecified 01/12/2015, 01/13/2016, 01/20/2017, 01/24/2018, 01/06/2021   Moderna Covid-19 Vaccine Bivalent Booster 21yr & up 01/30/2021   Moderna Sars-Covid-2 Vaccination 05/01/2019, 05/29/2019, 03/04/2020   Pneumococcal Conjugate-13 10/02/2013   Pneumococcal Polysaccharide-23 12/09/2014   Td 05/16/1995, 12/10/2004   Tdap 04/19/2004, 02/20/2016   Zoster Recombinat (Shingrix) 10/29/2016, 03/21/2017   Zoster, Live 02/11/2006   Pertinent  Health Maintenance Due  Topic Date Due   INFLUENZA VACCINE  Completed   DEXA SCAN  Completed   MAMMOGRAM  Discontinued      01/23/2022    7:45 AM 01/23/2022    7:45 PM 01/25/2022    2:09 AM 01/25/2022    7:25 AM  01/25/2022    7:50 PM  Fall Risk  Patient Fall Risk Level High fall risk High fall risk High fall risk High fall risk High fall risk   Functional Status Survey:    Vitals:   01/26/22 1512  BP: (!) 160/79  Pulse: 63  Resp: 18  Temp: (!) 97.5 F (36.4 C)  SpO2: 95%  Weight: 145 lb 1.6 oz (65.8 kg)  Height: '5\' 6"'$  (1.676 m)   Body mass index is 23.42 kg/m. Physical Exam Vitals reviewed.  Constitutional:      Appearance: Normal appearance.  HENT:     Head: Normocephalic.     Nose: Nose normal.     Mouth/Throat:     Mouth: Mucous membranes are moist.     Pharynx: Oropharynx is clear.  Eyes:     Pupils: Pupils are equal, round, and reactive to light.  Cardiovascular:     Rate and Rhythm: Normal rate and regular rhythm.     Pulses: Normal pulses.     Heart sounds: Normal heart sounds. No murmur heard. Pulmonary:     Effort: Pulmonary effort is normal.     Breath sounds: Normal breath sounds.  Abdominal:     General: Abdomen is flat. Bowel sounds are normal.     Palpations: Abdomen is soft.  Musculoskeletal:        General: Swelling present.     Cervical back: Neck supple.  Skin:    General: Skin is warm.  Neurological:     General: No focal deficit present.     Mental Status: She is alert and oriented to person, place, and time.  Psychiatric:        Mood and Affect: Mood normal.        Thought Content: Thought content normal.     Labs reviewed: Recent Labs    01/24/22 1146 01/25/22 0020 01/26/22 0105  NA 141 141 139  K 3.8 3.9 3.8  CL 107 107 106  CO2 '26 23 24  '$ GLUCOSE 102* 97 107*  BUN '20 21 13  '$ CREATININE 0.74 0.81 0.65  CALCIUM 9.1 8.9 8.7*  MG 2.0 1.9 2.0   Recent Labs    01/19/22 1946  AST 30  ALT 18  ALKPHOS 61  BILITOT 0.6  PROT 8.5*  ALBUMIN 4.6   Recent Labs    01/19/22 1946 01/21/22 0018 01/22/22 0545 01/23/22 0038  WBC 8.9 13.1* 8.6  8.7  NEUTROABS 6.4  --   --   --   HGB 15.9* 13.7 12.7 12.3  HCT 47.7* 41.1 38.5 37.9  MCV  92.4 90.5 91.4 92.0  PLT 263 264 229 269   Lab Results  Component Value Date   TSH 2.428 01/19/2022   Lab Results  Component Value Date   HGBA1C 5.9 (A) 12/04/2019   Lab Results  Component Value Date   CHOL 155 04/24/2018   HDL 50 04/24/2018   LDLCALC 71 04/24/2018   TRIG 171 (H) 04/24/2018   CHOLHDL 3.1 04/24/2018    Significant Diagnostic Results in last 30 days:  DG Abd 2 Views  Result Date: 01/20/2022 CLINICAL DATA:  Abdominal discomfort. EXAM: ABDOMEN - 2 VIEW COMPARISON:  Abdominal radiograph and CT 06/22/2016 FINDINGS: Gas and stool are present in the colon to the level of the rectum. There is mild gaseous distension of the cecum/ascending colon. No dilated loops of bowel are seen to suggest obstruction. Aortic atherosclerosis and moderate lumbar levoscoliosis are noted. IMPRESSION: No evidence of bowel obstruction. Electronically Signed   By: Logan Bores M.D.   On: 01/20/2022 15:04   ECHOCARDIOGRAM COMPLETE  Result Date: 01/20/2022    ECHOCARDIOGRAM REPORT   Patient Name:   [name] Date of Exam: 01/20/2022 Medical Rec #:  [id number]     Height:       65.0 in Accession #:    [id number]    Weight:       149.7 lb Date of Birth:  10-11-1930      BSA:          1.749 m Patient Age:    11 years      BP:           147/62 mmHg Patient Gender: F             HR:           76 bpm. Exam Location:  Inpatient Procedure: 2D Echo Indications:     atrial fibrillation  History:         Patient has prior history of Echocardiogram examinations, most                  recent 06/28/2016. TIA, Arrythmias:Atrial Fibrillation; Risk                  Factors:Hypertension and Dyslipidemia.  Sonographer:     Harvie Junior Referring Phys:  [id number] [place] Diagnosing Phys: Franki Monte  Sonographer Comments: Technically difficult study due to poor echo windows. Image acquisition challenging due to respiratory motion and talking. IMPRESSIONS  1. Left ventricular ejection fraction, by estimation, is  50%. The left ventricle has mildly decreased function. The left ventricle demonstrates global hypokinesis. Left ventricular diastolic parameters are indeterminate.  2. Right ventricular systolic function is normal. The right ventricular size is normal. There is normal pulmonary artery systolic pressure. The estimated right ventricular systolic pressure is 25.8 mmHg.  3. Left atrial size was mildly dilated.  4. The mitral valve is normal in structure. Mild mitral valve regurgitation. No evidence of mitral stenosis. Moderate mitral annular calcification.  5. The aortic valve is tricuspid. There is mild calcification of the aortic valve. Aortic valve regurgitation is trivial. No aortic stenosis is present.  6. Aortic dilatation noted. There is mild dilatation of the aortic root, measuring 39 mm.  7. The inferior vena cava is normal in size with greater than 50% respiratory variability, suggesting right atrial pressure of  3 mmHg.  8. The patient is in atrial fibrillation. FINDINGS  Left Ventricle: Left ventricular ejection fraction, by estimation, is 50%. The left ventricle has mildly decreased function. The left ventricle demonstrates global hypokinesis. The left ventricular internal cavity size was normal in size. There is no left ventricular hypertrophy. Left ventricular diastolic parameters are indeterminate. Right Ventricle: The right ventricular size is normal. No increase in right ventricular wall thickness. Right ventricular systolic function is normal. There is normal pulmonary artery systolic pressure. The tricuspid regurgitant velocity is 2.05 m/s, and  with an assumed right atrial pressure of 3 mmHg, the estimated right ventricular systolic pressure is 14.9 mmHg. Left Atrium: Left atrial size was mildly dilated. Right Atrium: Right atrial size was normal in size. Pericardium: Trivial pericardial effusion is present. Mitral Valve: The mitral valve is normal in structure. There is mild calcification of the  mitral valve leaflet(s). Moderate mitral annular calcification. Mild mitral valve regurgitation. No evidence of mitral valve stenosis. Tricuspid Valve: The tricuspid valve is normal in structure. Tricuspid valve regurgitation is trivial. Aortic Valve: The aortic valve is tricuspid. There is mild calcification of the aortic valve. Aortic valve regurgitation is trivial. Aortic regurgitation PHT measures 577 msec. No aortic stenosis is present. Aortic valve mean gradient measures 2.3 mmHg. Aortic valve peak gradient measures 3.9 mmHg. Aortic valve area, by VTI measures 3.25 cm. Pulmonic Valve: The pulmonic valve was normal in structure. Pulmonic valve regurgitation is trivial. Aorta: Aortic dilatation noted. There is mild dilatation of the aortic root, measuring 39 mm. Venous: The inferior vena cava is normal in size with greater than 50% respiratory variability, suggesting right atrial pressure of 3 mmHg. IAS/Shunts: No atrial level shunt detected by color flow Doppler.  LEFT VENTRICLE PLAX 2D LVIDd:         4.40 cm     Diastology LVIDs:         3.00 cm     LV e' medial:    7.18 cm/s LV PW:         0.90 cm     LV E/e' medial:  17.3 LV IVS:        0.90 cm     LV e' lateral:   7.94 cm/s LVOT diam:     2.10 cm     LV E/e' lateral: 15.7 LV SV:         56 LV SV Index:   32 LVOT Area:     3.46 cm  LV Volumes (MOD) LV vol d, MOD A2C: 55.8 ml LV vol d, MOD A4C: 50.4 ml LV vol s, MOD A2C: 22.2 ml LV vol s, MOD A4C: 27.8 ml LV SV MOD A2C:     33.6 ml LV SV MOD A4C:     50.4 ml LV SV MOD BP:      28.2 ml RIGHT VENTRICLE RV Basal diam:  2.40 cm RV Mid diam:    2.00 cm RV S prime:     16.10 cm/s TAPSE (M-mode): 1.9 cm LEFT ATRIUM           Index        RIGHT ATRIUM           Index LA diam:      3.60 cm 2.06 cm/m   RA Area:     13.30 cm LA Vol (A2C): 54.0 ml 30.88 ml/m  RA Volume:   23.30 ml  13.32 ml/m LA Vol (A4C): 68.6 ml 39.22 ml/m  AORTIC VALVE  PULMONIC VALVE AV Area (Vmax):    2.78 cm     PV Vmax:        0.86 m/s AV Area (Vmean):   2.82 cm     PV Peak grad:  2.9 mmHg AV Area (VTI):     3.25 cm AV Vmax:           98.43 cm/s AV Vmean:          67.933 cm/s AV VTI:            0.171 m AV Peak Grad:      3.9 mmHg AV Mean Grad:      2.3 mmHg LVOT Vmax:         79.05 cm/s LVOT Vmean:        55.275 cm/s LVOT VTI:          0.160 m LVOT/AV VTI ratio: 0.94 AI PHT:            577 msec  AORTA Ao Root diam: 3.90 cm Ao Asc diam:  3.60 cm MITRAL VALVE                TRICUSPID VALVE MV Area (PHT): 3.93 cm     TR Peak grad:   16.8 mmHg MV Decel Time: 193 msec     TR Vmax:        205.00 cm/s MR Peak grad: 58.2 mmHg MR Vmax:      381.50 cm/s   SHUNTS MV E velocity: 124.27 cm/s  Systemic VTI:  0.16 m MV A velocity: 37.73 cm/s   Systemic Diam: 2.10 cm MV E/A ratio:  3.29 Dalton McleanMD Electronically signed by Franki Monte Signature Date/Time: 01/20/2022/1:39:40 PM    Final (Updated)    DG Chest Portable 1 View  Result Date: 01/19/2022 CLINICAL DATA:  Atrial fibrillation.  Operative ventricular rate. EXAM: PORTABLE CHEST 1 VIEW COMPARISON:  AP chest 12/27/2019, chest two views 08/16/2016 FINDINGS: Cardiac silhouette is again mildly enlarged. Mediastinal contours are within normal limits. Moderate calcification within the aortic arch. Increased lucencies within the upper lungs again suggesting chronic emphysematous changes. No pleural effusion or pneumothorax. Moderate multilevel degenerative disc changes of the thoracic spine. IMPRESSION: 1. No active disease. 2. Chronic emphysematous changes of the upper lungs. Electronically Signed   By: Yvonne Kendall M.D.   On: 01/19/2022 19:38    Assessment/Plan 1. PAF (paroxysmal atrial fibrillation) (HCC) Low dose Lopressor now Also on Eliquis Follow with Dr [name] In Sinus rhythm   2. Essential hypertension, benign BP running low in the hospital Off Amlodipine Low Dose Lopressor only for now It was high today will follow and see if need to increase the Lopressor    3.  Acute cystitis without hematuria Was treated with IV antibiotics She has no symptoms   4. Syncope, unspecified syncope type Thought to be due to Possible Vasovagal or Orthostatic due to IV Cardizem Will continue to monitor here   5. Irritable bowel syndrome with diarrhea Had few episodes here Will monitor for now   6. Hyperlipidemia with target LDL less than 130 On statin   7. Recurrent UTI On Macrobid     Family/ staff Communication:   Labs/tests ordered:

## 2022-01-27 ENCOUNTER — Non-Acute Institutional Stay (SKILLED_NURSING_FACILITY): Payer: Medicare Other | Admitting: Orthopedic Surgery

## 2022-01-27 ENCOUNTER — Encounter: Payer: Self-pay | Admitting: Orthopedic Surgery

## 2022-01-27 DIAGNOSIS — N390 Urinary tract infection, site not specified: Secondary | ICD-10-CM

## 2022-01-27 DIAGNOSIS — R9431 Abnormal electrocardiogram [ECG] [EKG]: Secondary | ICD-10-CM

## 2022-01-27 DIAGNOSIS — I480 Paroxysmal atrial fibrillation: Secondary | ICD-10-CM | POA: Diagnosis not present

## 2022-01-27 MED ORDER — METOPROLOL TARTRATE 25 MG PO TABS: 25.0000 mg | ORAL_TABLET | Freq: Two times a day (BID) | ORAL | 0 refills | Status: DC

## 2022-01-27 NOTE — Progress Notes (Signed)
Location:  [facility] Room Number: 157/A Place of Service:  SNF [phone]) Provider:  Yvonna Alanis, NP   [name], MD  Patient Care Team: [name], MD as PCP - General (Internal Medicine) [name], [name], MD as PCP - Cardiology (Cardiology) [name], [name], MD as Consulting Physician (Cardiology) [name], NP as Nurse Practitioner [name], MD as Consulting Physician (Urology) [name], [place] as Physician Assistant (Cardiology) [name], MD as Consulting Physician (Family Medicine) [name], MD as Consulting Physician (Urology)  Extended Emergency Contact Information Primary Emergency Contact: [place], [place] Johnnette Litter of [place] Phone: [phone] Mobile Phone: [phone] Relation: Son Secondary Emergency Contact: [facility] Address: [address]          [place], [place] [address] Johnnette Litter of [place] Phone: [phone] Relation: Daughter  Code Status:  DNR Goals of care: Advanced Directive information    01/26/2022    3:23 PM  Advanced Directives  Does Patient Have a Medical Advance Directive? Yes  Type of Advance Directive Out of facility DNR (pink MOST or yellow form)  Does patient want to make changes to medical advance directive? No - Patient declined     Chief Complaint  Patient presents with   Acute Visit    tachycardia    HPI:  Pt is a 88 y.o. female seen today for acute visit due to tachycardia.   She currently resides on the rehab unit at [facility] PMH: HTN, HLD, PAF, recurrent UTI, IBS, and anxiety.   Recently hospitalized 10/03- 10/10 due to afib with RVR, UTI and dehydration. She was started on a Cardizem drip, but stopped due to orthostatic hypotension. She was started on lopressor. Cardioversion was scheduled 10/09, but cancelled. She ended up converting on her own, NSR at discharge. Remains on Eliquis.   Last  night her HR was 140. EKG this morning reveals A-flutter, HR 70. She reports mild fatigue. Denies palpitations or dizziness.     Past Medical History:  Diagnosis Date   Allergic rhinitis    Arthritis    "hands, little in my feet" (06/22/2016)   Candidiasis of vulva and vagina    Cystocele, midline    Disorder of bone and cartilage, unspecified    Fibroids    Hypertension    Large hiatal hernia 06/23/2016   Migraine    "none in the last few years" (06/22/2016)   Osteopenia    Other chronic cystitis    Other sign and symptom in breast    Paroxysmal atrial fibrillation ([place]) 11/30/2012   Pneumonia    "I've had walking pneumonia a couple times"  (06/22/2016)   Pneumonia dx'd 06/15/2016   Rectal incontinence    Stroke ([place]) 2014   hx of mini stroke    TIA (transient ischemic attack) 2015   Past Surgical History:  Procedure Laterality Date   CARDIOVERSION N/A 06/29/2016   Procedure: CARDIOVERSION;  Surgeon: Jerline Pain, MD;  Location: [facility];  Service: Cardiovascular;  Laterality: N/A;   CATARACT EXTRACTION W/ INTRAOCULAR LENS IMPLANT Left    DILATION AND CURETTAGE OF UTERUS     hiatel hernia  08/20/2016   HYSTEROSCOPY WITH D & C  03/16/1999   INSERTION OF MESH N/A 08/20/2016   Procedure: INSERTION OF MESH;  Surgeon: Ralene Ok, MD;  Location: [facility];  Service: General;  Laterality: N/A;  TEE WITHOUT CARDIOVERSION N/A 11/06/2012   Procedure: TRANSESOPHAGEAL ECHOCARDIOGRAM (TEE);  Surgeon: Lelon Perla, MD;  Location: [facility] ENDOSCOPY;  Service: Cardiovascular;  Laterality: N/A;   TONSILLECTOMY  1937   VAGINAL HYSTERECTOMY  09/2005   Anterior repair; Removal of urethral caruncle./notes 09/02/2015    Allergies  Allergen Reactions   Lactose Other (See Comments)    Dairy Sensitivity  Dairy Sensitivity     Lisinopril Other (See Comments)    DIZZY   Bactrim [Sulfamethoxazole-Trimethoprim] Rash    Outpatient Encounter Medications as of 01/27/2022  Medication Sig    acetaminophen (TYLENOL) 500 MG tablet Take 500-1,000 mg by mouth every 6 (six) hours as needed (for headache/pain.).   ALPRAZolam (XANAX) 0.25 MG tablet Take 1 tablet (0.25 mg total) by mouth 2 (two) times daily as needed for anxiety.   CALCIUM CARBONATE-VIT D-MIN PO Take 2 tablets by mouth 3 (three) times a week.    CRANBERRY PO Take 1 tablet by mouth daily.   ELIQUIS 5 MG TABS tablet TAKE 1 TABLET BY MOUTH TWICE DAILY (Patient taking differently: Take 5 mg by mouth 2 (two) times daily.)   estradiol (ESTRACE) 0.1 MG/GM vaginal cream Place 1 Applicatorful vaginally 3 (three) times a week.   Eyelid Cleansers (OCUSOFT EYELID CLEANSING EX) Apply 1 application  topically as needed (dry eyes).   Glucosamine-Chondroitin (COSAMIN DS PO) Take 1 tablet by mouth 2 (two) times daily.   loperamide (IMODIUM A-D) 2 MG tablet Take 2 mg by mouth as needed for diarrhea or loose stools.   loratadine (CLARITIN) 10 MG tablet Take 10 mg by mouth daily as needed for allergies.    metoprolol tartrate (LOPRESSOR) 25 MG tablet Take 0.5 tablets (12.5 mg total) by mouth 2 (two) times daily.   nitrofurantoin, macrocrystal-monohydrate, (MACROBID) 100 MG capsule Take 100 mg by mouth at bedtime.   ondansetron (ZOFRAN) 4 MG tablet Take 1 tablet (4 mg total) by mouth every 8 (eight) hours as needed for nausea or vomiting.   Polyethyl Glycol-Propyl Glycol (SYSTANE OP) Place 1 drop into both eyes 2 (two) times daily as needed (for dry eyes).    pravastatin (PRAVACHOL) 40 MG tablet TAKE 1 TABLET(40 MG) BY MOUTH DAILY (Patient taking differently: Take 40 mg by mouth daily.)   Probiotic Product (PROBIOTIC DAILY) CAPS Take 2 capsules by mouth daily.   saccharomyces boulardii (FLORASTOR) 250 MG capsule Take 1 capsule (250 mg total) by mouth 2 (two) times daily for 7 days.   No facility-administered encounter medications on file as of 01/27/2022.    Review of Systems  Constitutional:  Positive for fatigue. Negative for activity  change, appetite change, chills and fever.  HENT:  Negative for congestion and trouble swallowing.   Eyes:  Negative for visual disturbance.  Respiratory:  Negative for cough, shortness of breath and wheezing.   Cardiovascular:  Negative for chest pain, palpitations and leg swelling.  Gastrointestinal:  Negative for abdominal distention, abdominal pain, constipation, diarrhea and vomiting.  Genitourinary:  Positive for dysuria. Negative for frequency.  Musculoskeletal:  Positive for gait problem.  Skin:  Negative for wound.  Neurological:  Positive for weakness. Negative for dizziness and headaches.  Psychiatric/Behavioral:  Negative for confusion and dysphoric mood. The patient is not nervous/anxious.     Immunization History  Administered Date(s) Administered   Fluad Quad(high Dose 65+) 01/09/2019, 01/19/2022   Influenza Split 12/18/2013   Influenza, High Dose Seasonal PF 01/24/2018   Influenza-Unspecified 01/12/2015, 01/13/2016, 01/20/2017, 01/24/2018, 01/06/2021   Moderna Covid-19 Vaccine  Bivalent Booster 58yr & up 01/30/2021   Moderna Sars-Covid-2 Vaccination 05/01/2019, 05/29/2019, 03/04/2020   Pneumococcal Conjugate-13 10/02/2013   Pneumococcal Polysaccharide-23 12/09/2014   Td 05/16/1995, 12/10/2004   Tdap 04/19/2004, 02/20/2016   Zoster Recombinat (Shingrix) 10/29/2016, 03/21/2017   Zoster, Live 02/11/2006   Pertinent  Health Maintenance Due  Topic Date Due   INFLUENZA VACCINE  Completed   DEXA SCAN  Completed   MAMMOGRAM  Discontinued      01/23/2022    7:45 AM 01/23/2022    7:45 PM 01/25/2022    2:09 AM 01/25/2022    7:25 AM 01/25/2022    7:50 PM  Fall Risk  Patient Fall Risk Level High fall risk High fall risk High fall risk High fall risk High fall risk   Functional Status Survey:    Vitals:   01/27/22 1539  BP: 114/65  Pulse: 70  Resp: 20  Temp: 99 F (37.2 C)  SpO2: 92%  Weight: 145 lb 1.6 oz (65.8 kg)  Height: '5\' 6"'$  (1.676 m)   Body mass index is  23.42 kg/m. Physical Exam  Labs reviewed: Recent Labs    01/24/22 1146 01/25/22 0020 01/26/22 0105  NA 141 141 139  K 3.8 3.9 3.8  CL 107 107 106  CO2 '26 23 24  '$ GLUCOSE 102* 97 107*  BUN '20 21 13  '$ CREATININE 0.74 0.81 0.65  CALCIUM 9.1 8.9 8.7*  MG 2.0 1.9 2.0   Recent Labs    01/19/22 1946  AST 30  ALT 18  ALKPHOS 61  BILITOT 0.6  PROT 8.5*  ALBUMIN 4.6   Recent Labs    01/19/22 1946 01/21/22 0018 01/22/22 0545 01/23/22 0038  WBC 8.9 13.1* 8.6 8.7  NEUTROABS 6.4  --   --   --   HGB 15.9* 13.7 12.7 12.3  HCT 47.7* 41.1 38.5 37.9  MCV 92.4 90.5 91.4 92.0  PLT 263 264 229 269   Lab Results  Component Value Date   TSH 2.428 01/19/2022   Lab Results  Component Value Date   HGBA1C 5.9 (A) 12/04/2019   Lab Results  Component Value Date   CHOL 155 04/24/2018   HDL 50 04/24/2018   LDLCALC 71 04/24/2018   TRIG 171 (H) 04/24/2018   CHOLHDL 3.1 04/24/2018    Significant Diagnostic Results in last 30 days:  DG Abd 2 Views  Result Date: 01/20/2022 CLINICAL DATA:  Abdominal discomfort. EXAM: ABDOMEN - 2 VIEW COMPARISON:  Abdominal radiograph and CT 06/22/2016 FINDINGS: Gas and stool are present in the colon to the level of the rectum. There is mild gaseous distension of the cecum/ascending colon. No dilated loops of bowel are seen to suggest obstruction. Aortic atherosclerosis and moderate lumbar levoscoliosis are noted. IMPRESSION: No evidence of bowel obstruction. Electronically Signed   By: ALogan BoresM.D.   On: 01/20/2022 15:04   ECHOCARDIOGRAM COMPLETE  Result Date: 01/20/2022    ECHOCARDIOGRAM REPORT   Patient Name:   Katrina OSTERHOUTDate of Exam: 01/20/2022 Medical Rec #:  [id number]    Height:       65.0 in Accession #:    [id number]   Weight:       149.7 lb Date of Birth:  208-16-1932     BSA:          1.749 m Patient Age:    89years      BP:           147/62 mmHg Patient  Gender: F             HR:           76 bpm. Exam Location:  Inpatient Procedure: 2D  Echo Indications:     atrial fibrillation  History:         Patient has prior history of Echocardiogram examinations, most                  recent 06/28/2016. TIA, Arrythmias:Atrial Fibrillation; Risk                  Factors:Hypertension and Dyslipidemia.  Sonographer:     Harvie Junior Referring Phys:  [id number] [place] Diagnosing Phys: Franki Monte  Sonographer Comments: Technically difficult study due to poor echo windows. Image acquisition challenging due to respiratory motion and talking. IMPRESSIONS  1. Left ventricular ejection fraction, by estimation, is 50%. The left ventricle has mildly decreased function. The left ventricle demonstrates global hypokinesis. Left ventricular diastolic parameters are indeterminate.  2. Right ventricular systolic function is normal. The right ventricular size is normal. There is normal pulmonary artery systolic pressure. The estimated right ventricular systolic pressure is 09.8 mmHg.  3. Left atrial size was mildly dilated.  4. The mitral valve is normal in structure. Mild mitral valve regurgitation. No evidence of mitral stenosis. Moderate mitral annular calcification.  5. The aortic valve is tricuspid. There is mild calcification of the aortic valve. Aortic valve regurgitation is trivial. No aortic stenosis is present.  6. Aortic dilatation noted. There is mild dilatation of the aortic root, measuring 39 mm.  7. The inferior vena cava is normal in size with greater than 50% respiratory variability, suggesting right atrial pressure of 3 mmHg.  8. The patient is in atrial fibrillation. FINDINGS  Left Ventricle: Left ventricular ejection fraction, by estimation, is 50%. The left ventricle has mildly decreased function. The left ventricle demonstrates global hypokinesis. The left ventricular internal cavity size was normal in size. There is no left ventricular hypertrophy. Left ventricular diastolic parameters are indeterminate. Right Ventricle: The right ventricular  size is normal. No increase in right ventricular wall thickness. Right ventricular systolic function is normal. There is normal pulmonary artery systolic pressure. The tricuspid regurgitant velocity is 2.05 m/s, and  with an assumed right atrial pressure of 3 mmHg, the estimated right ventricular systolic pressure is 11.9 mmHg. Left Atrium: Left atrial size was mildly dilated. Right Atrium: Right atrial size was normal in size. Pericardium: Trivial pericardial effusion is present. Mitral Valve: The mitral valve is normal in structure. There is mild calcification of the mitral valve leaflet(s). Moderate mitral annular calcification. Mild mitral valve regurgitation. No evidence of mitral valve stenosis. Tricuspid Valve: The tricuspid valve is normal in structure. Tricuspid valve regurgitation is trivial. Aortic Valve: The aortic valve is tricuspid. There is mild calcification of the aortic valve. Aortic valve regurgitation is trivial. Aortic regurgitation PHT measures 577 msec. No aortic stenosis is present. Aortic valve mean gradient measures 2.3 mmHg. Aortic valve peak gradient measures 3.9 mmHg. Aortic valve area, by VTI measures 3.25 cm. Pulmonic Valve: The pulmonic valve was normal in structure. Pulmonic valve regurgitation is trivial. Aorta: Aortic dilatation noted. There is mild dilatation of the aortic root, measuring 39 mm. Venous: The inferior vena cava is normal in size with greater than 50% respiratory variability, suggesting right atrial pressure of 3 mmHg. IAS/Shunts: No atrial level shunt detected by color flow Doppler.  LEFT VENTRICLE PLAX 2D LVIDd:  4.40 cm     Diastology LVIDs:         3.00 cm     LV e' medial:    7.18 cm/s LV PW:         0.90 cm     LV E/e' medial:  17.3 LV IVS:        0.90 cm     LV e' lateral:   7.94 cm/s LVOT diam:     2.10 cm     LV E/e' lateral: 15.7 LV SV:         56 LV SV Index:   32 LVOT Area:     3.46 cm  LV Volumes (MOD) LV vol d, MOD A2C: 55.8 ml LV vol d, MOD  A4C: 50.4 ml LV vol s, MOD A2C: 22.2 ml LV vol s, MOD A4C: 27.8 ml LV SV MOD A2C:     33.6 ml LV SV MOD A4C:     50.4 ml LV SV MOD BP:      28.2 ml RIGHT VENTRICLE RV Basal diam:  2.40 cm RV Mid diam:    2.00 cm RV S prime:     16.10 cm/s TAPSE (M-mode): 1.9 cm LEFT ATRIUM           Index        RIGHT ATRIUM           Index LA diam:      3.60 cm 2.06 cm/m   RA Area:     13.30 cm LA Vol (A2C): 54.0 ml 30.88 ml/m  RA Volume:   23.30 ml  13.32 ml/m LA Vol (A4C): 68.6 ml 39.22 ml/m  AORTIC VALVE                    PULMONIC VALVE AV Area (Vmax):    2.78 cm     PV Vmax:       0.86 m/s AV Area (Vmean):   2.82 cm     PV Peak grad:  2.9 mmHg AV Area (VTI):     3.25 cm AV Vmax:           98.43 cm/s AV Vmean:          67.933 cm/s AV VTI:            0.171 m AV Peak Grad:      3.9 mmHg AV Mean Grad:      2.3 mmHg LVOT Vmax:         79.05 cm/s LVOT Vmean:        55.275 cm/s LVOT VTI:          0.160 m LVOT/AV VTI ratio: 0.94 AI PHT:            577 msec  AORTA Ao Root diam: 3.90 cm Ao Asc diam:  3.60 cm MITRAL VALVE                TRICUSPID VALVE MV Area (PHT): 3.93 cm     TR Peak grad:   16.8 mmHg MV Decel Time: 193 msec     TR Vmax:        205.00 cm/s MR Peak grad: 58.2 mmHg MR Vmax:      381.50 cm/s   SHUNTS MV E velocity: 124.27 cm/s  Systemic VTI:  0.16 m MV A velocity: 37.73 cm/s   Systemic Diam: 2.10 cm MV E/A ratio:  3.29 Dalton McleanMD Electronically signed by Franki Monte Signature Date/Time: 01/20/2022/1:39:40 PM  Final (Updated)    DG Chest Portable 1 View  Result Date: 01/19/2022 CLINICAL DATA:  Atrial fibrillation.  Operative ventricular rate. EXAM: PORTABLE CHEST 1 VIEW COMPARISON:  AP chest 12/27/2019, chest two views 08/16/2016 FINDINGS: Cardiac silhouette is again mildly enlarged. Mediastinal contours are within normal limits. Moderate calcification within the aortic arch. Increased lucencies within the upper lungs again suggesting chronic emphysematous changes. No pleural effusion or  pneumothorax. Moderate multilevel degenerative disc changes of the thoracic spine. IMPRESSION: 1. No active disease. 2. Chronic emphysematous changes of the upper lungs. Electronically Signed   By: Yvonne Kendall M.D.   On: 01/19/2022 19:38    Assessment/Plan 1. Abnormal EKG - elevated HR of 140 01/26/2022 - EKG today indicates A-flutter, HR 70 - some fatigue - start lopressor 12.5 mg po once - increase lopressor to 25 mg po BID - cardiology appointment moved to 10/17- on cancellation list as well  2. PAF (paroxysmal atrial fibrillation) ([place]) - see above - TSH 2.428, T4 0.84 01/19/2022 - hospitalized 10/03- 10/10 due to Afib with RVR - cardizem drip stopped due to orthostatic hypotension  3. Recurrent UTI - cont Macrobid - UTI during hospitalization- given IV antibiotics    Family/ staff Communication: plan discussed with patient, son and nurse  Labs/tests ordered:  none

## 2022-02-01 LAB — BASIC METABOLIC PANEL
BUN: 23 — AB (ref 4–21)
CO2: 23 — AB (ref 13–22)
Chloride: 107 (ref 99–108)
Creatinine: 0.7 (ref 0.5–1.1)
Glucose: 99
Potassium: 4.3 mEq/L (ref 3.5–5.1)
Sodium: 144 (ref 137–147)

## 2022-02-01 LAB — COMPREHENSIVE METABOLIC PANEL
Calcium: 9.5 (ref 8.7–10.7)
eGFR: 77

## 2022-02-01 NOTE — Progress Notes (Signed)
Primary Care Physician: [name], MD Referring Physician: NL  Cardiologist: Dr. [name]    [name] is a 88 y.o. female with a h/o paroxysmal afib, prior TIA, HTN who presents for follow up in the [facility]. She is on apixaban for a CHA2DS2VASc score of 6. Patient reports that overall her afib has been well controlled. She will occasionally take a PRN diltiazem which quickly resolves her palpitations.  On 08/21/20 she had an episode of dizziness and almost fell. She did not lose consciousness. She went to the ED and evaluation at that time was unremarkable. She was in SR. Vertigo was suspected and she was started on meclizine.   Patient was admitted 01/19/22 with generalized weakness. She was found to be in afib with RVR in the setting of a UTI and dehydration. She also had orthostatic hypotension and her amlodipine was discontinued at discharge. She spontaneously converted to SR with treatment of her UTI.   On follow up today, patient did have an additional episode of atrial flutter on 01/27/22, identified at her visit with Windell Moulding NP, ECG not available for review. She is in SR today.   Today, she denies symptoms of palpitations, chest pain, shortness of breath, orthopnea, PND, lower extremity edema, presyncope, syncope, or neurologic sequela. The patient is tolerating medications without difficulties and is otherwise without complaint today.   Past Medical History:  Diagnosis Date   Allergic rhinitis    Arthritis    "hands, little in my feet" (06/22/2016)   Candidiasis of vulva and vagina    Cystocele, midline    Disorder of bone and cartilage, unspecified    Fibroids    Hypertension    Large hiatal hernia 06/23/2016   Migraine    "none in the last few years" (06/22/2016)   Osteopenia    Other chronic cystitis    Other sign and symptom in breast    Paroxysmal atrial fibrillation ([place]) 11/30/2012   Pneumonia    "I've had walking pneumonia a  couple times"  (06/22/2016)   Pneumonia dx'd 06/15/2016   Rectal incontinence    Stroke ([place]) 2014   hx of mini stroke    TIA (transient ischemic attack) 2015   Past Surgical History:  Procedure Laterality Date   CARDIOVERSION N/A 06/29/2016   Procedure: CARDIOVERSION;  Surgeon: Jerline Pain, MD;  Location: [facility];  Service: Cardiovascular;  Laterality: N/A;   CATARACT EXTRACTION W/ INTRAOCULAR LENS IMPLANT Left    DILATION AND CURETTAGE OF UTERUS     hiatel hernia  08/20/2016   HYSTEROSCOPY WITH D & C  03/16/1999   INSERTION OF MESH N/A 08/20/2016   Procedure: INSERTION OF MESH;  Surgeon: Ralene Ok, MD;  Location: [facility];  Service: General;  Laterality: N/A;   TEE WITHOUT CARDIOVERSION N/A 11/06/2012   Procedure: TRANSESOPHAGEAL ECHOCARDIOGRAM (TEE);  Surgeon: Lelon Perla, MD;  Location: [facility] ENDOSCOPY;  Service: Cardiovascular;  Laterality: N/A;   TONSILLECTOMY  1937   VAGINAL HYSTERECTOMY  09/2005   Anterior repair; Removal of urethral caruncle./notes 09/02/2015    Current Outpatient Medications  Medication Sig Dispense Refill   acetaminophen (TYLENOL) 500 MG tablet Take 500-1,000 mg by mouth every 6 (six) hours as needed (for headache/pain.).     ALPRAZolam (XANAX) 0.25 MG tablet Take 1 tablet (0.25 mg total) by mouth 2 (two) times daily as needed for anxiety. 4 tablet 0   CALCIUM CARBONATE-VIT D-MIN PO Take 2 tablets by mouth 3 (  three) times a week.      clotrimazole (MYCELEX) 10 MG troche Take by mouth 2 (two) times daily.     CRANBERRY PO Take 1 tablet by mouth daily.     ELIQUIS 5 MG TABS tablet TAKE 1 TABLET BY MOUTH TWICE DAILY 180 tablet 1   estradiol (ESTRACE) 0.1 MG/GM vaginal cream Place 1 Applicatorful vaginally 3 (three) times a week.     Eyelid Cleansers (OCUSOFT EYELID CLEANSING EX) Apply 1 application  topically as needed (dry eyes).     Glucosamine-Chondroitin (COSAMIN DS PO) Take 1 tablet by mouth 2 (two) times daily.     loperamide (IMODIUM A-D) 2 MG  tablet Take 2 mg by mouth as needed for diarrhea or loose stools.     loratadine (CLARITIN) 10 MG tablet Take 10 mg by mouth daily as needed for allergies.      metoprolol tartrate (LOPRESSOR) 25 MG tablet Take 1 tablet (25 mg total) by mouth 2 (two) times daily. Hold if SBP < 110 30 tablet 0   nitrofurantoin, macrocrystal-monohydrate, (MACROBID) 100 MG capsule Take 100 mg by mouth at bedtime.     ondansetron (ZOFRAN) 4 MG tablet Take 1 tablet (4 mg total) by mouth every 8 (eight) hours as needed for nausea or vomiting. 20 tablet 0   Polyethyl Glycol-Propyl Glycol (SYSTANE OP) Place 1 drop into both eyes 2 (two) times daily as needed (for dry eyes).      pravastatin (PRAVACHOL) 40 MG tablet TAKE 1 TABLET(40 MG) BY MOUTH DAILY 90 tablet 1   Probiotic Product (PROBIOTIC DAILY) CAPS Take 2 capsules by mouth daily.     saccharomyces boulardii (FLORASTOR) 250 MG capsule Take 1 capsule (250 mg total) by mouth 2 (two) times daily for 7 days. 14 capsule 0   No current facility-administered medications for this encounter.    Allergies  Allergen Reactions   Lactose Other (See Comments)    Dairy Sensitivity  Dairy Sensitivity     Lisinopril Other (See Comments)    DIZZY   Bactrim [Sulfamethoxazole-Trimethoprim] Rash    Social History   Socioeconomic History   Marital status: Widowed    Spouse name: Not on file   Number of children: 2   Years of education: Not on file   Highest education level: Not on file  Occupational History   Not on file  Tobacco Use   Smoking status: Never   Smokeless tobacco: Never   Tobacco comments:    Never smoke 10/01/21  Vaping Use   Vaping Use: Never used  Substance and Sexual Activity   Alcohol use: Yes    Alcohol/week: 1.0 standard drink of alcohol    Types: 1 Glasses of wine per week    Comment: one glass of wine once a month 10/01/21   Drug use: No   Sexual activity: Not Currently  Other Topics Concern   Not on file  Social History Narrative    Patient is an independent resident at [employer] and lives by herself-no pets.   Past profession was a Agricultural engineer, Network engineer, and Writer.   Exercise by walking everyday.    Patient has a living will. POA and DNR.    Social Determinants of Health   Financial Resource Strain: Low Risk  (12/14/2020)   Overall Financial Resource Strain (CARDIA)    Difficulty of Paying Living Expenses: Not hard at all  Food Insecurity: No Food Insecurity (12/14/2020)   Hunger Vital Sign    Worried About Running Out of Food  in the Last Year: Never true    [place] in the Last Year: Never true  Transportation Needs: No Transportation Needs (12/14/2020)   PRAPARE - Hydrologist (Medical): No    Lack of Transportation (Non-Medical): No  Physical Activity: Insufficiently Active (12/14/2020)   Exercise Vital Sign    Days of Exercise per Week: 7 days    Minutes of Exercise per Session: 10 min  Stress: Stress Concern Present (12/14/2020)   [place]    Feeling of Stress : To some extent  Social Connections: Moderately Isolated (12/14/2020)   Social Connection and Isolation Panel [NHANES]    Frequency of Communication with Friends and Family: Three times a week    Frequency of Social Gatherings with Friends and Family: Three times a week    Attends Religious Services: More than 4 times per year    Active Member of Clubs or Organizations: No    Attends Archivist Meetings: Never    Marital Status: Widowed  Intimate Partner Violence: Not At Risk (12/14/2020)   Humiliation, Afraid, Rape, and Kick questionnaire    Fear of Current or Ex-Partner: No    Emotionally Abused: No    Physically Abused: No    Sexually Abused: No    Family History  Problem Relation Age of Onset   Cancer Mother    Hypertension Mother    Heart disease Father    Heart disease Brother    Emphysema Brother     ROS- All systems are  reviewed and negative except as per the HPI above  Physical Exam: Vitals:   02/02/22 0845  BP: 116/60  Pulse: (!) 59  Weight: 65.8 kg  Height: '5\' 6"'$  (1.676 m)     Wt Readings from Last 3 Encounters:  02/02/22 65.8 kg  01/27/22 65.8 kg  01/26/22 65.8 kg    Labs: Lab Results  Component Value Date   NA 139 01/26/2022   K 3.8 01/26/2022   CL 106 01/26/2022   CO2 24 01/26/2022   GLUCOSE 107 (H) 01/26/2022   BUN 13 01/26/2022   CREATININE 0.65 01/26/2022   CALCIUM 8.7 (L) 01/26/2022   MG 2.0 01/26/2022   Lab Results  Component Value Date   INR 1.5 (H) 12/27/2019   Lab Results  Component Value Date   CHOL 155 04/24/2018   HDL 50 04/24/2018   LDLCALC 71 04/24/2018   TRIG 171 (H) 04/24/2018    GEN- The patient is a well appearing elderly female, alert and oriented x 3 today.   HEENT-head normocephalic, atraumatic, sclera clear, conjunctiva pink, hearing intact, trachea midline. Lungs- Clear to ausculation bilaterally, normal work of breathing Heart- Regular rate and rhythm, no murmurs, rubs or gallops  GI- soft, NT, ND, + BS Extremities- no clubbing, cyanosis, or edema MS- no significant deformity or atrophy Skin- no rash or lesion Psych- euthymic mood, full affect Neuro- strength and sensation are intact   EKG-  SR Vent. rate 59 BPM PR interval 198 ms QRS duration 78 ms QT/QTcB 484/479 ms   Assessment and Plan: 1. Paroxysmal atrial fibrillation  Suspect recent afib was triggered by her UTI. However, she has had intermittent episodes since.  We discussed rhythm control options today including dofetilide and amiodarone. She would only qualify for the lowest dose of dofetilide. She is likely not an ablation candidate with her age. Will start amiodarone 200 mg BID x 2 weeks  then decrease to once daily. Recent lab work reviewed.  Continue Eliquis 5 mg BID Continue diltiazem 30 mg PRN for heart racing. Continue Lopressor 25 mg BID  2. CHA2DS2VASc score of at  least 6 Continue apixaban 5 mg bid   3. HTN Stable, no changes today.   Follow up in the AF clinic in 2 weeks.    [facility] [address] [place], [place] [address] [phone]

## 2022-02-02 ENCOUNTER — Ambulatory Visit (HOSPITAL_COMMUNITY)
Admission: RE | Admit: 2022-02-02 | Discharge: 2022-02-02 | Disposition: A | Discharge status: Home / Self Care | Payer: Medicare Other | Source: Ambulatory Visit | Attending: Physician Assistant | Admitting: Physician Assistant

## 2022-02-02 ENCOUNTER — Encounter (HOSPITAL_COMMUNITY): Payer: Self-pay | Admitting: Physician Assistant

## 2022-02-02 ENCOUNTER — Encounter: Payer: Self-pay | Admitting: Internal Medicine

## 2022-02-02 VITALS — BP 116/60 | HR 59 | Ht 66.0 in | Wt 145.0 lb

## 2022-02-02 DIAGNOSIS — I480 Paroxysmal atrial fibrillation: Secondary | ICD-10-CM

## 2022-02-02 DIAGNOSIS — I10 Essential (primary) hypertension: Secondary | ICD-10-CM | POA: Diagnosis not present

## 2022-02-02 DIAGNOSIS — Z8673 Personal history of transient ischemic attack (TIA), and cerebral infarction without residual deficits: Secondary | ICD-10-CM | POA: Insufficient documentation

## 2022-02-02 DIAGNOSIS — D6869 Other thrombophilia: Secondary | ICD-10-CM

## 2022-02-02 DIAGNOSIS — Z7901 Long term (current) use of anticoagulants: Secondary | ICD-10-CM | POA: Diagnosis not present

## 2022-02-02 MED ORDER — AMIODARONE HCL 200 MG PO TABS: ORAL_TABLET | ORAL | 0 refills | Status: DC

## 2022-02-02 NOTE — Patient Instructions (Signed)
Start Amiodarone '200mg'$  twice a day for 2 weeks then reduce to once a day WITH FOOD

## 2022-02-08 ENCOUNTER — Ambulatory Visit: Payer: Medicare Other | Admitting: Physician Assistant

## 2022-02-08 ENCOUNTER — Encounter: Payer: Self-pay | Admitting: Adult Health

## 2022-02-08 ENCOUNTER — Non-Acute Institutional Stay (SKILLED_NURSING_FACILITY): Payer: Medicare Other | Admitting: Adult Health

## 2022-02-08 DIAGNOSIS — G44209 Tension-type headache, unspecified, not intractable: Secondary | ICD-10-CM | POA: Diagnosis not present

## 2022-02-08 DIAGNOSIS — I480 Paroxysmal atrial fibrillation: Secondary | ICD-10-CM | POA: Diagnosis not present

## 2022-02-08 DIAGNOSIS — R0981 Nasal congestion: Secondary | ICD-10-CM | POA: Diagnosis not present

## 2022-02-08 NOTE — Progress Notes (Signed)
Location:  Occupational psychologist of Service:  SNF (31) Provider:   Cindi Carbon, [place] [phone]   [name], MD  Patient Care Team: [name], MD as PCP - General (Internal Medicine) [name], [name], MD as PCP - Cardiology (Cardiology) [name], [name], MD as Consulting Physician (Cardiology) [name], NP as Nurse Practitioner [name], MD as Consulting Physician (Urology) [name], [place] as Physician Assistant (Cardiology) [name], MD as Consulting Physician (Family Medicine) [name], MD as Consulting Physician (Urology)  Extended Emergency Contact Information Primary Emergency Contact: [name] of [place] Phone: [phone] Mobile Phone: [phone] Relation: Son Secondary Emergency Contact: [facility] Address: [address]          [place], [place] [address] Johnnette Spencer of [name] Phone: [phone] Relation: Daughter   Goals of care: Advanced Directive information    01/26/2022    3:23 PM  Advanced Directives  Does Patient Have a Medical Advance Directive? Yes  Type of Advance Directive Out of facility DNR (pink MOST or yellow form)  Does patient want to make changes to medical advance directive? No - Patient declined     Chief Complaint  Patient presents with   Acute Visit    headache    HPI:  Pt is a 88 y.o. female seen today for an acute visit for headache and nasal congestion. Reports right sided nasal congestion, right sided occiput headache, worse as the day goes on. No sore throat, cough, or fever. Has been present since arriving to [facility] from the hospital 01/26/22. Rapid covid 02/08/22 negative.  Has a hx of migraine per record. Denies any aura, nausea, etc. No visual changes.  No focal deficit. Headache improves with tylenol. She feels stressed about being in rehab and may have  some anxiety.   She was admitted to the hospital due to weakness and found to be in rapid afib. Rate converted back to sinus with BB. ON Eliquis for CVA risk reduction. During her stay was treated for MDR Ecoli UTI. Also had severe orthostatic hypotension. Treated with fluids and improved.   Saw cardiology on 10/17 and was started on amiodarone due to tachycardia and low bp. Amiodarone started. Metoprolol was held this am due to HR in the 50's.  Past Medical History:  Diagnosis Date   Allergic rhinitis    Arthritis    "hands, little in my feet" (06/22/2016)   Candidiasis of vulva and vagina    Cystocele, midline    Disorder of bone and cartilage, unspecified    Fibroids    Hypertension    Large hiatal hernia 06/23/2016   Migraine    "none in the last few years" (06/22/2016)   Osteopenia    Other chronic cystitis    Other sign and symptom in breast    Paroxysmal atrial fibrillation ([place]) 11/30/2012   Pneumonia    "I've had walking pneumonia a couple times"  (06/22/2016)   Pneumonia dx'd 06/15/2016   Rectal incontinence    Stroke ([place]) 2014   hx of mini stroke    TIA (transient ischemic attack) 2015   Past Surgical History:  Procedure Laterality Date   CARDIOVERSION N/A 06/29/2016   Procedure: CARDIOVERSION;  Surgeon: Jerline Pain, MD;  Location: [facility];  Service: Cardiovascular;  Laterality: N/A;   CATARACT EXTRACTION W/ INTRAOCULAR  LENS IMPLANT Left    DILATION AND CURETTAGE OF UTERUS     hiatel hernia  08/20/2016   HYSTEROSCOPY WITH D & C  03/16/1999   INSERTION OF MESH N/A 08/20/2016   Procedure: INSERTION OF MESH;  Surgeon: Ralene Ok, MD;  Location: [facility];  Service: General;  Laterality: N/A;   TEE WITHOUT CARDIOVERSION N/A 11/06/2012   Procedure: TRANSESOPHAGEAL ECHOCARDIOGRAM (TEE);  Surgeon: Lelon Perla, MD;  Location: [facility] ENDOSCOPY;  Service: Cardiovascular;  Laterality: N/A;   TONSILLECTOMY  1937   VAGINAL HYSTERECTOMY  09/2005   Anterior repair; Removal of  urethral caruncle./notes 09/02/2015    Allergies  Allergen Reactions   Lactose Other (See Comments)    Dairy Sensitivity  Dairy Sensitivity     Lisinopril Other (See Comments)    DIZZY   Bactrim [Sulfamethoxazole-Trimethoprim] Rash    Outpatient Encounter Medications as of 02/08/2022  Medication Sig   acetaminophen (TYLENOL) 500 MG tablet Take 500 mg by mouth daily.   fluticasone (FLONASE) 50 MCG/ACT nasal spray Place 2 sprays into both nostrils daily. X 2 weeks   acetaminophen (TYLENOL) 500 MG tablet Take 500-1,000 mg by mouth every 6 (six) hours as needed (for headache/pain.).   ALPRAZolam (XANAX) 0.25 MG tablet Take 1 tablet (0.25 mg total) by mouth 2 (two) times daily as needed for anxiety.   amiodarone (PACERONE) 200 MG tablet Take 1 tablet by mouth twice a day for 2 weeks then reduce to 1 tablet daily   CALCIUM CARBONATE-VIT D-MIN PO Take 2 tablets by mouth 3 (three) times a week.    CRANBERRY PO Take 1 tablet by mouth daily.   ELIQUIS 5 MG TABS tablet TAKE 1 TABLET BY MOUTH TWICE DAILY   estradiol (ESTRACE) 0.1 MG/GM vaginal cream Place 1 Applicatorful vaginally 3 (three) times a week.   Eyelid Cleansers (OCUSOFT EYELID CLEANSING EX) Apply 1 application  topically as needed (dry eyes).   Glucosamine-Chondroitin (COSAMIN DS PO) Take 1 tablet by mouth 2 (two) times daily.   loperamide (IMODIUM A-D) 2 MG tablet Take 2 mg by mouth as needed for diarrhea or loose stools.   loratadine (CLARITIN) 10 MG tablet Take 10 mg by mouth daily as needed for allergies.    metoprolol tartrate (LOPRESSOR) 25 MG tablet Take 1 tablet (25 mg total) by mouth 2 (two) times daily. Hold if SBP < 110   nitrofurantoin, macrocrystal-monohydrate, (MACROBID) 100 MG capsule Take 100 mg by mouth at bedtime.   ondansetron (ZOFRAN) 4 MG tablet Take 1 tablet (4 mg total) by mouth every 8 (eight) hours as needed for nausea or vomiting.   Polyethyl Glycol-Propyl Glycol (SYSTANE OP) Place 1 drop into both eyes 2  (two) times daily as needed (for dry eyes).    pravastatin (PRAVACHOL) 40 MG tablet TAKE 1 TABLET(40 MG) BY MOUTH DAILY   Probiotic Product (PROBIOTIC DAILY) CAPS Take 2 capsules by mouth daily.   [DISCONTINUED] clotrimazole (MYCELEX) 10 MG troche Take by mouth 2 (two) times daily.   No facility-administered encounter medications on file as of 02/08/2022.    Review of Systems  Constitutional:  Negative for activity change, appetite change, chills, diaphoresis, fatigue, fever and unexpected weight change.  HENT:  Positive for congestion, rhinorrhea, sinus pressure and sinus pain. Negative for sore throat and trouble swallowing.   Respiratory:  Negative for cough, shortness of breath and wheezing.   Cardiovascular:  Positive for leg swelling. Negative for chest pain and palpitations.  Gastrointestinal:  Negative for abdominal distention, abdominal  pain, constipation and diarrhea.  Genitourinary:  Negative for difficulty urinating and dysuria.  Musculoskeletal:  Negative for arthralgias, back pain, joint swelling and myalgias.  Neurological:  Positive for headaches. Negative for dizziness, tremors, seizures, syncope, facial asymmetry, speech difficulty, weakness, light-headedness and numbness.  Psychiatric/Behavioral:  Positive for confusion. Negative for agitation and behavioral problems.     Immunization History  Administered Date(s) Administered   Fluad Quad(high Dose 65+) 01/09/2019, 01/19/2022   Influenza Split 12/18/2013   Influenza, High Dose Seasonal PF 01/24/2018   Influenza-Unspecified 01/12/2015, 01/13/2016, 01/20/2017, 01/24/2018, 01/06/2021   Moderna Covid-19 Vaccine Bivalent Booster 89yr & up 01/30/2021   Moderna Sars-Covid-2 Vaccination 05/01/2019, 05/29/2019, 03/04/2020   Pneumococcal Conjugate-13 10/02/2013   Pneumococcal Polysaccharide-23 12/09/2014   Td 05/16/1995, 12/10/2004   Tdap 04/19/2004, 02/20/2016   Zoster Recombinat (Shingrix) 10/29/2016, 03/21/2017    Zoster, Live 02/11/2006   Pertinent  Health Maintenance Due  Topic Date Due   INFLUENZA VACCINE  Completed   DEXA SCAN  Completed   MAMMOGRAM  Discontinued      01/23/2022    7:45 AM 01/23/2022    7:45 PM 01/25/2022    2:09 AM 01/25/2022    7:25 AM 01/25/2022    7:50 PM  Fall Risk  Patient Fall Risk Level High fall risk High fall risk High fall risk High fall risk High fall risk   Functional Status Survey:    Vitals:   02/08/22 1617  BP: (!) 148/61  Pulse: (!) 50  Resp: 14  Temp: (!) 97 F (36.1 C)  SpO2: 95%   There is no height or weight on file to calculate BMI. Physical Exam Vitals and nursing note reviewed.  Constitutional:      General: She is not in acute distress.    Appearance: She is not diaphoretic.  HENT:     Head: Normocephalic and atraumatic.     Nose: Congestion present.     Mouth/Throat:     Mouth: Mucous membranes are moist.     Pharynx: Oropharynx is clear. No oropharyngeal exudate.  Eyes:     Extraocular Movements: Extraocular movements intact.     Conjunctiva/sclera: Conjunctivae normal.     Pupils: Pupils are equal, round, and reactive to light.  Neck:     Vascular: No JVD.  Cardiovascular:     Rate and Rhythm: Regular rhythm. Bradycardia present.     Heart sounds: No murmur heard. Pulmonary:     Effort: Pulmonary effort is normal. No respiratory distress.     Breath sounds: Normal breath sounds. No wheezing.  Musculoskeletal:     Cervical back: No rigidity or tenderness.     Right lower leg: Edema present.     Left lower leg: Edema present.  Lymphadenopathy:     Cervical: No cervical adenopathy.  Skin:    General: Skin is warm and dry.  Neurological:     General: No focal deficit present.     Mental Status: She is alert. Mental status is at baseline.     Cranial Nerves: No cranial nerve deficit.     Motor: No weakness.  Psychiatric:        Mood and Affect: Mood normal.     Labs reviewed: Recent Labs    01/24/22 1146  01/25/22 0020 01/26/22 0105 02/01/22 0000  NA 141 141 139 144  K 3.8 3.9 3.8 4.3  CL 107 107 106 107  CO2 '26 23 24 '$ 23*  GLUCOSE 102* 97 107*  --   BUN 20  21 13 23*  CREATININE 0.74 0.81 0.65 0.7  CALCIUM 9.1 8.9 8.7* 9.5  MG 2.0 1.9 2.0  --    Recent Labs    01/19/22 1946  AST 30  ALT 18  ALKPHOS 61  BILITOT 0.6  PROT 8.5*  ALBUMIN 4.6   Recent Labs    01/19/22 1946 01/21/22 0018 01/22/22 0545 01/23/22 0038  WBC 8.9 13.1* 8.6 8.7  NEUTROABS 6.4  --   --   --   HGB 15.9* 13.7 12.7 12.3  HCT 47.7* 41.1 38.5 37.9  MCV 92.4 90.5 91.4 92.0  PLT 263 264 229 269   Lab Results  Component Value Date   TSH 2.428 01/19/2022   Lab Results  Component Value Date   HGBA1C 5.9 (A) 12/04/2019   Lab Results  Component Value Date   CHOL 155 04/24/2018   HDL 50 04/24/2018   LDLCALC 71 04/24/2018   TRIG 171 (H) 04/24/2018   CHOLHDL 3.1 04/24/2018    Significant Diagnostic Results in last 30 days:  DG Abd 2 Views  Result Date: 01/20/2022 CLINICAL DATA:  Abdominal discomfort. EXAM: ABDOMEN - 2 VIEW COMPARISON:  Abdominal radiograph and CT 06/22/2016 FINDINGS: Gas and stool are present in the colon to the level of the rectum. There is mild gaseous distension of the cecum/ascending colon. No dilated loops of bowel are seen to suggest obstruction. Aortic atherosclerosis and moderate lumbar levoscoliosis are noted. IMPRESSION: No evidence of bowel obstruction. Electronically Signed   By: Logan Bores M.D.   On: 01/20/2022 15:04   ECHOCARDIOGRAM COMPLETE  Result Date: 01/20/2022    ECHOCARDIOGRAM REPORT   Patient Name:   [name] Date of Exam: 01/20/2022 Medical Rec #:  [id number]     Height:       65.0 in Accession #:    [id number]    Weight:       149.7 lb Date of Birth:  March 21, 1931      BSA:          1.749 m Patient Age:    88 years      BP:           147/62 mmHg Patient Gender: F             HR:           76 bpm. Exam Location:  Inpatient Procedure: 2D Echo Indications:      atrial fibrillation  History:         Patient has prior history of Echocardiogram examinations, most                  recent 06/28/2016. TIA, Arrythmias:Atrial Fibrillation; Risk                  Factors:Hypertension and Dyslipidemia.  Sonographer:     Harvie Junior Referring Phys:  [id number] [place] Diagnosing Phys: Franki Monte  Sonographer Comments: Technically difficult study due to poor echo windows. Image acquisition challenging due to respiratory motion and talking. IMPRESSIONS  1. Left ventricular ejection fraction, by estimation, is 50%. The left ventricle has mildly decreased function. The left ventricle demonstrates global hypokinesis. Left ventricular diastolic parameters are indeterminate.  2. Right ventricular systolic function is normal. The right ventricular size is normal. There is normal pulmonary artery systolic pressure. The estimated right ventricular systolic pressure is 93.2 mmHg.  3. Left atrial size was mildly dilated.  4. The mitral valve is normal in structure. Mild mitral valve regurgitation. No evidence  of mitral stenosis. Moderate mitral annular calcification.  5. The aortic valve is tricuspid. There is mild calcification of the aortic valve. Aortic valve regurgitation is trivial. No aortic stenosis is present.  6. Aortic dilatation noted. There is mild dilatation of the aortic root, measuring 39 mm.  7. The inferior vena cava is normal in size with greater than 50% respiratory variability, suggesting right atrial pressure of 3 mmHg.  8. The patient is in atrial fibrillation. FINDINGS  Left Ventricle: Left ventricular ejection fraction, by estimation, is 50%. The left ventricle has mildly decreased function. The left ventricle demonstrates global hypokinesis. The left ventricular internal cavity size was normal in size. There is no left ventricular hypertrophy. Left ventricular diastolic parameters are indeterminate. Right Ventricle: The right ventricular size is normal. No  increase in right ventricular wall thickness. Right ventricular systolic function is normal. There is normal pulmonary artery systolic pressure. The tricuspid regurgitant velocity is 2.05 m/s, and  with an assumed right atrial pressure of 3 mmHg, the estimated right ventricular systolic pressure is 82.5 mmHg. Left Atrium: Left atrial size was mildly dilated. Right Atrium: Right atrial size was normal in size. Pericardium: Trivial pericardial effusion is present. Mitral Valve: The mitral valve is normal in structure. There is mild calcification of the mitral valve leaflet(s). Moderate mitral annular calcification. Mild mitral valve regurgitation. No evidence of mitral valve stenosis. Tricuspid Valve: The tricuspid valve is normal in structure. Tricuspid valve regurgitation is trivial. Aortic Valve: The aortic valve is tricuspid. There is mild calcification of the aortic valve. Aortic valve regurgitation is trivial. Aortic regurgitation PHT measures 577 msec. No aortic stenosis is present. Aortic valve mean gradient measures 2.3 mmHg. Aortic valve peak gradient measures 3.9 mmHg. Aortic valve area, by VTI measures 3.25 cm. Pulmonic Valve: The pulmonic valve was normal in structure. Pulmonic valve regurgitation is trivial. Aorta: Aortic dilatation noted. There is mild dilatation of the aortic root, measuring 39 mm. Venous: The inferior vena cava is normal in size with greater than 50% respiratory variability, suggesting right atrial pressure of 3 mmHg. IAS/Shunts: No atrial level shunt detected by color flow Doppler.  LEFT VENTRICLE PLAX 2D LVIDd:         4.40 cm     Diastology LVIDs:         3.00 cm     LV e' medial:    7.18 cm/s LV PW:         0.90 cm     LV E/e' medial:  17.3 LV IVS:        0.90 cm     LV e' lateral:   7.94 cm/s LVOT diam:     2.10 cm     LV E/e' lateral: 15.7 LV SV:         56 LV SV Index:   32 LVOT Area:     3.46 cm  LV Volumes (MOD) LV vol d, MOD A2C: 55.8 ml LV vol d, MOD A4C: 50.4 ml LV vol  s, MOD A2C: 22.2 ml LV vol s, MOD A4C: 27.8 ml LV SV MOD A2C:     33.6 ml LV SV MOD A4C:     50.4 ml LV SV MOD BP:      28.2 ml RIGHT VENTRICLE RV Basal diam:  2.40 cm RV Mid diam:    2.00 cm RV S prime:     16.10 cm/s TAPSE (M-mode): 1.9 cm LEFT ATRIUM           Index  RIGHT ATRIUM           Index LA diam:      3.60 cm 2.06 cm/m   RA Area:     13.30 cm LA Vol (A2C): 54.0 ml 30.88 ml/m  RA Volume:   23.30 ml  13.32 ml/m LA Vol (A4C): 68.6 ml 39.22 ml/m  AORTIC VALVE                    PULMONIC VALVE AV Area (Vmax):    2.78 cm     PV Vmax:       0.86 m/s AV Area (Vmean):   2.82 cm     PV Peak grad:  2.9 mmHg AV Area (VTI):     3.25 cm AV Vmax:           98.43 cm/s AV Vmean:          67.933 cm/s AV VTI:            0.171 m AV Peak Grad:      3.9 mmHg AV Mean Grad:      2.3 mmHg LVOT Vmax:         79.05 cm/s LVOT Vmean:        55.275 cm/s LVOT VTI:          0.160 m LVOT/AV VTI ratio: 0.94 AI PHT:            577 msec  AORTA Ao Root diam: 3.90 cm Ao Asc diam:  3.60 cm MITRAL VALVE                TRICUSPID VALVE MV Area (PHT): 3.93 cm     TR Peak grad:   16.8 mmHg MV Decel Time: 193 msec     TR Vmax:        205.00 cm/s MR Peak grad: 58.2 mmHg MR Vmax:      381.50 cm/s   SHUNTS MV E velocity: 124.27 cm/s  Systemic VTI:  0.16 m MV A velocity: 37.73 cm/s   Systemic Diam: 2.10 cm MV E/A ratio:  3.29 Dalton McleanMD Electronically signed by Franki Monte Signature Date/Time: 01/20/2022/1:39:40 PM    Final (Updated)    DG Chest Portable 1 View  Result Date: 01/19/2022 CLINICAL DATA:  Atrial fibrillation.  Operative ventricular rate. EXAM: PORTABLE CHEST 1 VIEW COMPARISON:  AP chest 12/27/2019, chest two views 08/16/2016 FINDINGS: Cardiac silhouette is again mildly enlarged. Mediastinal contours are within normal limits. Moderate calcification within the aortic arch. Increased lucencies within the upper lungs again suggesting chronic emphysematous changes. No pleural effusion or pneumothorax. Moderate  multilevel degenerative disc changes of the thoracic spine. IMPRESSION: 1. No active disease. 2. Chronic emphysematous changes of the upper lungs. Electronically Signed   By: Yvonne Kendall M.D.   On: 01/19/2022 19:38    Assessment/Plan 1. Acute non intractable tension-type headache Has had a headache off and on since admission  Does improve with tylenol ? Stress induced vs congestion related.  Add scheduled tylenol  If worsening or not improving may need imaging, no focal deficit at his time.   2. Nasal congestion Flonase 2 sprays each nare x 2 weeks.   3. Paroxysmal atrial fibrillation (HCC) Rate is low, BB being held.  Now on amiodarone per cardiology Nurse will reach out to them for orders and f/u apt.     Family/ staff Communication: resident   Labs/tests ordered:  NA

## 2022-02-17 ENCOUNTER — Encounter (HOSPITAL_COMMUNITY): Payer: Self-pay | Admitting: Physician Assistant

## 2022-02-17 ENCOUNTER — Ambulatory Visit (HOSPITAL_COMMUNITY)
Admission: RE | Admit: 2022-02-17 | Discharge: 2022-02-17 | Disposition: A | Discharge status: Home / Self Care | Payer: Medicare Other | Source: Ambulatory Visit | Attending: Physician Assistant | Admitting: Physician Assistant

## 2022-02-17 VITALS — BP 134/64 | HR 53 | Ht 66.0 in | Wt 149.8 lb

## 2022-02-17 DIAGNOSIS — R9431 Abnormal electrocardiogram [ECG] [EKG]: Secondary | ICD-10-CM | POA: Diagnosis not present

## 2022-02-17 DIAGNOSIS — Z79899 Other long term (current) drug therapy: Secondary | ICD-10-CM | POA: Diagnosis not present

## 2022-02-17 DIAGNOSIS — Z7901 Long term (current) use of anticoagulants: Secondary | ICD-10-CM | POA: Diagnosis not present

## 2022-02-17 DIAGNOSIS — D6869 Other thrombophilia: Secondary | ICD-10-CM

## 2022-02-17 DIAGNOSIS — R001 Bradycardia, unspecified: Secondary | ICD-10-CM | POA: Insufficient documentation

## 2022-02-17 DIAGNOSIS — I10 Essential (primary) hypertension: Secondary | ICD-10-CM | POA: Insufficient documentation

## 2022-02-17 DIAGNOSIS — I480 Paroxysmal atrial fibrillation: Secondary | ICD-10-CM | POA: Insufficient documentation

## 2022-02-17 DIAGNOSIS — Z8673 Personal history of transient ischemic attack (TIA), and cerebral infarction without residual deficits: Secondary | ICD-10-CM | POA: Diagnosis not present

## 2022-02-17 MED ORDER — AMIODARONE HCL 200 MG PO TABS: 200.0000 mg | ORAL_TABLET | Freq: Every day | ORAL | 3 refills | Status: DC

## 2022-02-17 MED ORDER — METOPROLOL TARTRATE 25 MG PO TABS: 12.5000 mg | ORAL_TABLET | Freq: Two times a day (BID) | ORAL | 0 refills | Status: DC

## 2022-02-17 NOTE — Progress Notes (Signed)
Primary Care Physician: [name], MD Referring Physician: NL  Cardiologist: Dr. [name]    [name] is a 88 y.o. female with a h/o paroxysmal afib, prior TIA, HTN who presents for follow up in the [facility]. She is on apixaban for a CHA2DS2VASc score of 6. Patient reports that overall her afib has been well controlled. She will occasionally take a PRN diltiazem which quickly resolves her palpitations.  On 08/21/20 she had an episode of dizziness and almost fell. She did not lose consciousness. She went to the ED and evaluation at that time was unremarkable. She was in SR. Vertigo was suspected and she was started on meclizine.   Patient was admitted 01/19/22 with generalized weakness. She was found to be in afib with RVR in the setting of a UTI and dehydration. She also had orthostatic hypotension and her amlodipine was discontinued at discharge. She spontaneously converted to SR with treatment of her UTI. Patient did have an additional episode of atrial flutter on 01/27/22, identified at her visit with Windell Moulding NP. Started on amiodarone 02/02/22.   On follow up today, patient reports that she has been feeling well from a cardiac standpoint. She has not had any afib or atrial flutter that she is aware of, in SR today. No bleeding issues on anticoagulation. She is having issues with allergies.   Today, she denies symptoms of palpitations, chest pain, shortness of breath, orthopnea, PND, lower extremity edema, presyncope, syncope, or neurologic sequela. The patient is tolerating medications without difficulties and is otherwise without complaint today.   Past Medical History:  Diagnosis Date   Allergic rhinitis    Arthritis    "hands, little in my feet" (06/22/2016)   Candidiasis of vulva and vagina    Cystocele, midline    Disorder of bone and cartilage, unspecified    Fibroids    Hypertension    Large hiatal hernia 06/23/2016   Migraine    "none in the  last few years" (06/22/2016)   Osteopenia    Other chronic cystitis    Other sign and symptom in breast    Paroxysmal atrial fibrillation ([place]) 11/30/2012   Pneumonia    "I've had walking pneumonia a couple times"  (06/22/2016)   Pneumonia dx'd 06/15/2016   Rectal incontinence    Stroke ([place]) 2014   hx of mini stroke    TIA (transient ischemic attack) 2015   Past Surgical History:  Procedure Laterality Date   CARDIOVERSION N/A 06/29/2016   Procedure: CARDIOVERSION;  Surgeon: Jerline Pain, MD;  Location: [facility];  Service: Cardiovascular;  Laterality: N/A;   CATARACT EXTRACTION W/ INTRAOCULAR LENS IMPLANT Left    DILATION AND CURETTAGE OF UTERUS     hiatel hernia  08/20/2016   HYSTEROSCOPY WITH D & C  03/16/1999   INSERTION OF MESH N/A 08/20/2016   Procedure: INSERTION OF MESH;  Surgeon: Ralene Ok, MD;  Location: [facility];  Service: General;  Laterality: N/A;   TEE WITHOUT CARDIOVERSION N/A 11/06/2012   Procedure: TRANSESOPHAGEAL ECHOCARDIOGRAM (TEE);  Surgeon: Lelon Perla, MD;  Location: [facility] ENDOSCOPY;  Service: Cardiovascular;  Laterality: N/A;   TONSILLECTOMY  1937   VAGINAL HYSTERECTOMY  09/2005   Anterior repair; Removal of urethral caruncle./notes 09/02/2015    Current Outpatient Medications  Medication Sig Dispense Refill   acetaminophen (TYLENOL) 500 MG tablet Take 500 mg by mouth daily.     ALPRAZolam (XANAX) 0.25 MG tablet Take 1 tablet (0.25  mg total) by mouth 2 (two) times daily as needed for anxiety. 4 tablet 0   amiodarone (PACERONE) 200 MG tablet Take 1 tablet by mouth twice a day for 2 weeks then reduce to 1 tablet daily 60 tablet 0   CALCIUM CARBONATE-VIT D-MIN PO Take 2 tablets by mouth 3 (three) times a week.      CRANBERRY PO Take 1 tablet by mouth daily.     ELIQUIS 5 MG TABS tablet TAKE 1 TABLET BY MOUTH TWICE DAILY 180 tablet 1   estradiol (ESTRACE) 0.1 MG/GM vaginal cream Place 1 Applicatorful vaginally 3 (three) times a week.     Eyelid Cleansers  (OCUSOFT EYELID CLEANSING EX) Apply 1 application  topically as needed (dry eyes).     fluticasone (FLONASE) 50 MCG/ACT nasal spray Place 2 sprays into both nostrils daily. X 2 weeks     Glucosamine-Chondroitin (COSAMIN DS PO) Take 1 tablet by mouth 2 (two) times daily.     loperamide (IMODIUM A-D) 2 MG tablet Take 2 mg by mouth as needed for diarrhea or loose stools.     loratadine (CLARITIN) 10 MG tablet Take 10 mg by mouth daily as needed for allergies.      metoprolol tartrate (LOPRESSOR) 25 MG tablet Take 1 tablet (25 mg total) by mouth 2 (two) times daily. Hold if SBP < 110 30 tablet 0   nitrofurantoin, macrocrystal-monohydrate, (MACROBID) 100 MG capsule Take 100 mg by mouth at bedtime.     ondansetron (ZOFRAN) 4 MG tablet Take 1 tablet (4 mg total) by mouth every 8 (eight) hours as needed for nausea or vomiting. 20 tablet 0   Polyethyl Glycol-Propyl Glycol (SYSTANE OP) Place 1 drop into both eyes 2 (two) times daily as needed (for dry eyes).      pravastatin (PRAVACHOL) 40 MG tablet TAKE 1 TABLET(40 MG) BY MOUTH DAILY 90 tablet 1   Probiotic Product (PROBIOTIC DAILY) CAPS Take 2 capsules by mouth daily.     No current facility-administered medications for this encounter.    Allergies  Allergen Reactions   Lactose Other (See Comments)    Dairy Sensitivity  Dairy Sensitivity     Lisinopril Other (See Comments)    DIZZY   Bactrim [Sulfamethoxazole-Trimethoprim] Rash    Social History   Socioeconomic History   Marital status: Widowed    Spouse name: Not on file   Number of children: 2   Years of education: Not on file   Highest education level: Not on file  Occupational History   Not on file  Tobacco Use   Smoking status: Never   Smokeless tobacco: Never   Tobacco comments:    Never smoke 10/01/21  Vaping Use   Vaping Use: Never used  Substance and Sexual Activity   Alcohol use: Yes    Alcohol/week: 1.0 standard drink of alcohol    Types: 1 Glasses of wine per week     Comment: one glass of wine once a month 10/01/21   Drug use: No   Sexual activity: Not Currently  Other Topics Concern   Not on file  Social History Narrative   Patient is an independent resident at [facility] and lives by herself-no pets.   Past profession was a Agricultural engineer, Network engineer, and Writer.   Exercise by walking everyday.    Patient has a living will. POA and DNR.    Social Determinants of Health   Financial Resource Strain: Low Risk  (12/14/2020)   Overall Financial Resource Strain (CARDIA)  Difficulty of Paying Living Expenses: Not hard at all  Food Insecurity: No Food Insecurity (12/14/2020)   Hunger Vital Sign    Worried About Running Out of Food in the Last Year: Never true    Ran Out of Food in the Last Year: Never true  Transportation Needs: No Transportation Needs (12/14/2020)   PRAPARE - Hydrologist (Medical): No    Lack of Transportation (Non-Medical): No  Physical Activity: Insufficiently Active (12/14/2020)   Exercise Vital Sign    Days of Exercise per Week: 7 days    Minutes of Exercise per Session: 10 min  Stress: Stress Concern Present (12/14/2020)   [place]    Feeling of Stress : To some extent  Social Connections: Moderately Isolated (12/14/2020)   Social Connection and Isolation Panel [NHANES]    Frequency of Communication with Friends and Family: Three times a week    Frequency of Social Gatherings with Friends and Family: Three times a week    Attends Religious Services: More than 4 times per year    Active Member of Clubs or Organizations: No    Attends Archivist Meetings: Never    Marital Status: Widowed  Intimate Partner Violence: Not At Risk (12/14/2020)   Humiliation, Afraid, Rape, and Kick questionnaire    Fear of Current or Ex-Partner: No    Emotionally Abused: No    Physically Abused: No    Sexually Abused: No    Family History   Problem Relation Age of Onset   Cancer Mother    Hypertension Mother    Heart disease Father    Heart disease Brother    Emphysema Brother     ROS- All systems are reviewed and negative except as per the HPI above  Physical Exam: Vitals:   02/17/22 1337  BP: 134/64  Pulse: (!) 53  Weight: 67.9 kg  Height: '5\' 6"'$  (1.676 m)    Wt Readings from Last 3 Encounters:  02/17/22 67.9 kg  02/02/22 65.8 kg  01/27/22 65.8 kg    Labs: Lab Results  Component Value Date   NA 144 02/01/2022   K 4.3 02/01/2022   CL 107 02/01/2022   CO2 23 (A) 02/01/2022   GLUCOSE 107 (H) 01/26/2022   BUN 23 (A) 02/01/2022   CREATININE 0.7 02/01/2022   CALCIUM 9.5 02/01/2022   MG 2.0 01/26/2022   Lab Results  Component Value Date   INR 1.5 (H) 12/27/2019   Lab Results  Component Value Date   CHOL 155 04/24/2018   HDL 50 04/24/2018   LDLCALC 71 04/24/2018   TRIG 171 (H) 04/24/2018    GEN- The patient is a well appearing elderly female, alert and oriented x 3 today.   HEENT-head normocephalic, atraumatic, sclera clear, conjunctiva pink, hearing intact, trachea midline. Lungs- Clear to ausculation bilaterally, normal work of breathing Heart- Regular rate and rhythm, bradycardia, no murmurs, rubs or gallops  GI- soft, NT, ND, + BS Extremities- no clubbing, cyanosis, or edema MS- no significant deformity or atrophy Skin- no rash or lesion Psych- euthymic mood, full affect Neuro- strength and sensation are intact   EKG-  SB, 1st degree AV block Vent. rate 53 BPM PR interval 232 ms QRS duration 74 ms QT/QTcB 514/482 ms   Assessment and Plan: 1. Paroxysmal atrial fibrillation  Patient appears to be maintaining SR. Continue amiodarone 200 mg daily (starting daily dose today). Will plan to check cmet/TSH  on follow up. Continue Eliquis 5 mg BID (weight > 60 kg, Cr < 1.5) Continue diltiazem 30 mg PRN for heart racing. Decrease Lopressor 12.5 mg BID given bradycardia.   2. CHA2DS2VASc  score of at least 6 Continue apixaban 5 mg BID   3. HTN Stable, med changes as above.    Follow up in the AF clinic in 3 months.    [facility] [address] [place], [place] [address] [phone]

## 2022-02-22 ENCOUNTER — Non-Acute Institutional Stay (SKILLED_NURSING_FACILITY): Payer: Medicare Other | Admitting: Internal Medicine

## 2022-02-22 ENCOUNTER — Encounter: Payer: Self-pay | Admitting: Internal Medicine

## 2022-02-22 DIAGNOSIS — I480 Paroxysmal atrial fibrillation: Secondary | ICD-10-CM

## 2022-02-22 DIAGNOSIS — I10 Essential (primary) hypertension: Secondary | ICD-10-CM

## 2022-02-22 DIAGNOSIS — E785 Hyperlipidemia, unspecified: Secondary | ICD-10-CM

## 2022-02-22 DIAGNOSIS — F419 Anxiety disorder, unspecified: Secondary | ICD-10-CM

## 2022-02-22 NOTE — Progress Notes (Signed)
Location:  [place] Room Number: Rehab 157A Place of Service:  SNF [phone])  Provider: Dr. Veleta Miners  PCP: [name], MD Patient Care Team: [name], MD as PCP - General (Internal Medicine) [name], [name], MD as PCP - Cardiology (Cardiology) [name], [name], MD as Consulting Physician (Cardiology) [name], NP as Nurse Practitioner [name], MD as Consulting Physician (Urology) [name], [place] as Physician Assistant (Cardiology) [name], MD as Consulting Physician (Family Medicine) [name], MD as Consulting Physician (Urology)  Extended Emergency Contact Information Primary Emergency Contact: [place], [place] Johnnette Litter of [place] Phone: [phone] Mobile Phone: [phone] Relation: Son Secondary Emergency Contact: [facility] Address: [address]          [place], [place] [address] Johnnette Litter of [place] Phone: [phone] Relation: Daughter  Code Status: DNR Goals of care:  Advanced Directive information    01/26/2022    3:23 PM  Advanced Directives  Does Patient Have a Medical Advance Directive? Yes  Type of Advance Directive Out of facility DNR (pink MOST or yellow form)  Does patient want to make changes to medical advance directive? No - Patient declined     Allergies  Allergen Reactions   Lactose Other (See Comments)    Dairy Sensitivity  Dairy Sensitivity     Lisinopril Other (See Comments)    DIZZY   Bactrim [Sulfamethoxazole-Trimethoprim] Rash    Chief Complaint  Patient presents with   Discharge Note    Discharge.     HPI:  88 y.o. female  seen for [place] SNF [place]   Patient admitted in hospital from 10/03-10/10 for A Fib with RVR    Patient has a history of PAF on Eliquis, recurrent UTI and is on Macrobid, HLD, hypertension, IBS with diarrhea, anxiety.   Patient states  that she was in her apartment and suddenly she was unable to get up.  She denies any dizziness palpitations chest pain.  [facility] was called and she was transferred to ED in ED she was found to have A-fib with RVR Her UA was positive for nitrites and leukocyte.  She was treated with IV cefepime. Culture was not done   Saw Cardiology and Now on Amiodarone after the loading dose Tolerating it well  Staff is holding her Lopressor due to Bradycardia  Patient also is having issues with her Cognition MMSE here was 19/30 1/3 in recall also missed on Orientation  Family wants to try [place] with Caregivers Daughter is going to help with her meds Anxiety and insomnia She says it is due to being in new place PRN xanax does help  Doing well with Gilford Rile       Past Medical History:  Diagnosis Date   Allergic rhinitis    Arthritis    "hands, little in my feet" (06/22/2016)   Candidiasis of vulva and vagina    Cystocele, midline    Disorder of bone and cartilage, unspecified    Fibroids    Hypertension    Large hiatal hernia 06/23/2016   Migraine    "none in the last few years" (06/22/2016)   Osteopenia    Other chronic cystitis    Other sign and symptom in breast    Paroxysmal atrial fibrillation ([place]) 11/30/2012   Pneumonia    "I've had walking pneumonia a  couple times"  (06/22/2016)   Pneumonia dx'd 06/15/2016   Rectal incontinence    Stroke ([place]) 2014   hx of mini stroke    TIA (transient ischemic attack) 2015    Past Surgical History:  Procedure Laterality Date   CARDIOVERSION N/A 06/29/2016   Procedure: CARDIOVERSION;  Surgeon: Jerline Pain, MD;  Location: [place] [facility];  Service: Cardiovascular;  Laterality: N/A;   CATARACT EXTRACTION W/ INTRAOCULAR LENS IMPLANT Left    DILATION AND CURETTAGE OF UTERUS     hiatel hernia  08/20/2016   HYSTEROSCOPY WITH D & C  03/16/1999   INSERTION OF MESH N/A 08/20/2016   Procedure: INSERTION OF MESH;  Surgeon: Ralene Ok, MD;  Location:  [facility];  Service: General;  Laterality: N/A;   TEE WITHOUT CARDIOVERSION N/A 11/06/2012   Procedure: TRANSESOPHAGEAL ECHOCARDIOGRAM (TEE);  Surgeon: Lelon Perla, MD;  Location: [facility]COPY;  Service: Cardiovascular;  Laterality: N/A;   TONSILLECTOMY  1937   VAGINAL HYSTERECTOMY  09/2005   Anterior repair; Removal of urethral caruncle./notes 09/02/2015      reports that she has never smoked. She has never used smokeless tobacco. She reports current alcohol use of about 1.0 standard drink of alcohol per week. She reports that she does not use drugs. Social History   Socioeconomic History   Marital status: Widowed    Spouse name: Not on file   Number of children: 2   Years of education: Not on file   Highest education level: Not on file  Occupational History   Not on file  Tobacco Use   Smoking status: Never   Smokeless tobacco: Never   Tobacco comments:    Never smoke 10/01/21  Vaping Use   Vaping Use: Never used  Substance and Sexual Activity   Alcohol use: Yes    Alcohol/week: 1.0 standard drink of alcohol    Types: 1 Glasses of wine per week    Comment: one glass of wine once a month 10/01/21   Drug use: No   Sexual activity: Not Currently  Other Topics Concern   Not on file  Social History Narrative   Patient is an independent resident at [employer] and lives by herself-no pets.   Past profession was a Agricultural engineer, Network engineer, and Writer.   Exercise by walking everyday.    Patient has a living will. POA and DNR.    Social Determinants of Health   Financial Resource Strain: Low Risk  (12/14/2020)   Overall Financial Resource Strain (CARDIA)    Difficulty of Paying Living Expenses: Not hard at all  Food Insecurity: No Food Insecurity (12/14/2020)   Hunger Vital Sign    Worried About Running Out of Food in the Last Year: Never true    [place] in the Last Year: Never true  Transportation Needs: No Transportation Needs (12/14/2020)   PRAPARE - Armed forces logistics/support/administrative officer (Medical): No    Lack of Transportation (Non-Medical): No  Physical Activity: Insufficiently Active (12/14/2020)   Exercise Vital Sign    Days of Exercise per Week: 7 days    Minutes of Exercise per Session: 10 min  Stress: Stress Concern Present (12/14/2020)   [place]    Feeling of Stress : To some extent  Social Connections: Moderately Isolated (12/14/2020)   Social Connection and Isolation Panel [NHANES]    Frequency of Communication with Friends and Family: Three times a week    Frequency  of Social Gatherings with Friends and Family: Three times a week    Attends Religious Services: More than 4 times per year    Active Member of Clubs or Organizations: No    Attends Archivist Meetings: Never    Marital Status: Widowed  Intimate Partner Violence: Not At Risk (12/14/2020)   Humiliation, Afraid, Rape, and Kick questionnaire    Fear of Current or Ex-Partner: No    Emotionally Abused: No    Physically Abused: No    Sexually Abused: No   Functional Status Survey:    Allergies  Allergen Reactions   Lactose Other (See Comments)    Dairy Sensitivity  Dairy Sensitivity     Lisinopril Other (See Comments)    DIZZY   Bactrim [Sulfamethoxazole-Trimethoprim] Rash    Pertinent  Health Maintenance Due  Topic Date Due   INFLUENZA VACCINE  Completed   DEXA SCAN  Completed   MAMMOGRAM  Discontinued    Medications: Outpatient Encounter Medications as of 02/22/2022  Medication Sig   acetaminophen (TYLENOL) 500 MG tablet Take 500 mg by mouth every 6 (six) hours as needed. And one tablet by mouth once a morning.   ALPRAZolam (XANAX) 0.25 MG tablet Take 1 tablet (0.25 mg total) by mouth 2 (two) times daily as needed for anxiety.   amiodarone (PACERONE) 200 MG tablet Take 1 tablet (200 mg total) by mouth daily.   CALCIUM CARBONATE-VIT D-MIN PO Take 2 tablets by mouth 3 (three) times a week.     CRANBERRY PO Take 1 tablet by mouth daily.   ELIQUIS 5 MG TABS tablet TAKE 1 TABLET BY MOUTH TWICE DAILY   estradiol (ESTRACE) 0.1 MG/GM vaginal cream Place 1 Applicatorful vaginally 3 (three) times a week.   Eyelid Cleansers (OCUSOFT EYELID CLEANSING EX) Apply 1 application  topically as needed (dry eyes).   fluticasone (FLONASE) 50 MCG/ACT nasal spray Place 2 sprays into both nostrils daily. X 2 weeks   Glucosamine-Chondroitin (COSAMIN DS PO) Take 1 tablet by mouth 2 (two) times daily.   loperamide (IMODIUM A-D) 2 MG tablet Take 2 mg by mouth as needed for diarrhea or loose stools.   loratadine (CLARITIN) 10 MG tablet Take 10 mg by mouth daily as needed for allergies.    metoprolol tartrate (LOPRESSOR) 25 MG tablet Take 0.5 tablets (12.5 mg total) by mouth 2 (two) times daily. Hold if SBP < 110   nitrofurantoin, macrocrystal-monohydrate, (MACROBID) 100 MG capsule Take 100 mg by mouth at bedtime.   ondansetron (ZOFRAN) 4 MG tablet Take 1 tablet (4 mg total) by mouth every 8 (eight) hours as needed for nausea or vomiting.   Polyethyl Glycol-Propyl Glycol (SYSTANE OP) Place 1 drop into both eyes 2 (two) times daily as needed (for dry eyes).    pravastatin (PRAVACHOL) 40 MG tablet TAKE 1 TABLET(40 MG) BY MOUTH DAILY   [DISCONTINUED] Probiotic Product (PROBIOTIC DAILY) CAPS Take 2 capsules by mouth daily.   No facility-administered encounter medications on file as of 02/22/2022.    Review of Systems  Vitals:   02/22/22 1049  BP: (!) 151/63  Pulse: (!) 55  Temp: 98.4 F (36.9 C)  SpO2: 96%  Weight: 146 lb 3.2 oz (66.3 kg)  Height: '5\' 6"'$  (1.676 m)   Body mass index is 23.6 kg/m. Physical Exam Vitals reviewed.  Constitutional:      Appearance: Normal appearance.  HENT:     Head: Normocephalic.     Nose: Nose normal.     Mouth/Throat:  Mouth: Mucous membranes are moist.     Pharynx: Oropharynx is clear.  Eyes:     Pupils: Pupils are equal, round, and reactive to light.   Cardiovascular:     Rate and Rhythm: Normal rate and regular rhythm.     Pulses: Normal pulses.     Heart sounds: Normal heart sounds. No murmur heard. Pulmonary:     Effort: Pulmonary effort is normal.     Breath sounds: Normal breath sounds.  Abdominal:     General: Abdomen is flat. Bowel sounds are normal.     Palpations: Abdomen is soft.  Musculoskeletal:        General: No swelling.     Cervical back: Neck supple.  Skin:    General: Skin is warm.  Neurological:     General: No focal deficit present.     Mental Status: She is alert.  Psychiatric:        Mood and Affect: Mood normal.        Thought Content: Thought content normal.     Labs reviewed: Basic Metabolic Panel: Recent Labs    01/24/22 1146 01/25/22 0020 01/26/22 0105 02/01/22 0000  NA 141 141 139 144  K 3.8 3.9 3.8 4.3  CL 107 107 106 107  CO2 '26 23 24 '$ 23*  GLUCOSE 102* 97 107*  --   BUN '20 21 13 '$ 23*  CREATININE 0.74 0.81 0.65 0.7  CALCIUM 9.1 8.9 8.7* 9.5  MG 2.0 1.9 2.0  --    Liver Function Tests: Recent Labs    01/19/22 1946  AST 30  ALT 18  ALKPHOS 61  BILITOT 0.6  PROT 8.5*  ALBUMIN 4.6   No results for input(s): "LIPASE", "AMYLASE" in the last 8760 hours. No results for input(s): "AMMONIA" in the last 8760 hours. CBC: Recent Labs    01/19/22 1946 01/21/22 0018 01/22/22 0545 01/23/22 0038  WBC 8.9 13.1* 8.6 8.7  NEUTROABS 6.4  --   --   --   HGB 15.9* 13.7 12.7 12.3  HCT 47.7* 41.1 38.5 37.9  MCV 92.4 90.5 91.4 92.0  PLT 263 264 229 269   Cardiac Enzymes: No results for input(s): "CKTOTAL", "CKMB", "CKMBINDEX", "TROPONINI" in the last 8760 hours. BNP: Invalid input(s): "POCBNP" CBG: No results for input(s): "GLUCAP" in the last 8760 hours.  Procedures and Imaging Studies During Stay: No results found.  Assessment/Plan:   1. Paroxysmal atrial fibrillation (HCC) On Eliquis and now amiodarone Discontinue Lopressor 2. Essential hypertension, benign She is off  Amlodipine  Will probably will have to restart if BP continue to run high  3. Anxiety with Insomnia For now will continue PRN xanax DO not want to add anything new   4. Hyperlipidemia with target LDL less than 130 On statin  5 Recurent UTI On Macrodantin 6 MCI Patient does have some signs of cognitive impairment Patient is family is hiring caregivers 2 times a week and the daughter will help with med management  Patient is being discharged with the following home health services:  [facility]  Patient is being discharged with the following durable medical equipment:  Walker  Patient has been advised to f/u with their PCP in 1-2 weeks to for a transitions of care visit.  Social services at their facility was responsible for arranging this appointment.  Pt was provided with adequate prescriptions of noncontrolled medications to reach the scheduled appointment .  For controlled substances, a limited supply was provided as appropriate for the individual  patient.  If the pt normally receives these medications from a pain clinic or has a contract with another physician, these medications should be received from that clinic or physician only).    Future labs/tests needed:

## 2022-03-02 ENCOUNTER — Encounter: Payer: Medicare Other | Admitting: Internal Medicine

## 2022-03-03 ENCOUNTER — Encounter: Payer: Medicare Other | Admitting: Internal Medicine

## 2022-03-09 ENCOUNTER — Non-Acute Institutional Stay: Payer: Medicare Other | Admitting: Internal Medicine

## 2022-03-09 ENCOUNTER — Encounter: Payer: Self-pay | Admitting: Internal Medicine

## 2022-03-09 VITALS — BP 152/94 | HR 71 | Temp 97.7°F | Resp 17 | Ht 66.0 in | Wt 146.2 lb

## 2022-03-09 DIAGNOSIS — F419 Anxiety disorder, unspecified: Secondary | ICD-10-CM

## 2022-03-09 DIAGNOSIS — I480 Paroxysmal atrial fibrillation: Secondary | ICD-10-CM | POA: Diagnosis not present

## 2022-03-09 DIAGNOSIS — E785 Hyperlipidemia, unspecified: Secondary | ICD-10-CM | POA: Diagnosis not present

## 2022-03-09 DIAGNOSIS — I10 Essential (primary) hypertension: Secondary | ICD-10-CM

## 2022-03-09 DIAGNOSIS — G3184 Mild cognitive impairment, so stated: Secondary | ICD-10-CM

## 2022-03-09 DIAGNOSIS — T50B95A Adverse effect of other viral vaccines, initial encounter: Secondary | ICD-10-CM

## 2022-03-09 DIAGNOSIS — N390 Urinary tract infection, site not specified: Secondary | ICD-10-CM

## 2022-03-09 NOTE — Progress Notes (Signed)
Location: [place] of Service:  Clinic (12)  Provider:   Code Status:  Goals of Care:     01/26/2022    3:23 PM  Advanced Directives  Does Patient Have a Medical Advance Directive? Yes  Type of Advance Directive Out of facility DNR (pink MOST or yellow form)  Does patient want to make changes to medical advance directive? No - Patient declined     Chief Complaint  Patient presents with   Medical Management of Chronic Issues    4 month follow up   Quality Metric Gaps    Discussed the need for Awv and Covid vaccine     HPI: Patient is a 88 y.o. female seen today for an acute visit for Follow up after discharge from Rehab to her [place] with her daughter  Patient admitted in hospital from 10/03-10/10 for A Fib with RVR    Patient has a history of PAF on Eliquis, recurrent UTI and is on Macrobid, HLD, hypertension, IBS with diarrhea, anxiety.   On Amiodarone now Staying in Sinus Rhythm on Amiodarone Not using Lopressor now  Managing in Apartment with help of her daughter and Caregivers Daughter thinks patient is having more Anxiety and possible depression She is staying more in her room and gets nevous very easily Patient is more anxious because she says she had more help in rehab. But she denies any depression Does have some cognition issue. But staying independent in her ADLs with daughter helping with med management. No falls.  Walking with her walker  Past Medical History:  Diagnosis Date   Allergic rhinitis    Arthritis    "hands, little in my feet" (06/22/2016)   Candidiasis of vulva and vagina    Cystocele, midline    Disorder of bone and cartilage, unspecified    Fibroids    Hypertension    Large hiatal hernia 06/23/2016   Migraine    "none in the last few years" (06/22/2016)   Osteopenia    Other chronic cystitis    Other sign and symptom in breast    Paroxysmal atrial fibrillation ([place]) 11/30/2012   Pneumonia     "I've had walking pneumonia a couple times"  (06/22/2016)   Pneumonia dx'd 06/15/2016   Rectal incontinence    Stroke ([place]) 2014   hx of mini stroke    TIA (transient ischemic attack) 2015    Past Surgical History:  Procedure Laterality Date   CARDIOVERSION N/A 06/29/2016   Procedure: CARDIOVERSION;  Surgeon: Jerline Pain, MD;  Location: [place];  Service: Cardiovascular;  Laterality: N/A;   CATARACT EXTRACTION W/ INTRAOCULAR LENS IMPLANT Left    DILATION AND CURETTAGE OF UTERUS     hiatel hernia  08/20/2016   HYSTEROSCOPY WITH D & C  03/16/1999   INSERTION OF MESH N/A 08/20/2016   Procedure: INSERTION OF MESH;  Surgeon: Ralene Ok, MD;  Location: [facility];  Service: General;  Laterality: N/A;   TEE WITHOUT CARDIOVERSION N/A 11/06/2012   Procedure: TRANSESOPHAGEAL ECHOCARDIOGRAM (TEE);  Surgeon: Lelon Perla, MD;  [facility]: [facility] END[facility]COPY;  Service: Cardiovascular;  Laterality: N/A;   TONSILLECTOMY  1937   VAGINAL HYSTERECTOMY  09/2005   Anterior repair; Removal of urethral caruncle./notes 09/02/2015    Allergies  Allergen Reactions   Lactose Other (See Comments)    Dairy Sensitivity  Dairy Sensitivity     Lisinopril Other (See Comments)    DIZZY   Bactrim [Sulfamethoxazole-Trimethoprim] Rash  Outpatient Encounter Medications as of 03/09/2022  Medication Sig   acetaminophen (TYLENOL) 500 MG tablet Take 500 mg by mouth every 6 (six) hours as needed. And one tablet by mouth once a morning.   ALPRAZolam (XANAX) 0.25 MG tablet Take 1 tablet (0.25 mg total) by mouth 2 (two) times daily as needed for anxiety.   amiodarone (PACERONE) 200 MG tablet Take 1 tablet (200 mg total) by mouth daily.   CALCIUM CARBONATE-VIT D-MIN PO Take 2 tablets by mouth 3 (three) times a week.    CRANBERRY PO Take 1 tablet by mouth daily.   ELIQUIS 5 MG TABS tablet TAKE 1 TABLET BY MOUTH TWICE DAILY   estradiol (ESTRACE) 0.1 MG/GM vaginal cream Place 1 Applicatorful vaginally 3 (three) times a week.    Eyelid Cleansers (OCUSOFT EYELID CLEANSING EX) Apply 1 application  topically as needed (dry eyes).   fluticasone (FLONASE) 50 MCG/ACT nasal spray Place 2 sprays into both nostrils daily. X 2 weeks   Glucosamine-Chondroitin (COSAMIN DS PO) Take 1 tablet by mouth 2 (two) times daily.   loperamide (IMODIUM A-D) 2 MG tablet Take 2 mg by mouth as needed for diarrhea or loose stools.   loratadine (CLARITIN) 10 MG tablet Take 10 mg by mouth daily as needed for allergies.    nitrofurantoin, macrocrystal-monohydrate, (MACROBID) 100 MG capsule Take 100 mg by mouth at bedtime.   ondansetron (ZOFRAN) 4 MG tablet Take 1 tablet (4 mg total) by mouth every 8 (eight) hours as needed for nausea or vomiting.   Polyethyl Glycol-Propyl Glycol (SYSTANE OP) Place 1 drop into both eyes 2 (two) times daily as needed (for dry eyes).    pravastatin (PRAVACHOL) 40 MG tablet TAKE 1 TABLET(40 MG) BY MOUTH DAILY   No facility-administered encounter medications on file as of 03/09/2022.    Review of Systems:  Review of Systems  Constitutional:  Negative for activity change and appetite change.  HENT: Negative.    Respiratory:  Negative for cough and shortness of breath.   Cardiovascular:  Negative for leg swelling.  Gastrointestinal:  Negative for constipation.  Genitourinary: Negative.   Musculoskeletal:  Negative for arthralgias, gait problem and myalgias.  Skin: Negative.   Neurological:  Negative for dizziness and weakness.  Psychiatric/Behavioral:  Negative for confusion, dysphoric mood and sleep disturbance. The patient is nervous/anxious.     Health Maintenance  Topic Date Due   Medicare Annual Wellness (AWV)  12/14/2021   COVID-19 Vaccine (5 - 2023-24 season) 12/18/2021   Pneumonia Vaccine 82+ Years old  Completed   INFLUENZA VACCINE  Completed   DEXA SCAN  Completed   Zoster Vaccines- Shingrix  Completed   HPV VACCINES  Aged Out   MAMMOGRAM  Discontinued    Physical Exam: Vitals:   03/09/22  1020  BP: (!) 152/94  Pulse: 71  Resp: 17  Temp: 97.7 F (36.5 C)  SpO2: 94%  Weight: 146 lb 3.2 oz (66.3 kg)  Height: '5\' 6"'$  (1.676 m)   Body mass index is 23.6 kg/m. Physical Exam Vitals reviewed.  Constitutional:      Appearance: Normal appearance.  HENT:     Head: Normocephalic.     Nose: Nose normal.     Mouth/Throat:     Mouth: Mucous membranes are moist.     Pharynx: Oropharynx is clear.  Eyes:     Pupils: Pupils are equal, round, and reactive to light.  Cardiovascular:     Rate and Rhythm: Normal rate and regular rhythm.  Pulses: Normal pulses.     Heart sounds: Normal heart sounds. No murmur heard. Pulmonary:     Effort: Pulmonary effort is normal.     Breath sounds: Normal breath sounds.  Abdominal:     General: Abdomen is flat. Bowel sounds are normal.     Palpations: Abdomen is soft.  Musculoskeletal:        General: No swelling.     Cervical back: Neck supple.  Skin:    General: Skin is warm.  Neurological:     General: No focal deficit present.     Mental Status: She is alert.  Psychiatric:        Mood and Affect: Mood normal.        Thought Content: Thought content normal.     Labs reviewed: Basic Metabolic Panel: Recent Labs    01/19/22 1846 01/19/22 1946 01/24/22 1146 01/25/22 0020 01/26/22 0105 02/01/22 0000  NA  --    < > 141 141 139 144  K  --    < > 3.8 3.9 3.8 4.3  CL  --    < > 107 107 106 107  CO2  --    < > '26 23 24 '$ 23*  GLUCOSE  --    < > 102* 97 107*  --   BUN  --    < > '20 21 13 '$ 23*  CREATININE  --    < > 0.74 0.81 0.65 0.7  CALCIUM  --    < > 9.1 8.9 8.7* 9.5  MG  --    < > 2.0 1.9 2.0  --   TSH 2.428  --   --   --   --   --    < > = values in this interval not displayed.   Liver Function Tests: Recent Labs    01/19/22 1946  AST 30  ALT 18  ALKPHOS 61  BILITOT 0.6  PROT 8.5*  ALBUMIN 4.6   No results for input(s): "LIPASE", "AMYLASE" in the last 8760 hours. No results for input(s): "AMMONIA" in the last  8760 hours. CBC: Recent Labs    01/19/22 1946 01/21/22 0018 01/22/22 0545 01/23/22 0038  WBC 8.9 13.1* 8.6 8.7  NEUTROABS 6.4  --   --   --   HGB 15.9* 13.7 12.7 12.3  HCT 47.7* 41.1 38.5 37.9  MCV 92.4 90.5 91.4 92.0  PLT 263 264 229 269   Lipid Panel: No results for input(s): "CHOL", "HDL", "LDLCALC", "TRIG", "CHOLHDL", "LDLDIRECT" in the last 8760 hours. Lab Results  Component Value Date   HGBA1C 5.9 (A) 12/04/2019    Procedures since last visit: No results found.  Assessment/Plan 1. Paroxysmal atrial fibrillation (HCC) Doing well on Amiodarone Also oN Eliquis Will Follow with Cardiology  2. Essential hypertension, benign BP mildly high Was taken off Norvasc due to Low BPS  Will follow   3. Anxiety Discussed about starting antidepressant.  With patient wants to wait right now.  She said that she needs some time to adjust from rehab. Will continue Xanax as needed Come for follow-up to decide.  4. Hyperlipidemia with target LDL less than 130 On statin Needs Lipid Follow up  5. Recurrent UTI Macrobid  6. Mild cognitive impairment MMSE in Rehab was 19/30 1/3 in recall Managing well with the help of daughter and caregivers. Open to  AL if needed  7. Adverse effect of COVID-19 vaccine Patient states that she had a bad side effect due to COVID  wants to know my opinion.  We discussed both pros and cons of taking the vaccination. She is going to think about it.    Labs/tests ordered:  * No order type specified * Next appt:  05/11/2022

## 2022-03-16 ENCOUNTER — Telehealth: Payer: Self-pay

## 2022-03-16 MED ORDER — SERTRALINE HCL 25 MG PO TABS: 25.0000 mg | ORAL_TABLET | Freq: Every day | ORAL | 3 refills | Status: DC

## 2022-03-16 NOTE — Telephone Encounter (Signed)
Let them know that I have called in Zoloft /Sertraline .  I will check her BMP in 2 weeks WS will call her for appointment

## 2022-03-16 NOTE — Telephone Encounter (Signed)
Patient's daughter was advised and verbalized understanding.

## 2022-03-16 NOTE — Addendum Note (Signed)
Addended by: Georgina Snell on: 03/16/2022 03:32 PM   Modules accepted: Orders

## 2022-03-16 NOTE — Telephone Encounter (Signed)
Patient's daughter called requesting medication for anxiety to be send in for the patient. She reports the patient had increased anxiety and they could not wait until January for anything as discussed with Dr. Veleta Miners. Daughter stated that the medication can be send to [facility] in chart.

## 2022-03-26 ENCOUNTER — Telehealth: Payer: Self-pay | Admitting: Adult Health

## 2022-03-26 ENCOUNTER — Other Ambulatory Visit: Payer: Self-pay | Admitting: Adult Health

## 2022-03-26 DIAGNOSIS — N3000 Acute cystitis without hematuria: Secondary | ICD-10-CM

## 2022-03-26 MED ORDER — CEPHALEXIN 500 MG PO CAPS: 500.0000 mg | ORAL_CAPSULE | Freq: Four times a day (QID) | ORAL | 0 refills | Status: AC

## 2022-03-26 MED ORDER — CEPHALEXIN 500 MG PO CAPS: 500.0000 mg | ORAL_CAPSULE | Freq: Four times a day (QID) | ORAL | 0 refills | Status: DC

## 2022-03-26 NOTE — Telephone Encounter (Signed)
Nurse at [facility] reported urinary symptoms and family member of Ms. Tischer concerned for UTI. UA obtained. Initial results show TNTC bacteria, 500 leuk esterase, 151 WBC.  Will start Keflex, follow culture.

## 2022-03-29 ENCOUNTER — Telehealth: Payer: Self-pay | Admitting: Adult Health

## 2022-03-29 NOTE — Telephone Encounter (Signed)
Received call from resident's son [name]. He expressed concern that his mother was placed on Keflex for a UTI as she has had UTIs with drug resistant organisms in the past and cephalosporins were not effective. Her final urine culture grew <10,000 colonies indicating no UTI. The specimen did have epithelial cells and was a clean catch. I had the nurse go check on [name]. She did not have urinary symptoms last week but did have mild leg weakness. This has improved at this time. The patient does not feel she needs an apt and seems to be doing better. No changes were made today. If she has further issues we can recollect the urine specimen.

## 2022-04-28 ENCOUNTER — Other Ambulatory Visit: Payer: Medicare Other

## 2022-05-11 ENCOUNTER — Non-Acute Institutional Stay: Payer: Medicare Other | Admitting: Internal Medicine

## 2022-05-11 ENCOUNTER — Encounter: Payer: Self-pay | Admitting: Internal Medicine

## 2022-05-11 VITALS — BP 148/72 | HR 63 | Temp 98.1°F | Resp 18 | Ht 66.0 in | Wt 143.0 lb

## 2022-05-11 DIAGNOSIS — I10 Essential (primary) hypertension: Secondary | ICD-10-CM

## 2022-05-11 DIAGNOSIS — I480 Paroxysmal atrial fibrillation: Secondary | ICD-10-CM

## 2022-05-11 DIAGNOSIS — R630 Anorexia: Secondary | ICD-10-CM

## 2022-05-11 DIAGNOSIS — F419 Anxiety disorder, unspecified: Secondary | ICD-10-CM

## 2022-05-11 DIAGNOSIS — E785 Hyperlipidemia, unspecified: Secondary | ICD-10-CM

## 2022-05-11 DIAGNOSIS — R0602 Shortness of breath: Secondary | ICD-10-CM | POA: Diagnosis not present

## 2022-05-11 DIAGNOSIS — G9332 Myalgic encephalomyelitis/chronic fatigue syndrome: Secondary | ICD-10-CM

## 2022-05-11 DIAGNOSIS — G3184 Mild cognitive impairment, so stated: Secondary | ICD-10-CM

## 2022-05-11 DIAGNOSIS — U099 Post covid-19 condition, unspecified: Secondary | ICD-10-CM

## 2022-05-11 DIAGNOSIS — N390 Urinary tract infection, site not specified: Secondary | ICD-10-CM

## 2022-05-11 NOTE — Progress Notes (Unsigned)
Location: [place] of Service:  Clinic (12)  Provider:   Code Status: *** Goals of Care:     05/11/2022    1:13 PM  Advanced Directives  Does Patient Have a Medical Advance Directive? Yes  Type of Paramedic of [place];Living will;Out of facility DNR (pink MOST or yellow form)  Does patient want to make changes to medical advance directive? No - Patient declined     Chief Complaint  Patient presents with   Medical Management of Chronic Issues    Follow up. Patient states she would like to discuss some symptoms after covid congestion and weakness    HPI: Patient is a 89 y.o. female seen today for an acute visit for  Past Medical History:  Diagnosis Date   Allergic rhinitis    Arthritis    "hands, little in my feet" (06/22/2016)   Candidiasis of vulva and vagina    Cystocele, midline    Disorder of bone and cartilage, unspecified    Fibroids    Hypertension    Large hiatal hernia 06/23/2016   Migraine    "none in the last few years" (06/22/2016)   Osteopenia    Other chronic cystitis    Other sign and symptom in breast    Paroxysmal atrial fibrillation ([place]) 11/30/2012   Pneumonia    "I've had walking pneumonia a couple times"  (06/22/2016)   Pneumonia dx'd 06/15/2016   Rectal incontinence    Stroke ([place]) 2014   hx of mini stroke    TIA (transient ischemic attack) 2015    Past Surgical History:  Procedure Laterality Date   CARDIOVERSION N/A 06/29/2016   Procedure: CARDIOVERSION;  Surgeon: Jerline Pain, MD;  Location: [place];  Service: Cardiovascular;  Laterality: N/A;   CATARACT EXTRACTION W/ INTRAOCULAR LENS IMPLANT Left    DILATION AND CURETTAGE OF UTERUS     hiatel hernia  08/20/2016   HYSTEROSCOPY WITH D & C  03/16/1999   INSERTION OF MESH N/A 08/20/2016   Procedure: INSERTION OF MESH;  Surgeon: Ralene Ok, MD;  Location: [facility];  Service: General;  Laterality: N/A;   TEE WITHOUT CARDIOVERSION  N/A 11/06/2012   Procedure: TRANSESOPHAGEAL ECHOCARDIOGRAM (TEE);  Surgeon: Lelon Perla, MD;  Location: [facility] ENDOSCOPY;  Service: Cardiovascular;  Laterality: N/A;   TONSILLECTOMY  1937   VAGINAL HYSTERECTOMY  09/2005   Anterior repair; Removal of urethral caruncle./notes 09/02/2015    Allergies  Allergen Reactions   Lactose Other (See Comments)    Dairy Sensitivity  Dairy Sensitivity     Lisinopril Other (See Comments)    DIZZY   Bactrim [Sulfamethoxazole-Trimethoprim] Rash    Outpatient Encounter Medications as of 05/11/2022  Medication Sig   acetaminophen (TYLENOL) 500 MG tablet Take 500 mg by mouth every 6 (six) hours as needed. And one tablet by mouth once a morning.   ALPRAZolam (XANAX) 0.25 MG tablet Take 1 tablet (0.25 mg total) by mouth 2 (two) times daily as needed for anxiety.   amiodarone (PACERONE) 200 MG tablet Take 1 tablet (200 mg total) by mouth daily.   CALCIUM CARBONATE-VIT D-MIN PO Take 2 tablets by mouth 3 (three) times a week.    CRANBERRY PO Take 1 tablet by mouth daily.   ELIQUIS 5 MG TABS tablet TAKE 1 TABLET BY MOUTH TWICE DAILY   estradiol (ESTRACE) 0.1 MG/GM vaginal cream Place 1 Applicatorful vaginally 3 (three) times a week.   Eyelid Cleansers (OCUSOFT EYELID  CLEANSING EX) Apply 1 application  topically as needed (dry eyes).   fluticasone (FLONASE) 50 MCG/ACT nasal spray Place 2 sprays into both nostrils daily. X 2 weeks   Glucosamine-Chondroitin (COSAMIN DS PO) Take 1 tablet by mouth 2 (two) times daily.   loperamide (IMODIUM A-D) 2 MG tablet Take 2 mg by mouth as needed for diarrhea or loose stools.   loratadine (CLARITIN) 10 MG tablet Take 10 mg by mouth daily as needed for allergies.    nitrofurantoin, macrocrystal-monohydrate, (MACROBID) 100 MG capsule Take 100 mg by mouth at bedtime.   ondansetron (ZOFRAN) 4 MG tablet Take 1 tablet (4 mg total) by mouth every 8 (eight) hours as needed for nausea or vomiting.   Polyethyl Glycol-Propyl Glycol  (SYSTANE OP) Place 1 drop into both eyes 2 (two) times daily as needed (for dry eyes).    pravastatin (PRAVACHOL) 40 MG tablet TAKE 1 TABLET(40 MG) BY MOUTH DAILY   sertraline (ZOLOFT) 25 MG tablet Take 1 tablet (25 mg total) by mouth daily.   No facility-administered encounter medications on file as of 05/11/2022.    Review of Systems:  Review of Systems  Health Maintenance  Topic Date Due   Medicare Annual Wellness (AWV)  12/14/2021   COVID-19 Vaccine (5 - 2023-24 season) 12/18/2021   DTaP/Tdap/Td (5 - Td or Tdap) 02/19/2026   Pneumonia Vaccine 61+ Years old  Completed   INFLUENZA VACCINE  Completed   DEXA SCAN  Completed   Zoster Vaccines- Shingrix  Completed   HPV VACCINES  Aged Out   MAMMOGRAM  Discontinued    Physical Exam: Vitals:   05/11/22 1310  BP: (!) 148/72  Pulse: 63  Resp: 18  Temp: 98.1 F (36.7 C)  TempSrc: Temporal  SpO2: 98%  Weight: 143 lb (64.9 kg)  Height: '5\' 6"'$  (1.676 m)   Body mass index is 23.08 kg/m. Physical Exam  Labs reviewed: Basic Metabolic Panel: Recent Labs    01/19/22 1846 01/19/22 1946 01/24/22 1146 01/25/22 0020 01/26/22 0105 02/01/22 0000  NA  --    < > 141 141 139 144  K  --    < > 3.8 3.9 3.8 4.3  CL  --    < > 107 107 106 107  CO2  --    < > '26 23 24 '$ 23*  GLUCOSE  --    < > 102* 97 107*  --   BUN  --    < > '20 21 13 '$ 23*  CREATININE  --    < > 0.74 0.81 0.65 0.7  CALCIUM  --    < > 9.1 8.9 8.7* 9.5  MG  --    < > 2.0 1.9 2.0  --   TSH 2.428  --   --   --   --   --    < > = values in this interval not displayed.   Liver Function Tests: Recent Labs    01/19/22 1946  AST 30  ALT 18  ALKPHOS 61  BILITOT 0.6  PROT 8.5*  ALBUMIN 4.6   No results for input(s): "LIPASE", "AMYLASE" in the last 8760 hours. No results for input(s): "AMMONIA" in the last 8760 hours. CBC: Recent Labs    01/19/22 1946 01/21/22 0018 01/22/22 0545 01/23/22 0038  WBC 8.9 13.1* 8.6 8.7  NEUTROABS 6.4  --   --   --   HGB 15.9* 13.7  12.7 12.3  HCT 47.7* 41.1 38.5 37.9  MCV 92.4 90.5 91.4  92.0  PLT 263 264 229 269   Lipid Panel: No results for input(s): "CHOL", "HDL", "LDLCALC", "TRIG", "CHOLHDL", "LDLDIRECT" in the last 8760 hours. Lab Results  Component Value Date   HGBA1C 5.9 (A) 12/04/2019    Procedures since last visit: No results found.  Assessment/Plan There are no diagnoses linked to this encounter.   Labs/tests ordered:  * No order type specified * Next appt:  Visit date not found

## 2022-05-20 ENCOUNTER — Encounter (HOSPITAL_COMMUNITY): Payer: Self-pay | Admitting: Physician Assistant

## 2022-05-20 ENCOUNTER — Ambulatory Visit (HOSPITAL_COMMUNITY)
Admission: RE | Admit: 2022-05-20 | Discharge: 2022-05-20 | Disposition: A | Discharge status: Home / Self Care | Payer: Medicare Other | Source: Ambulatory Visit | Attending: Physician Assistant | Admitting: Physician Assistant

## 2022-05-20 VITALS — BP 124/60 | HR 70 | Ht 66.0 in | Wt 140.0 lb

## 2022-05-20 DIAGNOSIS — Z8249 Family history of ischemic heart disease and other diseases of the circulatory system: Secondary | ICD-10-CM | POA: Diagnosis not present

## 2022-05-20 DIAGNOSIS — D6869 Other thrombophilia: Secondary | ICD-10-CM | POA: Diagnosis not present

## 2022-05-20 DIAGNOSIS — Z8673 Personal history of transient ischemic attack (TIA), and cerebral infarction without residual deficits: Secondary | ICD-10-CM | POA: Insufficient documentation

## 2022-05-20 DIAGNOSIS — I10 Essential (primary) hypertension: Secondary | ICD-10-CM | POA: Diagnosis not present

## 2022-05-20 DIAGNOSIS — I480 Paroxysmal atrial fibrillation: Secondary | ICD-10-CM | POA: Insufficient documentation

## 2022-05-20 DIAGNOSIS — Z7901 Long term (current) use of anticoagulants: Secondary | ICD-10-CM | POA: Diagnosis not present

## 2022-05-20 DIAGNOSIS — Z79899 Other long term (current) drug therapy: Secondary | ICD-10-CM | POA: Insufficient documentation

## 2022-05-20 LAB — COMPREHENSIVE METABOLIC PANEL
ALT: 15 U/L (ref 0–44)
AST: 24 U/L (ref 15–41)
Albumin: 3.6 g/dL (ref 3.5–5.0)
Alkaline Phosphatase: 43 U/L (ref 38–126)
Anion gap: 8 (ref 5–15)
BUN: 17 mg/dL (ref 8–23)
CO2: 26 mmol/L (ref 22–32)
Calcium: 8.9 mg/dL (ref 8.9–10.3)
Chloride: 102 mmol/L (ref 98–111)
Creatinine, Ser: 0.99 mg/dL (ref 0.44–1.00)
GFR, Estimated: 55 mL/min — ABNORMAL LOW (ref >60)
Glucose, Bld: 116 mg/dL — ABNORMAL HIGH (ref 70–99)
Potassium: 3.9 mmol/L (ref 3.5–5.1)
Sodium: 136 mmol/L (ref 135–145)
Total Bilirubin: 0.5 mg/dL (ref 0.3–1.2)
Total Protein: 6.7 g/dL (ref 6.5–8.1)

## 2022-05-20 LAB — TSH: TSH: 3.663 u[IU]/mL (ref 0.350–4.500)

## 2022-05-20 NOTE — Progress Notes (Signed)
Primary Care Physician: [name], MD Referring Physician: NL  Cardiologist: Dr. [name]    [name] is a 89 y.o. female with a h/o paroxysmal afib, prior TIA, HTN who presents for follow up in the [facility]. She is on apixaban for a CHA2DS2VASc score of 6. Patient reports that overall her afib has been well controlled. She will occasionally take a PRN diltiazem which quickly resolves her palpitations.  On 08/21/20 she had an episode of dizziness and almost fell. She did not lose consciousness. She went to the ED and evaluation at that time was unremarkable. She was in SR. Vertigo was suspected and she was started on meclizine.   Patient was admitted 01/19/22 with generalized weakness. She was found to be in afib with RVR in the setting of a UTI and dehydration. She also had orthostatic hypotension and her amlodipine was discontinued at discharge. She spontaneously converted to SR with treatment of her UTI. Patient did have an additional episode of atrial flutter on 01/27/22, identified at her visit with Windell Moulding NP. Started on amiodarone 02/02/22.   On follow up today, patient reports that she has done well from a cardiac standpoint. She had COVID around [place] and is slowly regaining her strength. She denies any symptoms of atrial fibrillation. No bleeding issues on anticoagulation.   Today, she denies symptoms of palpitations, chest pain, shortness of breath, orthopnea, PND, lower extremity edema, presyncope, syncope, or neurologic sequela. The patient is tolerating medications without difficulties and is otherwise without complaint today.   Past Medical History:  Diagnosis Date   Allergic rhinitis    Arthritis    "hands, little in my feet" (06/22/2016)   Candidiasis of vulva and vagina    Cystocele, midline    Disorder of bone and cartilage, unspecified    Fibroids    Hypertension    Large hiatal hernia 06/23/2016   Migraine    "none in the  last few years" (06/22/2016)   Osteopenia    Other chronic cystitis    Other sign and symptom in breast    Paroxysmal atrial fibrillation ([place]) 11/30/2012   Pneumonia    "I've had walking pneumonia a couple times"  (06/22/2016)   Pneumonia dx'd 06/15/2016   Rectal incontinence    Stroke ([place]) 2014   hx of mini stroke    TIA (transient ischemic attack) 2015   Past Surgical History:  Procedure Laterality Date   CARDIOVERSION N/A 06/29/2016   Procedure: CARDIOVERSION;  Surgeon: Jerline Pain, MD;  Location: [place];  Service: Cardiovascular;  Laterality: N/A;   CATARACT EXTRACTION W/ INTRAOCULAR LENS IMPLANT Left    DILATION AND CURETTAGE OF UTERUS     hiatel hernia  08/20/2016   HYSTEROSCOPY WITH D & C  03/16/1999   INSERTION OF MESH N/A 08/20/2016   Procedure: INSERTION OF MESH;  Surgeon: Ralene Ok, MD;  Location: [facility];  Service: General;  Laterality: N/A;   TEE WITHOUT CARDIOVERSION N/A 11/06/2012   Procedure: TRANSESOPHAGEAL ECHOCARDIOGRAM (TEE);  Surgeon: Lelon Perla, MD;  Location: [facility] END[facility]COPY;  Service: Cardiovascular;  Laterality: N/A;   TONSILLECTOMY  1937   VAGINAL HYSTERECTOMY  09/2005   Anterior repair; Removal of urethral caruncle./notes 09/02/2015    Current Outpatient Medications  Medication Sig Dispense Refill   acetaminophen (TYLENOL) 500 MG tablet Take 500 mg by mouth every 6 (six) hours as needed. And one tablet by mouth once a morning.  ALPRAZolam (XANAX) 0.25 MG tablet Take 1 tablet (0.25 mg total) by mouth 2 (two) times daily as needed for anxiety. 4 tablet 0   amiodarone (PACERONE) 200 MG tablet Take 1 tablet (200 mg total) by mouth daily. 60 tablet 3   CALCIUM CARBONATE-VIT D-MIN PO Take 2 tablets by mouth 3 (three) times a week.      CRANBERRY PO Take 1 tablet by mouth daily.     ELIQUIS 5 MG TABS tablet TAKE 1 TABLET BY MOUTH TWICE DAILY 180 tablet 1   estradiol (ESTRACE) 0.1 MG/GM vaginal cream Place 1 Applicatorful vaginally 3 (three) times a  week.     Eyelid Cleansers (OCUSOFT EYELID CLEANSING EX) Apply 1 application  topically as needed (dry eyes).     fluticasone (FLONASE) 50 MCG/ACT nasal spray Place 2 sprays into both nostrils daily. X 2 weeks     Glucosamine-Chondroitin (COSAMIN DS PO) Take 1 tablet by mouth 2 (two) times daily.     loperamide (IMODIUM A-D) 2 MG tablet Take 2 mg by mouth as needed for diarrhea or loose stools.     loratadine (CLARITIN) 10 MG tablet Take 10 mg by mouth daily as needed for allergies.      nitrofurantoin, macrocrystal-monohydrate, (MACROBID) 100 MG capsule Take 100 mg by mouth at bedtime.     ondansetron (ZOFRAN) 4 MG tablet Take 1 tablet (4 mg total) by mouth every 8 (eight) hours as needed for nausea or vomiting. 20 tablet 0   Polyethyl Glycol-Propyl Glycol (SYSTANE OP) Place 1 drop into both eyes 2 (two) times daily as needed (for dry eyes).      pravastatin (PRAVACHOL) 40 MG tablet TAKE 1 TABLET(40 MG) BY MOUTH DAILY 90 tablet 1   sertraline (ZOLOFT) 25 MG tablet Take 1 tablet (25 mg total) by mouth daily. 30 tablet 3   No current facility-administered medications for this encounter.    Allergies  Allergen Reactions   Lactose Other (See Comments)    Dairy Sensitivity  Dairy Sensitivity     Lisinopril Other (See Comments)    DIZZY   Bactrim [Sulfamethoxazole-Trimethoprim] Rash    Social History   Socioeconomic History   Marital status: Widowed    Spouse name: Not on file   Number of children: 2   Years of education: Not on file   Highest education level: Not on file  Occupational History   Not on file  Tobacco Use   Smoking status: Never   Smokeless tobacco: Never   Tobacco comments:    Never smoke 10/01/21  Vaping Use   Vaping Use: Never used  Substance and Sexual Activity   Alcohol use: Yes    Alcohol/week: 1.0 standard drink of alcohol    Types: 1 Glasses of wine per week    Comment: one glass of wine once a month 10/01/21   Drug use: No   Sexual activity: Not  Currently  Other Topics Concern   Not on file  Social History Narrative   Patient is an independent resident at [employer] [facility] and lives by herself-no pets.   Past profession was a Agricultural engineer, Network engineer, and Writer.   Exercise by walking everyday.    Patient has a living will. POA and DNR.    Social Determinants of Health   Financial Resource Strain: Low Risk  (12/14/2020)   Overall Financial Resource Strain (CARDIA)    Difficulty of Paying Living Expenses: Not hard at all  Food Insecurity: No Food Insecurity (12/14/2020)   Hunger Vital Sign  Worried About Charity fundraiser in the Last Year: Never true    Marie in the Last Year: Never true  Transportation Needs: No Transportation Needs (12/14/2020)   PRAPARE - Hydrologist (Medical): No    Lack of Transportation (Non-Medical): No  Physical Activity: Insufficiently Active (12/14/2020)   Exercise Vital Sign    Days of Exercise per Week: 7 days    Minutes of Exercise per Session: 10 min  Stress: Stress Concern Present (12/14/2020)   [place]    Feeling of Stress : To some extent  Social Connections: Moderately Isolated (12/14/2020)   Social Connection and Isolation Panel [NHANES]    Frequency of Communication with Friends and Family: Three times a week    Frequency of Social Gatherings with Friends and Family: Three times a week    Attends Religious Services: More than 4 times per year    Active Member of Clubs or Organizations: No    Attends Archivist Meetings: Never    Marital Status: Widowed  Intimate Partner Violence: Not At Risk (12/14/2020)   Humiliation, Afraid, Rape, and Kick questionnaire    Fear of Current or Ex-Partner: No    Emotionally Abused: No    Physically Abused: No    Sexually Abused: No    Family History  Problem Relation Age of Onset   Cancer Mother    Hypertension Mother    Heart disease  Father    Heart disease Brother    Emphysema Brother     ROS- All systems are reviewed and negative except as per the HPI above  Physical Exam: Vitals:   05/20/22 1326  BP: 124/60  Pulse: 70  Weight: 63.5 kg  Height: '5\' 6"'$  (1.676 m)     Wt Readings from Last 3 Encounters:  05/20/22 63.5 kg  05/11/22 64.9 kg  03/09/22 66.3 kg    Labs: Lab Results  Component Value Date   NA 144 02/01/2022   K 4.3 02/01/2022   CL 107 02/01/2022   CO2 23 (A) 02/01/2022   GLUCOSE 107 (H) 01/26/2022   BUN 23 (A) 02/01/2022   CREATININE 0.7 02/01/2022   CALCIUM 9.5 02/01/2022   MG 2.0 01/26/2022   Lab Results  Component Value Date   INR 1.5 (H) 12/27/2019   Lab Results  Component Value Date   CHOL 155 04/24/2018   HDL 50 04/24/2018   LDLCALC 71 04/24/2018   TRIG 171 (H) 04/24/2018    GEN- The patient is a well appearing elderly female, alert and oriented x 3 today.   HEENT-head normocephalic, atraumatic, sclera clear, conjunctiva pink, hearing intact, trachea midline. Lungs- Clear to ausculation bilaterally, normal work of breathing Heart- Regular rate and rhythm, no murmurs, rubs or gallops  GI- soft, NT, ND, + BS Extremities- no clubbing, cyanosis, or edema MS- no significant deformity or atrophy Skin- no rash or lesion Psych- euthymic mood, full affect Neuro- strength and sensation are intact   EKG-  SR, PVC Vent. rate 70 BPM PR interval 200 ms QRS duration 82 ms QT/QTcB 458/494 ms  CHA2DS2-VASc Score = 6  The patient's score is based upon: CHF History: 0 HTN History: 1 Diabetes History: 0 Stroke History: 2 Vascular Disease History: 0 Age Score: 2 Gender Score: 1       ASSESSMENT AND PLAN: 1. Paroxysmal Atrial Fibrillation (ICD10:  I48.0) The patient's CHA2DS2-VASc score is 6, indicating a  9.7% annual risk of stroke.   Patient appears to be maintaining SR. Continue amiodarone 200 mg daily. Can consider decreasing this to 100 mg daily if she continues to  maintain SR. Check cmet/TSH today. Continue Eliquis 5 mg BID (weight > 60 kg, Cr < 1.5) Continue diltiazem 30 mg PRN for heart racing. Continue Lopressor 12.5 mg BID   2. Secondary Hypercoagulable State (ICD10:  D68.69) The patient is at significant risk for stroke/thromboembolism based upon her CHA2DS2-VASc Score of 6.  Continue Apixaban (Eliquis).   3. HTN Stable, no changes today.   Follow up with Dr [name] as scheduled. AF clinic in 6 months.    [facility] [address] [place], [place] [address] [phone]

## 2022-05-27 ENCOUNTER — Encounter (HOSPITAL_COMMUNITY): Payer: Self-pay | Admitting: *Deleted

## 2022-05-31 ENCOUNTER — Other Ambulatory Visit: Payer: Self-pay

## 2022-05-31 ENCOUNTER — Other Ambulatory Visit: Payer: Self-pay | Admitting: Adult Health

## 2022-05-31 DIAGNOSIS — F419 Anxiety disorder, unspecified: Secondary | ICD-10-CM

## 2022-05-31 LAB — BASIC METABOLIC PANEL
BUN: 14 (ref 4–21)
CO2: 24 — AB (ref 13–22)
Chloride: 105 (ref 99–108)
Creatinine: 0.8 (ref 0.5–1.1)
Glucose: 105
Potassium: 4.3 mEq/L (ref 3.5–5.1)
Sodium: 142 (ref 137–147)

## 2022-05-31 LAB — LIPID PANEL
Cholesterol: 147 (ref 0–200)
HDL: 62 (ref 35–70)
LDL Cholesterol: 62
LDl/HDL Ratio: 2.4
Triglycerides: 115 (ref 40–160)

## 2022-05-31 LAB — COMPREHENSIVE METABOLIC PANEL
Calcium: 9.6 (ref 8.7–10.7)
eGFR: 68

## 2022-05-31 MED ORDER — SERTRALINE HCL 25 MG PO TABS: 25.0000 mg | ORAL_TABLET | Freq: Every day | ORAL | 1 refills | Status: DC

## 2022-05-31 MED ORDER — ALPRAZOLAM 0.25 MG PO TABS: 0.2500 mg | ORAL_TABLET | Freq: Two times a day (BID) | ORAL | 5 refills | Status: DC | PRN

## 2022-05-31 NOTE — Telephone Encounter (Signed)
Patients daughter called on her behalf requesting a refill on Xanax and Zoloft to the [facility] in [place] sent to pharmacy. I will send rx request for xanax to patients PCP since it is a controlled substance and requires approval from the prescriber.   Patient is requesting a refill of the following medications: Requested Prescriptions   Pending Prescriptions Disp Refills   ALPRAZolam (XANAX) 0.25 MG tablet 60 tablet 5    Sig: Take 1 tablet (0.25 mg total) by mouth 2 (two) times daily as needed for anxiety.   Signed Prescriptions Disp Refills   sertraline (ZOLOFT) 25 MG tablet 90 tablet 1    Sig: Take 1 tablet (25 mg total) by mouth daily.    Authorizing Provider: [name]    Ordering User: Logan Bores    Date of last refill Duanne Moron): 01/26/2022 by Dr.Patel   Refill amount: 4 pills   Treatment agreement date: Not on file, notation made on pending appointment for 06/28/2022

## 2022-06-12 ENCOUNTER — Other Ambulatory Visit: Payer: Self-pay | Admitting: Cardiology

## 2022-06-14 NOTE — Telephone Encounter (Signed)
Prescription refill request for Eliquis received. Indication: afib  Last office visit: [name], 01/01/2022 Scr: 0.99, 05/20/2022 Age: 89 yo  Weight: 63.5 kg

## 2022-06-28 ENCOUNTER — Encounter: Payer: Self-pay | Admitting: Adult Health

## 2022-06-28 ENCOUNTER — Non-Acute Institutional Stay: Payer: Medicare Other | Admitting: Adult Health

## 2022-06-28 VITALS — BP 152/70 | HR 60 | Temp 98.0°F | Resp 16 | Ht 66.0 in | Wt 141.8 lb

## 2022-06-28 DIAGNOSIS — F411 Generalized anxiety disorder: Secondary | ICD-10-CM | POA: Diagnosis not present

## 2022-06-28 DIAGNOSIS — J00 Acute nasopharyngitis [common cold]: Secondary | ICD-10-CM

## 2022-06-28 MED ORDER — SERTRALINE HCL 50 MG PO TABS: 50.0000 mg | ORAL_TABLET | Freq: Every day | ORAL | 2 refills | Status: DC

## 2022-06-28 NOTE — Patient Instructions (Addendum)
Can take Claritin daily for two weeks to help with phlegm  We will check sodium prior to apt.

## 2022-06-28 NOTE — Progress Notes (Signed)
Location:  [facility]  POS: Clinic  Provider: Royal Hawthorn, ANP  Code Status:  Goals of Care:     06/28/2022    1:47 PM  Advanced Directives  Does Patient Have a Medical Advance Directive? Yes  Type of Paramedic of [place];Living will;Out of facility DNR (pink MOST or yellow form)  Does patient want to make changes to medical advance directive? No - Patient declined     Chief Complaint  Patient presents with   Medical Management of Chronic Issues    6 week follow up nd patient has trouble with alllergies   Quality Metric Gaps    Discussed the need for AWV    HPI: Patient is a 89 y.o. female seen today for f/u regarding anxiety and weakness.  Had Covid in Jan 2024. Afterward had some weakness and fatigue. Things are getting better now. Continues to have a post nasal drip. No sob.  Feeling stronger, uses walker sometimes [employer] 1/[employer] mechanical. Hit the right side of her face. Saw the nurse at [facility] had a small laceration.  Her husband died 3 years ago married for over 69 years.  Feels shaky in the morning. Feels anxious at times. Speech rapid. Started on zoloft 25 mg each day. Does not think it helped. She is still taking xanax as needed for feelings of anxiety. Her daughter handles her meds and fills her pill boxes.    Past Medical History:  Diagnosis Date   Allergic rhinitis    Arthritis    "hands, little in my feet" (06/22/2016)   Candidiasis of vulva and vagina    Cystocele, midline    Disorder of bone and cartilage, unspecified    Fibroids    Hypertension    Large hiatal hernia 06/23/2016   Migraine    "none in the last few years" (06/22/2016)   Osteopenia    Other chronic cystitis    Other sign and symptom in breast    Paroxysmal atrial fibrillation ([place]) 11/30/2012   Pneumonia    "I've had walking pneumonia a couple times"  (06/22/2016)   Pneumonia dx'd 06/15/2016   Rectal incontinence    Stroke ([place]) 2014   hx of mini stroke     TIA (transient ischemic attack) 2015    Past Surgical History:  Procedure Laterality Date   CARDIOVERSION N/A 06/29/2016   Procedure: CARDIOVERSION;  Surgeon: Jerline Pain, MD;  Location: [facility];  Service: Cardiovascular;  Laterality: N/A;   CATARACT EXTRACTION W/ INTRAOCULAR LENS IMPLANT Left    DILATION AND CURETTAGE OF UTERUS     hiatel hernia  08/20/2016   HYSTEROSCOPY WITH D & C  03/16/1999   INSERTION OF MESH N/A 08/20/2016   Procedure: INSERTION OF MESH;  Surgeon: Ralene Ok, MD;  Location: [facility];  Service: General;  Laterality: N/A;   TEE WITHOUT CARDIOVERSION N/A 11/06/2012   Procedure: TRANSESOPHAGEAL ECHOCARDIOGRAM (TEE);  Surgeon: Lelon Perla, MD;  Location: [facility] END[facility]COPY;  Service: Cardiovascular;  Laterality: N/A;   TONSILLECTOMY  1937   VAGINAL HYSTERECTOMY  09/2005   Anterior repair; Removal of urethral caruncle./notes 09/02/2015    Allergies  Allergen Reactions   Lactose Other (See Comments)    Dairy Sensitivity  Dairy Sensitivity     Lisinopril Other (See Comments)    DIZZY   Bactrim [Sulfamethoxazole-Trimethoprim] Rash    Outpatient Encounter Medications as of 06/28/2022  Medication Sig   acetaminophen (TYLENOL) 500 MG tablet Take 500 mg by mouth every 6 (six)  hours as needed. And one tablet by mouth once a morning.   ALPRAZolam (XANAX) 0.25 MG tablet Take 1 tablet (0.25 mg total) by mouth 2 (two) times daily as needed for anxiety.   amiodarone (PACERONE) 200 MG tablet Take 1 tablet (200 mg total) by mouth daily.   CALCIUM CARBONATE-VIT D-MIN PO Take 2 tablets by mouth 3 (three) times a week.    ELIQUIS 5 MG TABS tablet TAKE 1 TABLET BY MOUTH TWICE DAILY   estradiol (ESTRACE) 0.1 MG/GM vaginal cream Place 1 Applicatorful vaginally 3 (three) times a week.   Eyelid Cleansers (OCUSOFT EYELID CLEANSING EX) Apply 1 application  topically as needed (dry eyes).   fluticasone (FLONASE) 50 MCG/ACT nasal spray Place 2 sprays into both nostrils daily. X 2  weeks   Glucosamine-Chondroitin (COSAMIN DS PO) Take 1 tablet by mouth 2 (two) times daily.   loperamide (IMODIUM A-D) 2 MG tablet Take 2 mg by mouth as needed for diarrhea or loose stools.   loratadine (CLARITIN) 10 MG tablet Take 10 mg by mouth daily as needed for allergies.    nitrofurantoin, macrocrystal-monohydrate, (MACROBID) 100 MG capsule Take 100 mg by mouth at bedtime.   ondansetron (ZOFRAN) 4 MG tablet Take 1 tablet (4 mg total) by mouth every 8 (eight) hours as needed for nausea or vomiting.   Polyethyl Glycol-Propyl Glycol (SYSTANE OP) Place 1 drop into both eyes 2 (two) times daily as needed (for dry eyes).    pravastatin (PRAVACHOL) 40 MG tablet TAKE 1 TABLET(40 MG) BY MOUTH DAILY   sertraline (ZOLOFT) 25 MG tablet Take 1 tablet (25 mg total) by mouth daily.   CRANBERRY PO Take 1 tablet by mouth daily. (Patient not taking: Reported on 06/28/2022)   No facility-administered encounter medications on file as of 06/28/2022.    Review of Systems:  Review of Systems  Constitutional:  Negative for activity change, appetite change, chills, diaphoresis, fatigue, fever and unexpected weight change.  HENT:  Positive for congestion and postnasal drip.   Respiratory:  Negative for cough, shortness of breath and wheezing.   Cardiovascular:  Negative for chest pain, palpitations and leg swelling.  Gastrointestinal:  Negative for abdominal distention, abdominal pain, constipation and diarrhea.  Genitourinary:  Negative for difficulty urinating and dysuria.  Musculoskeletal:  Positive for gait problem (sometimes use walker.). Negative for arthralgias, back pain, joint swelling and myalgias.  Neurological:  Negative for dizziness, tremors, seizures, syncope, facial asymmetry, speech difficulty, weakness, light-headedness, numbness and headaches.  Psychiatric/Behavioral:  Positive for dysphoric mood. Negative for agitation, behavioral problems, confusion, self-injury and sleep disturbance. The  patient is nervous/anxious.        Memory loss    Health Maintenance  Topic Date Due   Medicare Annual Wellness (AWV)  12/14/2021   COVID-19 Vaccine (5 - 2023-24 season) 12/18/2021   DTaP/Tdap/Td (5 - Td or Tdap) 02/19/2026   Pneumonia Vaccine 55+ Years old  Completed   INFLUENZA VACCINE  Completed   DEXA SCAN  Completed   Zoster Vaccines- Shingrix  Completed   HPV VACCINES  Aged Out   MAMMOGRAM  Discontinued    Physical Exam: Vitals:   06/28/22 1349  BP: (!) 152/70  Pulse: 60  Resp: 16  Temp: 98 F (36.7 C)  TempSrc: Temporal  SpO2: 96%  Weight: 141 lb 12.8 oz (64.3 kg)  Height: '5\' 6"'$  (1.676 m)   Body mass index is 22.89 kg/m. Physical Exam Vitals and nursing note reviewed.  Constitutional:      General:  She is not in acute distress.    Appearance: She is not diaphoretic.  HENT:     Head: Normocephalic and atraumatic.  Neck:     Vascular: No JVD.  Cardiovascular:     Rate and Rhythm: Normal rate and regular rhythm.     Heart sounds: No murmur heard. Pulmonary:     Effort: Pulmonary effort is normal. No respiratory distress.     Breath sounds: Normal breath sounds. No wheezing.  Skin:    General: Skin is warm and dry.  Neurological:     Mental Status: She is alert and oriented to person, place, and time.     Labs reviewed: Basic Metabolic Panel: Recent Labs    01/19/22 1846 01/19/22 1946 01/24/22 1146 01/25/22 0020 01/26/22 0105 02/01/22 0000 05/20/22 1356 05/20/22 1430 05/31/22 0000  NA  --    < > 141 141 139 144 136  --  142  K  --    < > 3.8 3.9 3.8 4.3 3.9  --  4.3  CL  --    < > 107 107 106 107 102  --  105  CO2  --    < > '26 23 24 '$ 23* 26  --  24*  GLUCOSE  --    < > 102* 97 107*  --  116*  --   --   BUN  --    < > '20 21 13 '$ 23* 17  --  14  CREATININE  --    < > 0.74 0.81 0.65 0.7 0.99  --  0.8  CALCIUM  --    < > 9.1 8.9 8.7* 9.5 8.9  --  9.6  MG  --    < > 2.0 1.9 2.0  --   --   --   --   TSH 2.428  --   --   --   --   --   --  3.663   --    < > = values in this interval not displayed.   Liver Function Tests: Recent Labs    01/19/22 1946 05/20/22 1356  AST 30 24  ALT 18 15  ALKPHOS 61 43  BILITOT 0.6 0.5  PROT 8.5* 6.7  ALBUMIN 4.6 3.6   No results for input(s): "LIPASE", "AMYLASE" in the last 8760 hours. No results for input(s): "AMMONIA" in the last 8760 hours. CBC: Recent Labs    01/19/22 1946 01/21/22 0018 01/22/22 0545 01/23/22 0038  WBC 8.9 13.1* 8.6 8.7  NEUTROABS 6.4  --   --   --   HGB 15.9* 13.7 12.7 12.3  HCT 47.7* 41.1 38.5 37.9  MCV 92.4 90.5 91.4 92.0  PLT 263 264 229 269   Lipid Panel: Recent Labs    05/31/22 0000  CHOL 147  HDL 62  LDLCALC 62  TRIG 115   Lab Results  Component Value Date   HGBA1C 5.9 (A) 12/04/2019    Procedures since last visit: No results found.  Assessment/Plan  1. Generalized anxiety disorder Continues to have symptoms Will increase zoloft to 50  mg qhs F/U apt in 6 weeks with BMP before to check sodium.   2. Acute rhinitis After covid Try claritin 10 mg daily for 2 weeks.   Labs/tests ordered:  * No order type specified * BMP prior to apt Next appt:  6 weeks    Total time 79mn:  time greater than 50% of total time spent doing pt counseling and coordination of  care

## 2022-07-22 NOTE — Progress Notes (Signed)
[name] Date of Birth: 07/27/1930 Medical Record #[id number]  History of Present Illness: [name] is seen for followup for history of TIA and paroxysmal atrial fibrillation.  She is on anticoagulation with Eliquis.  She is on metoprolol and cardizem for rate control and prior event monitor showed good control when she is in Afib.  She  was admitted in March 2018 with nausea vomiting and the chest pain. Ultrasound of abdomen does not show any gallstones. During the hospitalization, she developed worsening shortness of breath after aggressive IV hydration. She was treated with IV Lasix afterward with good urinary output. She was also found to be hypothyroid and received thyroid replacement. Her hospital stay was complicated by atrial fibrillation with RVR. She was  on eliquis. Troponin was elevated and a BNP was 836. Cardiology was consulted for atrial fibrillation and elevated troponin. Elevated troponin was felt to be related to demand ischemia with heart failure and the pneumonia. Echocardiogram obtained on 06/28/2016 showed EF 50-55%, grade 1 diastolic dysfunction. She required cardioversion on 06/29/2016 but was unable to maintain NSR.   On 08/21/16 she was readmitted and underwent Nissen fundoplication without complication.   Last summer she was seen in the Afib clinic with Afib. She was treated with long acting Cardizem as well as 30 mg po prn. She states this has helped. She is seen today with her son who [place] an [place]. She still has Afib. Not really sustained. Has a Cadionet monitor but doesn't like to use it.    Patient was admitted 01/19/22 with generalized weakness. She was found to be in afib with RVR in the setting of a UTI and dehydration. She also had orthostatic hypotension and her amlodipine was discontinued at discharge. She spontaneously converted to SR with treatment of her UTI. Patient did have an additional episode of atrial flutter on 01/27/22, identified  at her visit with Hazle NordmannAmy Fargo NP. Started on amiodarone 02/02/22. She was seen in Afib clinic in Feb and maintained on Eliquis, amiodarone 200 mg daily. No longer on metoprolol.   She did have Covid in January. Has been very weak since then. Multiple falls. Doesn't pass out really- just legs give out. No change in vitals. Pulse has been regular. No increased dyspnea. Is now in Rehab at [facility]. Getting assessed for PT/OT. Appetite poor. Is using supplements. Weight is down. CMET and CBC yesterday OK. Daughter in law reports UA also checked.    Current Outpatient Medications on File Prior to Visit  Medication Sig Dispense Refill   acetaminophen (TYLENOL) 500 MG tablet Take 500 mg by mouth every 6 (six) hours as needed. And one tablet by mouth once a morning.     ALPRAZolam (XANAX) 0.25 MG tablet Take 1 tablet (0.25 mg total) by mouth 2 (two) times daily as needed for anxiety. 60 tablet 5   CALCIUM CARBONATE-VIT D-MIN PO Take 2 tablets by mouth 3 (three) times a week.      CRANBERRY PO Take 1 tablet by mouth daily.     ELIQUIS 5 MG TABS tablet TAKE 1 TABLET BY MOUTH TWICE DAILY 180 tablet 1   estradiol (ESTRACE) 0.1 MG/GM vaginal cream Place 1 Applicatorful vaginally 3 (three) times a week.     Eyelid Cleansers (OCUSOFT EYELID CLEANSING EX) Apply 1 application  topically as needed (dry eyes).     fluticasone (FLONASE) 50 MCG/ACT nasal spray Place 2 sprays into both nostrils daily. X 2 weeks     Glucosamine-Chondroitin (COSAMIN  DS PO) Take 1 tablet by mouth 2 (two) times daily.     loperamide (IMODIUM A-D) 2 MG tablet Take 2 mg by mouth as needed for diarrhea or loose stools.     loratadine (CLARITIN) 10 MG tablet Take 10 mg by mouth daily as needed for allergies.      nitrofurantoin, macrocrystal-monohydrate, (MACROBID) 100 MG capsule Take 100 mg by mouth at bedtime.     ondansetron (ZOFRAN) 4 MG tablet Take 1 tablet (4 mg total) by mouth every 8 (eight) hours as needed for nausea or vomiting. 20  tablet 0   Polyethyl Glycol-Propyl Glycol (SYSTANE OP) Place 1 drop into both eyes 2 (two) times daily as needed (for dry eyes).      pravastatin (PRAVACHOL) 40 MG tablet TAKE 1 TABLET(40 MG) BY MOUTH DAILY 90 tablet 1   sertraline (ZOLOFT) 50 MG tablet Take 1 tablet (50 mg total) by mouth daily. 30 tablet 2   No current facility-administered medications on file prior to visit.    Allergies  Allergen Reactions   Lactose Other (See Comments)    Dairy Sensitivity  Dairy Sensitivity     Lisinopril Other (See Comments)    DIZZY   Bactrim [Sulfamethoxazole-Trimethoprim] Rash    Past Medical History:  Diagnosis Date   Allergic rhinitis    Arthritis    "hands, little in my feet" (06/22/2016)   Candidiasis of vulva and vagina    Cystocele, midline    Disorder of bone and cartilage, unspecified    Fibroids    Hypertension    Large hiatal hernia 06/23/2016   Migraine    "none in the last few years" (06/22/2016)   Osteopenia    Other chronic cystitis    Other sign and symptom in breast    Paroxysmal atrial fibrillation 11/30/2012   Pneumonia    "I've had walking pneumonia a couple times"  (06/22/2016)   Pneumonia dx'd 06/15/2016   Rectal incontinence    Stroke 2014   hx of mini stroke    TIA (transient ischemic attack) 2015    Past Surgical History:  Procedure Laterality Date   CARDIOVERSION N/A 06/29/2016   Procedure: CARDIOVERSION;  Surgeon: Jake Bathe, MD;  Location: [facility] OR;  Service: Cardiovascular;  Laterality: N/A;   CATARACT EXTRACTION W/ INTRAOCULAR LENS IMPLANT Left    DILATION AND CURETTAGE OF UTERUS     hiatel hernia  08/20/2016   HYSTEROSCOPY WITH D & C  03/16/1999   INSERTION OF MESH N/A 08/20/2016   Procedure: INSERTION OF MESH;  Surgeon: Axel Filler, MD;  Location: [facility] ORS;  Service: General;  Laterality: N/A;   TEE WITHOUT CARDIOVERSION N/A 11/06/2012   Procedure: TRANSESOPHAGEAL ECHOCARDIOGRAM (TEE);  Surgeon: Lewayne Bunting, MD;  Location: [facility] ENDOSCOPY;   Service: Cardiovascular;  Laterality: N/A;   TONSILLECTOMY  1937   VAGINAL HYSTERECTOMY  09/2005   Anterior repair; Removal of urethral caruncle./notes 09/02/2015    Social History   Tobacco Use  Smoking Status Never  Smokeless Tobacco Never  Tobacco Comments   Never smoke 10/01/21    Social History   Substance and Sexual Activity  Alcohol Use Yes   Alcohol/week: 1.0 standard drink of alcohol   Types: 1 Glasses of wine per week   Comment: one glass of wine once a month 10/01/21    Family History  Problem Relation Age of Onset   Cancer Mother    Hypertension Mother    Heart disease Father    Heart disease  Brother    Emphysema Brother     Review of Systems: As noted in history of present illness. All other systems were reviewed and are negative.  Physical Exam: BP (!) 177/66   Pulse (!) 59   Ht 5\' 6"  (1.676 m)   Wt 136 lb 9.6 oz (62 kg)   SpO2 98%   BMI 22.05 kg/m  GENERAL:  chronically ill  appearing WF in NAD HEENT:  PERRL, EOMI, sclera are clear. Oropharynx is clear. NECK:  No jugular venous distention, carotid upstroke brisk and symmetric, no bruits, no thyromegaly or adenopathy LUNGS:  Clear to auscultation bilaterally CHEST:  Unremarkable HEART:  RRR,  PMI not displaced or sustained,S1 and S2 within normal limits, no S3, no S4: no clicks, no rubs, no murmurs ABD:  Soft, nontender. BS +, no masses or bruits. No hepatomegaly, no splenomegaly EXT:  2 + pulses throughout, no edema, no cyanosis no clubbing SKIN:  Warm and dry.  No rashes NEURO:  Alert and oriented x 3. Cranial nerves II through XII intact. PSYCH:  Cognitively intact      LABORATORY DATA: Lab Results  Component Value Date   WBC 8.7 01/23/2022   HGB 12.3 01/23/2022   HCT 37.9 01/23/2022   PLT 269 01/23/2022   GLUCOSE 116 (H) 05/20/2022   CHOL 147 05/31/2022   TRIG 115 05/31/2022   HDL 62 05/31/2022   LDLCALC 62 05/31/2022   ALT 15 05/20/2022   AST 24 05/20/2022   NA 142  05/31/2022   K 4.3 05/31/2022   CL 105 05/31/2022   CREATININE 0.8 05/31/2022   BUN 14 05/31/2022   CO2 24 (A) 05/31/2022   TSH 3.663 05/20/2022   INR 1.5 (H) 12/27/2019   HGBA1C 5.9 (A) 12/04/2019   Ecg today shows NSR rate 60 bpm. LAD, old anteroseptal infarct. QTc 520 msec. I have personally reviewed and interpreted this study.   Echo 07/04/16: Study Conclusions   - Left ventricle: The cavity size was normal. Wall thickness was   increased in a pattern of mild LVH. Systolic function was normal.   The estimated ejection fraction was in the range of 50% to 55%.   Wall motion was normal; there were no regional wall motion   abnormalities. Doppler parameters are consistent with abnormal   left ventricular relaxation (grade 1 diastolic dysfunction). - Mitral valve: Moderately calcified annulus. There was moderate   regurgitation. - Left atrium: The atrium was mildly dilated. - Right atrium: The atrium was mildly dilated.  Echo 01/20/22: IMPRESSIONS     1. Left ventricular ejection fraction, by estimation, is 50%. The left  ventricle has mildly decreased function. The left ventricle demonstrates  global hypokinesis. Left ventricular diastolic parameters are  indeterminate.   2. Right ventricular systolic function is normal. The right ventricular  size is normal. There is normal pulmonary artery systolic pressure. The  estimated right ventricular systolic pressure is 19.8 mmHg.   3. Left atrial size was mildly dilated.   4. The mitral valve is normal in structure. Mild mitral valve  regurgitation. No evidence of mitral stenosis. Moderate mitral annular  calcification.   5. The aortic valve is tricuspid. There is mild calcification of the  aortic valve. Aortic valve regurgitation is trivial. No aortic stenosis is  present.   6. Aortic dilatation noted. There is mild dilatation of the aortic root,  measuring 39 mm.   7. The inferior vena cava is normal in size with greater than  50%  respiratory variability, suggesting right atrial pressure of 3 mmHg.   8. The patient is in atrial fibrillation.     Assessment / Plan:  1. TIA. On long-term anticoagulation with Eliquis. Tolerating well.   2. Paroxysmal atrial fibrillation.  Maintaining NSR with amiodarone. No longer on metoprolol or diltiazem. Will reduce amiodarone to 100 mg daily. LFTs normal. TSH  in Feb Ok.  3. Weakness/falls. Suspect this is more related to Covid, deconditioning. Proceed with evaluation at Rehab for possible PT/OT. Maintain good nutrition. On no vasoactive meds currently.  4. Hyperlipidemia-on pravastatin with good results.  5. S/p Nissen fundoplication.   I will follow up in 4 months.

## 2022-07-26 ENCOUNTER — Non-Acute Institutional Stay: Payer: Medicare Other | Admitting: Adult Health

## 2022-07-26 ENCOUNTER — Encounter: Payer: Self-pay | Admitting: Adult Health

## 2022-07-26 VITALS — BP 160/68 | HR 59 | Temp 97.6°F | Resp 17 | Ht 66.0 in | Wt 137.8 lb

## 2022-07-26 DIAGNOSIS — I10 Essential (primary) hypertension: Secondary | ICD-10-CM

## 2022-07-26 DIAGNOSIS — I480 Paroxysmal atrial fibrillation: Secondary | ICD-10-CM

## 2022-07-26 DIAGNOSIS — R531 Weakness: Secondary | ICD-10-CM

## 2022-07-26 DIAGNOSIS — N390 Urinary tract infection, site not specified: Secondary | ICD-10-CM | POA: Diagnosis not present

## 2022-07-26 DIAGNOSIS — R55 Syncope and collapse: Secondary | ICD-10-CM

## 2022-07-26 DIAGNOSIS — G3184 Mild cognitive impairment, so stated: Secondary | ICD-10-CM

## 2022-07-26 NOTE — Progress Notes (Addendum)
Location:  [facility]  POS: Clinic  Provider: Fletcher Anon, ANP  Code Status: DNR Goals of Care:     07/26/2022    3:12 PM  Advanced Directives  Does Patient Have a Medical Advance Directive? Yes  Type of Estate agent of [place];Living will;Out of facility DNR (pink MOST or yellow form)  Does patient want to make changes to medical advance directive? No - Patient declined     Chief Complaint  Patient presents with   Acute Visit    Patient states she has been falling and fainting. More than 3-4 in the last 2 weeks     HPI: Patient is a 89 y.o. female seen today for medical management of chronic diseases.    PMH significant for MCI, HTN, Afib, HLD, nissen fundoplication, chronic leg edema, recurrent UTI, anxiety  She has had 4 falls since the last visit in March. 3 this past weekend.  One fall was a near passing out, others were more balance related per her daughter who is here for the apt. She did not hit her head. No injury reported.  Her son is a doctor and he saw her and said her vitals were ok and there was no afib. The son did not feels she needed to go to the ER. Due to weakness they are concerned she needs a higher level of care and additional work up  She has memory loss and has been progressing. Also has anxiety. On zoloft and dose increased one month ago.  She has lost 5 lbs in the past month, eating less, also  seems to have self care deficit. Her daughter manages her meds.  Past Medical History:  Diagnosis Date   Allergic rhinitis    Arthritis    "hands, little in my feet" (06/22/2016)   Candidiasis of vulva and vagina    Cystocele, midline    Disorder of bone and cartilage, unspecified    Fibroids    Hypertension    Large hiatal hernia 06/23/2016   Migraine    "none in the last few years" (06/22/2016)   Osteopenia    Other chronic cystitis    Other sign and symptom in breast    Paroxysmal atrial fibrillation 11/30/2012   Pneumonia     "I've had walking pneumonia a couple times"  (06/22/2016)   Pneumonia dx'd 06/15/2016   Rectal incontinence    Stroke 2014   hx of mini stroke    TIA (transient ischemic attack) 2015    Past Surgical History:  Procedure Laterality Date   CARDIOVERSION N/A 06/29/2016   Procedure: CARDIOVERSION;  Surgeon: Jake Bathe, MD;  Location: [facility] OR;  Service: Cardiovascular;  Laterality: N/A;   CATARACT EXTRACTION W/ INTRAOCULAR LENS IMPLANT Left    DILATION AND CURETTAGE OF UTERUS     hiatel hernia  08/20/2016   HYSTEROSCOPY WITH D & C  03/16/1999   INSERTION OF MESH N/A 08/20/2016   Procedure: INSERTION OF MESH;  Surgeon: Axel Filler, MD;  Location: [facility] ORS;  Service: General;  Laterality: N/A;   TEE WITHOUT CARDIOVERSION N/A 11/06/2012   Procedure: TRANSESOPHAGEAL ECHOCARDIOGRAM (TEE);  Surgeon: Lewayne Bunting, MD;  Location: [facility] ENDOSCOPY;  Service: Cardiovascular;  Laterality: N/A;   TONSILLECTOMY  1937   VAGINAL HYSTERECTOMY  09/2005   Anterior repair; Removal of urethral caruncle./notes 09/02/2015    Allergies  Allergen Reactions   Lactose Other (See Comments)    Dairy Sensitivity  Dairy Sensitivity     Lisinopril  Other (See Comments)    DIZZY   Bactrim [Sulfamethoxazole-Trimethoprim] Rash    Outpatient Encounter Medications as of 07/26/2022  Medication Sig   acetaminophen (TYLENOL) 500 MG tablet Take 500 mg by mouth every 6 (six) hours as needed. And one tablet by mouth once a morning.   ALPRAZolam (XANAX) 0.25 MG tablet Take 1 tablet (0.25 mg total) by mouth 2 (two) times daily as needed for anxiety.   amiodarone (PACERONE) 200 MG tablet Take 1 tablet (200 mg total) by mouth daily.   CALCIUM CARBONATE-VIT D-MIN PO Take 2 tablets by mouth 3 (three) times a week.    CRANBERRY PO Take 1 tablet by mouth daily.   ELIQUIS 5 MG TABS tablet TAKE 1 TABLET BY MOUTH TWICE DAILY   estradiol (ESTRACE) 0.1 MG/GM vaginal cream Place 1 Applicatorful vaginally 3 (three) times a week.    Eyelid Cleansers (OCUSOFT EYELID CLEANSING EX) Apply 1 application  topically as needed (dry eyes).   fluticasone (FLONASE) 50 MCG/ACT nasal spray Place 2 sprays into both nostrils daily. X 2 weeks   Glucosamine-Chondroitin (COSAMIN DS PO) Take 1 tablet by mouth 2 (two) times daily.   loperamide (IMODIUM A-D) 2 MG tablet Take 2 mg by mouth as needed for diarrhea or loose stools.   loratadine (CLARITIN) 10 MG tablet Take 10 mg by mouth daily as needed for allergies.    nitrofurantoin, macrocrystal-monohydrate, (MACROBID) 100 MG capsule Take 100 mg by mouth at bedtime.   ondansetron (ZOFRAN) 4 MG tablet Take 1 tablet (4 mg total) by mouth every 8 (eight) hours as needed for nausea or vomiting.   Polyethyl Glycol-Propyl Glycol (SYSTANE OP) Place 1 drop into both eyes 2 (two) times daily as needed (for dry eyes).    pravastatin (PRAVACHOL) 40 MG tablet TAKE 1 TABLET(40 MG) BY MOUTH DAILY   sertraline (ZOLOFT) 50 MG tablet Take 1 tablet (50 mg total) by mouth daily.   No facility-administered encounter medications on file as of 07/26/2022.    Review of Systems:  Review of Systems  Constitutional:  Positive for activity change, appetite change and unexpected weight change. Negative for chills, diaphoresis, fatigue and fever.  HENT:  Negative for congestion.   Respiratory:  Negative for cough, shortness of breath and wheezing.   Cardiovascular:  Positive for leg swelling. Negative for chest pain and palpitations.  Gastrointestinal:  Negative for abdominal distention, abdominal pain, constipation and diarrhea.  Genitourinary:  Negative for difficulty urinating and dysuria.  Musculoskeletal:  Positive for gait problem. Negative for arthralgias, back pain, joint swelling and myalgias.  Neurological:  Negative for dizziness, tremors, seizures, syncope, facial asymmetry, speech difficulty, weakness, light-headedness, numbness and headaches.  Psychiatric/Behavioral:  Negative for agitation, behavioral  problems and confusion. The patient is nervous/anxious.        Memory loss    Health Maintenance  Topic Date Due   COVID-19 Vaccine (5 - 2023-24 season) 08/11/2022 (Originally 12/18/2021)   Medicare Annual Wellness (AWV)  08/25/2022 (Originally 12/14/2021)   INFLUENZA VACCINE  11/18/2022   DTaP/Tdap/Td (5 - Td or Tdap) 02/19/2026   Pneumonia Vaccine 865+ Years old  Completed   DEXA SCAN  Completed   Zoster Vaccines- Shingrix  Completed   HPV VACCINES  Aged Out   MAMMOGRAM  Discontinued    Physical Exam: Vitals:   07/26/22 1516 07/26/22 1517  BP: (!) 162/68 (!) 160/68  Pulse: (!) 59   Resp: 17   Temp: 97.6 F (36.4 C)   TempSrc: Temporal   SpO2:  97%   Weight: 137 lb 12.8 oz (62.5 kg)   Height: 5\' 6"  (1.676 m)    Body mass index is 22.24 kg/m. Physical Exam Vitals and nursing note reviewed.  Constitutional:      General: She is not in acute distress.    Appearance: She is not diaphoretic.  HENT:     Head: Normocephalic and atraumatic.     Right Ear: Tympanic membrane normal.     Left Ear: Tympanic membrane normal.     Nose: Nose normal.     Mouth/Throat:     Mouth: Mucous membranes are moist.     Pharynx: Oropharynx is clear.  Eyes:     Extraocular Movements: Extraocular movements intact.     Conjunctiva/sclera: Conjunctivae normal.     Pupils: Pupils are equal, round, and reactive to light.  Neck:     Vascular: No JVD.  Cardiovascular:     Rate and Rhythm: Normal rate.     Heart sounds: No murmur heard.    Comments: Regular with skipped beats Pulmonary:     Effort: Pulmonary effort is normal. No respiratory distress.     Breath sounds: No wheezing.     Comments: Decreased breath sounds Abdominal:     General: Bowel sounds are normal. There is no distension.     Palpations: Abdomen is soft.     Tenderness: There is no abdominal tenderness.  Musculoskeletal:        General: No swelling, tenderness, deformity or signs of injury.     Comments: Strength BUE  4/5 BLE 4/5 BLE edema +1  Skin:    General: Skin is warm and dry.  Neurological:     General: No focal deficit present.     Mental Status: She is alert and oriented to person, place, and time. Mental status is at baseline.     Cranial Nerves: No cranial nerve deficit.     Labs reviewed: Basic Metabolic Panel: Recent Labs    01/19/22 1846 01/19/22 1946 01/24/22 1146 01/25/22 0020 01/26/22 0105 02/01/22 0000 05/20/22 1356 05/20/22 1430 05/31/22 0000  NA  --    < > 141 141 139 144 136  --  142  K  --    < > 3.8 3.9 3.8 4.3 3.9  --  4.3  CL  --    < > 107 107 106 107 102  --  105  CO2  --    < > 26 23 24  23* 26  --  24*  GLUCOSE  --    < > 102* 97 107*  --  116*  --   --   BUN  --    < > 20 21 13  23* 17  --  14  CREATININE  --    < > 0.74 0.81 0.65 0.7 0.99  --  0.8  CALCIUM  --    < > 9.1 8.9 8.7* 9.5 8.9  --  9.6  MG  --    < > 2.0 1.9 2.0  --   --   --   --   TSH 2.428  --   --   --   --   --   --  3.663  --    < > = values in this interval not displayed.   Liver Function Tests: Recent Labs    01/19/22 1946 05/20/22 1356  AST 30 24  ALT 18 15  ALKPHOS 61 43  BILITOT 0.6 0.5  PROT 8.5* 6.7  ALBUMIN 4.6 3.6   No results for input(s): "LIPASE", "AMYLASE" in the last 8760 hours. No results for input(s): "AMMONIA" in the last 8760 hours. CBC: Recent Labs    01/19/22 1946 01/21/22 0018 01/22/22 0545 01/23/22 0038  WBC 8.9 13.1* 8.6 8.7  NEUTROABS 6.4  --   --   --   HGB 15.9* 13.7 12.7 12.3  HCT 47.7* 41.1 38.5 37.9  MCV 92.4 90.5 91.4 92.0  PLT 263 264 229 269   Lipid Panel: Recent Labs    05/31/22 0000  CHOL 147  HDL 62  LDLCALC 62  TRIG 115   Lab Results  Component Value Date   HGBA1C 5.9 (A) 12/04/2019    Procedures since last visit: No results found.  Assessment/Plan  1. Generalized weakness Will order basic workup. Does have decreased breath sounds, general weakness, no urinary symptoms.  Does not appear toxic and is ok to admit to  skilled rehab and receive work up for increased weakness and falls. No signs of injury from her falls.    2. Recurrent UTI On macrobid   3. Mild cognitive impairment Progressing, would benefit from AL  4. Paroxysmal atrial fibrillation Followed by cardiology, on amiodarone On Eliquis for CVA risk reduction   5. Essential hypertension, benign Slightly elevated with hx of orthostatic bp  6. Presyncope Will check orthostatic bp/pulse  Check labs Going to cardiology in am ? EKG  She is concerned about the zoloft that was started causing weakness, will hold for now.   Labs/tests ordered:  * No order type specified *CBC CMP stat, CXR, UA C and S  Next appt:  08/10/2022   Total time  time greater than 50% of total time spent doing pt counseling and coordination of care

## 2022-07-27 ENCOUNTER — Ambulatory Visit: Payer: Medicare Other | Attending: Cardiology | Admitting: Cardiology

## 2022-07-27 ENCOUNTER — Non-Acute Institutional Stay (SKILLED_NURSING_FACILITY): Payer: Medicare Other | Admitting: Internal Medicine

## 2022-07-27 ENCOUNTER — Encounter: Payer: Self-pay | Admitting: Internal Medicine

## 2022-07-27 ENCOUNTER — Encounter: Payer: Self-pay | Admitting: Cardiology

## 2022-07-27 VITALS — BP 177/66 | HR 59 | Ht 66.0 in | Wt 136.6 lb

## 2022-07-27 DIAGNOSIS — R531 Weakness: Secondary | ICD-10-CM | POA: Diagnosis not present

## 2022-07-27 DIAGNOSIS — E785 Hyperlipidemia, unspecified: Secondary | ICD-10-CM

## 2022-07-27 DIAGNOSIS — I10 Essential (primary) hypertension: Secondary | ICD-10-CM | POA: Insufficient documentation

## 2022-07-27 DIAGNOSIS — D6869 Other thrombophilia: Secondary | ICD-10-CM | POA: Diagnosis present

## 2022-07-27 DIAGNOSIS — F411 Generalized anxiety disorder: Secondary | ICD-10-CM

## 2022-07-27 DIAGNOSIS — W19XXXD Unspecified fall, subsequent encounter: Secondary | ICD-10-CM | POA: Diagnosis not present

## 2022-07-27 DIAGNOSIS — N390 Urinary tract infection, site not specified: Secondary | ICD-10-CM

## 2022-07-27 DIAGNOSIS — G3184 Mild cognitive impairment, so stated: Secondary | ICD-10-CM

## 2022-07-27 DIAGNOSIS — R630 Anorexia: Secondary | ICD-10-CM

## 2022-07-27 DIAGNOSIS — I480 Paroxysmal atrial fibrillation: Secondary | ICD-10-CM

## 2022-07-27 MED ORDER — AMIODARONE HCL 200 MG PO TABS: ORAL_TABLET | ORAL | 3 refills | Status: DC

## 2022-07-27 MED ORDER — AMIODARONE HCL 200 MG PO TABS: 100.0000 mg | ORAL_TABLET | Freq: Every day | ORAL | 3 refills | Status: DC

## 2022-07-27 NOTE — Progress Notes (Unsigned)
Provider:   [facility] Room [facility] 156A Place of Service:  SNF (31)  PCP: Mahlon GammonGupta, Tyre Beaver L, MD Patient Care Team: Mahlon GammonGupta, Gwendolynn Merkey L, MD as PCP - General (Internal Medicine) [place]Jordan, [name], MD as PCP - Cardiology (Cardiology) [place]azilandJordan, [name], MD as Consulting Physician (Cardiology) Vanessa Barbaraavis, Jennaya L, NP as Nurse Practitioner Noel ChristmasPace, Maryellen D, MD as Consulting Physician (Urology) Danice GoltzFenton, Clint R, [place] as Physician Assistant (Cardiology) Ronnald NianLalonde, John C, MD as Consulting Physician (Family Medicine) Noel ChristmasPace, Maryellen D, MD as Consulting Physician (Urology)  Extended Emergency Contact Information Primary Emergency Contact: Kohlmeyer,Dr. Alphia MohGregory          [place], [place] Home Phone: [phone] Mobile Phone: [phone] Relation: Son Secondary Emergency Contact: [facility] Address: [address]          [place], [place] [address] [place] Home Phone: [phone] Relation: Daughter  Code Status: DNR Goals of Care: Advanced Directive information    07/27/2022    2:33 PM  Advanced Directives  Does Patient Have a Medical Advance Directive? Yes  Type of Estate agentAdvance Directive Healthcare Power of [place] BendAttorney;Living will;Out of facility DNR (pink MOST or yellow form)  Does patient want to make changes to medical advance directive? No - Patient declined      Chief Complaint  Patient presents with   New Admit To SNF    Patient is being seen for new admission to SNF    HPI: Patient is a 89 y.o. female seen today for Readmission to SNF due to Falls at home  [place]  Patient has a history of PAF on Eliquis, recurrent UTI and is on Macrobid, HLD, hypertension, IBS with diarrhea, anxiety.  Got Covid infection on 04/19/22 Was treated with Paxlovid And Depression  Patient has had 4 falls since March 3rd Most of them happened when she feels Dizzy and feels her legs are getting weaker and she comes  down. She denies Syncope Or Chest pain or Palpitations Just weak in her Legs No Vertigo One fall happened in front of her Son who is Physician Per her she was standing in the kitchen when she felt she was going down and her son was able to hold her She is now in Rehab Still Feels weak when stand up  She says her Appetite is good but she has lost almost 8 lbs since her last visit with me in Jan There is some question of Self neglect  Patient denies Depression but gets very anxious Did have episode of diarrhea before her last fall and had taken Imodium. Saw Cardiology today who think it is  Decondioned.    Past Medical History:  Diagnosis Date   Allergic rhinitis    Arthritis    "hands, little in my feet" (06/22/2016)   Candidiasis of vulva and vagina    Cystocele, midline    Disorder of bone and cartilage, unspecified    Fibroids    Hypertension    Large hiatal hernia 06/23/2016   Migraine    "none in the last few years" (06/22/2016)   Osteopenia    Other chronic cystitis    Other sign and symptom in breast    Paroxysmal atrial fibrillation 11/30/2012   Pneumonia    "I've had walking pneumonia a couple times"  (06/22/2016)   Pneumonia dx'd 06/15/2016   Rectal incontinence    Stroke 2014   hx of mini stroke    TIA (transient  ischemic attack) 2015   Past Surgical History:  Procedure Laterality Date   CARDIOVERSION N/A 06/29/2016   Procedure: CARDIOVERSION;  Surgeon: Jake Bathe, MD;  Location: [facility] OR;  Service: Cardiovascular;  Laterality: N/A;   CATARACT EXTRACTION W/ INTRAOCULAR LENS IMPLANT Left    DILATION AND CURETTAGE OF UTERUS     hiatel hernia  08/20/2016   HYSTEROSCOPY WITH D & C  03/16/1999   INSERTION OF MESH N/A 08/20/2016   Procedure: INSERTION OF MESH;  Surgeon: Axel Filler, MD;  Location: [facility] ORS;  Service: General;  Laterality: N/A;   TEE WITHOUT CARDIOVERSION N/A 11/06/2012   Procedure: TRANSESOPHAGEAL ECHOCARDIOGRAM (TEE);  Surgeon: Lewayne Bunting,  MD;  Location[facility] END[facility]COPY;  Service: Cardiovascular;  Laterality: N/A;   TONSILLECTOMY  1937   VAGINAL HYSTERECTOMY  09/2005   Anterior repair; Removal of urethral caruncle./notes 09/02/2015    reports that she has never smoked. She has never used smokeless tobacco. She reports current alcohol use of about 1.0 standard drink of alcohol per week. She reports that she does not use drugs. Social History   Socioeconomic History   Marital status: Widowed    Spouse name: Not on file   Number of children: 2   Years of education: Not on file   Highest education level: Not on file  Occupational History   Not on file  Tobacco Use   Smoking status: Never   Smokeless tobacco: Never   Tobacco comments:    Never smoke 10/01/21  Vaping Use   Vaping Use: Never used  Substance and Sexual Activity   Alcohol use: Yes    Alcohol/week: 1.0 standard drink of alcohol    Types: 1 Glasses of wine per week    Comment: one glass of wine once a month 10/01/21   Drug use: No   Sexual activity: Not Currently  Other Topics Concern   Not on file  Social History Narrative   Patient is an independent resident at [facility] and lives by herself-no pets.   Past profession was a Futures trader, Diplomatic Services operational officer, and Engineer, technical sales.   Exercise by walking everyday.    Patient has a living will. POA and DNR.    Social Determinants of Health   Financial Resource Strain: Low Risk  (12/14/2020)   Overall Financial Resource Strain (CARDIA)    Difficulty of Paying Living Expenses: Not hard at all  Food Insecurity: No Food Insecurity (12/14/2020)   Hunger Vital Sign    Worried About Running Out of Food in the Last Year: Never true    Ran Out of Food in the Last Year: Never true  Transportation Needs: No Transportation Needs (12/14/2020)   PRAPARE - Administrator, Civil Service (Medical): No    Lack of Transportation (Non-Medical): No  Physical Activity: Insufficiently Active (12/14/2020)   Exercise Vital Sign    Days of  Exercise per Week: 7 days    Minutes of Exercise per Session: 10 min  Stress: Stress Concern Present (12/14/2020)   [employer] of Occupational Health - Occupational Stress Questionnaire    Feeling of Stress : To some extent  Social Connections: Moderately Isolated (12/14/2020)   Social Connection and Isolation Panel [NHANES]    Frequency of Communication with Friends and Family: Three times a week    Frequency of Social Gatherings with Friends and Family: Three times a week    Attends Religious Services: More than 4 times per year    Active Member of Clubs or Organizations: No  Attends Banker Meetings: Never    Marital Status: Widowed  Intimate Partner Violence: Not At Risk (12/14/2020)   Humiliation, Afraid, Rape, and Kick questionnaire    Fear of Current or Ex-Partner: No    Emotionally Abused: No    Physically Abused: No    Sexually Abused: No    Functional Status Survey:    Family History  Problem Relation Age of Onset   Cancer Mother    Hypertension Mother    Heart disease Father    Heart disease Brother    Emphysema Brother     Health Maintenance  Topic Date Due   COVID-19 Vaccine (5 - 2023-24 season) 08/11/2022 (Originally 12/18/2021)   Medicare Annual Wellness (AWV)  08/25/2022 (Originally 12/14/2021)   INFLUENZA VACCINE  11/18/2022   DTaP/Tdap/Td (5 - Td or Tdap) 02/19/2026   Pneumonia Vaccine 102+ Years old  Completed   DEXA SCAN  Completed   Zoster Vaccines- Shingrix  Completed   HPV VACCINES  Aged Out   MAMMOGRAM  Discontinued    Allergies  Allergen Reactions   Lactose Other (See Comments)    Dairy Sensitivity  Dairy Sensitivity     Lisinopril Other (See Comments)    DIZZY   Bactrim [Sulfamethoxazole-Trimethoprim] Rash    Outpatient Encounter Medications as of 07/27/2022  Medication Sig   acetaminophen (TYLENOL) 500 MG tablet Take 500 mg by mouth every 6 (six) hours as needed. And one tablet by mouth once a morning.   ALPRAZolam  (XANAX) 0.25 MG tablet Take 1 tablet (0.25 mg total) by mouth 2 (two) times daily as needed for anxiety.   amiodarone (PACERONE) 200 MG tablet Take 1/2 tablet ( 100 mg ) daily   CALCIUM CARBONATE-VIT D-MIN PO Take 2 tablets by mouth 3 (three) times a week.    CRANBERRY PO Take 1 tablet by mouth daily.   ELIQUIS 5 MG TABS tablet TAKE 1 TABLET BY MOUTH TWICE DAILY   estradiol (ESTRACE) 0.1 MG/GM vaginal cream Place 1 Applicatorful vaginally 3 (three) times a week.   Eyelid Cleansers (OCUSOFT EYELID CLEANSING EX) Apply 1 application  topically as needed (dry eyes).   fluticasone (FLONASE) 50 MCG/ACT nasal spray Place 2 sprays into both nostrils daily. X 2 weeks   Glucosamine-Chondroitin (COSAMIN DS PO) Take 1 tablet by mouth 2 (two) times daily.   loperamide (IMODIUM A-D) 2 MG tablet Take 2 mg by mouth as needed for diarrhea or loose stools.   loratadine (CLARITIN) 10 MG tablet Take 10 mg by mouth daily as needed for allergies.    nitrofurantoin, macrocrystal-monohydrate, (MACROBID) 100 MG capsule Take 100 mg by mouth at bedtime.   ondansetron (ZOFRAN) 4 MG tablet Take 1 tablet (4 mg total) by mouth every 8 (eight) hours as needed for nausea or vomiting.   Polyethyl Glycol-Propyl Glycol (SYSTANE OP) Place 1 drop into both eyes 2 (two) times daily as needed (for dry eyes).    pravastatin (PRAVACHOL) 40 MG tablet TAKE 1 TABLET(40 MG) BY MOUTH DAILY   [DISCONTINUED] sertraline (ZOLOFT) 50 MG tablet Take 1 tablet (50 mg total) by mouth daily.   No facility-administered encounter medications on file as of 07/27/2022.    Review of Systems  Constitutional:  Positive for activity change, appetite change and unexpected weight change.  HENT: Negative.    Respiratory:  Negative for cough and shortness of breath.   Cardiovascular:  Positive for leg swelling.  Gastrointestinal:  Negative for constipation and diarrhea.  Genitourinary: Negative.   Musculoskeletal:  Positive for gait problem. Negative for  arthralgias and myalgias.  Skin: Negative.   Neurological:  Positive for weakness. Negative for dizziness.  Psychiatric/Behavioral:  Positive for confusion. Negative for dysphoric mood and sleep disturbance. The patient is nervous/anxious.     Vitals:   07/27/22 1428  BP: (!) 157/70  Pulse: 68  Resp: 20  Temp: (!) 97.2 F (36.2 C)  TempSrc: Temporal  SpO2: 97%  Weight: 136 lb (61.7 kg)  Height: 5\' 6"  (1.676 m)   Body mass index is 21.95 kg/m. Physical Exam Vitals reviewed.  Constitutional:      Appearance: Normal appearance.  HENT:     Head: Normocephalic.     Nose: Nose normal.     Mouth/Throat:     Mouth: Mucous membranes are moist.     Pharynx: Oropharynx is clear.  Eyes:     Pupils: Pupils are equal, round, and reactive to light.  Cardiovascular:     Rate and Rhythm: Normal rate and regular rhythm.     Pulses: Normal pulses.     Heart sounds: Normal heart sounds. No murmur heard. Pulmonary:     Effort: Pulmonary effort is normal.     Breath sounds: Normal breath sounds.  Abdominal:     General: Abdomen is flat. Bowel sounds are normal.     Palpations: Abdomen is soft.  Musculoskeletal:        General: Swelling present.     Cervical back: Neck supple.  Skin:    General: Skin is warm.  Neurological:     General: No focal deficit present.     Mental Status: She is alert and oriented to person, place, and time.     Comments: She feels weak when she stands up.not dizzy  Psychiatric:        Mood and Affect: Mood normal.        Thought Content: Thought content normal.     Labs reviewed: Basic Metabolic Panel: Recent Labs    01/24/22 1146 01/25/22 0020 01/26/22 0105 02/01/22 0000 05/20/22 1356 05/31/22 0000  NA 141 141 139 144 136 142  K 3.8 3.9 3.8 4.3 3.9 4.3  CL 107 107 106 107 102 105  CO2 26 23 24  23* 26 24*  GLUCOSE 102* 97 107*  --  116*  --   BUN 20 21 13  23* 17 14  CREATININE 0.74 0.81 0.65 0.7 0.99 0.8  CALCIUM 9.1 8.9 8.7* 9.5 8.9 9.6   MG 2.0 1.9 2.0  --   --   --    Liver Function Tests: Recent Labs    01/19/22 1946 05/20/22 1356  AST 30 24  ALT 18 15  ALKPHOS 61 43  BILITOT 0.6 0.5  PROT 8.5* 6.7  ALBUMIN 4.6 3.6   No results for input(s): "LIPASE", "AMYLASE" in the last 8760 hours. No results for input(s): "AMMONIA" in the last 8760 hours. CBC: Recent Labs    01/19/22 1946 01/21/22 0018 01/22/22 0545 01/23/22 0038  WBC 8.9 13.1* 8.6 8.7  NEUTROABS 6.4  --   --   --   HGB 15.9* 13.7 12.7 12.3  HCT 47.7* 41.1 38.5 37.9  MCV 92.4 90.5 91.4 92.0  PLT 263 264 229 269   Cardiac Enzymes: No results for input(s): "CKTOTAL", "CKMB", "CKMBINDEX", "TROPONINI" in the last 8760 hours. BNP: Invalid input(s): "POCBNP" Lab Results  Component Value Date   HGBA1C 5.9 (A) 12/04/2019   Lab Results  Component Value Date   TSH 3.663 05/20/2022  No results found for: "VITAMINB12" No results found for: "FOLATE" No results found for: "IRON", "TIBC", "FERRITIN"  Imaging and Procedures obtained prior to SNF admission: No results found.  Assessment/Plan 1. Generalized weakness/ Deconditioning With Weight loss ? Due to Cognition ? Post Covid She had Covid in 01/24 Will Do Therapy   2. Fall, subsequent encounter Patient was Orthostatic here SBP dropped to 97 when she stood up Will Continue to monitor Also Urine is pending Her labs were all in good limits We have also stopped her Zoloft ? Causing her to be Unstable  3. Recurrent UTI UA pending On Macrobid and Cranberry  4. Mild cognitive impairment Possible AL placement  5. Paroxysmal atrial fibrillation On Amiodarone and Eliquis  6. Generalized anxiety disorder Will take her Xanax PRN Holding Zoloft for now  7. Hyperlipidemia with target LDL less than 130 Pravastatin  8. Poor appetite     Family/ staff Communication:   Labs/tests ordered:

## 2022-07-27 NOTE — Patient Instructions (Signed)
Medication Instructions:  Decrease Amiodarone to 100 mg daily Continue all other medications *If you need a refill on your cardiac medications before your next appointment, please call your pharmacy*   Lab Work: None ordered   Testing/Procedures: None ordered   Follow-Up: At [facility], you and your health needs are our priority.  As part of our continuing mission to provide you with exceptional heart care, we have created designated Provider Care Teams.  These Care Teams include your primary Cardiologist (physician) and Advanced Practice Providers (APPs -  Physician Assistants and Nurse Practitioners) who all work together to provide you with the care you need, when you need it.  We recommend signing up for the patient portal called "MyChart".  Sign up information is provided on this After Visit Summary.  MyChart is used to connect with patients for Virtual Visits (Telemedicine).  Patients are able to view lab/test results, encounter notes, upcoming appointments, etc.  Non-urgent messages can be sent to your provider as well.   To learn more about what you can do with MyChart, go to [email or web address].    Your next appointment:  4 months    Provider: Dr.Jordan

## 2022-07-29 ENCOUNTER — Telehealth: Payer: Self-pay | Admitting: Adult Health

## 2022-07-29 DIAGNOSIS — N3000 Acute cystitis without hematuria: Secondary | ICD-10-CM

## 2022-07-29 MED ORDER — CEFPODOXIME PROXETIL 100 MG PO TABS: 100.0000 mg | ORAL_TABLET | Freq: Two times a day (BID) | ORAL | 0 refills | Status: DC

## 2022-07-29 NOTE — Telephone Encounter (Signed)
UA C and S returned >100,000 colonies of Klebsiella pan sensitive to cephalosporins UA showed 3 + bacteria, leuk esterase 500 WBC 51-100 Nitrite 2+ Having weakness, no other urinary symptoms Will prescribe cefpodoxime

## 2022-08-03 ENCOUNTER — Non-Acute Institutional Stay (SKILLED_NURSING_FACILITY): Payer: Medicare Other | Admitting: Orthopedic Surgery

## 2022-08-03 ENCOUNTER — Encounter: Payer: Self-pay | Admitting: Orthopedic Surgery

## 2022-08-03 DIAGNOSIS — N3000 Acute cystitis without hematuria: Secondary | ICD-10-CM | POA: Diagnosis not present

## 2022-08-03 DIAGNOSIS — I951 Orthostatic hypotension: Secondary | ICD-10-CM

## 2022-08-03 DIAGNOSIS — R531 Weakness: Secondary | ICD-10-CM | POA: Diagnosis not present

## 2022-08-03 NOTE — Progress Notes (Signed)
[facility] Room Number: 156-A Place of Service:  SNF [phone]) Provider:  Hazle Nordmann, NP  PCP: Mahlon Gammon, MD  Patient Care Team: Mahlon Gammon, MD as PCP - General (Internal Medicine) [place], [name], MD as PCP - Cardiology (Cardiology) [place], [name], MD as Consulting Physician (Cardiology) Vanessa Barbara, NP as Nurse Practitioner Noel Christmas, MD as Consulting Physician (Urology) Danice Goltz, [place] as Physician Assistant (Cardiology) Ronnald Nian, MD as Consulting Physician (Family Medicine) Noel Christmas, MD as Consulting Physician (Urology)  Extended Emergency Contact Information Primary Emergency Contact: Rathert,Dr. Alphia Moh, [place] Home Phone: [phone] Mobile Phone: [phone] Relation: Son Secondary Emergency Contact: [facility] Address: [address]          [place], [place] [address] [place] Home Phone: [phone] Relation: Daughter  Code Status:  DNR Goals of care: Advanced Directive information    08/03/2022   10:37 AM  Advanced Directives  Does Patient Have a Medical Advance Directive? Yes  Type of Advance Directive Out of facility DNR (pink MOST or yellow form)  Does patient want to make changes to medical advance directive? No - Patient declined     Chief Complaint  Patient presents with   Acute Visit    Orthostatic hypotension     HPI:  Pt is a 89 y.o. female seen today for an acute visit due to orthostatic hypotension.   She currently resides on the skilled nursing unit at [facility].      Past Medical History:  Diagnosis Date   Allergic rhinitis    Arthritis    "hands, little in my feet" (06/22/2016)   Candidiasis of vulva and vagina    Cystocele, midline    Disorder of bone and cartilage, unspecified    Fibroids    Hypertension    Large hiatal hernia 06/23/2016   Migraine    "none in the last few  years" (06/22/2016)   Osteopenia    Other chronic cystitis    Other sign and symptom in breast    Paroxysmal atrial fibrillation 11/30/2012   Pneumonia    "I've had walking pneumonia a couple times"  (06/22/2016)   Pneumonia dx'd 06/15/2016   Rectal incontinence    Stroke 2014   hx of mini stroke    TIA (transient ischemic attack) 2015   Past Surgical History:  Procedure Laterality Date   CARDIOVERSION N/A 06/29/2016   Procedure: CARDIOVERSION;  Surgeon: Jake Bathe, MD;  [facility]: [facility] OR;  Service: Cardiovascular;  Laterality: N/A;   CATARACT EXTRACTION W/ INTRAOCULAR LENS IMPLANT Left    DILATION AND CURETTAGE OF UTERUS     hiatel hernia  08/20/2016   HYSTEROSCOPY WITH D & C  03/16/1999   INSERTION OF MESH N/A 08/20/2016   Procedure: INSERTION OF MESH;  Surgeon: Axel Filler, MD;  [facility]: [facility] ORS;  Service: General;  Laterality: N/A;   TEE WITHOUT CARDIOVERSION N/A 11/06/2012   Procedure: TRANSESOPHAGEAL ECHOCARDIOGRAM (TEE);  Surgeon: Lewayne Bunting, MD;  Location: [facility] ENDOSCOPY;  Service: Cardiovascular;  Laterality: N/A;   TONSILLECTOMY  1937   VAGINAL HYSTERECTOMY  09/2005   Anterior repair; Removal of urethral caruncle./notes 09/02/2015    Allergies  Allergen Reactions   Lactose Other (See Comments)    Dairy Sensitivity  Dairy Sensitivity     Lisinopril Other (See Comments)    DIZZY  Other     SEASONAL ALLERGIES   Bactrim [Sulfamethoxazole-Trimethoprim] Rash    Allergies as of 08/03/2022       Reactions   Lactose Other (See Comments)   Dairy Sensitivity  Dairy Sensitivity    Lisinopril Other (See Comments)   DIZZY   Other    SEASONAL ALLERGIES   Bactrim [sulfamethoxazole-trimethoprim] Rash        Medication List        Accurate as of August 03, 2022 10:37 AM. If you have any questions, ask your nurse or doctor.          STOP taking these medications    cefpodoxime 100 MG tablet Commonly known as: VANTIN Stopped by: Octavia Heir, NP    fluticasone 50 MCG/ACT nasal spray Commonly known as: FLONASE Stopped by: Octavia Heir, NP       TAKE these medications    acetaminophen 500 MG tablet Commonly known as: TYLENOL Take 500 mg by mouth every morning. And one tablet by mouth once a morning.   acetaminophen 500 MG tablet Commonly known as: TYLENOL Take 500 mg by mouth every 6 (six) hours as needed.   ALPRAZolam 0.25 MG tablet Commonly known as: XANAX Take 1 tablet (0.25 mg total) by mouth 2 (two) times daily as needed for anxiety.   amiodarone 100 MG tablet Commonly known as: PACERONE Take 100 mg by mouth every morning. What changed: Another medication with the same name was removed. Continue taking this medication, and follow the directions you see here. Changed by: Octavia Heir, NP   CALCIUM CARBONATE-VIT D-MIN PO Take 2 tablets by mouth 3 (three) times a week.   COSAMIN DS PO Take 1 tablet by mouth 2 (two) times daily.   CRANBERRY PO Take 450 mg by mouth daily.   Eliquis 5 MG Tabs tablet Generic drug: apixaban TAKE 1 TABLET BY MOUTH TWICE DAILY   estradiol 0.1 MG/GM vaginal cream Commonly known as: ESTRACE Place 1 Applicatorful vaginally 3 (three) times a week.   loperamide 2 MG tablet Commonly known as: IMODIUM A-D Take 2 mg by mouth as needed for diarrhea or loose stools.   loratadine 10 MG tablet Commonly known as: CLARITIN Take 10 mg by mouth daily as needed for allergies.   nitrofurantoin (macrocrystal-monohydrate) 100 MG capsule Commonly known as: MACROBID Take 100 mg by mouth at bedtime.   OCUSOFT EYELID CLEANSING EX Apply 1 application  topically as needed (dry eyes).   ondansetron 4 MG tablet Commonly known as: Zofran Take 1 tablet (4 mg total) by mouth every 8 (eight) hours as needed for nausea or vomiting.   pravastatin 40 MG tablet Commonly known as: PRAVACHOL TAKE 1 TABLET(40 MG) BY MOUTH DAILY   SYSTANE OP Place 1 drop into both eyes 2 (two) times daily as needed (for  dry eyes).        Review of Systems  Immunization History  Administered Date(s) Administered   Fluad Quad(high Dose 65+) 01/09/2019, 01/19/2022   Influenza Split 12/18/2013   Influenza, High Dose Seasonal PF 01/24/2018   Influenza-Unspecified 01/12/2015, 01/13/2016, 01/20/2017, 01/24/2018, 01/06/2021   Moderna Covid-19 Vaccine Bivalent Booster 94yrs & up 01/30/2021   Moderna Sars-Covid-2 Vaccination 05/01/2019, 05/29/2019, 03/04/2020   Pneumococcal Conjugate-13 10/02/2013   Pneumococcal Polysaccharide-23 12/09/2014   Td 05/16/1995, 12/10/2004   Tdap 04/19/2004, 02/20/2016   Zoster Recombinat (Shingrix) 10/29/2016, 03/21/2017   Zoster, Live 02/11/2006   Pertinent  Health Maintenance Due  Topic Date Due   INFLUENZA VACCINE  11/18/2022   DEXA SCAN  Completed   MAMMOGRAM  Discontinued      01/25/2022    2:09 AM 01/25/2022    7:25 AM 01/25/2022    7:50 PM 05/11/2022    1:13 PM 06/28/2022    1:49 PM  Fall Risk  Falls in the past year?    1 1  Was there an injury with Fall?    0 0  Fall Risk Category Calculator    1 2  (RETIRED) Patient Fall Risk Level High fall risk High fall risk High fall risk    Patient at Risk for Falls Due to    Impaired mobility History of fall(s)  Fall risk Follow up    Falls evaluation completed Falls evaluation completed   Functional Status Survey:    Vitals:   08/03/22 1028  BP: (!) 171/69  Pulse: 61  Resp: 18  Temp: 99 F (37.2 C)  SpO2: 97%  Weight: 135 lb 6.4 oz (61.4 kg)  Height: 5\' 6"  (1.676 m)   Body mass index is 21.85 kg/m. Physical Exam  Labs reviewed: Recent Labs    01/24/22 1146 01/25/22 0020 01/26/22 0105 02/01/22 0000 05/20/22 1356 05/31/22 0000  NA 141 141 139 144 136 142  K 3.8 3.9 3.8 4.3 3.9 4.3  CL 107 107 106 107 102 105  CO2 26 23 24  23* 26 24*  GLUCOSE 102* 97 107*  --  116*  --   BUN 20 21 13  23* 17 14  CREATININE 0.74 0.81 0.65 0.7 0.99 0.8  CALCIUM 9.1 8.9 8.7* 9.5 8.9 9.6  MG 2.0 1.9 2.0  --   --    --    Recent Labs    01/19/22 1946 05/20/22 1356  AST 30 24  ALT 18 15  ALKPHOS 61 43  BILITOT 0.6 0.5  PROT 8.5* 6.7  ALBUMIN 4.6 3.6   Recent Labs    01/19/22 1946 01/21/22 0018 01/22/22 0545 01/23/22 0038  WBC 8.9 13.1* 8.6 8.7  NEUTROABS 6.4  --   --   --   HGB 15.9* 13.7 12.7 12.3  HCT 47.7* 41.1 38.5 37.9  MCV 92.4 90.5 91.4 92.0  PLT 263 264 229 269   Lab Results  Component Value Date   TSH 3.663 05/20/2022   Lab Results  Component Value Date   HGBA1C 5.9 (A) 12/04/2019   Lab Results  Component Value Date   CHOL 147 05/31/2022   HDL 62 05/31/2022   LDLCALC 62 05/31/2022   TRIG 115 05/31/2022   CHOLHDL 3.1 04/24/2018    Significant Diagnostic Results in last 30 days:  No results found.  Assessment/Plan There are no diagnoses linked to this encounter.   Family/ staff Communication:   Labs/tests ordered:

## 2022-08-04 MED ORDER — CEFPODOXIME PROXETIL 100 MG PO TABS: 100.0000 mg | ORAL_TABLET | Freq: Two times a day (BID) | ORAL | 0 refills | Status: AC

## 2022-08-10 ENCOUNTER — Encounter: Payer: Medicare Other | Admitting: Internal Medicine

## 2022-08-10 ENCOUNTER — Encounter: Payer: Self-pay | Admitting: Internal Medicine

## 2022-08-10 ENCOUNTER — Non-Acute Institutional Stay (SKILLED_NURSING_FACILITY): Payer: Medicare Other | Admitting: Internal Medicine

## 2022-08-10 DIAGNOSIS — I951 Orthostatic hypotension: Secondary | ICD-10-CM | POA: Diagnosis not present

## 2022-08-10 DIAGNOSIS — N390 Urinary tract infection, site not specified: Secondary | ICD-10-CM

## 2022-08-10 DIAGNOSIS — E785 Hyperlipidemia, unspecified: Secondary | ICD-10-CM

## 2022-08-10 DIAGNOSIS — F411 Generalized anxiety disorder: Secondary | ICD-10-CM

## 2022-08-10 DIAGNOSIS — R531 Weakness: Secondary | ICD-10-CM | POA: Diagnosis not present

## 2022-08-10 DIAGNOSIS — I10 Essential (primary) hypertension: Secondary | ICD-10-CM

## 2022-08-10 DIAGNOSIS — J309 Allergic rhinitis, unspecified: Secondary | ICD-10-CM

## 2022-08-10 DIAGNOSIS — I480 Paroxysmal atrial fibrillation: Secondary | ICD-10-CM

## 2022-08-10 DIAGNOSIS — G3184 Mild cognitive impairment, so stated: Secondary | ICD-10-CM

## 2022-08-10 NOTE — Progress Notes (Signed)
Location: [facility] Room Number: 156A Place of Service:  SNF (31)  Provider:   Code Status: DNR Goals of Care:     08/10/2022    2:40 PM  Advanced Directives  Does Patient Have a Medical Advance Directive? Yes  Type of Estate agent of [place];Living will;Out of facility DNR (pink MOST or yellow form)  Copy of Healthcare Power of Attorney in Chart? No - copy requested     Chief Complaint  Patient presents with   Acute Visit    HPI: Patient is a 89 y.o. female seen today for an acute visit for Follow up for Runny nose and Headache and Moving [place]  [place] due [place]   Patient has a history of PAF on Eliquis, recurrent UTI and is on Macrobid, HLD, hypertension, IBS with diarrhea, anxiety.  Got Covid infection on 04/19/22 Was treated with Paxlovid And Depression   Patient has had 4 falls since March 3rd Most of them happened when she feels Dizzy and feels her legs are getting weaker and she comes down.Was found to be orthostatic in rehab and got 500 cc of IV fluids Also was taken off Zoloft as possible causing these issues Continues to have issues with depression and Anxiety  Today her main c/o Headache and Runny Nose Still feels weak and plan for her to go to [place] which she wanted to make sure is right plan Has seen Cardiology for falls and they think it is Deconditioning after Covid .l Past Medical History:  Diagnosis Date   Allergic rhinitis    Arthritis    "hands, little in my feet" (06/22/2016)   Candidiasis of vulva and vagina    Cystocele, midline    Disorder of bone and cartilage, unspecified    Fibroids    Hypertension    Large hiatal hernia 06/23/2016   Migraine    "none in the last few years" (06/22/2016)   Osteopenia    Other chronic cystitis    Other sign and symptom in breast    Paroxysmal atrial fibrillation 11/30/2012   Pneumonia    "I've had walking pneumonia  a couple times"  (06/22/2016)   Pneumonia dx'd 06/15/2016   Rectal incontinence    Stroke 2014   hx of mini stroke    TIA (transient ischemic attack) 2015    Past Surgical History:  Procedure Laterality Date   CARDIOVERSION N/A 06/29/2016   Procedure: CARDIOVERSION;  Surgeon: Jake Bathe, MD;  Location: [facility] OR;  Service: Cardiovascular;  Laterality: N/A;   CATARACT EXTRACTION W/ INTRAOCULAR LENS IMPLANT Left    DILATION AND CURETTAGE OF UTERUS     hiatel hernia  08/20/2016   HYSTEROSCOPY WITH D & C  03/16/1999   INSERTION OF MESH N/A 08/20/2016   Procedure: INSERTION OF MESH;  Surgeon: Axel Filler, MD;  Location: [facility] ORS;  Service: General;  Laterality: N/A;   TEE WITHOUT CARDIOVERSION N/A 11/06/2012   Procedure: TRANSESOPHAGEAL ECHOCARDIOGRAM (TEE);  Surgeon: Lewayne Bunting, MD;  Location: [facility] ENDOSCOPY;  Service: Cardiovascular;  Laterality: N/A;   TONSILLECTOMY  1937   VAGINAL HYSTERECTOMY  09/2005   Anterior repair; Removal of urethral caruncle./notes 09/02/2015    Allergies  Allergen Reactions   Lactose Other (See Comments)    Dairy Sensitivity  Dairy Sensitivity     Lisinopril Other (See Comments)    DIZZY   Other     SEASONAL ALLERGIES  Bactrim [Sulfamethoxazole-Trimethoprim] Rash    Outpatient Encounter Medications as of 08/10/2022  Medication Sig   acetaminophen (TYLENOL) 500 MG tablet Take 500 mg by mouth every morning. And one tablet by mouth once a morning.   acetaminophen (TYLENOL) 500 MG tablet Take 500 mg by mouth every 6 (six) hours as needed.   ALPRAZolam (XANAX) 0.25 MG tablet Take 1 tablet (0.25 mg total) by mouth 2 (two) times daily as needed for anxiety.   amiodarone (PACERONE) 100 MG tablet Take 100 mg by mouth every morning.   CALCIUM CARBONATE-VIT D-MIN PO Take 2 tablets by mouth 3 (three) times a week.    CRANBERRY PO Take 450 mg by mouth daily.   ELIQUIS 5 MG TABS tablet TAKE 1 TABLET BY MOUTH TWICE DAILY   estradiol (ESTRACE) 0.1 MG/GM  vaginal cream Place 1 Applicatorful vaginally 3 (three) times a week.   Eyelid Cleansers (OCUSOFT EYELID CLEANSING EX) Apply 1 application  topically as needed (dry eyes).   Glucosamine-Chondroitin (COSAMIN DS PO) Take 1 tablet by mouth 2 (two) times daily.   loperamide (IMODIUM A-D) 2 MG tablet Take 2 mg by mouth as needed for diarrhea or loose stools.   loratadine (CLARITIN) 10 MG tablet Take 10 mg by mouth daily as needed for allergies.    nitrofurantoin, macrocrystal-monohydrate, (MACROBID) 100 MG capsule Take 100 mg by mouth at bedtime.   ondansetron (ZOFRAN) 4 MG tablet Take 1 tablet (4 mg total) by mouth every 8 (eight) hours as needed for nausea or vomiting.   Polyethyl Glycol-Propyl Glycol (SYSTANE OP) Place 1 drop into both eyes 2 (two) times daily as needed (for dry eyes).    pravastatin (PRAVACHOL) 40 MG tablet TAKE 1 TABLET(40 MG) BY MOUTH DAILY   No facility-administered encounter medications on file as of 08/10/2022.    Review of Systems:  Review of Systems  Constitutional:  Positive for activity change. Negative for appetite change.  HENT:  Positive for postnasal drip and rhinorrhea.   Respiratory:  Negative for cough and shortness of breath.   Cardiovascular:  Negative for leg swelling.  Gastrointestinal:  Negative for constipation.  Genitourinary: Negative.   Musculoskeletal:  Positive for gait problem. Negative for arthralgias and myalgias.  Skin: Negative.   Neurological:  Positive for weakness. Negative for dizziness.  Psychiatric/Behavioral:  Positive for dysphoric mood. Negative for confusion and sleep disturbance. The patient is nervous/anxious.     Health Maintenance  Topic Date Due   COVID-19 Vaccine (5 - 2023-24 season) 08/11/2022 (Originally 12/18/2021)   Medicare Annual Wellness (AWV)  08/25/2022 (Originally 12/14/2021)   INFLUENZA VACCINE  11/18/2022   DTaP/Tdap/Td (5 - Td or Tdap) 02/19/2026   Pneumonia Vaccine 69+ Years old  Completed   DEXA SCAN   Completed   Zoster Vaccines- Shingrix  Completed   HPV VACCINES  Aged Out   MAMMOGRAM  Discontinued    Physical Exam: Vitals:   08/10/22 1438  BP: (!) 152/70  Pulse: 81  Resp: 17  Temp: 97.8 F (36.6 C)  TempSrc: Temporal  SpO2: 97%  Weight: 135 lb 12.8 oz (61.6 kg)  Height: 5\' 6"  (1.676 m)   Body mass index is 21.92 kg/m. Physical Exam Vitals reviewed.  Constitutional:      Appearance: Normal appearance.  HENT:     Head: Normocephalic.     Nose: Congestion present.     Mouth/Throat:     Mouth: Mucous membranes are moist.     Pharynx: Oropharynx is clear.  Eyes:  Pupils: Pupils are equal, round, and reactive to light.  Cardiovascular:     Rate and Rhythm: Normal rate and regular rhythm.     Pulses: Normal pulses.     Heart sounds: Normal heart sounds. No murmur heard. Pulmonary:     Effort: Pulmonary effort is normal.     Breath sounds: Normal breath sounds.  Abdominal:     General: Abdomen is flat. Bowel sounds are normal.     Palpations: Abdomen is soft.  Musculoskeletal:        General: No swelling.     Cervical back: Neck supple.  Skin:    General: Skin is warm.  Neurological:     General: No focal deficit present.     Mental Status: She is alert and oriented to person, place, and time.  Psychiatric:        Mood and Affect: Mood normal.        Thought Content: Thought content normal.     Labs reviewed: Basic Metabolic Panel: Recent Labs    01/19/22 1846 01/19/22 1946 01/24/22 1146 01/25/22 0020 01/26/22 0105 02/01/22 0000 05/20/22 1356 05/20/22 1430 05/31/22 0000  NA  --    < > 141 141 139 144 136  --  142  K  --    < > 3.8 3.9 3.8 4.3 3.9  --  4.3  CL  --    < > 107 107 106 107 102  --  105  CO2  --    < > 26 23 24  23* 26  --  24*  GLUCOSE  --    < > 102* 97 107*  --  116*  --   --   BUN  --    < > 20 21 13  23* 17  --  14  CREATININE  --    < > 0.74 0.81 0.65 0.7 0.99  --  0.8  CALCIUM  --    < > 9.1 8.9 8.7* 9.5 8.9  --  9.6  MG   --    < > 2.0 1.9 2.0  --   --   --   --   TSH 2.428  --   --   --   --   --   --  3.663  --    < > = values in this interval not displayed.   Liver Function Tests: Recent Labs    01/19/22 1946 05/20/22 1356  AST 30 24  ALT 18 15  ALKPHOS 61 43  BILITOT 0.6 0.5  PROT 8.5* 6.7  ALBUMIN 4.6 3.6   No results for input(s): "LIPASE", "AMYLASE" in the last 8760 hours. No results for input(s): "AMMONIA" in the last 8760 hours. CBC: Recent Labs    01/19/22 1946 01/21/22 0018 01/22/22 0545 01/23/22 0038  WBC 8.9 13.1* 8.6 8.7  NEUTROABS 6.4  --   --   --   HGB 15.9* 13.7 12.7 12.3  HCT 47.7* 41.1 38.5 37.9  MCV 92.4 90.5 91.4 92.0  PLT 263 264 229 269   Lipid Panel: Recent Labs    05/31/22 0000  CHOL 147  HDL 62  LDLCALC 62  TRIG 115   Lab Results  Component Value Date   HGBA1C 5.9 (A) 12/04/2019    Procedures since last visit: No results found.  Assessment/Plan 1. Allergic rhinitis, mild Flonase  2. Orthostatic hypotension Better since got Fluid but continues ot be her issue Loose Control of her BP  3. Generalized weakness  Working with therapy and Plan to go to [place]  4. Recurrent UTI On Macrobid  5. Mild cognitive impairment Plan to go to [place] Which I agree  6. Paroxysmal atrial fibrillation Eliquis and Amiodarone  7. Generalized anxiety disorder Using Xanax Prn Took her off Zoloft Can consider starting some other SSRI She denies any depression just Anxious 8. Hyperlipidemia with target LDL less than 130 Statin  9. Essential hypertension, benign No Meds Loose Control    Labs/tests ordered:  * No order type specified * Next appt:  Visit date not found

## 2022-08-16 ENCOUNTER — Non-Acute Institutional Stay (SKILLED_NURSING_FACILITY): Payer: Medicare Other | Admitting: Adult Health

## 2022-08-16 ENCOUNTER — Non-Acute Institutional Stay: Payer: Self-pay

## 2022-08-16 DIAGNOSIS — R531 Weakness: Secondary | ICD-10-CM

## 2022-08-16 DIAGNOSIS — F411 Generalized anxiety disorder: Secondary | ICD-10-CM

## 2022-08-16 DIAGNOSIS — I951 Orthostatic hypotension: Secondary | ICD-10-CM

## 2022-08-16 DIAGNOSIS — I480 Paroxysmal atrial fibrillation: Secondary | ICD-10-CM

## 2022-08-16 NOTE — Progress Notes (Deleted)
Location:  [facility] Room Number: 156A Place of Service:  SNF [phone]) Provider: Mahlon Gammon, MD   Mahlon Gammon, MD  Patient Care Team: Mahlon Gammon, MD as PCP - General (Internal Medicine) [place], [name], MD as PCP - Cardiology (Cardiology) [place], [name], MD as Consulting Physician (Cardiology) Vanessa Barbara, NP as Nurse Practitioner Noel Christmas, MD as Consulting Physician (Urology) Danice Goltz, [place] as Physician Assistant (Cardiology) Ronnald Nian, MD as Consulting Physician (Family Medicine) Noel Christmas, MD as Consulting Physician (Urology)  Extended Emergency Contact Information Primary Emergency Contact: Vierra,Dr. Alphia Moh, [place] of [place] Home Phone: [phone] Mobile Phone: [phone] Relation: Son Secondary Emergency Contact: [facility] Address: [address]          [place], [place] [address] D[place] of [place] Home Phone: [phone] Relation: Daughter  Code Status:  DNR Goals of care: Advanced Directive information    08/16/2022    9:54 AM  Advanced Directives  Does Patient Have a Medical Advance Directive? Yes  Type of Estate agent of [place];Living will;Out of facility DNR (pink MOST or yellow form)  Does patient want to make changes to medical advance directive? No - Patient declined  Copy of Healthcare Power of Attorney in Chart? No - copy requested     Chief Complaint  Patient presents with   Acute Visit    Patient is being seen for dizziness    HPI:  Pt is a 89 y.o. female seen today for an acute visit for feeling dizzy and anxious.   She currently resides in skilled rehab due to falls and deconditioning. She was treated for a UTI with cefpodoxime and also received therapy due to weakness as she had a covid infection in January.   She is making some gains and is walking with a walker but still seems unsteady at  times and needs assistance with walking.  Nurse reports she feels anxious sometimes and feels her heart is racing but when her HR is checked the rate is regular and normal. She uses xanax which helps. She tried zoloft prior to this rehab admit but it was discontinued due to concern for falls and or other s/e.  This did not turn out to be the case. The zoloft did seem to help with her symptoms of anxiety.   She was also treated during her stay with IVF for orthostatic hypotension. This helped but now she is feeling dizzy at times and went down on her knees over the weekend due weakness  BP lying 150/68 HR 56 Sitting 124/64 58 Standing 108/50 48  She saw cardiology on 4/9 and was felt to be deconditioned. Amiodarone decreased to 100 mg HR running 50-70s  SBP running high at times 150-190/60-90s but is read typically with the automatic machine.   At this time she is not feeling dizzy or having chest pain or sob.   Moving [place] due to memory loss and functional needs.   Also she was started on flonase and claritin due to nasal congestion/allergies by Dr Chales Abrahams and feels this is helping.  Past Medical History:  Diagnosis Date   Allergic rhinitis    Arthritis    "hands, little in my feet" (06/22/2016)   Candidiasis of vulva and vagina    Cystocele, midline    Disorder of bone and cartilage, unspecified    Fibroids  Hypertension    Large hiatal hernia 06/23/2016   Migraine    "none in the last few years" (06/22/2016)   Osteopenia    Other chronic cystitis    Other sign and symptom in breast    Paroxysmal atrial fibrillation (HCC) 11/30/2012   Pneumonia    "I've had walking pneumonia a couple times"  (06/22/2016)   Pneumonia dx'd 06/15/2016   Rectal incontinence    Stroke (HCC) 2014   hx of mini stroke    TIA (transient ischemic attack) 2015   Past Surgical History:  Procedure Laterality Date   CARDIOVERSION N/A 06/29/2016   Procedure: CARDIOVERSION;  Surgeon: Jake Bathe, MD;   Location: [facility] OR;  Service: Cardiovascular;  Laterality: N/A;   CATARACT EXTRACTION W/ INTRAOCULAR LENS IMPLANT Left    DILATION AND CURETTAGE OF UTERUS     hiatel hernia  08/20/2016   HYSTEROSCOPY WITH D & C  03/16/1999   INSERTION OF MESH N/A 08/20/2016   Procedure: INSERTION OF MESH;  Surgeon: Axel Filler, MD;  Location: [facility] ORS;  Service: General;  Laterality: N/A;   TEE WITHOUT CARDIOVERSION N/A 11/06/2012   Procedure: TRANSESOPHAGEAL ECHOCARDIOGRAM (TEE);  Surgeon: Lewayne Bunting, MD;  Location: [facility] ENDOSCOPY;  Service: Cardiovascular;  Laterality: N/A;   TONSILLECTOMY  1937   VAGINAL HYSTERECTOMY  09/2005   Anterior repair; Removal of urethral caruncle./notes 09/02/2015    Allergies  Allergen Reactions   Lactose Other (See Comments)    Dairy Sensitivity  Dairy Sensitivity     Lisinopril Other (See Comments)    DIZZY   Other     SEASONAL ALLERGIES   Bactrim [Sulfamethoxazole-Trimethoprim] Rash    Outpatient Encounter Medications as of 08/16/2022  Medication Sig   acetaminophen (TYLENOL) 500 MG tablet Take 500 mg by mouth every morning. And one tablet by mouth once a morning.   acetaminophen (TYLENOL) 500 MG tablet Take 500 mg by mouth every 6 (six) hours as needed.   ALPRAZolam (XANAX) 0.25 MG tablet Take 1 tablet (0.25 mg total) by mouth 2 (two) times daily as needed for anxiety.   amiodarone (PACERONE) 100 MG tablet Take 100 mg by mouth every morning.   CALCIUM CARBONATE-VIT D-MIN PO Take 2 tablets by mouth 3 (three) times a week.    CRANBERRY PO Take 450 mg by mouth daily.   ELIQUIS 5 MG TABS tablet TAKE 1 TABLET BY MOUTH TWICE DAILY   estradiol (ESTRACE) 0.1 MG/GM vaginal cream Place 1 Applicatorful vaginally 3 (three) times a week.   Eyelid Cleansers (OCUSOFT EYELID CLEANSING EX) Apply 1 application  topically as needed (dry eyes).   fluticasone (FLONASE) 50 MCG/ACT nasal spray Place 2 sprays into both nostrils daily.   Glucosamine-Chondroitin (COSAMIN DS PO) Take  1 tablet by mouth 2 (two) times daily.   loperamide (IMODIUM A-D) 2 MG tablet Take 2 mg by mouth as needed for diarrhea or loose stools.   loratadine (CLARITIN) 10 MG tablet Take 10 mg by mouth daily as needed for allergies.    nitrofurantoin, macrocrystal-monohydrate, (MACROBID) 100 MG capsule Take 100 mg by mouth at bedtime.   ondansetron (ZOFRAN) 4 MG tablet Take 1 tablet (4 mg total) by mouth every 8 (eight) hours as needed for nausea or vomiting.   Polyethyl Glycol-Propyl Glycol (SYSTANE OP) Place 1 drop into both eyes 2 (two) times daily as needed (for dry eyes).    pravastatin (PRAVACHOL) 40 MG tablet TAKE 1 TABLET(40 MG) BY MOUTH DAILY   No facility-administered encounter medications  on file as of 08/16/2022.    Review of Systems  Constitutional:  Positive for activity change. Negative for appetite change, chills, diaphoresis, fatigue, fever and unexpected weight change.  HENT:  Negative for congestion.   Respiratory:  Negative for cough, shortness of breath and wheezing.   Cardiovascular:  Positive for leg swelling. Negative for chest pain and palpitations.  Gastrointestinal:  Negative for abdominal distention, abdominal pain, constipation and diarrhea.  Genitourinary:  Negative for difficulty urinating and dysuria.  Musculoskeletal:  Positive for gait problem. Negative for arthralgias, back pain, joint swelling and myalgias.  Neurological:  Positive for dizziness and weakness. Negative for tremors, seizures, syncope, facial asymmetry, speech difficulty, light-headedness, numbness and headaches.  Psychiatric/Behavioral:  Negative for agitation, behavioral problems and confusion. The patient is nervous/anxious.        Memory loss    Immunization History  Administered Date(s) Administered   Fluad Quad(high Dose 65+) 01/09/2019, 01/19/2022   Influenza Split 12/18/2013   Influenza, High Dose Seasonal PF 01/24/2018   Influenza-Unspecified 01/12/2015, 01/13/2016, 01/20/2017,  01/24/2018, 01/06/2021   Moderna Covid-19 Vaccine Bivalent Booster 73yrs & up 01/30/2021   Moderna Sars-Covid-2 Vaccination 05/01/2019, 05/29/2019, 03/04/2020   Pneumococcal Conjugate-13 10/02/2013   Pneumococcal Polysaccharide-23 12/09/2014   Td 05/16/1995, 12/10/2004   Tdap 04/19/2004, 02/20/2016   Zoster Recombinat (Shingrix) 10/29/2016, 03/21/2017   Zoster, Live 02/11/2006   Pertinent  Health Maintenance Due  Topic Date Due   INFLUENZA VACCINE  11/18/2022   DEXA SCAN  Completed   MAMMOGRAM  Discontinued      01/25/2022    7:50 PM 05/11/2022    1:13 PM 06/28/2022    1:49 PM 08/10/2022    2:40 PM 08/16/2022    9:54 AM  Fall Risk  Falls in the past year?  1 1 1 1   Was there an injury with Fall?  0 0 0 0  Fall Risk Category Calculator  1 2 1 1   (RETIRED) Patient Fall Risk Level High fall risk      Patient at Risk for Falls Due to  Impaired mobility History of fall(s) History of fall(s) History of fall(s)  Fall risk Follow up  Falls evaluation completed Falls evaluation completed Falls evaluation completed Falls evaluation completed   Functional Status Survey:    Vitals:   08/16/22 0941  BP: (!) 154/68  Pulse: 60  Resp: 18  Temp: 98 F (36.7 C)  TempSrc: Temporal  SpO2: 96%  Weight: 135 lb 12.8 oz (61.6 kg)  Height: 5\' 6"  (1.676 m)   Body mass index is 21.92 kg/m. Physical Exam Vitals and nursing note reviewed.  Constitutional:      General: She is not in acute distress.    Appearance: She is not diaphoretic.  HENT:     Head: Normocephalic and atraumatic.  Neck:     Vascular: No carotid bruit or JVD.  Cardiovascular:     Rate and Rhythm: Regular rhythm. Bradycardia present.     Heart sounds: No murmur heard.    Comments: HR 58 Pulmonary:     Effort: Pulmonary effort is normal. No respiratory distress.     Breath sounds: Normal breath sounds. No wheezing.  Musculoskeletal:     Comments: BLE edema +1  Skin:    General: Skin is warm and dry.  Neurological:      General: No focal deficit present.     Mental Status: She is alert and oriented to person, place, and time. Mental status is at baseline.  Labs reviewed: Recent Labs    01/24/22 1146 01/25/22 0020 01/26/22 0105 02/01/22 0000 05/20/22 1356 05/31/22 0000  NA 141 141 139 144 136 142  K 3.8 3.9 3.8 4.3 3.9 4.3  CL 107 107 106 107 102 105  CO2 26 23 24  23* 26 24*  GLUCOSE 102* 97 107*  --  116*  --   BUN 20 21 13  23* 17 14  CREATININE 0.74 0.81 0.65 0.7 0.99 0.8  CALCIUM 9.1 8.9 8.7* 9.5 8.9 9.6  MG 2.0 1.9 2.0  --   --   --    Recent Labs    01/19/22 1946 05/20/22 1356  AST 30 24  ALT 18 15  ALKPHOS 61 43  BILITOT 0.6 0.5  PROT 8.5* 6.7  ALBUMIN 4.6 3.6   Recent Labs    01/19/22 1946 01/21/22 0018 01/22/22 0545 01/23/22 0038  WBC 8.9 13.1* 8.6 8.7  NEUTROABS 6.4  --   --   --   HGB 15.9* 13.7 12.7 12.3  HCT 47.7* 41.1 38.5 37.9  MCV 92.4 90.5 91.4 92.0  PLT 263 264 229 269   Lab Results  Component Value Date   TSH 3.663 05/20/2022   Lab Results  Component Value Date   HGBA1C 5.9 (A) 12/04/2019   Lab Results  Component Value Date   CHOL 147 05/31/2022   HDL 62 05/31/2022   LDLCALC 62 05/31/2022   TRIG 115 05/31/2022   CHOLHDL 3.1 04/24/2018    Significant Diagnostic Results in last 30 days:  No results found.  Assessment/Plan  1. Orthostatic hypotension I need more information including manual blood pressures as she has very labile numbers recorded Ordered manual bp/pulse daily for 3 days then I will f/u Encouraged her to drink oral fluid. Does not appear hypovolemic.   Ordered firmer compression hose, rise slowly, etc  2. Paroxysmal atrial fibrillation (HCC) Rate is still slow , given the half life of amiodarone will give the dose reduction a little more time to work. If still running low at times, consult with cardiology    3. Generalized weakness Improving with therapy but continues to need oversight with ambulation Multifactorial  with bp issues, underlying memory loss,etc  4. Generalized anxiety disorder Consider starting lexapro, Going to monitor her bp through the week and f/u on Thursday. Will consider starting something else and discuss with family   Labs/tests ordered:  NA< recent labs reviewed and appear WNL

## 2022-08-18 ENCOUNTER — Encounter: Payer: Self-pay | Admitting: Adult Health

## 2022-08-18 NOTE — Progress Notes (Signed)
Expand All Collapse All  Location:  [facility] Room Number: 156A Place of Service:  SNF [id number]) Provider: Mahlon Gammon, MD     Mahlon Gammon, MD   Patient Care Team: Mahlon Gammon, MD as PCP - General (Internal Medicine) [place], [name], MD as PCP - Cardiology (Cardiology) [place], [name], MD as Consulting Physician (Cardiology) Vanessa Barbara, NP as Nurse Practitioner Noel Christmas, MD as Consulting Physician (Urology) Danice Goltz, [place] as Physician Assistant (Cardiology) Ronnald Nian, MD as Consulting Physician (Family Medicine) Noel Christmas, MD as Consulting Physician (Urology)   Extended Emergency Contact Information Primary Emergency Contact: Panzer,Dr. Alphia Moh, [place] of [place] Home Phone: [phone] Mobile Phone: [phone] Relation: Son Secondary Emergency Contact: [facility] Address: [address]          [place], [place] [address] [place] of [place] Home Phone: [phone] Relation: Daughter   Code Status:  DNR Goals of care: Advanced Directive information     08/16/2022    9:54 AM  Advanced Directives  Does Patient Have a Medical Advance Directive? Yes  Type of Estate agent of [place];Living will;Out of facility DNR (pink MOST or yellow form)  Does patient want to make changes to medical advance directive? No - Patient declined  Copy of Healthcare Power of Attorney in Chart? No - copy requested            Chief Complaint  Patient presents with  . Acute Visit      Patient is being seen for dizziness      HPI:  Pt is a 89 y.o. female seen today for an acute visit for feeling dizzy and anxious.    She currently resides in skilled rehab due to falls and deconditioning. She was treated for a UTI with cefpodoxime and also received therapy due to weakness as she had a covid infection in January.    She is making some gains and is  walking with a walker but still seems unsteady at times and needs assistance with walking.   Nurse reports she feels anxious sometimes and feels her heart is racing but when her HR is checked the rate is regular and normal. She uses xanax which helps. She tried zoloft prior to this rehab admit but it was discontinued due to concern for falls and or other s/e.  This did not turn out to be the case. The zoloft did seem to help with her symptoms of anxiety.    She was also treated during her stay with IVF for orthostatic hypotension. This helped but now she is feeling dizzy at times and went down on her knees over the weekend due weakness   BP lying 150/68 HR 56 Sitting 124/64 58 Standing 108/50 48   She saw cardiology on 4/9 and was felt to be deconditioned. Amiodarone decreased to 100 mg HR running 50-70s  SBP running high at times 150-190/60-90s but is read typically with the automatic machine.    At this time she is not feeling dizzy or having chest pain or sob.    Moving to [place] due to memory loss and functional needs.    Also she was started on flonase and claritin due to nasal congestion/allergies by Dr Chales Abrahams and feels this is helping.      Past Medical History:  Diagnosis Date  . Allergic rhinitis    .  Arthritis      "hands, little in my feet" (06/22/2016)  . Candidiasis of vulva and vagina    . Cystocele, midline    . Disorder of bone and cartilage, unspecified    . Fibroids    . Hypertension    . Large hiatal hernia 06/23/2016  . Migraine      "none in the last few years" (06/22/2016)  . Osteopenia    . Other chronic cystitis    . Other sign and symptom in breast    . Paroxysmal atrial fibrillation (HCC) 11/30/2012  . Pneumonia      "I've had walking pneumonia a couple times"  (06/22/2016)  . Pneumonia dx'd 06/15/2016  . Rectal incontinence    . Stroke [facility]) 2014    hx of mini stroke   . TIA (transient ischemic attack) 2015         Past Surgical History:  Procedure  Laterality Date  . CARDIOVERSION N/A 06/29/2016    Procedure: CARDIOVERSION;  Surgeon: Jake Bathe, MD;  Location: [facility] OR;  Service: Cardiovascular;  Laterality: N/A;  . CATARACT EXTRACTION W/ INTRAOCULAR LENS IMPLANT Left    . DILATION AND CURETTAGE OF UTERUS      . hiatel hernia   08/20/2016  . HYSTEROSCOPY WITH D & C   03/16/1999  . INSERTION OF MESH N/A 08/20/2016    Procedure: INSERTION OF MESH;  Surgeon: Axel Filler, MD;  Location: [facility] ORS;  Service: General;  Laterality: N/A;  . TEE WITHOUT CARDIOVERSION N/A 11/06/2012    Procedure: TRANSESOPHAGEAL ECHOCARDIOGRAM (TEE);  Surgeon: Lewayne Bunting, MD;  Location: [facility] ENDOSCOPY;  Service: Cardiovascular;  Laterality: N/A;  . TONSILLECTOMY   1937  . VAGINAL HYSTERECTOMY   09/2005    Anterior repair; Removal of urethral caruncle./notes 09/02/2015           Allergies  Allergen Reactions  . Lactose Other (See Comments)      Dairy Sensitivity  Dairy Sensitivity     . Lisinopril Other (See Comments)      DIZZY  . Other        SEASONAL ALLERGIES  . Bactrim [Sulfamethoxazole-Trimethoprim] Rash          Outpatient Encounter Medications as of 08/16/2022  Medication Sig  . acetaminophen (TYLENOL) 500 MG tablet Take 500 mg by mouth every morning. And one tablet by mouth once a morning.  Marland Kitchen acetaminophen (TYLENOL) 500 MG tablet Take 500 mg by mouth every 6 (six) hours as needed.  . ALPRAZolam (XANAX) 0.25 MG tablet Take 1 tablet (0.25 mg total) by mouth 2 (two) times daily as needed for anxiety.  Marland Kitchen amiodarone (PACERONE) 100 MG tablet Take 100 mg by mouth every morning.  Marland Kitchen CALCIUM CARBONATE-VIT D-MIN PO Take 2 tablets by mouth 3 (three) times a week.   Marland Kitchen CRANBERRY PO Take 450 mg by mouth daily.  Marland Kitchen ELIQUIS 5 MG TABS tablet TAKE 1 TABLET BY MOUTH TWICE DAILY  . estradiol (ESTRACE) 0.1 MG/GM vaginal cream Place 1 Applicatorful vaginally 3 (three) times a week.  . Eyelid Cleansers (OCUSOFT EYELID CLEANSING EX) Apply 1 application   topically as needed (dry eyes).  . fluticasone (FLONASE) 50 MCG/ACT nasal spray Place 2 sprays into both nostrils daily.  . Glucosamine-Chondroitin (COSAMIN DS PO) Take 1 tablet by mouth 2 (two) times daily.  Marland Kitchen loperamide (IMODIUM A-D) 2 MG tablet Take 2 mg by mouth as needed for diarrhea or loose stools.  Marland Kitchen loratadine (CLARITIN) 10 MG tablet Take 10  mg by mouth daily as needed for allergies.   . nitrofurantoin, macrocrystal-monohydrate, (MACROBID) 100 MG capsule Take 100 mg by mouth at bedtime.  . ondansetron (ZOFRAN) 4 MG tablet Take 1 tablet (4 mg total) by mouth every 8 (eight) hours as needed for nausea or vomiting.  Bertram Gala Glycol-Propyl Glycol (SYSTANE OP) Place 1 drop into both eyes 2 (two) times daily as needed (for dry eyes).   . pravastatin (PRAVACHOL) 40 MG tablet TAKE 1 TABLET(40 MG) BY MOUTH DAILY    No facility-administered encounter medications on file as of 08/16/2022.      Review of Systems  Constitutional:  Positive for activity change. Negative for appetite change, chills, diaphoresis, fatigue, fever and unexpected weight change.  HENT:  Negative for congestion.   Respiratory:  Negative for cough, shortness of breath and wheezing.   Cardiovascular:  Positive for leg swelling. Negative for chest pain and palpitations.  Gastrointestinal:  Negative for abdominal distention, abdominal pain, constipation and diarrhea.  Genitourinary:  Negative for difficulty urinating and dysuria.  Musculoskeletal:  Positive for gait problem. Negative for arthralgias, back pain, joint swelling and myalgias.  Neurological:  Positive for dizziness and weakness. Negative for tremors, seizures, syncope, facial asymmetry, speech difficulty, light-headedness, numbness and headaches.  Psychiatric/Behavioral:  Negative for agitation, behavioral problems and confusion. The patient is nervous/anxious.        Memory loss          Immunization History  Administered Date(s) Administered  . Fluad  Quad(high Dose 65+) 01/09/2019, 01/19/2022  . Influenza Split 12/18/2013  . Influenza, High Dose Seasonal PF 01/24/2018  . Influenza-Unspecified 01/12/2015, 01/13/2016, 01/20/2017, 01/24/2018, 01/06/2021  . Moderna Covid-19 Vaccine Bivalent Booster 83yrs & up 01/30/2021  . Moderna Sars-Covid-2 Vaccination 05/01/2019, 05/29/2019, 03/04/2020  . Pneumococcal Conjugate-13 10/02/2013  . Pneumococcal Polysaccharide-23 12/09/2014  . Td 05/16/1995, 12/10/2004  . Tdap 04/19/2004, 02/20/2016  . Zoster Recombinat (Shingrix) 10/29/2016, 03/21/2017  . Zoster, Live 02/11/2006        Pertinent  Health Maintenance Due  Topic Date Due  . INFLUENZA VACCINE  11/18/2022  . DEXA SCAN  Completed  . MAMMOGRAM  Discontinued        01/25/2022    7:50 PM 05/11/2022    1:13 PM 06/28/2022    1:49 PM 08/10/2022    2:40 PM 08/16/2022    9:54 AM  Fall Risk  Falls in the past year?   1 1 1 1   Was there an injury with Fall?   0 0 0 0  Fall Risk Category Calculator   1 2 1 1   (RETIRED) Patient Fall Risk Level High fall risk          Patient at Risk for Falls Due to   Impaired mobility History of fall(s) History of fall(s) History of fall(s)  Fall risk Follow up   Falls evaluation completed Falls evaluation completed Falls evaluation completed Falls evaluation completed    Functional Status Survey:      Vitals:    08/16/22 0941  BP: (!) 154/68  Pulse: 60  Resp: 18  Temp: 98 F (36.7 C)  TempSrc: Temporal  SpO2: 96%  Weight: 135 lb 12.8 oz (61.6 kg)  Height: 5\' 6"  (1.676 m)    Body mass index is 21.92 kg/m. Physical Exam Vitals and nursing note reviewed.  Constitutional:      General: She is not in acute distress.    Appearance: She is not diaphoretic.  HENT:     Head: Normocephalic and  atraumatic.  Neck:     Vascular: No carotid bruit or JVD.  Cardiovascular:     Rate and Rhythm: Regular rhythm. Bradycardia present.     Heart sounds: No murmur heard.    Comments: HR 58 Pulmonary:      Effort: Pulmonary effort is normal. No respiratory distress.     Breath sounds: Normal breath sounds. No wheezing.  Musculoskeletal:     Comments: BLE edema +1  Skin:    General: Skin is warm and dry.  Neurological:     General: No focal deficit present.     Mental Status: She is alert and oriented to person, place, and time. Mental status is at baseline.        Labs reviewed: Recent Labs (within last 365 days)          Recent Labs    01/24/22 1146 01/25/22 0020 01/26/22 0105 02/01/22 0000 05/20/22 1356 05/31/22 0000  NA 141 141 139 144 136 142  K 3.8 3.9 3.8 4.3 3.9 4.3  CL 107 107 106 107 102 105  CO2 26 23 24  23* 26 24*  GLUCOSE 102* 97 107*  --  116*  --   BUN 20 21 13  23* 17 14  CREATININE 0.74 0.81 0.65 0.7 0.99 0.8  CALCIUM 9.1 8.9 8.7* 9.5 8.9 9.6  MG 2.0 1.9 2.0  --   --   --       Recent Labs (within last 365 days)      Recent Labs    01/19/22 1946 05/20/22 1356  AST 30 24  ALT 18 15  ALKPHOS 61 43  BILITOT 0.6 0.5  PROT 8.5* 6.7  ALBUMIN 4.6 3.6      Recent Labs (within last 365 days)        Recent Labs    01/19/22 1946 01/21/22 0018 01/22/22 0545 01/23/22 0038  WBC 8.9 13.1* 8.6 8.7  NEUTROABS 6.4  --   --   --   HGB 15.9* 13.7 12.7 12.3  HCT 47.7* 41.1 38.5 37.9  MCV 92.4 90.5 91.4 92.0  PLT 263 264 229 269      Recent Labs       Lab Results  Component Value Date    TSH 3.663 05/20/2022      Recent Labs       Lab Results  Component Value Date    HGBA1C 5.9 (A) 12/04/2019      Recent Labs       Lab Results  Component Value Date    CHOL 147 05/31/2022    HDL 62 05/31/2022    LDLCALC 62 05/31/2022    TRIG 115 05/31/2022    CHOLHDL 3.1 04/24/2018        Significant Diagnostic Results in last 30 days:  Imaging Results  No results found.     Assessment/Plan   1. Orthostatic hypotension I need more information including manual blood pressures as she has very labile numbers recorded Ordered manual bp/pulse daily  for 3 days then I will f/u Encouraged her to drink oral fluid. Does not appear hypovolemic.   Ordered firmer compression hose, rise slowly, etc   2. Paroxysmal atrial fibrillation (HCC) Rate is still slow , given the half life of amiodarone will give the dose reduction a little more time to work. If still running low at times, consult with cardiology     3. Generalized weakness Improving with therapy but continues to need oversight with ambulation Multifactorial with bp issues,  underlying memory loss,etc   4. Generalized anxiety disorder Consider starting lexapro, Going to monitor her bp through the week and f/u on Thursday. Will consider starting something else and discuss with family     Labs/tests ordered:  NA< recent labs reviewed and appear WNL      This encounter was created in error - please disregard.

## 2022-08-18 NOTE — Progress Notes (Signed)
Expand All Collapse All  Location:  [facility] Room Number: 156A Place of Service:  SNF [phone]) Provider: Mahlon Gammon, MD     Mahlon Gammon, MD   Patient Care Team: Mahlon Gammon, MD as PCP - General (Internal Medicine) [place], [name], MD as PCP - Cardiology (Cardiology) [place], [name], MD as Consulting Physician (Cardiology) Vanessa Barbara, NP as Nurse Practitioner Noel Christmas, MD as Consulting Physician (Urology) Danice Goltz, [place] as Physician Assistant (Cardiology) Ronnald Nian, MD as Consulting Physician (Family Medicine) Noel Christmas, MD as Consulting Physician (Urology)   Extended Emergency Contact Information Primary Emergency Contact: Buboltz,Dr. Alphia Moh, [place] of [place] Home Phone: [phone] Mobile Phone: [phone] Relation: Son Secondary Emergency Contact: [facility] Address: [address]          [place], [place] [address] D[place] of [place] Home Phone: [phone] Relation: Daughter   Code Status:  DNR Goals of care: Advanced Directive information     08/16/2022    9:54 AM  Advanced Directives  Does Patient Have a Medical Advance Directive? Yes  Type of Estate agent of [place];Living will;Out of facility DNR (pink MOST or yellow form)  Does patient want to make changes to medical advance directive? No - Patient declined  Copy of Healthcare Power of Attorney in Chart? No - copy requested            Chief Complaint  Patient presents with   Acute Visit      Patient is being seen for dizziness      HPI:  Pt is a 89 y.o. female seen today for an acute visit for feeling dizzy and anxious.    She currently resides in skilled rehab due to falls and deconditioning. She was treated for a UTI with cefpodoxime and also received therapy due to weakness as she had a covid infection in January.    She is making some gains and is  walking with a walker but still seems unsteady at times and needs assistance with walking.   Nurse reports she feels anxious sometimes and feels her heart is racing but when her HR is checked the rate is regular and normal. She uses xanax which helps. She tried zoloft prior to this rehab admit but it was discontinued due to concern for falls and or other s/e.  This did not turn out to be the case. The zoloft did seem to help with her symptoms of anxiety.    She was also treated during her stay with IVF for orthostatic hypotension. This helped but now she is feeling dizzy at times and went down on her knees over the weekend due weakness   BP lying 150/68 HR 56 Sitting 124/64 58 Standing 108/50 48   She saw cardiology on 4/9 and was felt to be deconditioned. Amiodarone decreased to 100 mg HR running 50-70s  SBP running high at times 150-190/60-90s but is read typically with the automatic machine.    At this time she is not feeling dizzy or having chest pain or sob.    Moving to [place] due to memory loss and functional needs.    Also she was started on flonase and claritin due to nasal congestion/allergies by Dr Chales Abrahams and feels this is helping.      Past Medical History:  Diagnosis Date   Allergic rhinitis  Arthritis      "hands, little in my feet" (06/22/2016)   Candidiasis of vulva and vagina     Cystocele, midline     Disorder of bone and cartilage, unspecified     Fibroids     Hypertension     Large hiatal hernia 06/23/2016   Migraine      "none in the last few years" (06/22/2016)   Osteopenia     Other chronic cystitis     Other sign and symptom in breast     Paroxysmal atrial fibrillation (HCC) 11/30/2012   Pneumonia      "I've had walking pneumonia a couple times"  (06/22/2016)   Pneumonia dx'd 06/15/2016   Rectal incontinence     Stroke (HCC) 2014    hx of mini stroke    TIA (transient ischemic attack) 2015         Past Surgical History:  Procedure Laterality Date    CARDIOVERSION N/A 06/29/2016    Procedure: CARDIOVERSION;  Surgeon: Jake Bathe, MD;  Location: [facility] OR;  Service: Cardiovascular;  Laterality: N/A;   CATARACT EXTRACTION W/ INTRAOCULAR LENS IMPLANT Left     DILATION AND CURETTAGE OF UTERUS       hiatel hernia   08/20/2016   HYSTEROSCOPY WITH D & C   03/16/1999   INSERTION OF MESH N/A 08/20/2016    Procedure: INSERTION OF MESH;  Surgeon: Axel Filler, MD;  Location: [facility] ORS;  Service: General;  Laterality: N/A;   TEE WITHOUT CARDIOVERSION N/A 11/06/2012    Procedure: TRANSESOPHAGEAL ECHOCARDIOGRAM (TEE);  Surgeon: Lewayne Bunting, MD;  Location: [facility] ENDOSCOPY;  Service: Cardiovascular;  Laterality: N/A;   TONSILLECTOMY   1937   VAGINAL HYSTERECTOMY   09/2005    Anterior repair; Removal of urethral caruncle./notes 09/02/2015           Allergies  Allergen Reactions   Lactose Other (See Comments)      Dairy Sensitivity  Dairy Sensitivity      Lisinopril Other (See Comments)      DIZZY   Other        SEASONAL ALLERGIES   Bactrim [Sulfamethoxazole-Trimethoprim] Rash          Outpatient Encounter Medications as of 08/16/2022  Medication Sig   acetaminophen (TYLENOL) 500 MG tablet Take 500 mg by mouth every morning. And one tablet by mouth once a morning.   acetaminophen (TYLENOL) 500 MG tablet Take 500 mg by mouth every 6 (six) hours as needed.   ALPRAZolam (XANAX) 0.25 MG tablet Take 1 tablet (0.25 mg total) by mouth 2 (two) times daily as needed for anxiety.   amiodarone (PACERONE) 100 MG tablet Take 100 mg by mouth every morning.   CALCIUM CARBONATE-VIT D-MIN PO Take 2 tablets by mouth 3 (three) times a week.    CRANBERRY PO Take 450 mg by mouth daily.   ELIQUIS 5 MG TABS tablet TAKE 1 TABLET BY MOUTH TWICE DAILY   estradiol (ESTRACE) 0.1 MG/GM vaginal cream Place 1 Applicatorful vaginally 3 (three) times a week.   Eyelid Cleansers (OCUSOFT EYELID CLEANSING EX) Apply 1 application  topically as needed (dry eyes).   fluticasone  (FLONASE) 50 MCG/ACT nasal spray Place 2 sprays into both nostrils daily.   Glucosamine-Chondroitin (COSAMIN DS PO) Take 1 tablet by mouth 2 (two) times daily.   loperamide (IMODIUM A-D) 2 MG tablet Take 2 mg by mouth as needed for diarrhea or loose stools.   loratadine (CLARITIN) 10 MG tablet Take 10  mg by mouth daily as needed for allergies.    nitrofurantoin, macrocrystal-monohydrate, (MACROBID) 100 MG capsule Take 100 mg by mouth at bedtime.   ondansetron (ZOFRAN) 4 MG tablet Take 1 tablet (4 mg total) by mouth every 8 (eight) hours as needed for nausea or vomiting.   Polyethyl Glycol-Propyl Glycol (SYSTANE OP) Place 1 drop into both eyes 2 (two) times daily as needed (for dry eyes).    pravastatin (PRAVACHOL) 40 MG tablet TAKE 1 TABLET(40 MG) BY MOUTH DAILY    No facility-administered encounter medications on file as of 08/16/2022.      Review of Systems  Constitutional:  Positive for activity change. Negative for appetite change, chills, diaphoresis, fatigue, fever and unexpected weight change.  HENT:  Negative for congestion.   Respiratory:  Negative for cough, shortness of breath and wheezing.   Cardiovascular:  Positive for leg swelling. Negative for chest pain and palpitations.  Gastrointestinal:  Negative for abdominal distention, abdominal pain, constipation and diarrhea.  Genitourinary:  Negative for difficulty urinating and dysuria.  Musculoskeletal:  Positive for gait problem. Negative for arthralgias, back pain, joint swelling and myalgias.  Neurological:  Positive for dizziness and weakness. Negative for tremors, seizures, syncope, facial asymmetry, speech difficulty, light-headedness, numbness and headaches.  Psychiatric/Behavioral:  Negative for agitation, behavioral problems and confusion. The patient is nervous/anxious.        Memory loss          Immunization History  Administered Date(s) Administered   Fluad Quad(high Dose 65+) 01/09/2019, 01/19/2022   Influenza  Split 12/18/2013   Influenza, High Dose Seasonal PF 01/24/2018   Influenza-Unspecified 01/12/2015, 01/13/2016, 01/20/2017, 01/24/2018, 01/06/2021   Moderna Covid-19 Vaccine Bivalent Booster 73yrs & up 01/30/2021   Moderna Sars-Covid-2 Vaccination 05/01/2019, 05/29/2019, 03/04/2020   Pneumococcal Conjugate-13 10/02/2013   Pneumococcal Polysaccharide-23 12/09/2014   Td 05/16/1995, 12/10/2004   Tdap 04/19/2004, 02/20/2016   Zoster Recombinat (Shingrix) 10/29/2016, 03/21/2017   Zoster, Live 02/11/2006        Pertinent  Health Maintenance Due  Topic Date Due   INFLUENZA VACCINE  11/18/2022   DEXA SCAN  Completed   MAMMOGRAM  Discontinued        01/25/2022    7:50 PM 05/11/2022    1:13 PM 06/28/2022    1:49 PM 08/10/2022    2:40 PM 08/16/2022    9:54 AM  Fall Risk  Falls in the past year?   1 1 1 1   Was there an injury with Fall?   0 0 0 0  Fall Risk Category Calculator   1 2 1 1   (RETIRED) Patient Fall Risk Level High fall risk          Patient at Risk for Falls Due to   Impaired mobility History of fall(s) History of fall(s) History of fall(s)  Fall risk Follow up   Falls evaluation completed Falls evaluation completed Falls evaluation completed Falls evaluation completed    Functional Status Survey:      Vitals:    08/16/22 0941  BP: (!) 154/68  Pulse: 60  Resp: 18  Temp: 98 F (36.7 C)  TempSrc: Temporal  SpO2: 96%  Weight: 135 lb 12.8 oz (61.6 kg)  Height: 5\' 6"  (1.676 m)    Body mass index is 21.92 kg/m. Physical Exam Vitals and nursing note reviewed.  Constitutional:      General: She is not in acute distress.    Appearance: She is not diaphoretic.  HENT:     Head: Normocephalic and  atraumatic.  Neck:     Vascular: No carotid bruit or JVD.  Cardiovascular:     Rate and Rhythm: Regular rhythm. Bradycardia present.     Heart sounds: No murmur heard.    Comments: HR 58 Pulmonary:     Effort: Pulmonary effort is normal. No respiratory distress.     Breath  sounds: Normal breath sounds. No wheezing.  Musculoskeletal:     Comments: BLE edema +1  Skin:    General: Skin is warm and dry.  Neurological:     General: No focal deficit present.     Mental Status: She is alert and oriented to person, place, and time. Mental status is at baseline.        Labs reviewed: Recent Labs (within last 365 days)          Recent Labs    01/24/22 1146 01/25/22 0020 01/26/22 0105 02/01/22 0000 05/20/22 1356 05/31/22 0000  NA 141 141 139 144 136 142  K 3.8 3.9 3.8 4.3 3.9 4.3  CL 107 107 106 107 102 105  CO2 26 23 24  23* 26 24*  GLUCOSE 102* 97 107*  --  116*  --   BUN 20 21 13  23* 17 14  CREATININE 0.74 0.81 0.65 0.7 0.99 0.8  CALCIUM 9.1 8.9 8.7* 9.5 8.9 9.6  MG 2.0 1.9 2.0  --   --   --       Recent Labs (within last 365 days)      Recent Labs    01/19/22 1946 05/20/22 1356  AST 30 24  ALT 18 15  ALKPHOS 61 43  BILITOT 0.6 0.5  PROT 8.5* 6.7  ALBUMIN 4.6 3.6      Recent Labs (within last 365 days)        Recent Labs    01/19/22 1946 01/21/22 0018 01/22/22 0545 01/23/22 0038  WBC 8.9 13.1* 8.6 8.7  NEUTROABS 6.4  --   --   --   HGB 15.9* 13.7 12.7 12.3  HCT 47.7* 41.1 38.5 37.9  MCV 92.4 90.5 91.4 92.0  PLT 263 264 229 269      Recent Labs       Lab Results  Component Value Date    TSH 3.663 05/20/2022      Recent Labs       Lab Results  Component Value Date    HGBA1C 5.9 (A) 12/04/2019      Recent Labs       Lab Results  Component Value Date    CHOL 147 05/31/2022    HDL 62 05/31/2022    LDLCALC 62 05/31/2022    TRIG 115 05/31/2022    CHOLHDL 3.1 04/24/2018        Significant Diagnostic Results in last 30 days:  Imaging Results  No results found.     Assessment/Plan   1. Orthostatic hypotension I need more information including manual blood pressures as she has very labile numbers recorded Ordered manual bp/pulse daily for 3 days then I will f/u Encouraged her to drink oral fluid. Does not  appear hypovolemic.   Ordered firmer compression hose, rise slowly, etc   2. Paroxysmal atrial fibrillation (HCC) Rate is still slow , given the half life of amiodarone will give the dose reduction a little more time to work. If still running low at times, consult with cardiology     3. Generalized weakness Improving with therapy but continues to need oversight with ambulation Multifactorial with bp issues,  underlying memory loss,etc   4. Generalized anxiety disorder Consider starting lexapro, Going to monitor her bp through the week and f/u on Thursday. Will consider starting something else and discuss with family     Labs/tests ordered:  NA< recent labs reviewed and appear WNL

## 2022-08-18 NOTE — Progress Notes (Signed)
Expand All Collapse All  Location:  [facility] Room Number: 156A Place of Service:  SNF (31) Provider: Gupta, Anjali L, MD     Gupta, Anjali L, MD   Patient Care Team: Gupta, Anjali L, MD as PCP - General (Internal Medicine) Jordan, [name], MD as PCP - Cardiology (Cardiology) Jordan, [name], MD as Consulting Physician (Cardiology) Davis, Jennaya L, NP as Nurse Practitioner Pace, Maryellen D, MD as Consulting Physician (Urology) Fenton, Clint R, PA as Physician Assistant (Cardiology) Lalonde, John C, MD as Consulting Physician (Family Medicine) Pace, Maryellen D, MD as Consulting Physician (Urology)   Extended Emergency Contact Information Primary Emergency Contact: Nobis,Dr. Gregory          [place], [place] Home Phone: [phone] Mobile Phone: [phone] Relation: Son Secondary Emergency Contact: WEISS,JANICE Address: [address]          [place], [place] [address] [place] Home Phone: [phone] Relation: Daughter   Code Status:  DNR Goals of care: Advanced Directive information     08/16/2022    9:54 AM  Advanced Directives  Does Patient Have a Medical Advance Directive? Yes  Type of Advance Directive Healthcare Power of Attorney;Living will;Out of facility DNR (pink MOST or yellow form)  Does patient want to make changes to medical advance directive? No - Patient declined  Copy of Healthcare Power of Attorney in Chart? No - copy requested            Chief Complaint  Patient presents with  . Acute Visit      Patient is being seen for dizziness      HPI:  Pt is a 89 y.o. female seen today for an acute visit for feeling dizzy and anxious.    She currently resides in skilled rehab due to falls and deconditioning. She was treated for a UTI with cefpodoxime and also received therapy due to weakness as she had a covid infection in January.    She is making some gains and is  walking with a walker but still seems unsteady at times and needs assistance with walking.   Nurse reports she feels anxious sometimes and feels her heart is racing but when her HR is checked the rate is regular and normal. She uses xanax which helps. She tried zoloft prior to this rehab admit but it was discontinued due to concern for falls and or other s/e.  This did not turn out to be the case. The zoloft did seem to help with her symptoms of anxiety.    She was also treated during her stay with IVF for orthostatic hypotension. This helped but now she is feeling dizzy at times and went down on her knees over the weekend due weakness   BP lying 150/68 HR 56 Sitting 124/64 58 Standing 108/50 48   She saw cardiology on 4/9 and was felt to be deconditioned. Amiodarone decreased to 100 mg HR running 50-70s  SBP running high at times 150-190/60-90s but is read typically with the automatic machine.    At this time she is not feeling dizzy or having chest pain or sob.    Moving to [place] due to memory loss and functional needs.    Also she was started on flonase and claritin due to nasal congestion/allergies by Dr Gupta and feels this is helping.      Past Medical History:  Diagnosis Date  . Allergic rhinitis    .   Arthritis      "hands, little in my feet" (06/22/2016)  . Candidiasis of vulva and vagina    . Cystocele, midline    . Disorder of bone and cartilage, unspecified    . Fibroids    . Hypertension    . Large hiatal hernia 06/23/2016  . Migraine      "none in the last few years" (06/22/2016)  . Osteopenia    . Other chronic cystitis    . Other sign and symptom in breast    . Paroxysmal atrial fibrillation (HCC) 11/30/2012  . Pneumonia      "I've had walking pneumonia a couple times"  (06/22/2016)  . Pneumonia dx'd 06/15/2016  . Rectal incontinence    . Stroke (HCC) 2014    hx of mini stroke   . TIA (transient ischemic attack) 2015         Past Surgical History:  Procedure  Laterality Date  . CARDIOVERSION N/A 06/29/2016    Procedure: CARDIOVERSION;  Surgeon: Mark C Skains, MD;  Location: [facility] OR;  Service: Cardiovascular;  Laterality: N/A;  . CATARACT EXTRACTION W/ INTRAOCULAR LENS IMPLANT Left    . DILATION AND CURETTAGE OF UTERUS      . hiatel hernia   08/20/2016  . HYSTEROSCOPY WITH D & C   03/16/1999  . INSERTION OF MESH N/A 08/20/2016    Procedure: INSERTION OF MESH;  Surgeon: Ramirez, Armando, MD;  Location: [facility] ORS;  Service: General;  Laterality: N/A;  . TEE WITHOUT CARDIOVERSION N/A 11/06/2012    Procedure: TRANSESOPHAGEAL ECHOCARDIOGRAM (TEE);  Surgeon: Brian S Crenshaw, MD;  Location: [facility] ENDOSCOPY;  Service: Cardiovascular;  Laterality: N/A;  . TONSILLECTOMY   1937  . VAGINAL HYSTERECTOMY   09/2005    Anterior repair; Removal of urethral caruncle./notes 09/02/2015           Allergies  Allergen Reactions  . Lactose Other (See Comments)      Dairy Sensitivity  Dairy Sensitivity     . Lisinopril Other (See Comments)      DIZZY  . Other        SEASONAL ALLERGIES  . Bactrim [Sulfamethoxazole-Trimethoprim] Rash          Outpatient Encounter Medications as of 08/16/2022  Medication Sig  . acetaminophen (TYLENOL) 500 MG tablet Take 500 mg by mouth every morning. And one tablet by mouth once a morning.  . acetaminophen (TYLENOL) 500 MG tablet Take 500 mg by mouth every 6 (six) hours as needed.  . ALPRAZolam (XANAX) 0.25 MG tablet Take 1 tablet (0.25 mg total) by mouth 2 (two) times daily as needed for anxiety.  . amiodarone (PACERONE) 100 MG tablet Take 100 mg by mouth every morning.  . CALCIUM CARBONATE-VIT D-MIN PO Take 2 tablets by mouth 3 (three) times a week.   . CRANBERRY PO Take 450 mg by mouth daily.  . ELIQUIS 5 MG TABS tablet TAKE 1 TABLET BY MOUTH TWICE DAILY  . estradiol (ESTRACE) 0.1 MG/GM vaginal cream Place 1 Applicatorful vaginally 3 (three) times a week.  . Eyelid Cleansers (OCUSOFT EYELID CLEANSING EX) Apply 1 application   topically as needed (dry eyes).  . fluticasone (FLONASE) 50 MCG/ACT nasal spray Place 2 sprays into both nostrils daily.  . Glucosamine-Chondroitin (COSAMIN DS PO) Take 1 tablet by mouth 2 (two) times daily.  . loperamide (IMODIUM A-D) 2 MG tablet Take 2 mg by mouth as needed for diarrhea or loose stools.  . loratadine (CLARITIN) 10 MG tablet Take 10   mg by mouth daily as needed for allergies.   . nitrofurantoin, macrocrystal-monohydrate, (MACROBID) 100 MG capsule Take 100 mg by mouth at bedtime.  . ondansetron (ZOFRAN) 4 MG tablet Take 1 tablet (4 mg total) by mouth every 8 (eight) hours as needed for nausea or vomiting.  . Polyethyl Glycol-Propyl Glycol (SYSTANE OP) Place 1 drop into both eyes 2 (two) times daily as needed (for dry eyes).   . pravastatin (PRAVACHOL) 40 MG tablet TAKE 1 TABLET(40 MG) BY MOUTH DAILY    No facility-administered encounter medications on file as of 08/16/2022.      Review of Systems  Constitutional:  Positive for activity change. Negative for appetite change, chills, diaphoresis, fatigue, fever and unexpected weight change.  HENT:  Negative for congestion.   Respiratory:  Negative for cough, shortness of breath and wheezing.   Cardiovascular:  Positive for leg swelling. Negative for chest pain and palpitations.  Gastrointestinal:  Negative for abdominal distention, abdominal pain, constipation and diarrhea.  Genitourinary:  Negative for difficulty urinating and dysuria.  Musculoskeletal:  Positive for gait problem. Negative for arthralgias, back pain, joint swelling and myalgias.  Neurological:  Positive for dizziness and weakness. Negative for tremors, seizures, syncope, facial asymmetry, speech difficulty, light-headedness, numbness and headaches.  Psychiatric/Behavioral:  Negative for agitation, behavioral problems and confusion. The patient is nervous/anxious.        Memory loss          Immunization History  Administered Date(s) Administered  . Fluad  Quad(high Dose 65+) 01/09/2019, 01/19/2022  . Influenza Split 12/18/2013  . Influenza, High Dose Seasonal PF 01/24/2018  . Influenza-Unspecified 01/12/2015, 01/13/2016, 01/20/2017, 01/24/2018, 01/06/2021  . Moderna Covid-19 Vaccine Bivalent Booster 18yrs & up 01/30/2021  . Moderna Sars-Covid-2 Vaccination 05/01/2019, 05/29/2019, 03/04/2020  . Pneumococcal Conjugate-13 10/02/2013  . Pneumococcal Polysaccharide-23 12/09/2014  . Td 05/16/1995, 12/10/2004  . Tdap 04/19/2004, 02/20/2016  . Zoster Recombinat (Shingrix) 10/29/2016, 03/21/2017  . Zoster, Live 02/11/2006        Pertinent  Health Maintenance Due  Topic Date Due  . INFLUENZA VACCINE  11/18/2022  . DEXA SCAN  Completed  . MAMMOGRAM  Discontinued        01/25/2022    7:50 PM 05/11/2022    1:13 PM 06/28/2022    1:49 PM 08/10/2022    2:40 PM 08/16/2022    9:54 AM  Fall Risk  Falls in the past year?   1 1 1 1  Was there an injury with Fall?   0 0 0 0  Fall Risk Category Calculator   1 2 1 1  (RETIRED) Patient Fall Risk Level High fall risk          Patient at Risk for Falls Due to   Impaired mobility History of fall(s) History of fall(s) History of fall(s)  Fall risk Follow up   Falls evaluation completed Falls evaluation completed Falls evaluation completed Falls evaluation completed    Functional Status Survey:      Vitals:    08/16/22 0941  BP: (!) 154/68  Pulse: 60  Resp: 18  Temp: 98 F (36.7 C)  TempSrc: Temporal  SpO2: 96%  Weight: 135 lb 12.8 oz (61.6 kg)  Height: 5' 6" (1.676 m)    Body mass index is 21.92 kg/m. Physical Exam Vitals and nursing note reviewed.  Constitutional:      General: She is not in acute distress.    Appearance: She is not diaphoretic.  HENT:     Head: Normocephalic and   atraumatic.  Neck:     Vascular: No carotid bruit or JVD.  Cardiovascular:     Rate and Rhythm: Regular rhythm. Bradycardia present.     Heart sounds: No murmur heard.    Comments: HR 58 Pulmonary:      Effort: Pulmonary effort is normal. No respiratory distress.     Breath sounds: Normal breath sounds. No wheezing.  Musculoskeletal:     Comments: BLE edema +1  Skin:    General: Skin is warm and dry.  Neurological:     General: No focal deficit present.     Mental Status: She is alert and oriented to person, place, and time. Mental status is at baseline.        Labs reviewed: Recent Labs (within last 365 days)          Recent Labs    01/24/22 1146 01/25/22 0020 01/26/22 0105 02/01/22 0000 05/20/22 1356 05/31/22 0000  NA 141 141 139 144 136 142  K 3.8 3.9 3.8 4.3 3.9 4.3  CL 107 107 106 107 102 105  CO2 26 23 24 23* 26 24*  GLUCOSE 102* 97 107*  --  116*  --   BUN 20 21 13 23* 17 14  CREATININE 0.74 0.81 0.65 0.7 0.99 0.8  CALCIUM 9.1 8.9 8.7* 9.5 8.9 9.6  MG 2.0 1.9 2.0  --   --   --       Recent Labs (within last 365 days)      Recent Labs    01/19/22 1946 05/20/22 1356  AST 30 24  ALT 18 15  ALKPHOS 61 43  BILITOT 0.6 0.5  PROT 8.5* 6.7  ALBUMIN 4.6 3.6      Recent Labs (within last 365 days)        Recent Labs    01/19/22 1946 01/21/22 0018 01/22/22 0545 01/23/22 0038  WBC 8.9 13.1* 8.6 8.7  NEUTROABS 6.4  --   --   --   HGB 15.9* 13.7 12.7 12.3  HCT 47.7* 41.1 38.5 37.9  MCV 92.4 90.5 91.4 92.0  PLT 263 264 229 269      Recent Labs       Lab Results  Component Value Date    TSH 3.663 05/20/2022      Recent Labs       Lab Results  Component Value Date    HGBA1C 5.9 (A) 12/04/2019      Recent Labs       Lab Results  Component Value Date    CHOL 147 05/31/2022    HDL 62 05/31/2022    LDLCALC 62 05/31/2022    TRIG 115 05/31/2022    CHOLHDL 3.1 04/24/2018        Significant Diagnostic Results in last 30 days:  Imaging Results  No results found.     Assessment/Plan   1. Orthostatic hypotension I need more information including manual blood pressures as she has very labile numbers recorded Ordered manual bp/pulse daily  for 3 days then I will f/u Encouraged her to drink oral fluid. Does not appear hypovolemic.   Ordered firmer compression hose, rise slowly, etc   2. Paroxysmal atrial fibrillation (HCC) Rate is still slow , given the half life of amiodarone will give the dose reduction a little more time to work. If still running low at times, consult with cardiology     3. Generalized weakness Improving with therapy but continues to need oversight with ambulation Multifactorial with bp issues,   underlying memory loss,etc   4. Generalized anxiety disorder Consider starting lexapro, Going to monitor her bp through the week and f/u on Thursday. Will consider starting something else and discuss with family     Labs/tests ordered:  NA< recent labs reviewed and appear WNL      This encounter was created in error - please disregard. 

## 2022-08-19 ENCOUNTER — Non-Acute Institutional Stay (SKILLED_NURSING_FACILITY): Payer: Medicare Other | Admitting: Adult Health

## 2022-08-19 DIAGNOSIS — F411 Generalized anxiety disorder: Secondary | ICD-10-CM | POA: Diagnosis not present

## 2022-08-19 DIAGNOSIS — I951 Orthostatic hypotension: Secondary | ICD-10-CM | POA: Diagnosis not present

## 2022-08-19 NOTE — Progress Notes (Signed)
This encounter was created in error - please disregard.

## 2022-08-19 NOTE — Addendum Note (Signed)
Addended by: Rudi Heap on: 08/19/2022 09:09 AM   Modules accepted: Orders, Level of Service

## 2022-08-20 ENCOUNTER — Encounter: Payer: Self-pay | Admitting: Adult Health

## 2022-08-20 LAB — BASIC METABOLIC PANEL
BUN: 19 (ref 4–21)
CO2: 23 — AB (ref 13–22)
Chloride: 107 (ref 99–108)
Creatinine: 0.8 (ref 0.5–1.1)
Glucose: 92
Potassium: 4.3 mEq/L (ref 3.5–5.1)
Sodium: 142 (ref 137–147)

## 2022-08-20 LAB — COMPREHENSIVE METABOLIC PANEL
Calcium: 8.6 — AB (ref 8.7–10.7)
eGFR: 72

## 2022-08-20 MED ORDER — ESCITALOPRAM OXALATE 5 MG PO TABS: 5.0000 mg | ORAL_TABLET | Freq: Every day | ORAL | 0 refills | Status: DC

## 2022-08-20 NOTE — Progress Notes (Signed)
Expand All Collapse All  Location:  [facility] Room Number: 156A Place of Service:  SNF [phone]) Provider: Mahlon Gammon, MD     Mahlon Gammon, MD   Patient Care Team: Mahlon Gammon, MD as PCP - General (Internal Medicine) [place], [name], MD as PCP - Cardiology (Cardiology) [place], [name], MD as Consulting Physician (Cardiology) Vanessa Barbara, NP as Nurse Practitioner Noel Christmas, MD as Consulting Physician (Urology) Danice Goltz, [place] as Physician Assistant (Cardiology) Ronnald Nian, MD as Consulting Physician (Family Medicine) Noel Christmas, MD as Consulting Physician (Urology)   Extended Emergency Contact Information Primary Emergency Contact: Berkowitz,Dr. Alphia Moh, [place] Home Phone: [phone] Mobile Phone: [phone] Relation: Son Secondary Emergency Contact: [facility] Address: [address]          [place], [place] [address] [place] Home Phone: [phone] Relation: Daughter   Code Status:  DNR Goals of care: Advanced Directive information     08/16/2022    9:54 AM  Advanced Directives  Does Patient Have a Medical Advance Directive? Yes  Type of Estate agent of [place];Living will;Out of facility DNR (pink MOST or yellow form)  Does patient want to make changes to medical advance directive? No - Patient declined  Copy of Healthcare Power of Attorney in Chart? No - copy requested            Chief Complaint  Patient presents with   Acute Visit      Patient is being seen for dizziness      HPI:  Pt is a 89 y.o. female seen today for an acute visit for orthostatic hypotension.   She currently resides in skilled rehab due to falls and deconditioning. She was treated for a UTI with cefpodoxime and also received therapy due to weakness as she had a covid infection in January.    She is making some gains and is walking  with a walker but still seems unsteady at times and needs assistance with walking.   08/18/22 orthostatic bp and pulse taken each day for three days. Intermittently feels dizzy, not at the time of these vitals.   BP lying 190/70 HR 68 Sitting 140/68 HR 70 Standing 132/64 HR  71   She saw cardiology on 4/9 and was felt to be deconditioned. Amiodarone decreased to 100 mg  When I saw her on 4/29 I ordered a firmer compression hose due to the orthostatic hypotension and to drink more water. She is drinking more and does not appear dry. The hose that were ordered do not appear firmer. She is working with therapy on strategies to increase bp when standing.   Moving to AL due to memory loss and functional needs.    Nurse also reports she continues to be anxious and needs xanax to calm her down, typically at night.       Past Medical History:  Diagnosis Date   Allergic rhinitis     Arthritis      "hands, little in my feet" (06/22/2016)   Candidiasis of vulva and vagina     Cystocele, midline     Disorder of bone and cartilage, unspecified     Fibroids     Hypertension     Large hiatal hernia 06/23/2016   Migraine      "none in the last few years" (06/22/2016)  Osteopenia     Other chronic cystitis     Other sign and symptom in breast     Paroxysmal atrial fibrillation (HCC) 11/30/2012   Pneumonia      "I've had walking pneumonia a couple times"  (06/22/2016)   Pneumonia dx'd 06/15/2016   Rectal incontinence     Stroke (HCC) 2014    hx of mini stroke    TIA (transient ischemic attack) 2015         Past Surgical History:  Procedure Laterality Date   CARDIOVERSION N/A 06/29/2016    Procedure: CARDIOVERSION;  Surgeon: Jake Bathe, MD;  Location: [facility] OR;  Service: Cardiovascular;  Laterality: N/A;   CATARACT EXTRACTION W/ INTRAOCULAR LENS IMPLANT Left     DILATION AND CURETTAGE OF UTERUS       hiatel hernia   08/20/2016   HYSTEROSCOPY WITH D & C   03/16/1999   INSERTION OF MESH N/A  08/20/2016    Procedure: INSERTION OF MESH;  Surgeon: Axel Filler, MD;  Location: [facility] ORS;  Service: General;  Laterality: N/A;   TEE WITHOUT CARDIOVERSION N/A 11/06/2012    Procedure: TRANSESOPHAGEAL ECHOCARDIOGRAM (TEE);  Surgeon: Lewayne Bunting, MD;  Location: [facility] ENDOSCOPY;  Service: Cardiovascular;  Laterality: N/A;   TONSILLECTOMY   1937   VAGINAL HYSTERECTOMY   09/2005    Anterior repair; Removal of urethral caruncle./notes 09/02/2015           Allergies  Allergen Reactions   Lactose Other (See Comments)      Dairy Sensitivity  Dairy Sensitivity      Lisinopril Other (See Comments)      DIZZY   Other        SEASONAL ALLERGIES   Bactrim [Sulfamethoxazole-Trimethoprim] Rash          Outpatient Encounter Medications as of 08/16/2022  Medication Sig   acetaminophen (TYLENOL) 500 MG tablet Take 500 mg by mouth every morning. And one tablet by mouth once a morning.   acetaminophen (TYLENOL) 500 MG tablet Take 500 mg by mouth every 6 (six) hours as needed.   ALPRAZolam (XANAX) 0.25 MG tablet Take 1 tablet (0.25 mg total) by mouth 2 (two) times daily as needed for anxiety.   amiodarone (PACERONE) 100 MG tablet Take 100 mg by mouth every morning.   CALCIUM CARBONATE-VIT D-MIN PO Take 2 tablets by mouth 3 (three) times a week.    CRANBERRY PO Take 450 mg by mouth daily.   ELIQUIS 5 MG TABS tablet TAKE 1 TABLET BY MOUTH TWICE DAILY   estradiol (ESTRACE) 0.1 MG/GM vaginal cream Place 1 Applicatorful vaginally 3 (three) times a week.   Eyelid Cleansers (OCUSOFT EYELID CLEANSING EX) Apply 1 application  topically as needed (dry eyes).   fluticasone (FLONASE) 50 MCG/ACT nasal spray Place 2 sprays into both nostrils daily.   Glucosamine-Chondroitin (COSAMIN DS PO) Take 1 tablet by mouth 2 (two) times daily.   loperamide (IMODIUM A-D) 2 MG tablet Take 2 mg by mouth as needed for diarrhea or loose stools.   loratadine (CLARITIN) 10 MG tablet Take 10 mg by mouth daily as needed for  allergies.    nitrofurantoin, macrocrystal-monohydrate, (MACROBID) 100 MG capsule Take 100 mg by mouth at bedtime.   ondansetron (ZOFRAN) 4 MG tablet Take 1 tablet (4 mg total) by mouth every 8 (eight) hours as needed for nausea or vomiting.   Polyethyl Glycol-Propyl Glycol (SYSTANE OP) Place 1 drop into both eyes 2 (two) times daily as needed (for dry  eyes).    pravastatin (PRAVACHOL) 40 MG tablet TAKE 1 TABLET(40 MG) BY MOUTH DAILY    No facility-administered encounter medications on file as of 08/16/2022.      Review of Systems  Constitutional:  Positive for activity change. Negative for appetite change, chills, diaphoresis, fatigue, fever and unexpected weight change.  HENT:  Negative for congestion.   Respiratory:  Negative for cough, shortness of breath and wheezing.   Cardiovascular:  Positive for leg swelling. Negative for chest pain and palpitations.  Gastrointestinal:  Negative for abdominal distention, abdominal pain, constipation and diarrhea.  Genitourinary:  Negative for difficulty urinating and dysuria.  Musculoskeletal:  Positive for gait problem. Negative for arthralgias, back pain, joint swelling and myalgias.  Neurological:  Positive for dizziness and weakness. Negative for tremors, seizures, syncope, facial asymmetry, speech difficulty, light-headedness, numbness and headaches.  Psychiatric/Behavioral:  Negative for agitation, behavioral problems and confusion. The patient is nervous/anxious.        Memory loss          Immunization History  Administered Date(s) Administered   Fluad Quad(high Dose 65+) 01/09/2019, 01/19/2022   Influenza Split 12/18/2013   Influenza, High Dose Seasonal PF 01/24/2018   Influenza-Unspecified 01/12/2015, 01/13/2016, 01/20/2017, 01/24/2018, 01/06/2021   Moderna Covid-19 Vaccine Bivalent Booster 15yrs & up 01/30/2021   Moderna Sars-Covid-2 Vaccination 05/01/2019, 05/29/2019, 03/04/2020   Pneumococcal Conjugate-13 10/02/2013   Pneumococcal  Polysaccharide-23 12/09/2014   Td 05/16/1995, 12/10/2004   Tdap 04/19/2004, 02/20/2016   Zoster Recombinat (Shingrix) 10/29/2016, 03/21/2017   Zoster, Live 02/11/2006        Pertinent  Health Maintenance Due  Topic Date Due   INFLUENZA VACCINE  11/18/2022   DEXA SCAN  Completed   MAMMOGRAM  Discontinued        01/25/2022    7:50 PM 05/11/2022    1:13 PM 06/28/2022    1:49 PM 08/10/2022    2:40 PM 08/16/2022    9:54 AM  Fall Risk  Falls in the past year?   1 1 1 1   Was there an injury with Fall?   0 0 0 0  Fall Risk Category Calculator   1 2 1 1   (RETIRED) Patient Fall Risk Level High fall risk          Patient at Risk for Falls Due to   Impaired mobility History of fall(s) History of fall(s) History of fall(s)  Fall risk Follow up   Falls evaluation completed Falls evaluation completed Falls evaluation completed Falls evaluation completed    Functional Status Survey:  Physical Exam Vitals and nursing note reviewed.  Constitutional:      General: She is not in acute distress.    Appearance: She is not diaphoretic.  HENT:     Head: Normocephalic and atraumatic.  Neck:     Vascular: No carotid bruit or JVD.  Cardiovascular:     Rate and Rhythm: Regular rhythm. Regular rate present.     Heart sounds: No murmur heard.    Comments:  Pulmonary:     Effort: Pulmonary effort is normal. No respiratory distress.     Breath sounds: Normal breath sounds. No wheezing.  Musculoskeletal:     Comments: BLE edema +1  Skin:    General: Skin is warm and dry.  Neurological:     General: No focal deficit present.     Mental Status: She is alert and oriented to person, place, and time. Mental status is at baseline.  Labs reviewed: Recent Labs (within last 365 days)          Recent Labs    01/24/22 1146 01/25/22 0020 01/26/22 0105 02/01/22 0000 05/20/22 1356 05/31/22 0000  NA 141 141 139 144 136 142  K 3.8 3.9 3.8 4.3 3.9 4.3  CL 107 107 106 107 102 105  CO2 26 23 24   23* 26 24*  GLUCOSE 102* 97 107*  --  116*  --   BUN 20 21 13  23* 17 14  CREATININE 0.74 0.81 0.65 0.7 0.99 0.8  CALCIUM 9.1 8.9 8.7* 9.5 8.9 9.6  MG 2.0 1.9 2.0  --   --   --       Recent Labs (within last 365 days)      Recent Labs    01/19/22 1946 05/20/22 1356  AST 30 24  ALT 18 15  ALKPHOS 61 43  BILITOT 0.6 0.5  PROT 8.5* 6.7  ALBUMIN 4.6 3.6      Recent Labs (within last 365 days)        Recent Labs    01/19/22 1946 01/21/22 0018 01/22/22 0545 01/23/22 0038  WBC 8.9 13.1* 8.6 8.7  NEUTROABS 6.4  --   --   --   HGB 15.9* 13.7 12.7 12.3  HCT 47.7* 41.1 38.5 37.9  MCV 92.4 90.5 91.4 92.0  PLT 263 264 229 269      Recent Labs       Lab Results  Component Value Date    TSH 3.663 05/20/2022      Recent Labs       Lab Results  Component Value Date    HGBA1C 5.9 (A) 12/04/2019      Recent Labs       Lab Results  Component Value Date    CHOL 147 05/31/2022    HDL 62 05/31/2022    LDLCALC 62 05/31/2022    TRIG 115 05/31/2022    CHOLHDL 3.1 04/24/2018        Significant Diagnostic Results in last 30 days:  Imaging Results  No results found.     Assessment/Plan   1. Orthostatic hypotension Still not in the right hose so I could not evaluate if the strategy helped. Staff will order proper hose and we will follow up. Repeat BMP as well.  Could consider midodrine low dose  2. Paroxysmal atrial fibrillation (HCC) Rate in the 60s now on amiodarone.  On Eliquis for CVA risk reduction   3. Generalized weakness Improving with therapy but continues to need oversight with ambulation Multifactorial with bp issues, underlying memory loss,etc   4. Generalized anxiety disorder Start lexapro 5 mg qd x 2 weeks then increase to 10 mg qd      Labs/tests ordered:  BMP in am and then again in two weeks

## 2022-08-26 ENCOUNTER — Encounter: Payer: Self-pay | Admitting: Adult Health

## 2022-08-26 ENCOUNTER — Non-Acute Institutional Stay (SKILLED_NURSING_FACILITY): Payer: Medicare Other | Admitting: Adult Health

## 2022-08-26 DIAGNOSIS — I951 Orthostatic hypotension: Secondary | ICD-10-CM

## 2022-08-26 DIAGNOSIS — F411 Generalized anxiety disorder: Secondary | ICD-10-CM

## 2022-08-26 DIAGNOSIS — R2681 Unsteadiness on feet: Secondary | ICD-10-CM | POA: Diagnosis not present

## 2022-08-26 MED ORDER — MIDODRINE HCL 2.5 MG PO TABS: 2.5000 mg | ORAL_TABLET | Freq: Two times a day (BID) | ORAL | 0 refills | Status: DC

## 2022-08-26 NOTE — Progress Notes (Signed)
Location:  [facility] Room Number: 156A Place of Service:  SNF [phone]) Provider: Tamsen Roers, MD  Patient Care Team: Mahlon Gammon, MD as PCP - General (Internal Medicine) [place], [name], MD as PCP - Cardiology (Cardiology) [place], [name], MD as Consulting Physician (Cardiology) Vanessa Barbara, NP as Nurse Practitioner Noel Christmas, MD as Consulting Physician (Urology) Danice Goltz, [place] as Physician Assistant (Cardiology) Ronnald Nian, MD as Consulting Physician (Family Medicine) Noel Christmas, MD as Consulting Physician (Urology)  Extended Emergency Contact Information Primary Emergency Contact: Tsang,Dr. Alphia Moh, [place] D[place] of [place] Home Phone: [phone] Mobile Phone: [phone] Relation: Son Secondary Emergency Contact: [facility] Address: [address]          [place], [place] [address] Darden Amber of [place] Home Phone: [phone] Relation: Daughter  Code Status:  DNR Goals of care: Advanced Directive information    08/26/2022    3:55 PM  Advanced Directives  Does Patient Have a Medical Advance Directive? Yes  Type of Estate agent of [place];Living will;Out of facility DNR (pink MOST or yellow form)  Does patient want to make changes to medical advance directive? No - Patient declined  Copy of Healthcare Power of Attorney in Chart? No - copy requested     Chief Complaint  Patient presents with   Acute Visit    Orthostatic Hypertension     HPI:  Pt is a 89 y.o. female seen today for an acute visit for orthostatic hypotension.  She resides in rehab due to falls and weakness. Had covid in Jan and improved but since March has been declining.   Orthostatic bp and pulse 136/72 lying 107/62 71 sitting 84/52 74 standing.   Did not feel dizzy when vitals were taken but intermittently does report dizziness in the morning when  standing. She has tried compression hose and this did help initially.  Nurse reports she had a hallucination of hearing Christmas music last evening. Started lexapro 08/19/22 for anxiety.   BMP done WNL 08/20/22. Repeat due in two weeks since starting Lexapro.  In the therapy meeting it was reported that she still needs assistance with walking to be safe and is a fall risk. Balance scores are declining.  She has memory loss that is progressing as well. She was hoping to transfer to AL but now with gait issues she may need skilled care.  She does have chronic headaches and uses tylenol which helps. No headache or focal deficit are reported for this visit.   Last CT of the head 08/22/19 which was done for dizziness showed IMPRESSION: 1. No acute abnormality. 2. Stable mild atrophy and mild chronic white matter ischemic changes.   Past Medical History:  Diagnosis Date   Allergic rhinitis    Arthritis    "hands, little in my feet" (06/22/2016)   Candidiasis of vulva and vagina    Cystocele, midline    Disorder of bone and cartilage, unspecified    Fibroids    Hypertension    Large hiatal hernia 06/23/2016   Migraine    "none in the last few years" (06/22/2016)   Osteopenia    Other chronic cystitis    Other sign and symptom in breast    Paroxysmal atrial fibrillation (HCC) 11/30/2012   Pneumonia    "I've had walking pneumonia a couple times"  (06/22/2016)   Pneumonia dx'd  06/15/2016   Rectal incontinence    Stroke (HCC) 2014   hx of mini stroke    TIA (transient ischemic attack) 2015   Past Surgical History:  Procedure Laterality Date   CARDIOVERSION N/A 06/29/2016   Procedure: CARDIOVERSION;  Surgeon: Jake Bathe, MD;  Location: [facility] OR;  Service: Cardiovascular;  Laterality: N/A;   CATARACT EXTRACTION W/ INTRAOCULAR LENS IMPLANT Left    DILATION AND CURETTAGE OF UTERUS     hiatel hernia  08/20/2016   HYSTEROSCOPY WITH D & C  03/16/1999   INSERTION OF MESH N/A 08/20/2016    Procedure: INSERTION OF MESH;  Surgeon: Axel Filler, MD;  Location: [facility] ORS;  Service: General;  Laterality: N/A;   TEE WITHOUT CARDIOVERSION N/A 11/06/2012   Procedure: TRANSESOPHAGEAL ECHOCARDIOGRAM (TEE);  Surgeon: Lewayne Bunting, MD;  Location: [facility] ENDOSCOPY;  Service: Cardiovascular;  Laterality: N/A;   TONSILLECTOMY  1937   VAGINAL HYSTERECTOMY  09/2005   Anterior repair; Removal of urethral caruncle./notes 09/02/2015    Allergies  Allergen Reactions   Lactose Other (See Comments)    Dairy Sensitivity  Dairy Sensitivity     Lisinopril Other (See Comments)    DIZZY   Other     SEASONAL ALLERGIES   Bactrim [Sulfamethoxazole-Trimethoprim] Rash    Outpatient Encounter Medications as of 08/26/2022  Medication Sig   acetaminophen (TYLENOL) 500 MG tablet Take 500 mg by mouth every morning. And one tablet by mouth once a morning.   acetaminophen (TYLENOL) 500 MG tablet Take 500 mg by mouth every 6 (six) hours as needed.   ALPRAZolam (XANAX) 0.25 MG tablet Take 1 tablet (0.25 mg total) by mouth 2 (two) times daily as needed for anxiety.   amiodarone (PACERONE) 100 MG tablet Take 100 mg by mouth every morning.   CALCIUM CARBONATE-VIT D-MIN PO Take 2 tablets by mouth 3 (three) times a week.    CRANBERRY PO Take 450 mg by mouth daily.   ELIQUIS 5 MG TABS tablet TAKE 1 TABLET BY MOUTH TWICE DAILY   escitalopram (LEXAPRO) 5 MG tablet Take 1 tablet (5 mg total) by mouth daily. 5 mg qd x 2 weeks then increase to 10 mg   estradiol (ESTRACE) 0.1 MG/GM vaginal cream Place 1 Applicatorful vaginally 3 (three) times a week.   Eyelid Cleansers (OCUSOFT EYELID CLEANSING EX) Apply 1 application  topically as needed (dry eyes).   fluticasone (FLONASE) 50 MCG/ACT nasal spray Place 2 sprays into both nostrils daily.   Glucosamine-Chondroitin (COSAMIN DS PO) Take 1 tablet by mouth 2 (two) times daily.   loperamide (IMODIUM A-D) 2 MG tablet Take 2 mg by mouth as needed for diarrhea or loose stools.    loratadine (CLARITIN) 10 MG tablet Take 10 mg by mouth daily as needed for allergies.    nitrofurantoin, macrocrystal-monohydrate, (MACROBID) 100 MG capsule Take 100 mg by mouth at bedtime.   ondansetron (ZOFRAN) 4 MG tablet Take 1 tablet (4 mg total) by mouth every 8 (eight) hours as needed for nausea or vomiting.   Polyethyl Glycol-Propyl Glycol (SYSTANE OP) Place 1 drop into both eyes 2 (two) times daily as needed (for dry eyes).    pravastatin (PRAVACHOL) 40 MG tablet TAKE 1 TABLET(40 MG) BY MOUTH DAILY   No facility-administered encounter medications on file as of 08/26/2022.    Review of Systems  Constitutional:  Negative for activity change, appetite change, chills, diaphoresis, fatigue, fever and unexpected weight change.  HENT:  Negative for congestion.   Respiratory:  Negative  for cough, shortness of breath and wheezing.   Cardiovascular:  Negative for chest pain, palpitations and leg swelling.  Gastrointestinal:  Negative for abdominal distention, abdominal pain, constipation and diarrhea.  Genitourinary:  Negative for difficulty urinating and dysuria.  Musculoskeletal:  Negative for arthralgias, back pain, gait problem, joint swelling and myalgias.  Neurological:  Negative for dizziness, tremors, seizures, syncope, facial asymmetry, speech difficulty, weakness, light-headedness, numbness and headaches.  Psychiatric/Behavioral:  Negative for agitation, behavioral problems and confusion.    Immunization History  Administered Date(s) Administered   Fluad Quad(high Dose 65+) 01/09/2019, 01/19/2022   Influenza Split 12/18/2013   Influenza, High Dose Seasonal PF 01/24/2018   Influenza-Unspecified 01/12/2015, 01/13/2016, 01/20/2017, 01/24/2018, 01/06/2021   Moderna Covid-19 Vaccine Bivalent Booster 51yrs & up 01/30/2021   Moderna Sars-Covid-2 Vaccination 05/01/2019, 05/29/2019, 03/04/2020   Pneumococcal Conjugate-13 10/02/2013   Pneumococcal Polysaccharide-23 12/09/2014   Td  05/16/1995, 12/10/2004   Tdap 04/19/2004, 02/20/2016   Zoster Recombinat (Shingrix) 10/29/2016, 03/21/2017   Zoster, Live 02/11/2006   Pertinent  Health Maintenance Due  Topic Date Due   INFLUENZA VACCINE  11/18/2022   DEXA SCAN  Completed   MAMMOGRAM  Discontinued      01/25/2022    7:50 PM 05/11/2022    1:13 PM 06/28/2022    1:49 PM 08/10/2022    2:40 PM 08/16/2022    9:54 AM  Fall Risk  Falls in the past year?  1 1 1 1   Was there an injury with Fall?  0 0 0 0  Fall Risk Category Calculator  1 2 1 1   (RETIRED) Patient Fall Risk Level High fall risk      Patient at Risk for Falls Due to  Impaired mobility History of fall(s) History of fall(s) History of fall(s)  Fall risk Follow up  Falls evaluation completed Falls evaluation completed Falls evaluation completed Falls evaluation completed   Functional Status Survey:    Vitals:   08/26/22 1546  BP: 136/67  Pulse: 71  Resp: 18  Temp: 98.3 F (36.8 C)  TempSrc: Temporal  SpO2: 94%  Weight: 136 lb 6.4 oz (61.9 kg)  Height: 5\' 6"  (1.676 m)   Body mass index is 22.02 kg/m. Physical Exam Vitals and nursing note reviewed.  Constitutional:      General: She is not in acute distress.    Appearance: She is not diaphoretic.  HENT:     Head: Normocephalic and atraumatic.  Eyes:     Conjunctiva/sclera: Conjunctivae normal.     Pupils: Pupils are equal, round, and reactive to light.  Neck:     Vascular: No JVD.  Cardiovascular:     Rate and Rhythm: Normal rate and regular rhythm.     Pulses:          Dorsalis pedis pulses are 1+ on the right side and 1+ on the left side.     Heart sounds: No murmur heard. Pulmonary:     Effort: Pulmonary effort is normal. No respiratory distress.     Breath sounds: Normal breath sounds. No wheezing.  Musculoskeletal:     Comments: BLE edema +1  Feet:     Right foot:     Protective Sensation: 8 sites tested.  8 sites sensed.     Left foot:     Protective Sensation: 8 sites tested.  8  sites sensed.  Skin:    General: Skin is warm and dry.  Neurological:     General: No focal deficit present.  Mental Status: She is alert. Mental status is at baseline.     Cranial Nerves: No cranial nerve deficit.  Psychiatric:        Mood and Affect: Mood normal.     Labs reviewed: Recent Labs    01/24/22 1146 01/25/22 0020 01/26/22 0105 02/01/22 0000 05/20/22 1356 05/31/22 0000  NA 141 141 139 144 136 142  K 3.8 3.9 3.8 4.3 3.9 4.3  CL 107 107 106 107 102 105  CO2 26 23 24  23* 26 24*  GLUCOSE 102* 97 107*  --  116*  --   BUN 20 21 13  23* 17 14  CREATININE 0.74 0.81 0.65 0.7 0.99 0.8  CALCIUM 9.1 8.9 8.7* 9.5 8.9 9.6  MG 2.0 1.9 2.0  --   --   --    Recent Labs    01/19/22 1946 05/20/22 1356  AST 30 24  ALT 18 15  ALKPHOS 61 43  BILITOT 0.6 0.5  PROT 8.5* 6.7  ALBUMIN 4.6 3.6   Recent Labs    01/19/22 1946 01/21/22 0018 01/22/22 0545 01/23/22 0038  WBC 8.9 13.1* 8.6 8.7  NEUTROABS 6.4  --   --   --   HGB 15.9* 13.7 12.7 12.3  HCT 47.7* 41.1 38.5 37.9  MCV 92.4 90.5 91.4 92.0  PLT 263 264 229 269   Lab Results  Component Value Date   TSH 3.663 05/20/2022   Lab Results  Component Value Date   HGBA1C 5.9 (A) 12/04/2019   Lab Results  Component Value Date   CHOL 147 05/31/2022   HDL 62 05/31/2022   LDLCALC 62 05/31/2022   TRIG 115 05/31/2022   CHOLHDL 3.1 04/24/2018    Significant Diagnostic Results in last 30 days:  No results found.  Assessment/Plan  1. Orthostatic hypotension Add midodrine 2.5 mg bid hold for SBP<150 Rise slowly Continue compression hose  2. Gait instability Due to #1 and also has underlying progressive dementia Working with therapy  May need to move to skilled care.   3. Generalized anxiety disorder Just started on lexapro and did have one hallucination. If this continues will d/c but I would like to give it a chance to work  Had recent f/u UA after treatment of UTI and it returned WNL   Family/ staff  Communication: discussed with resident, nurse, and her son in law at bedside.   Labs/tests ordered:  BMP due in one week monitoring while on SSRI

## 2022-08-28 ENCOUNTER — Other Ambulatory Visit: Payer: Self-pay | Admitting: Internal Medicine

## 2022-08-28 DIAGNOSIS — E785 Hyperlipidemia, unspecified: Secondary | ICD-10-CM

## 2022-08-29 ENCOUNTER — Other Ambulatory Visit: Payer: Self-pay | Admitting: Internal Medicine

## 2022-08-29 DIAGNOSIS — E785 Hyperlipidemia, unspecified: Secondary | ICD-10-CM

## 2022-08-31 ENCOUNTER — Encounter: Payer: Self-pay | Admitting: Orthopedic Surgery

## 2022-08-31 ENCOUNTER — Non-Acute Institutional Stay (SKILLED_NURSING_FACILITY): Payer: Medicare Other | Admitting: Orthopedic Surgery

## 2022-08-31 DIAGNOSIS — R2681 Unsteadiness on feet: Secondary | ICD-10-CM

## 2022-08-31 DIAGNOSIS — I951 Orthostatic hypotension: Secondary | ICD-10-CM | POA: Diagnosis not present

## 2022-08-31 NOTE — Progress Notes (Signed)
This encounter was created in error - please disregard.

## 2022-08-31 NOTE — Progress Notes (Signed)
[facility] Room Number: 156-A Place of Service:  SNF [phone]) Provider:  Hazle Nordmann, NP  PCP: Mahlon Gammon, MD  Patient Care Team: Mahlon Gammon, MD as PCP - General (Internal Medicine) [place], [name], MD as PCP - Cardiology (Cardiology) [place], [name], MD as Consulting Physician (Cardiology) Vanessa Barbara, NP as Nurse Practitioner Noel Christmas, MD as Consulting Physician (Urology) Danice Goltz, [place] as Physician Assistant (Cardiology) Ronnald Nian, MD as Consulting Physician (Family Medicine) Noel Christmas, MD as Consulting Physician (Urology)  Extended Emergency Contact Information Primary Emergency Contact: Prather,Dr. Alphia Moh, [place] of [place] Home Phone: [phone] Mobile Phone: [phone] Relation: Son Secondary Emergency Contact: [facility] Address: [address]          [place], [place] [address] Darden [place] of [place] Home Phone: [phone] Relation: Daughter  Code Status:  DNR Goals of care: Advanced Directive information    08/31/2022    2:15 PM  Advanced Directives  Does Patient Have a Medical Advance Directive? Yes  Type of Estate agent of [place];Living will;Out of facility DNR (pink MOST or yellow form)  Does patient want to make changes to medical advance directive? No - Patient declined  Copy of Healthcare Power of Attorney in Chart? No - copy requested     Chief Complaint  Patient presents with   Acute Visit    Orthostatic Hypotension.    HPI:  [name] is a 89 y.o. female seen today for an acute visit due to ongoing orthostatic hypotension.   She currently resides on the rehabilitation unit at [facility]. PMH: PAF, h/o TIA, HTN, HLD, atherosclerosis, hiatal hernia, osteopenia, anxiety and weakness.   Recent orthostatic blood pressures: Lying down 175/72, sitting 131/64, standing 89/50. 05/09 she was started on midodrine  2.5 mg BID. She continues to have intermittent dizziness. Nursing reports poor po intake. 05/13 she drank 300 cc per nursing. BUN/creat 19/0.8 08/20/2022. 04/16 she was given IV fluids for orthostatic hypotension. Her blood pressures did improve for awhile. No recent falls or injuries. She continues to work with [name] due to generalized weakness. She will discharge to skilled nursing when condition improves.        Past Medical History:  Diagnosis Date   Allergic rhinitis    Arthritis    "hands, little in my feet" (06/22/2016)   Candidiasis of vulva and vagina    Cystocele, midline    Disorder of bone and cartilage, unspecified    Fibroids    Hypertension    Large hiatal hernia 06/23/2016   Migraine    "none in the last few years" (06/22/2016)   Osteopenia    Other chronic cystitis    Other sign and symptom in breast    Paroxysmal atrial fibrillation (HCC) 11/30/2012   Pneumonia    "I've had walking pneumonia a couple times"  (06/22/2016)   Pneumonia dx'd 06/15/2016   Rectal incontinence    Stroke (HCC) 2014   hx of mini stroke    TIA (transient ischemic attack) 2015   Past Surgical History:  Procedure Laterality Date   CARDIOVERSION N/A 06/29/2016   Procedure: CARDIOVERSION;  Surgeon: Jake Bathe, MD;  Location: [facility] OR;  Service: Cardiovascular;  Laterality: N/A;   CATARACT EXTRACTION W/ INTRAOCULAR LENS IMPLANT Left    DILATION AND CURETTAGE OF UTERUS     hiatel hernia  08/20/2016  HYSTEROSCOPY WITH D & C  03/16/1999   INSERTION OF MESH N/A 08/20/2016   Procedure: INSERTION OF MESH;  Surgeon: Axel Filler, MD;  Location: [facility] ORS;  Service: General;  Laterality: N/A;   TEE WITHOUT CARDIOVERSION N/A 11/06/2012   Procedure: TRANSESOPHAGEAL ECHOCARDIOGRAM (TEE);  Surgeon: Lewayne Bunting, MD;  Location: [facility] ENDOSCOPY;  Service: Cardiovascular;  Laterality: N/A;   TONSILLECTOMY  1937   VAGINAL HYSTERECTOMY  09/2005   Anterior repair; Removal of urethral caruncle./notes 09/02/2015     Allergies  Allergen Reactions   Lactose Other (See Comments)    Dairy Sensitivity  Dairy Sensitivity     Lisinopril Other (See Comments)    DIZZY   Other     SEASONAL ALLERGIES   Bactrim [Sulfamethoxazole-Trimethoprim] Rash    Allergies as of 08/31/2022       Reactions   Lactose Other (See Comments)   Dairy Sensitivity  Dairy Sensitivity    Lisinopril Other (See Comments)   DIZZY   Other    SEASONAL ALLERGIES   Bactrim [sulfamethoxazole-trimethoprim] Rash        Medication List        Accurate as of Aug 31, 2022  2:16 PM. If you have any questions, ask your nurse or doctor.          acetaminophen 500 MG tablet Commonly known as: TYLENOL Take 500 mg by mouth every morning. And one tablet by mouth once a morning.   acetaminophen 500 MG tablet Commonly known as: TYLENOL Take 500 mg by mouth every 6 (six) hours as needed.   ALPRAZolam 0.25 MG tablet Commonly known as: XANAX Take 1 tablet (0.25 mg total) by mouth 2 (two) times daily as needed for anxiety.   amiodarone 100 MG tablet Commonly known as: PACERONE Take 100 mg by mouth every morning.   CALCIUM CARBONATE-VIT D-MIN PO Take 2 tablets by mouth 3 (three) times a week.   CRANBERRY PO Take 450 mg by mouth daily.   Eliquis 5 MG Tabs tablet Generic drug: apixaban TAKE 1 TABLET BY MOUTH TWICE DAILY   escitalopram 5 MG tablet Commonly known as: Lexapro Take 1 tablet (5 mg total) by mouth daily. 5 mg qd x 2 weeks then increase to 10 mg   estradiol 0.1 MG/GM vaginal cream Commonly known as: ESTRACE Place 1 Applicatorful vaginally 3 (three) times a week.   fluticasone 50 MCG/ACT nasal spray Commonly known as: FLONASE Place 2 sprays into both nostrils daily.   glucosamine-chondroitin 500-400 MG tablet Take 1 tablet by mouth in the morning and at bedtime.   loperamide 2 MG tablet Commonly known as: IMODIUM A-D Take 2 mg by mouth as needed for diarrhea or loose stools.   loratadine 10 MG  tablet Commonly known as: CLARITIN Take 10 mg by mouth daily as needed for allergies.   midodrine 2.5 MG tablet Commonly known as: PROAMATINE Take 1 tablet (2.5 mg total) by mouth 2 (two) times daily with a meal. Hold if SBP sitting is >150   nitrofurantoin (macrocrystal-monohydrate) 100 MG capsule Commonly known as: MACROBID Take 100 mg by mouth at bedtime.   OCUSOFT EYELID CLEANSING EX Apply 1 application  topically as needed (dry eyes).   ondansetron 4 MG tablet Commonly known as: Zofran Take 1 tablet (4 mg total) by mouth every 8 (eight) hours as needed for nausea or vomiting.   pravastatin 40 MG tablet Commonly known as: PRAVACHOL TAKE 1 TABLET(40 MG) BY MOUTH DAILY   SYSTANE OP Place 1  drop into both eyes 2 (two) times daily as needed (for dry eyes).        Review of Systems  Constitutional:  Positive for activity change. Negative for appetite change and fever.  HENT:  Negative for rhinorrhea and trouble swallowing.   Respiratory:  Negative for cough, shortness of breath and wheezing.   Cardiovascular:  Positive for leg swelling. Negative for chest pain.  Gastrointestinal:  Negative for abdominal pain.  Genitourinary:  Negative for dysuria.  Musculoskeletal:  Positive for gait problem.  Skin:  Negative for wound.  Neurological:  Positive for dizziness, weakness and light-headedness.  Psychiatric/Behavioral:  Positive for confusion. Negative for dysphoric mood. The patient is not nervous/anxious.     Immunization History  Administered Date(s) Administered   Fluad Quad(high Dose 65+) 01/09/2019, 01/19/2022   Influenza Split 12/18/2013   Influenza, High Dose Seasonal PF 01/24/2018   Influenza-Unspecified 01/12/2015, 01/13/2016, 01/20/2017, 01/24/2018, 01/06/2021   Moderna Covid-19 Vaccine Bivalent Booster 6yrs & up 01/30/2021   Moderna Sars-Covid-2 Vaccination 05/01/2019, 05/29/2019, 03/04/2020   Pneumococcal Conjugate-13 10/02/2013   Pneumococcal  Polysaccharide-23 12/09/2014   Td 05/16/1995, 12/10/2004   Tdap 04/19/2004, 02/20/2016   Zoster Recombinat (Shingrix) 10/29/2016, 03/21/2017   Zoster, Live 02/11/2006   Pertinent  Health Maintenance Due  Topic Date Due   INFLUENZA VACCINE  11/18/2022   DEXA SCAN  Completed   MAMMOGRAM  Discontinued      01/25/2022    7:50 PM 05/11/2022    1:13 PM 06/28/2022    1:49 PM 08/10/2022    2:40 PM 08/16/2022    9:54 AM  Fall Risk  Falls in the past year?  1 1 1 1   Was there an injury with Fall?  0 0 0 0  Fall Risk Category Calculator  1 2 1 1   (RETIRED) Patient Fall Risk Level High fall risk      Patient at Risk for Falls Due to  Impaired mobility History of fall(s) History of fall(s) History of fall(s)  Fall risk Follow up  Falls evaluation completed Falls evaluation completed Falls evaluation completed Falls evaluation completed   Functional Status Survey:    Vitals:   08/31/22 1322  BP: (!) 145/67  Pulse: 65  Resp: 18  Temp: 98.2 F (36.8 C)  SpO2: 96%  Weight: 138 lb (62.6 kg)  Height: 5\' 6"  (1.676 m)   Body mass index is 22.27 kg/m. Physical Exam Vitals reviewed.  Constitutional:      General: She is not in acute distress. HENT:     Head: Normocephalic.  Eyes:     General:        Right eye: No discharge.        Left eye: No discharge.     Extraocular Movements: Extraocular movements intact.     Pupils: Pupils are equal, round, and reactive to light.  Cardiovascular:     Rate and Rhythm: Normal rate. Rhythm irregular.     Pulses: Normal pulses.     Heart sounds: Normal heart sounds.  Pulmonary:     Effort: Pulmonary effort is normal. No respiratory distress.     Breath sounds: Normal breath sounds. No wheezing.  Abdominal:     General: Bowel sounds are normal.     Palpations: Abdomen is soft.  Musculoskeletal:     Cervical back: Neck supple.     Right lower leg: Edema present.     Left lower leg: Edema present.     Comments: Non pitting, ted hose on  Skin:    General: Skin is warm.     Capillary Refill: Capillary refill takes less than 2 seconds.  Neurological:     General: No focal deficit present.     Mental Status: She is alert. Mental status is at baseline.     Motor: Weakness present.     Gait: Gait abnormal.     Comments: Rolator, wheelchair  Psychiatric:        Mood and Affect: Mood normal.     Labs reviewed: Recent Labs    01/24/22 1146 01/25/22 0020 01/26/22 0105 02/01/22 0000 05/20/22 1356 05/31/22 0000 08/20/22 0000  NA 141 141 139   < > 136 142 142  K 3.8 3.9 3.8   < > 3.9 4.3 4.3  CL 107 107 106   < > 102 105 107  CO2 26 23 24    < > 26 24* 23*  GLUCOSE 102* 97 107*  --  116*  --   --   BUN 20 21 13    < > 17 14 19   CREATININE 0.74 0.81 0.65   < > 0.99 0.8 0.8  CALCIUM 9.1 8.9 8.7*   < > 8.9 9.6 8.6*  MG 2.0 1.9 2.0  --   --   --   --    < > = values in this interval not displayed.   Recent Labs    01/19/22 1946 05/20/22 1356  AST 30 24  ALT 18 15  ALKPHOS 61 43  BILITOT 0.6 0.5  PROT 8.5* 6.7  ALBUMIN 4.6 3.6   Recent Labs    01/19/22 1946 01/21/22 0018 01/22/22 0545 01/23/22 0038  WBC 8.9 13.1* 8.6 8.7  NEUTROABS 6.4  --   --   --   HGB 15.9* 13.7 12.7 12.3  HCT 47.7* 41.1 38.5 37.9  MCV 92.4 90.5 91.4 92.0  PLT 263 264 229 269   Lab Results  Component Value Date   TSH 3.663 05/20/2022   Lab Results  Component Value Date   HGBA1C 5.9 (A) 12/04/2019   Lab Results  Component Value Date   CHOL 147 05/31/2022   HDL 62 05/31/2022   LDLCALC 62 05/31/2022   TRIG 115 05/31/2022   CHOLHDL 3.1 04/24/2018    Significant Diagnostic Results in last 30 days:  No results found.  Assessment/Plan 1. Orthostatic hypotension - Lying down 175/72, sitting 131/64, standing 89/50 08/30/2022 - intermittent dizziness - 05/09 started on midodrine - poor po intake> orthostatics improved with IV fluids - do not recommend IV fluids - encourage oral intake with water> advised to number drinks  during day - orthostatic pressures 05/16 & 05/17 - consider IV fluid bolus if not improved with po intake  2. Gait instability - ongoing - cont [name]/OT - family/patient plans to moved to skilled when appropriate     Family/ staff Communication: plan discussed with patient and nurse  Labs/tests ordered:  none

## 2022-09-02 LAB — COMPREHENSIVE METABOLIC PANEL
Calcium: 9.1 (ref 8.7–10.7)
eGFR: 67

## 2022-09-02 LAB — BASIC METABOLIC PANEL
BUN: 16 (ref 4–21)
CO2: 27 — AB (ref 13–22)
Chloride: 100 (ref 99–108)
Creatinine: 0.8 (ref 0.5–1.1)
Glucose: 88
Potassium: 4.5 mEq/L (ref 3.5–5.1)
Sodium: 137 (ref 137–147)

## 2022-09-16 ENCOUNTER — Other Ambulatory Visit: Payer: Self-pay | Admitting: Adult Health

## 2022-09-16 DIAGNOSIS — F419 Anxiety disorder, unspecified: Secondary | ICD-10-CM

## 2022-09-16 MED ORDER — ALPRAZOLAM 0.25 MG PO TABS
0.2500 mg | ORAL_TABLET | Freq: Two times a day (BID) | ORAL | 5 refills | Status: DC | PRN
Start: 2022-09-16 — End: 2022-11-16

## 2022-09-27 ENCOUNTER — Non-Acute Institutional Stay (SKILLED_NURSING_FACILITY): Payer: Medicare Other | Admitting: Internal Medicine

## 2022-09-27 ENCOUNTER — Encounter: Payer: Self-pay | Admitting: Internal Medicine

## 2022-09-27 DIAGNOSIS — K644 Residual hemorrhoidal skin tags: Secondary | ICD-10-CM

## 2022-09-27 DIAGNOSIS — K5901 Slow transit constipation: Secondary | ICD-10-CM

## 2022-09-27 DIAGNOSIS — L89151 Pressure ulcer of sacral region, stage 1: Secondary | ICD-10-CM

## 2022-09-27 DIAGNOSIS — I951 Orthostatic hypotension: Secondary | ICD-10-CM

## 2022-09-27 DIAGNOSIS — I480 Paroxysmal atrial fibrillation: Secondary | ICD-10-CM

## 2022-09-27 NOTE — Progress Notes (Signed)
Location: [facility] Room Number: 156-A Place of Service:  SNF (31)  Provider:   Code Status: DNR Goals of Care:     09/27/2022    4:11 PM  Advanced Directives  Does Patient Have a Medical Advance Directive? Yes  Type of Estate agent of [place];Living will;Out of facility DNR (pink MOST or yellow form)  Does patient want to make changes to medical advance directive? No - Patient declined  Copy of Healthcare Power of Attorney in Chart? No - copy requested     Chief Complaint  Patient presents with   Acute Visit   Quality Metric Gaps    Medicare Annual Wellness Visit and Covid vaccine    HPI: Patient is a 89 y.o. female seen today for an acute visit for Hurting in her Bottom when she sits on it Patient is now in SNF  Patient has a history of PAF on Eliquis, recurrent UTI and is on Macrobid, HLD, hypertension, IBS with diarrhea, anxiety. And Depression  C/o Pain in her bottom She thinks it is her hemorrhoids.  Patient nurses have not noticed any bleeding but they think she does strains when using the bathroom. Patient also sits in her chair for long hours   Past Medical History:  Diagnosis Date   Allergic rhinitis    Arthritis    "hands, little in my feet" (06/22/2016)   Candidiasis of vulva and vagina    Cystocele, midline    Disorder of bone and cartilage, unspecified    Fibroids    Hypertension    Large hiatal hernia 06/23/2016   Migraine    "none in the last few years" (06/22/2016)   Osteopenia    Other chronic cystitis    Other sign and symptom in breast    Paroxysmal atrial fibrillation (HCC) 11/30/2012   Pneumonia    "I've had walking pneumonia a couple times"  (06/22/2016)   Pneumonia dx'd 06/15/2016   Rectal incontinence    Stroke (HCC) 2014   hx of mini stroke    TIA (transient ischemic attack) 2015    Past Surgical History:  Procedure Laterality Date   CARDIOVERSION N/A 06/29/2016    Procedure: CARDIOVERSION;  Surgeon: Jake Bathe, MD;  Location: [facility] OR;  Service: Cardiovascular;  Laterality: N/A;   CATARACT EXTRACTION W/ INTRAOCULAR LENS IMPLANT Left    DILATION AND CURETTAGE OF UTERUS     hiatel hernia  08/20/2016   HYSTEROSCOPY WITH D & C  03/16/1999   INSERTION OF MESH N/A 08/20/2016   Procedure: INSERTION OF MESH;  Surgeon: Axel Filler, MD;  Location: [facility] ORS;  Service: General;  Laterality: N/A;   TEE WITHOUT CARDIOVERSION N/A 11/06/2012   Procedure: TRANSESOPHAGEAL ECHOCARDIOGRAM (TEE);  Surgeon: Lewayne Bunting, MD;  Location: [facility] ENDOSCOPY;  Service: Cardiovascular;  Laterality: N/A;   TONSILLECTOMY  1937   VAGINAL HYSTERECTOMY  09/2005   Anterior repair; Removal of urethral caruncle./notes 09/02/2015    Allergies  Allergen Reactions   Lactose Other (See Comments)    Dairy Sensitivity  Dairy Sensitivity     Lisinopril Other (See Comments)    DIZZY   Other     SEASONAL ALLERGIES   Bactrim [Sulfamethoxazole-Trimethoprim] Rash    Outpatient Encounter Medications as of 09/27/2022  Medication Sig   acetaminophen (TYLENOL) 500 MG tablet Take 500 mg by mouth every morning. And one tablet by mouth once a morning.   acetaminophen (TYLENOL) 500 MG tablet Take 500 mg by mouth  every 6 (six) hours as needed.   ALPRAZolam (XANAX) 0.25 MG tablet Take 1 tablet (0.25 mg total) by mouth 2 (two) times daily as needed for anxiety.   amiodarone (PACERONE) 100 MG tablet Take 100 mg by mouth every morning.   CALCIUM CARBONATE-VIT D-MIN PO Take 2 tablets by mouth 3 (three) times a week.    CRANBERRY PO Take 450 mg by mouth daily.   ELIQUIS 5 MG TABS tablet TAKE 1 TABLET BY MOUTH TWICE DAILY   escitalopram (LEXAPRO) 5 MG tablet Take 1 tablet (5 mg total) by mouth daily. 5 mg qd x 2 weeks then increase to 10 mg   estradiol (ESTRACE) 0.1 MG/GM vaginal cream Place 1 Applicatorful vaginally 3 (three) times a week.   Eyelid Cleansers (OCUSOFT EYELID CLEANSING EX) Apply 1  application  topically as needed (dry eyes).   fluticasone (FLONASE) 50 MCG/ACT nasal spray Place 2 sprays into both nostrils daily.   glucosamine-chondroitin 500-400 MG tablet Take 1 tablet by mouth in the morning and at bedtime.   loperamide (IMODIUM A-D) 2 MG tablet Take 2 mg by mouth as needed for diarrhea or loose stools.   loratadine (CLARITIN) 10 MG tablet Take 10 mg by mouth daily as needed for allergies.    midodrine (PROAMATINE) 2.5 MG tablet Take 1 tablet (2.5 mg total) by mouth 2 (two) times daily with a meal. Hold if SBP sitting is >150   nitrofurantoin, macrocrystal-monohydrate, (MACROBID) 100 MG capsule Take 100 mg by mouth at bedtime.   ondansetron (ZOFRAN) 4 MG tablet Take 1 tablet (4 mg total) by mouth every 8 (eight) hours as needed for nausea or vomiting.   Polyethyl Glycol-Propyl Glycol (SYSTANE OP) Place 1 drop into both eyes 2 (two) times daily as needed (for dry eyes).    pravastatin (PRAVACHOL) 40 MG tablet TAKE 1 TABLET(40 MG) BY MOUTH DAILY   No facility-administered encounter medications on file as of 09/27/2022.    Review of Systems:  Review of Systems  Constitutional:  Negative for activity change and appetite change.  HENT: Negative.    Respiratory:  Negative for cough and shortness of breath.   Cardiovascular:  Negative for leg swelling.  Gastrointestinal:  Positive for constipation.  Genitourinary: Negative.   Musculoskeletal:  Positive for gait problem. Negative for arthralgias and myalgias.  Skin:  Positive for color change.  Neurological:  Positive for weakness. Negative for dizziness.  Psychiatric/Behavioral:  Positive for confusion. Negative for dysphoric mood and sleep disturbance.     Health Maintenance  Topic Date Due   Medicare Annual Wellness (AWV)  12/14/2021   COVID-19 Vaccine (5 - 2023-24 season) 12/18/2021   INFLUENZA VACCINE  11/18/2022   DTaP/Tdap/Td (5 - Td or Tdap) 02/19/2026   Pneumonia Vaccine 68+ Years old  Completed   DEXA SCAN   Completed   Zoster Vaccines- Shingrix  Completed   HPV VACCINES  Aged Out   MAMMOGRAM  Discontinued    Physical Exam: Vitals:   09/27/22 1608  BP: (!) 164/78  Pulse: 76  Resp: 17  Temp: (!) 97.3 F (36.3 C)  SpO2: 93%  Weight: 137 lb (62.1 kg)  Height: 5\' 6"  (1.676 m)   Body mass index is 22.11 kg/m. Physical Exam Vitals reviewed.  Constitutional:      Appearance: Normal appearance.  HENT:     Head: Normocephalic.     Nose: Nose normal.     Mouth/Throat:     Mouth: Mucous membranes are moist.  Pharynx: Oropharynx is clear.  Eyes:     Pupils: Pupils are equal, round, and reactive to light.  Cardiovascular:     Rate and Rhythm: Normal rate and regular rhythm.     Pulses: Normal pulses.     Heart sounds: Normal heart sounds. No murmur heard. Pulmonary:     Effort: Pulmonary effort is normal.     Breath sounds: Normal breath sounds.  Abdominal:     General: Abdomen is flat. Bowel sounds are normal.     Palpations: Abdomen is soft.  Genitourinary:    Comments: External Hemorrhoids present but don't seemed Inflamed Rectal exam with no stool in Vault Musculoskeletal:        General: No swelling.     Cervical back: Neck supple.  Skin:    General: Skin is warm.     Comments: Some redness in Perirectal area  Neurological:     General: No focal deficit present.     Mental Status: She is alert and oriented to person, place, and time.  Psychiatric:        Mood and Affect: Mood normal.        Thought Content: Thought content normal.    Labs reviewed: Basic Metabolic Panel: Recent Labs    01/19/22 1846 01/19/22 1946 01/24/22 1146 01/25/22 0020 01/26/22 0105 02/01/22 0000 05/20/22 1356 05/20/22 1430 05/31/22 0000 08/20/22 0000 09/02/22 0000  NA  --    < > 141 141 139   < > 136  --  142 142 137  K  --    < > 3.8 3.9 3.8   < > 3.9  --  4.3 4.3 4.5  CL  --    < > 107 107 106   < > 102  --  105 107 100  CO2  --    < > 26 23 24    < > 26  --  24* 23* 27*   GLUCOSE  --    < > 102* 97 107*  --  116*  --   --   --   --   BUN  --    < > 20 21 13    < > 17  --  14 19 16   CREATININE  --    < > 0.74 0.81 0.65   < > 0.99  --  0.8 0.8 0.8  CALCIUM  --    < > 9.1 8.9 8.7*   < > 8.9  --  9.6 8.6* 9.1  MG  --    < > 2.0 1.9 2.0  --   --   --   --   --   --   TSH 2.428  --   --   --   --   --   --  3.663  --   --   --    < > = values in this interval not displayed.   Liver Function Tests: Recent Labs    01/19/22 1946 05/20/22 1356  AST 30 24  ALT 18 15  ALKPHOS 61 43  BILITOT 0.6 0.5  PROT 8.5* 6.7  ALBUMIN 4.6 3.6   No results for input(s): "LIPASE", "AMYLASE" in the last 8760 hours. No results for input(s): "AMMONIA" in the last 8760 hours. CBC: Recent Labs    01/19/22 1946 01/21/22 0018 01/22/22 0545 01/23/22 0038  WBC 8.9 13.1* 8.6 8.7  NEUTROABS 6.4  --   --   --   HGB 15.9* 13.7 12.7 12.3  HCT 47.7* 41.1 38.5 37.9  MCV 92.4 90.5 91.4 92.0  PLT 263 264 229 269   Lipid Panel: Recent Labs    05/31/22 0000  CHOL 147  HDL 62  LDLCALC 62  TRIG 115   Lab Results  Component Value Date   HGBA1C 5.9 (A) 12/04/2019    Procedures since last visit: No results found.  Assessment/Plan 1. External hemorrhoid Do not seemed inflamed Anusol HC ointment At night   2. Pressure injury of sacral region, stage 1 Continue Zinc  Donut pillow  3. Slow transit constipation Per Nursing care Start on Colasce 100 mg QHS     Labs/tests ordered:  * No order type specified * Next appt:  Visit date not found

## 2022-10-14 ENCOUNTER — Non-Acute Institutional Stay (SKILLED_NURSING_FACILITY): Payer: Medicare Other | Admitting: Adult Health

## 2022-10-14 ENCOUNTER — Encounter: Payer: Self-pay | Admitting: Adult Health

## 2022-10-14 DIAGNOSIS — E785 Hyperlipidemia, unspecified: Secondary | ICD-10-CM

## 2022-10-14 DIAGNOSIS — I951 Orthostatic hypotension: Secondary | ICD-10-CM

## 2022-10-14 DIAGNOSIS — K5901 Slow transit constipation: Secondary | ICD-10-CM | POA: Diagnosis not present

## 2022-10-14 DIAGNOSIS — F329 Major depressive disorder, single episode, unspecified: Secondary | ICD-10-CM | POA: Diagnosis not present

## 2022-10-14 DIAGNOSIS — I480 Paroxysmal atrial fibrillation: Secondary | ICD-10-CM | POA: Diagnosis not present

## 2022-10-14 DIAGNOSIS — K644 Residual hemorrhoidal skin tags: Secondary | ICD-10-CM

## 2022-10-14 DIAGNOSIS — I10 Essential (primary) hypertension: Secondary | ICD-10-CM

## 2022-10-14 MED ORDER — POLYETHYLENE GLYCOL 3350 17 G PO PACK
17.0000 g | PACK | ORAL | 0 refills | Status: DC
Start: 2022-10-14 — End: 2022-11-25

## 2022-10-14 NOTE — Progress Notes (Signed)
Location:  [facility] Room Number: 156-A Place of Service:  SNF [phone]) Provider:  Fletcher Anon, NP    Patient Care Team: Mahlon Gammon, MD as PCP - General (Internal Medicine) [place], [name], MD as PCP - Cardiology (Cardiology) [place], [name], MD as Consulting Physician (Cardiology) Vanessa Barbara, NP as Nurse Practitioner Noel Christmas, MD as Consulting Physician (Urology) Danice Goltz, [place] as Physician Assistant (Cardiology) Ronnald Nian, MD as Consulting Physician (Family Medicine) Noel Christmas, MD as Consulting Physician (Urology)  Extended Emergency Contact Information Primary Emergency Contact: [place],Dr. Alphia Moh, [place] of [place] Home Phone: [phone] Mobile Phone: [phone] Relation: Son Secondary Emergency Contact: [facility] Address: [address]          [place], [place] [address] D[place] of [place] Home Phone: [phone] Relation: Daughter  Code Status:  DNR Goals of care: Advanced Directive information    10/14/2022    9:41 AM  Advanced Directives  Does Patient Have a Medical Advance Directive? Yes  Type of Estate agent of [place];Living will;Out of facility DNR (pink MOST or yellow form)  Does patient want to make changes to medical advance directive? No - Patient declined  Copy of Healthcare Power of Attorney in Chart? No - copy requested     Chief Complaint  Patient presents with   Medical Management of Chronic Issues    Routine Visit    Quality Metric Gaps    Needs to discuss Medicare Annual Wellness Visit and Covid vaccine.    HPI:  Pt is a 89 y.o. female seen today for medical management of chronic diseases.   PMH significant for MCI, HTN, Afib, HLD, nissen fundoplication, chronic leg edema, recurrent UTI, anxiety  Resident reports hemorrhoid supp is helping but continues to feel discomfort in the rectal area. No  bleeding. She reports hard stools but has them regularly. Currently on colace.   Per nurse her daughter is requesting a psych consult due to issues with depression despite taking medication. Ms. Poteet moved to skilled care and is feeling down. She had progressive weakness and memory loss. The nurse reports she is still acclimating. PHQ2 score 1  MMSE 27/17 August 2022 Needs help with bathing and dressing. Able to walk with a walker.   Takes midodrine for orthostatic hypotension. Its is held if SBP >150. Nurse reports she takes it 3-4 times a week. SHe denies feeling dizzy.   Has hx of recurrent UTI, no dysuria or frequency at this time.   Past Medical History:  Diagnosis Date   Allergic rhinitis    Arthritis    "hands, little in my feet" (06/22/2016)   Candidiasis of vulva and vagina    Cystocele, midline    Disorder of bone and cartilage, unspecified    Fibroids    Hypertension    Large hiatal hernia 06/23/2016   Migraine    "none in the last few years" (06/22/2016)   Osteopenia    Other chronic cystitis    Other sign and symptom in breast    Paroxysmal atrial fibrillation (HCC) 11/30/2012   Pneumonia    "I've had walking pneumonia a couple times"  (06/22/2016)   Pneumonia dx'd 06/15/2016   Rectal incontinence    Stroke (HCC) 2014   hx of mini stroke    TIA (transient ischemic attack) 2015   Past Surgical History:  Procedure Laterality Date  CARDIOVERSION N/A 06/29/2016   Procedure: CARDIOVERSION;  Surgeon: Jake Bathe, MD;  Location: [facility] OR;  Service: Cardiovascular;  Laterality: N/A;   CATARACT EXTRACTION W/ INTRAOCULAR LENS IMPLANT Left    DILATION AND CURETTAGE OF UTERUS     hiatel hernia  08/20/2016   HYSTEROSCOPY WITH D & C  03/16/1999   INSERTION OF MESH N/A 08/20/2016   Procedure: INSERTION OF MESH;  Surgeon: Axel Filler, MD;  Location: [facility] ORS;  Service: General;  Laterality: N/A;   TEE WITHOUT CARDIOVERSION N/A 11/06/2012   Procedure: TRANSESOPHAGEAL  ECHOCARDIOGRAM (TEE);  Surgeon: Lewayne Bunting, MD;  Location: [facility] ENDOSCOPY;  Service: Cardiovascular;  Laterality: N/A;   TONSILLECTOMY  1937   VAGINAL HYSTERECTOMY  09/2005   Anterior repair; Removal of urethral caruncle./notes 09/02/2015    Allergies  Allergen Reactions   Lactose Other (See Comments)    Dairy Sensitivity  Dairy Sensitivity     Lisinopril Other (See Comments)    DIZZY   Other     SEASONAL ALLERGIES   Bactrim [Sulfamethoxazole-Trimethoprim] Rash    Outpatient Encounter Medications as of 10/14/2022  Medication Sig   acetaminophen (TYLENOL) 500 MG tablet Take 500 mg by mouth every morning. And one tablet by mouth once a morning.   acetaminophen (TYLENOL) 500 MG tablet Take 500 mg by mouth every 6 (six) hours as needed.   ALPRAZolam (XANAX) 0.25 MG tablet Take 1 tablet (0.25 mg total) by mouth 2 (two) times daily as needed for anxiety.   amiodarone (PACERONE) 100 MG tablet Take 100 mg by mouth every morning.   CALCIUM CARBONATE-VIT D-MIN PO Take 2 tablets by mouth 3 (three) times a week.    CRANBERRY PO Take 450 mg by mouth daily.   diclofenac Sodium (VOLTAREN ARTHRITIS PAIN) 1 % GEL Apply 2 g topically 4 (four) times daily as needed.   docusate sodium (COLACE) 100 MG capsule Take 100 mg by mouth daily.   ELIQUIS 5 MG TABS tablet TAKE 1 TABLET BY MOUTH TWICE DAILY   escitalopram (LEXAPRO) 10 MG tablet Take 10 mg by mouth daily.   estradiol (ESTRACE) 0.1 MG/GM vaginal cream Place 1 Applicatorful vaginally 3 (three) times a week.   Eyelid Cleansers (OCUSOFT EYELID CLEANSING EX) Apply 1 application  topically as needed (dry eyes).   fluticasone (FLONASE) 50 MCG/ACT nasal spray Place 2 sprays into both nostrils daily.   glucosamine-chondroitin 500-400 MG tablet Take 1 tablet by mouth in the morning and at bedtime.   hydrocortisone (ANUSOL-HC) 2.5 % rectal cream Place 1 Application rectally 2 (two) times daily.   lactase (LACTAID) 3000 units tablet Take 9,000 Units by  mouth in the morning.   loperamide (IMODIUM A-D) 2 MG tablet Take 2 mg by mouth as needed for diarrhea or loose stools.   loratadine (CLARITIN) 10 MG tablet Take 10 mg by mouth daily as needed for allergies.    meclizine (ANTIVERT) 25 MG tablet Take 25 mg by mouth as needed for dizziness.   midodrine (PROAMATINE) 5 MG tablet Take 5 mg by mouth 2 (two) times daily with a meal. Hold if SBP sitting is >150   nitrofurantoin, macrocrystal-monohydrate, (MACROBID) 100 MG capsule Take 100 mg by mouth at bedtime.   ondansetron (ZOFRAN) 4 MG tablet Take 1 tablet (4 mg total) by mouth every 8 (eight) hours as needed for nausea or vomiting.   Polyethyl Glycol-Propyl Glycol (SYSTANE OP) Place 1 drop into both eyes 2 (two) times daily as needed (for dry eyes).  polyethylene glycol (MIRALAX) 17 g packet Take 17 g by mouth every other day.   pravastatin (PRAVACHOL) 40 MG tablet TAKE 1 TABLET(40 MG) BY MOUTH DAILY   Probiotic Product (PROBIOTIC DIGESTIVE SUPP PO) Take 1 capsule by mouth once.   [DISCONTINUED] midodrine (PROAMATINE) 2.5 MG tablet Take 1 tablet (2.5 mg total) by mouth 2 (two) times daily with a meal. Hold if SBP sitting is >150   No facility-administered encounter medications on file as of 10/14/2022.    Review of Systems  Constitutional:  Negative for activity change, appetite change, chills, diaphoresis, fatigue, fever and unexpected weight change.  HENT:  Negative for congestion.   Respiratory:  Negative for cough, shortness of breath and wheezing.   Cardiovascular:  Positive for leg swelling. Negative for chest pain and palpitations.  Gastrointestinal:  Positive for constipation and rectal pain. Negative for abdominal distention, abdominal pain and diarrhea.  Genitourinary:  Negative for difficulty urinating and dysuria.  Musculoskeletal:  Positive for gait problem. Negative for arthralgias, back pain, joint swelling and myalgias.  Neurological:  Negative for dizziness, tremors, seizures,  syncope, facial asymmetry, speech difficulty, weakness, light-headedness, numbness and headaches.  Psychiatric/Behavioral:  Positive for confusion. Negative for agitation and behavioral problems.     Immunization History  Administered Date(s) Administered   Fluad Quad(high Dose 65+) 01/09/2019, 01/19/2022   Influenza Split 12/18/2013   Influenza, High Dose Seasonal PF 01/24/2018   Influenza-Unspecified 01/12/2015, 01/13/2016, 01/20/2017, 01/24/2018, 01/06/2021   Moderna Covid-19 Vaccine Bivalent Booster 55yrs & up 01/30/2021   Moderna Sars-Covid-2 Vaccination 05/01/2019, 05/29/2019, 03/04/2020   Pneumococcal Conjugate-13 10/02/2013   Pneumococcal Polysaccharide-23 12/09/2014   Td 05/16/1995, 12/10/2004   Tdap 04/19/2004, 02/20/2016   Zoster Recombinat (Shingrix) 10/29/2016, 03/21/2017   Zoster, Live 02/11/2006   Pertinent  Health Maintenance Due  Topic Date Due   INFLUENZA VACCINE  11/18/2022   DEXA SCAN  Completed   MAMMOGRAM  Discontinued      01/25/2022    7:50 PM 05/11/2022    1:13 PM 06/28/2022    1:49 PM 08/10/2022    2:40 PM 08/16/2022    9:54 AM  Fall Risk  Falls in the past year?  1 1 1 1   Was there an injury with Fall?  0 0 0 0  Fall Risk Category Calculator  1 2 1 1   (RETIRED) Patient Fall Risk Level High fall risk      Patient at Risk for Falls Due to  Impaired mobility History of fall(s) History of fall(s) History of fall(s)  Fall risk Follow up  Falls evaluation completed Falls evaluation completed Falls evaluation completed Falls evaluation completed   Functional Status Survey:    Vitals:   10/14/22 0940  BP: 117/64  Pulse: 73  Resp: 16  Temp: (!) 97.2 F (36.2 C)  SpO2: 93%  Weight: 137 lb (62.1 kg)  Height: 5\' 6"  (1.676 m)   Body mass index is 22.11 kg/m. Physical Exam Vitals and nursing note reviewed.  Genitourinary:    Comments: Has external hemorrhoid. Mild erythema no bleeding or swelling.    Labs reviewed: Recent Labs    01/24/22 1146  01/25/22 0020 01/26/22 0105 02/01/22 0000 05/20/22 1356 05/31/22 0000 08/20/22 0000 09/02/22 0000  NA 141 141 139   < > 136 142 142 137  K 3.8 3.9 3.8   < > 3.9 4.3 4.3 4.5  CL 107 107 106   < > 102 105 107 100  CO2 26 23 24    < > 26  24* 23* 27*  GLUCOSE 102* 97 107*  --  116*  --   --   --   BUN 20 21 13    < > 17 14 19 16   CREATININE 0.74 0.81 0.65   < > 0.99 0.8 0.8 0.8  CALCIUM 9.1 8.9 8.7*   < > 8.9 9.6 8.6* 9.1  MG 2.0 1.9 2.0  --   --   --   --   --    < > = values in this interval not displayed.   Recent Labs    01/19/22 1946 05/20/22 1356  AST 30 24  ALT 18 15  ALKPHOS 61 43  BILITOT 0.6 0.5  PROT 8.5* 6.7  ALBUMIN 4.6 3.6   Recent Labs    01/19/22 1946 01/21/22 0018 01/22/22 0545 01/23/22 0038  WBC 8.9 13.1* 8.6 8.7  NEUTROABS 6.4  --   --   --   HGB 15.9* 13.7 12.7 12.3  HCT 47.7* 41.1 38.5 37.9  MCV 92.4 90.5 91.4 92.0  PLT 263 264 229 269   Lab Results  Component Value Date   TSH 3.663 05/20/2022   Lab Results  Component Value Date   HGBA1C 5.9 (A) 12/04/2019   Lab Results  Component Value Date   CHOL 147 05/31/2022   HDL 62 05/31/2022   LDLCALC 62 05/31/2022   TRIG 115 05/31/2022   CHOLHDL 3.1 04/24/2018    Significant Diagnostic Results in last 30 days:  No results found.  Assessment/Plan  1. External hemorrhoid Increase hemorrhoid cream to bid and and may insert internally  2. Slow transit constipation  - polyethylene glycol (MIRALAX) 17 g packet; Take 17 g by mouth every other day.  Dispense: 14 each; Refill: 0  3. Reactive depression Will order psych consult  4. Paroxysmal atrial fibrillation (HCC) Currently on Eliquis 5 mg bid for CVA risk reduction Rate is controlled with amiodarone TSH and LFTs ok  5. Essential hypertension, benign Controlled  6. Hyperlipidemia with target LDL less than 130 Lab Results  Component Value Date   LDLCALC 62 05/31/2022  Continue pravachol 40   7. Orthostatic hypotension Continue  midodrine 5 mg bid hold if SBP>150   Family/ staff Communication: nurse  Labs/tests ordered:  NA

## 2022-11-15 ENCOUNTER — Encounter: Payer: Self-pay | Admitting: Internal Medicine

## 2022-11-15 ENCOUNTER — Non-Acute Institutional Stay (SKILLED_NURSING_FACILITY): Payer: Medicare Other | Admitting: Internal Medicine

## 2022-11-15 DIAGNOSIS — I951 Orthostatic hypotension: Secondary | ICD-10-CM | POA: Diagnosis not present

## 2022-11-15 DIAGNOSIS — I480 Paroxysmal atrial fibrillation: Secondary | ICD-10-CM

## 2022-11-15 DIAGNOSIS — I10 Essential (primary) hypertension: Secondary | ICD-10-CM | POA: Diagnosis not present

## 2022-11-15 DIAGNOSIS — F329 Major depressive disorder, single episode, unspecified: Secondary | ICD-10-CM

## 2022-11-15 DIAGNOSIS — K644 Residual hemorrhoidal skin tags: Secondary | ICD-10-CM

## 2022-11-15 DIAGNOSIS — E785 Hyperlipidemia, unspecified: Secondary | ICD-10-CM

## 2022-11-15 NOTE — Progress Notes (Signed)
Location:  [facility] Room Number: 134A Place of Service:  SNF [phone]) Provider:  Mahlon Gammon, MD   Mahlon Gammon, MD  Patient Care Team: Mahlon Gammon, MD as PCP - General (Internal Medicine) [place], [name], MD as PCP - Cardiology (Cardiology) [place], [name], MD as Consulting Physician (Cardiology) Vanessa Barbara, NP as Nurse Practitioner Noel Christmas, MD as Consulting Physician (Urology) Danice Goltz, [place] as Physician Assistant (Cardiology) Ronnald Nian, MD as Consulting Physician (Family Medicine) Noel Christmas, MD as Consulting Physician (Urology)  Extended Emergency Contact Information Primary Emergency Contact: Lorenz,Dr. Alphia Moh, [place] D[place] of [place] Home Phone: [phone] Mobile Phone: [phone] Relation: Son Secondary Emergency Contact: [facility] Address: [address]          [place], [place] [address] Darden Amber of [place] Home Phone: [phone] Relation: Daughter  Code Status:  DNR Goals of care: Advanced Directive information    10/14/2022    9:41 AM  Advanced Directives  Does Patient Have a Medical Advance Directive? Yes  Type of Estate agent of [place];Living will;Out of facility DNR (pink MOST or yellow form)  Does patient want to make changes to medical advance directive? No - Patient declined  Copy of Healthcare Power of Attorney in Chart? No - copy requested     Chief Complaint  Patient presents with   Medical Management of Chronic Issues    Patient is being seen for a routine visit.   Immunizations    Patient is due for a Covid vaccine    Quality Metric Gaps    Patient is due for a AWV    HPI:  Pt is a 89 y.o. female seen today for medical management of chronic diseases.    Lives in [place] in SNF  Patient has a history of PAF on Eliquis,  recurrent UTI and is on Macrobid, HLD, hypertension,  IBS with diarrhea, anxiety.    Depression Orthostatic Hypotension on Midodrine  Lives in SNF in [place] Staying stable Has support from her family both son and daughter visits  She uses walker  Works with OT and PT No Recent falls No dizziness. Continues to do well on Midodrine Sometimes they have to hold it due to high BP She did not have any complains Anxiety is controlled Wt Readings from Last 3 Encounters:  11/15/22 133 lb 9.6 oz (60.6 kg)  10/14/22 137 lb (62.1 kg)  09/27/22 137 lb (62.1 kg)    Past Medical History:  Diagnosis Date   Allergic rhinitis    Arthritis    "hands, little in my feet" (06/22/2016)   Candidiasis of vulva and vagina    Cystocele, midline    Disorder of bone and cartilage, unspecified    Fibroids    Hypertension    Large hiatal hernia 06/23/2016   Migraine    "none in the last few years" (06/22/2016)   Osteopenia    Other chronic cystitis    Other sign and symptom in breast    Paroxysmal atrial fibrillation (HCC) 11/30/2012   Pneumonia    "I've had walking pneumonia a couple times"  (06/22/2016)   Pneumonia dx'd 06/15/2016   Rectal incontinence    Stroke (HCC) 2014   hx of mini stroke    TIA (transient ischemic attack) 2015   Past Surgical History:  Procedure Laterality Date   CARDIOVERSION N/A 06/29/2016  Procedure: CARDIOVERSION;  Surgeon: Jake Bathe, MD;  Location: [facility] OR;  Service: Cardiovascular;  Laterality: N/A;   CATARACT EXTRACTION W/ INTRAOCULAR LENS IMPLANT Left    DILATION AND CURETTAGE OF UTERUS     hiatel hernia  08/20/2016   HYSTEROSCOPY WITH D & C  03/16/1999   INSERTION OF MESH N/A 08/20/2016   Procedure: INSERTION OF MESH;  Surgeon: Axel Filler, MD;  Location: [facility] ORS;  Service: General;  Laterality: N/A;   TEE WITHOUT CARDIOVERSION N/A 11/06/2012   Procedure: TRANSESOPHAGEAL ECHOCARDIOGRAM (TEE);  Surgeon: Lewayne Bunting, MD;  Location: [facility] ENDOSCOPY;  Service: Cardiovascular;  Laterality: N/A;   TONSILLECTOMY  1937   VAGINAL HYSTERECTOMY   09/2005   Anterior repair; Removal of urethral caruncle./notes 09/02/2015    Allergies  Allergen Reactions   Lactose Other (See Comments)    Dairy Sensitivity  Dairy Sensitivity     Lisinopril Other (See Comments)    DIZZY   Other     SEASONAL ALLERGIES   Bactrim [Sulfamethoxazole-Trimethoprim] Rash    Outpatient Encounter Medications as of 11/15/2022  Medication Sig   acetaminophen (TYLENOL) 500 MG tablet Take 500 mg by mouth every morning. And one tablet by mouth once a morning.   acetaminophen (TYLENOL) 500 MG tablet Take 500 mg by mouth every 6 (six) hours as needed.   ALPRAZolam (XANAX) 0.25 MG tablet Take 1 tablet (0.25 mg total) by mouth 2 (two) times daily as needed for anxiety.   amiodarone (PACERONE) 100 MG tablet Take 100 mg by mouth every morning.   CALCIUM CARBONATE-VIT D-MIN PO Take 2 tablets by mouth 3 (three) times a week.    CRANBERRY PO Take 450 mg by mouth daily.   docusate sodium (COLACE) 100 MG capsule Take 100 mg by mouth daily.   ELIQUIS 5 MG TABS tablet TAKE 1 TABLET BY MOUTH TWICE DAILY   escitalopram (LEXAPRO) 10 MG tablet Take 10 mg by mouth daily.   estradiol (ESTRACE) 0.1 MG/GM vaginal cream Place 1 Applicatorful vaginally 3 (three) times a week.   glucosamine-chondroitin 500-400 MG tablet Take 1 tablet by mouth in the morning and at bedtime.   hydrocortisone (ANUSOL-HC) 2.5 % rectal cream Place 1 Application rectally 2 (two) times daily.   lactase (LACTAID) 3000 units tablet Take 9,000 Units by mouth in the morning.   loperamide (IMODIUM A-D) 2 MG tablet Take 2 mg by mouth as needed for diarrhea or loose stools.   midodrine (PROAMATINE) 5 MG tablet Take 5 mg by mouth 2 (two) times daily with a meal. Hold if SBP sitting is >150   nitrofurantoin, macrocrystal-monohydrate, (MACROBID) 100 MG capsule Take 100 mg by mouth at bedtime.   ondansetron (ZOFRAN) 4 MG tablet Take 1 tablet (4 mg total) by mouth every 8 (eight) hours as needed for nausea or vomiting.    Polyethyl Glycol-Propyl Glycol (SYSTANE OP) Place 1 drop into both eyes 2 (two) times daily as needed (for dry eyes).    polyethylene glycol (MIRALAX) 17 g packet Take 17 g by mouth every other day.   pravastatin (PRAVACHOL) 40 MG tablet TAKE 1 TABLET(40 MG) BY MOUTH DAILY   Probiotic Product (PROBIOTIC DIGESTIVE SUPP PO) Take 1 capsule by mouth once.   diclofenac Sodium (VOLTAREN ARTHRITIS PAIN) 1 % GEL Apply 2 g topically 4 (four) times daily as needed. (Patient not taking: Reported on 11/15/2022)   Eyelid Cleansers (OCUSOFT EYELID CLEANSING EX) Apply 1 application  topically as needed (dry eyes). (Patient not taking: Reported on  11/15/2022)   fluticasone (FLONASE) 50 MCG/ACT nasal spray Place 2 sprays into both nostrils daily. (Patient not taking: Reported on 11/15/2022)   loratadine (CLARITIN) 10 MG tablet Take 10 mg by mouth daily as needed for allergies.  (Patient not taking: Reported on 11/15/2022)   meclizine (ANTIVERT) 25 MG tablet Take 25 mg by mouth as needed for dizziness. (Patient not taking: Reported on 11/15/2022)   No facility-administered encounter medications on file as of 11/15/2022.    Review of Systems  Constitutional:  Negative for activity change and appetite change.  HENT: Negative.    Respiratory:  Negative for cough and shortness of breath.   Cardiovascular:  Negative for leg swelling.  Gastrointestinal:  Negative for constipation.  Genitourinary: Negative.   Musculoskeletal:  Positive for gait problem. Negative for arthralgias and myalgias.  Skin: Negative.   Neurological:  Negative for dizziness and weakness.  Psychiatric/Behavioral:  Positive for confusion, dysphoric mood and sleep disturbance. The patient is nervous/anxious.     Immunization History  Administered Date(s) Administered   Fluad Quad(high Dose 65+) 01/09/2019, 01/19/2022   Influenza Split 12/18/2013   Influenza, High Dose Seasonal PF 01/24/2018   Influenza-Unspecified 01/12/2015, 01/13/2016,  01/20/2017, 01/24/2018, 01/06/2021   Moderna Covid-19 Vaccine Bivalent Booster 14yrs & up 01/30/2021   Moderna Sars-Covid-2 Vaccination 05/01/2019, 05/29/2019, 03/04/2020   Pneumococcal Conjugate-13 10/02/2013   Pneumococcal Polysaccharide-23 12/09/2014   Td 05/16/1995, 12/10/2004   Tdap 04/19/2004, 02/20/2016   Zoster Recombinant(Shingrix) 10/29/2016, 03/21/2017   Zoster, Live 02/11/2006   Pertinent  Health Maintenance Due  Topic Date Due   INFLUENZA VACCINE  11/18/2022   DEXA SCAN  Completed   MAMMOGRAM  Discontinued      01/25/2022    7:50 PM 05/11/2022    1:13 PM 06/28/2022    1:49 PM 08/10/2022    2:40 PM 08/16/2022    9:54 AM  Fall Risk  Falls in the past year?  1 1 1 1   Was there an injury with Fall?  0 0 0 0  Fall Risk Category Calculator  1 2 1 1   (RETIRED) Patient Fall Risk Level High fall risk      Patient at Risk for Falls Due to  Impaired mobility History of fall(s) History of fall(s) History of fall(s)  Fall risk Follow up  Falls evaluation completed Falls evaluation completed Falls evaluation completed Falls evaluation completed   Functional Status Survey:    Vitals:   11/15/22 1128  BP: 112/64  Pulse: 64  Resp: 18  Temp: 97.8 F (36.6 C)  TempSrc: Temporal  SpO2: 98%  Weight: 133 lb 9.6 oz (60.6 kg)  Height: 5\' 6"  (1.676 m)   Body mass index is 21.56 kg/m. Physical Exam Vitals reviewed.  Constitutional:      Appearance: Normal appearance.  HENT:     Head: Normocephalic.     Nose: Nose normal.     Mouth/Throat:     Mouth: Mucous membranes are moist.     Pharynx: Oropharynx is clear.  Eyes:     Pupils: Pupils are equal, round, and reactive to light.  Cardiovascular:     Rate and Rhythm: Normal rate. Rhythm irregular.     Pulses: Normal pulses.     Heart sounds: Normal heart sounds. No murmur heard. Pulmonary:     Effort: Pulmonary effort is normal.     Breath sounds: Normal breath sounds.  Abdominal:     General: Abdomen is flat. Bowel  sounds are normal.     Palpations:  Abdomen is soft.  Musculoskeletal:        General: No swelling.     Cervical back: Neck supple.  Skin:    General: Skin is warm.  Neurological:     General: No focal deficit present.     Mental Status: She is alert.  Psychiatric:        Mood and Affect: Mood normal.        Thought Content: Thought content normal.     Labs reviewed: Recent Labs    01/24/22 1146 01/25/22 0020 01/26/22 0105 02/01/22 0000 05/20/22 1356 05/31/22 0000 08/20/22 0000 09/02/22 0000  NA 141 141 139   < > 136 142 142 137  K 3.8 3.9 3.8   < > 3.9 4.3 4.3 4.5  CL 107 107 106   < > 102 105 107 100  CO2 26 23 24    < > 26 24* 23* 27*  GLUCOSE 102* 97 107*  --  116*  --   --   --   BUN 20 21 13    < > 17 14 19 16   CREATININE 0.74 0.81 0.65   < > 0.99 0.8 0.8 0.8  CALCIUM 9.1 8.9 8.7*   < > 8.9 9.6 8.6* 9.1  MG 2.0 1.9 2.0  --   --   --   --   --    < > = values in this interval not displayed.   Recent Labs    01/19/22 1946 05/20/22 1356  AST 30 24  ALT 18 15  ALKPHOS 61 43  BILITOT 0.6 0.5  PROT 8.5* 6.7  ALBUMIN 4.6 3.6   Recent Labs    01/19/22 1946 01/21/22 0018 01/22/22 0545 01/23/22 0038  WBC 8.9 13.1* 8.6 8.7  NEUTROABS 6.4  --   --   --   HGB 15.9* 13.7 12.7 12.3  HCT 47.7* 41.1 38.5 37.9  MCV 92.4 90.5 91.4 92.0  PLT 263 264 229 269   Lab Results  Component Value Date   TSH 3.663 05/20/2022   Lab Results  Component Value Date   HGBA1C 5.9 (A) 12/04/2019   Lab Results  Component Value Date   CHOL 147 05/31/2022   HDL 62 05/31/2022   LDLCALC 62 05/31/2022   TRIG 115 05/31/2022   CHOLHDL 3.1 04/24/2018    Significant Diagnostic Results in last 30 days:  No results found.  Assessment/Plan 1. Paroxysmal atrial fibrillation (HCC) Eliquis and Amiodarone  2. Essential hypertension, benign Fluctuates Off all her meds  3. Hyperlipidemia with target LDL less than 130 Statin LDL 62 in 02/24  4. Orthostatic  hypotension Midodrine is working good Sometimes have to hold it due to high BP  5. Reactive depression On Lexapro and Xanax Has not seen Psych yet  6. External hemorrhoid Cream helped and mostly resolved per nurses 7 Recurrent UTI Estrace and Microdantin   Family/ staff Communication:   Labs/tests ordered:

## 2022-11-16 ENCOUNTER — Other Ambulatory Visit: Payer: Self-pay | Admitting: Orthopedic Surgery

## 2022-11-16 DIAGNOSIS — F419 Anxiety disorder, unspecified: Secondary | ICD-10-CM

## 2022-11-16 MED ORDER — ALPRAZOLAM 0.25 MG PO TABS: 0.2500 mg | ORAL_TABLET | Freq: Every day | ORAL | 5 refills | Status: DC

## 2022-11-24 ENCOUNTER — Telehealth: Payer: Self-pay | Admitting: Cardiology

## 2022-11-24 NOTE — Telephone Encounter (Signed)
I will forward this to the provider and nurse for recommendation.

## 2022-11-24 NOTE — Telephone Encounter (Signed)
Daughter is calling to see if appt on Monday is necessary. She states the patient is very frail at the moment. Calling to see what's best. Please advise

## 2022-11-25 ENCOUNTER — Encounter: Payer: Self-pay | Admitting: Adult Health

## 2022-11-25 ENCOUNTER — Ambulatory Visit (HOSPITAL_COMMUNITY): Payer: Medicare Other | Admitting: Physician Assistant

## 2022-11-25 ENCOUNTER — Non-Acute Institutional Stay (SKILLED_NURSING_FACILITY): Payer: Medicare Other | Admitting: Adult Health

## 2022-11-25 DIAGNOSIS — F329 Major depressive disorder, single episode, unspecified: Secondary | ICD-10-CM | POA: Diagnosis not present

## 2022-11-25 DIAGNOSIS — K5901 Slow transit constipation: Secondary | ICD-10-CM | POA: Diagnosis not present

## 2022-11-25 DIAGNOSIS — K0889 Other specified disorders of teeth and supporting structures: Secondary | ICD-10-CM | POA: Diagnosis not present

## 2022-11-25 MED ORDER — AMOXICILLIN-POT CLAVULANATE 875-125 MG PO TABS: 1.0000 | ORAL_TABLET | Freq: Two times a day (BID) | ORAL | 0 refills | Status: DC

## 2022-11-25 MED ORDER — POLYETHYLENE GLYCOL 3350 17 G PO PACK
17.0000 g | PACK | ORAL | Status: DC
Start: 2022-11-25 — End: 2022-12-29

## 2022-11-25 NOTE — Progress Notes (Signed)
Location:  Medical illustrator of Service:  SNF (31) Provider:   Peggye Ley, ANP [facility] [phone]   Mahlon Gammon, MD  Patient Care Team: Mahlon Gammon, MD as PCP - General (Internal Medicine) [place], [name], MD as PCP - Cardiology (Cardiology) [place], [name], MD as Consulting Physician (Cardiology) Vanessa Barbara, NP as Nurse Practitioner Noel Christmas, MD as Consulting Physician (Urology) Danice Goltz, [place] as Physician Assistant (Cardiology) Ronnald Nian, MD as Consulting Physician (Family Medicine) Noel Christmas, MD as Consulting Physician (Urology)  Extended Emergency Contact Information Primary Emergency Contact: Perella,Dr. Alphia Moh, [place] D[place] of [place] Home Phone: [phone] Mobile Phone: [phone] Relation: Son Secondary Emergency Contact: [facility] Address: [address]          [place], [place] [address] Darden Amber of [place] Home Phone: [phone] Relation: Daughter  Code Status:  DNR Goals of care: Advanced Directive information    11/15/2022   11:33 AM  Advanced Directives  Does Patient Have a Medical Advance Directive? Yes  Type of Estate agent of [place];Living will;Out of facility DNR (pink MOST or yellow form)  Does patient want to make changes to medical advance directive? No - Patient declined  Copy of Healthcare Power of Attorney in Chart? No - copy requested     Chief Complaint  Patient presents with   Acute Visit    Tooth pain and depression    HPI:  Pt is a 89 y.o. female seen today for an acute visit for tooth pain and depression/anxiety  Ms. Dondlinger has dementia and resides in skilled care. She reported tooth pain on the upper left side for two days. There has been some mild swelling. She is having pain with eating and brushing her teeth. Temp this am 99.7.   Rapid covid and flud swab 11/25/22 negative  No ear  issues, cough, nasal congestion, or sob noted by the nurses. The patients does report some nasal congestion but she is a poor historian.   In addition Ms. Phillipp has anxiety and moved to skilled care this year. She is on xanax and lexapro. Her family has expressed concerns that she may be depressed as she is not going to the activities at [facility] and lacks motivation. For my visit today she denies anxiety and depression. No weight loss or other acute concerns are noted.    Past Medical History:  Diagnosis Date   Allergic rhinitis    Arthritis    "hands, little in my feet" (06/22/2016)   Candidiasis of vulva and vagina    Cystocele, midline    Disorder of bone and cartilage, unspecified    Fibroids    Hypertension    Large hiatal hernia 06/23/2016   Migraine    "none in the last few years" (06/22/2016)   Osteopenia    Other chronic cystitis    Other sign and symptom in breast    Paroxysmal atrial fibrillation (HCC) 11/30/2012   Pneumonia    "I've had walking pneumonia a couple times"  (06/22/2016)   Pneumonia dx'd 06/15/2016   Rectal incontinence    Stroke (HCC) 2014   hx of mini stroke    TIA (transient ischemic attack) 2015   Past Surgical History:  Procedure Laterality Date   CARDIOVERSION N/A 06/29/2016   Procedure: CARDIOVERSION;  Surgeon: Jake Bathe, MD;  Location: [facility] OR;  Service:  Cardiovascular;  Laterality: N/A;   CATARACT EXTRACTION W/ INTRAOCULAR LENS IMPLANT Left    DILATION AND CURETTAGE OF UTERUS     hiatel hernia  08/20/2016   HYSTEROSCOPY WITH D & C  03/16/1999   INSERTION OF MESH N/A 08/20/2016   Procedure: INSERTION OF MESH;  Surgeon: Axel Filler, MD;  Location: [facility] ORS;  Service: General;  Laterality: N/A;   TEE WITHOUT CARDIOVERSION N/A 11/06/2012   Procedure: TRANSESOPHAGEAL ECHOCARDIOGRAM (TEE);  Surgeon: Lewayne Bunting, MD;  Location: [facility] ENDOSCOPY;  Service: Cardiovascular;  Laterality: N/A;   TONSILLECTOMY  1937   VAGINAL HYSTERECTOMY  09/2005    Anterior repair; Removal of urethral caruncle./notes 09/02/2015    Allergies  Allergen Reactions   Lactose Other (See Comments)    Dairy Sensitivity  Dairy Sensitivity     Lisinopril Other (See Comments)    DIZZY   Other     SEASONAL ALLERGIES   Bactrim [Sulfamethoxazole-Trimethoprim] Rash    Outpatient Encounter Medications as of 11/25/2022  Medication Sig   ALPRAZolam (XANAX) 0.25 MG tablet Take 1 tablet (0.25 mg total) by mouth at bedtime.   amiodarone (PACERONE) 100 MG tablet Take 100 mg by mouth every morning.   CALCIUM CARBONATE-VIT D-MIN PO Take 2 tablets by mouth 3 (three) times a week.    CRANBERRY PO Take 450 mg by mouth daily.   docusate sodium (COLACE) 100 MG capsule Take 100 mg by mouth daily.   ELIQUIS 5 MG TABS tablet TAKE 1 TABLET BY MOUTH TWICE DAILY   escitalopram (LEXAPRO) 10 MG tablet Take 10 mg by mouth daily.   estradiol (ESTRACE) 0.1 MG/GM vaginal cream Place 1 Applicatorful vaginally 3 (three) times a week.   glucosamine-chondroitin 500-400 MG tablet Take 1 tablet by mouth in the morning and at bedtime.   lactase (LACTAID) 3000 units tablet Take 9,000 Units by mouth in the morning.   loperamide (IMODIUM A-D) 2 MG tablet Take 2 mg by mouth as needed for diarrhea or loose stools.   midodrine (PROAMATINE) 5 MG tablet Take 5 mg by mouth 2 (two) times daily with a meal. Hold if SBP sitting is >150   nitrofurantoin, macrocrystal-monohydrate, (MACROBID) 100 MG capsule Take 100 mg by mouth at bedtime.   ondansetron (ZOFRAN) 4 MG tablet Take 1 tablet (4 mg total) by mouth every 8 (eight) hours as needed for nausea or vomiting.   Polyethyl Glycol-Propyl Glycol (SYSTANE OP) Place 1 drop into both eyes 2 (two) times daily as needed (for dry eyes).    polyethylene glycol (MIRALAX) 17 g packet Take 17 g by mouth 2 (two) times a week.   pravastatin (PRAVACHOL) 40 MG tablet TAKE 1 TABLET(40 MG) BY MOUTH DAILY   Probiotic Product (PROBIOTIC DIGESTIVE SUPP PO) Take 1 capsule by  mouth once.   [DISCONTINUED] hydrocortisone (ANUSOL-HC) 2.5 % rectal cream Place 1 Application rectally 2 (two) times daily.   [DISCONTINUED] polyethylene glycol (MIRALAX) 17 g packet Take 17 g by mouth every other day.   acetaminophen (TYLENOL) 500 MG tablet Take 500 mg by mouth every morning. And one tablet by mouth once a morning.   acetaminophen (TYLENOL) 500 MG tablet Take 500 mg by mouth every 6 (six) hours as needed.   No facility-administered encounter medications on file as of 11/25/2022.    Review of Systems  Constitutional:  Positive for fever. Negative for activity change, appetite change, chills, diaphoresis, fatigue and unexpected weight change.  HENT:  Positive for congestion, dental problem and facial swelling. Negative  for drooling, ear pain, hearing loss, mouth sores, nosebleeds, postnasal drip, rhinorrhea, sinus pressure, sinus pain, sneezing, sore throat, tinnitus and trouble swallowing.   Respiratory:  Negative for cough, shortness of breath and wheezing.   Cardiovascular:  Positive for leg swelling. Negative for chest pain and palpitations.  Gastrointestinal:  Negative for abdominal distention, abdominal pain, constipation and diarrhea.  Genitourinary:  Negative for difficulty urinating and dysuria.  Musculoskeletal:  Positive for gait problem. Negative for arthralgias, back pain, joint swelling and myalgias.  Neurological:  Negative for dizziness, tremors, seizures, syncope, facial asymmetry, speech difficulty, weakness, light-headedness, numbness and headaches.  Psychiatric/Behavioral:  Negative for agitation, behavioral problems, confusion, dysphoric mood, hallucinations, self-injury, sleep disturbance and suicidal ideas. The patient is not nervous/anxious and is not hyperactive.        Concerns of depression per family, denied by pt    Immunization History  Administered Date(s) Administered   Fluad Quad(high Dose 65+) 01/09/2019, 01/19/2022   Influenza Split  12/18/2013   Influenza, High Dose Seasonal PF 01/24/2018   Influenza-Unspecified 01/12/2015, 01/13/2016, 01/20/2017, 01/24/2018, 01/06/2021   Moderna Covid-19 Vaccine Bivalent Booster 49yrs & up 01/30/2021   Moderna Sars-Covid-2 Vaccination 05/01/2019, 05/29/2019, 03/04/2020   Pneumococcal Conjugate-13 10/02/2013   Pneumococcal Polysaccharide-23 12/09/2014   Td 05/16/1995, 12/10/2004   Tdap 04/19/2004, 02/20/2016   Zoster Recombinant(Shingrix) 10/29/2016, 03/21/2017   Zoster, Live 02/11/2006   Pertinent  Health Maintenance Due  Topic Date Due   INFLUENZA VACCINE  11/18/2022   DEXA SCAN  Completed   MAMMOGRAM  Discontinued      01/25/2022    7:50 PM 05/11/2022    1:13 PM 06/28/2022    1:49 PM 08/10/2022    2:40 PM 08/16/2022    9:54 AM  Fall Risk  Falls in the past year?  1 1 1 1   Was there an injury with Fall?  0 0 0 0  Fall Risk Category Calculator  1 2 1 1   (RETIRED) Patient Fall Risk Level High fall risk      Patient at Risk for Falls Due to  Impaired mobility History of fall(s) History of fall(s) History of fall(s)  Fall risk Follow up  Falls evaluation completed Falls evaluation completed Falls evaluation completed Falls evaluation completed   Functional Status Survey:    Vitals:   11/25/22 1157  BP: 118/66  Pulse: 67  Resp: 12  Temp: 99.7 F (37.6 C)  SpO2: 96%   There is no height or weight on file to calculate BMI. Physical Exam Vitals and nursing note reviewed.  Constitutional:      General: She is not in acute distress.    Appearance: She is not diaphoretic.  HENT:     Head: Normocephalic and atraumatic.     Comments: Very subtle swelling on the left upper cheek area No redness or warmth No tenderness on palpation. NO abscess noted to the gum line.     Right Ear: Tympanic membrane normal.     Left Ear: Tympanic membrane normal.     Nose: Nose normal.     Mouth/Throat:     Mouth: Mucous membranes are moist.     Pharynx: Oropharynx is clear. No  oropharyngeal exudate or posterior oropharyngeal erythema.  Neck:     Vascular: No JVD.  Cardiovascular:     Rate and Rhythm: Normal rate and regular rhythm.     Heart sounds: No murmur heard. Pulmonary:     Effort: Pulmonary effort is normal. No respiratory distress.  Breath sounds: Normal breath sounds. No wheezing.  Skin:    General: Skin is warm and dry.  Neurological:     General: No focal deficit present.     Mental Status: She is alert. Mental status is at baseline.     Labs reviewed: Recent Labs    01/24/22 1146 01/25/22 0020 01/26/22 0105 02/01/22 0000 05/20/22 1356 05/31/22 0000 08/20/22 0000 09/02/22 0000  NA 141 141 139   < > 136 142 142 137  K 3.8 3.9 3.8   < > 3.9 4.3 4.3 4.5  CL 107 107 106   < > 102 105 107 100  CO2 26 23 24    < > 26 24* 23* 27*  GLUCOSE 102* 97 107*  --  116*  --   --   --   BUN 20 21 13    < > 17 14 19 16   CREATININE 0.74 0.81 0.65   < > 0.99 0.8 0.8 0.8  CALCIUM 9.1 8.9 8.7*   < > 8.9 9.6 8.6* 9.1  MG 2.0 1.9 2.0  --   --   --   --   --    < > = values in this interval not displayed.   Recent Labs    01/19/22 1946 05/20/22 1356  AST 30 24  ALT 18 15  ALKPHOS 61 43  BILITOT 0.6 0.5  PROT 8.5* 6.7  ALBUMIN 4.6 3.6   Recent Labs    01/19/22 1946 01/21/22 0018 01/22/22 0545 01/23/22 0038  WBC 8.9 13.1* 8.6 8.7  NEUTROABS 6.4  --   --   --   HGB 15.9* 13.7 12.7 12.3  HCT 47.7* 41.1 38.5 37.9  MCV 92.4 90.5 91.4 92.0  PLT 263 264 229 269   Lab Results  Component Value Date   TSH 3.663 05/20/2022   Lab Results  Component Value Date   HGBA1C 5.9 (A) 12/04/2019   Lab Results  Component Value Date   CHOL 147 05/31/2022   HDL 62 05/31/2022   LDLCALC 62 05/31/2022   TRIG 115 05/31/2022   CHOLHDL 3.1 04/24/2018    Significant Diagnostic Results in last 30 days:  No results found.  Assessment/Plan 1. Tooth pain Augmentin 875 mg bid x 7 days  Dental referral  2. Reactive depression She is on 10 mg of  lexapro which is the geriatric dose. Will f/u on psych consult ordered in June Unclear if there is true depression vs just not wanting to participate in activities.     Family/ staff Communication: nurse  Labs/tests ordered:  NA

## 2022-11-28 ENCOUNTER — Emergency Department (HOSPITAL_COMMUNITY)
Admission: EM | Admit: 2022-11-28 | Discharge: 2022-11-28 | Disposition: A | Discharge status: Skilled Nursing Facility | Payer: Medicare Other | Source: Home / Self Care | Attending: Student | Admitting: Student

## 2022-11-28 DIAGNOSIS — Z7901 Long term (current) use of anticoagulants: Secondary | ICD-10-CM | POA: Diagnosis not present

## 2022-11-28 DIAGNOSIS — F0390 Unspecified dementia without behavioral disturbance: Secondary | ICD-10-CM | POA: Diagnosis not present

## 2022-11-28 DIAGNOSIS — R55 Syncope and collapse: Secondary | ICD-10-CM | POA: Diagnosis not present

## 2022-11-28 DIAGNOSIS — R531 Weakness: Secondary | ICD-10-CM | POA: Diagnosis not present

## 2022-11-28 DIAGNOSIS — R197 Diarrhea, unspecified: Secondary | ICD-10-CM | POA: Diagnosis present

## 2022-11-28 LAB — CBC WITH DIFFERENTIAL/PLATELET
Abs Immature Granulocytes: 0.03 10*3/uL (ref 0.00–0.07)
Basophils Absolute: 0 10*3/uL (ref 0.0–0.1)
Basophils Relative: 0 %
Eosinophils Absolute: 0.1 10*3/uL (ref 0.0–0.5)
Eosinophils Relative: 2 %
HCT: 40.2 % (ref 36.0–46.0)
Hemoglobin: 12.3 g/dL (ref 12.0–15.0)
Immature Granulocytes: 1 %
Lymphocytes Relative: 16 %
Lymphs Abs: 1 10*3/uL (ref 0.7–4.0)
MCH: 28 pg (ref 26.0–34.0)
MCHC: 30.6 g/dL (ref 30.0–36.0)
MCV: 91.6 fL (ref 80.0–100.0)
Monocytes Absolute: 0.6 10*3/uL (ref 0.1–1.0)
Monocytes Relative: 10 %
Neutro Abs: 4.5 10*3/uL (ref 1.7–7.7)
Neutrophils Relative %: 71 %
Platelets: 246 10*3/uL (ref 150–400)
RBC: 4.39 MIL/uL (ref 3.87–5.11)
RDW: 15.8 % — ABNORMAL HIGH (ref 11.5–15.5)
WBC: 6.2 10*3/uL (ref 4.0–10.5)
nRBC: 0 % (ref 0.0–0.2)

## 2022-11-28 LAB — COMPREHENSIVE METABOLIC PANEL
ALT: 14 U/L (ref 0–44)
AST: 20 U/L (ref 15–41)
Albumin: 3.4 g/dL — ABNORMAL LOW (ref 3.5–5.0)
Alkaline Phosphatase: 43 U/L (ref 38–126)
Anion gap: 12 (ref 5–15)
BUN: 21 mg/dL (ref 8–23)
CO2: 23 mmol/L (ref 22–32)
Calcium: 8.8 mg/dL — ABNORMAL LOW (ref 8.9–10.3)
Chloride: 106 mmol/L (ref 98–111)
Creatinine, Ser: 1.15 mg/dL — ABNORMAL HIGH (ref 0.44–1.00)
GFR, Estimated: 45 mL/min — ABNORMAL LOW (ref >60)
Glucose, Bld: 103 mg/dL — ABNORMAL HIGH (ref 70–99)
Potassium: 3.7 mmol/L (ref 3.5–5.1)
Sodium: 141 mmol/L (ref 135–145)
Total Bilirubin: 0.4 mg/dL (ref 0.3–1.2)
Total Protein: 6.6 g/dL (ref 6.5–8.1)

## 2022-11-28 LAB — LIPASE, BLOOD: Lipase: 28 U/L (ref 11–51)

## 2022-11-28 MED ORDER — LOPERAMIDE HCL 2 MG PO CAPS
4.0000 mg | ORAL_CAPSULE | Freq: Once | ORAL | Status: AC
  Administered 2022-11-28: 4 mg via ORAL
  Filled 2022-11-28: qty 2

## 2022-11-28 MED ORDER — LACTATED RINGERS IV BOLUS
1000.0000 mL | Freq: Once | INTRAVENOUS | Status: AC
  Administered 2022-11-28: 1000 mL via INTRAVENOUS

## 2022-11-28 NOTE — ED Provider Notes (Signed)
[place] EMERGENCY DEPARTMENT AT [facility] Provider Note   CSN: [id number] Arrival date & time: 11/28/22  0435     History {Add pertinent medical, surgical, social history, OB history to HPI:1} No chief complaint on file.   [name] is a 89 y.o. female.  History of dementia. Brought in by EMS for ***. Patient states she had one large episode of diarrhea around 0300 and subsequently was too weak to get off the toilet. States that she called the help at her memory care place and was sent here. States she takes colace, immodium and miralax PRN for her IBS. Recently started augmentin for tooth infection. No  nausea. No known fevers. No known sick contacts. No known suspicious food intake.   Review of the notes show that she had left sided cheek swelling and redness associated with a tooth infection, she states that she thinks this feels better.         Home Medications Prior to Admission medications   Medication Sig Start Date End Date Taking? Authorizing Provider  acetaminophen (TYLENOL) 500 MG tablet Take 500 mg by mouth every morning. And one tablet by mouth once a morning.    [provider]  acetaminophen (TYLENOL) 500 MG tablet Take 500 mg by mouth every 6 (six) hours as needed.    [provider]  ALPRAZolam Prudy Feeler) 0.25 MG tablet Take 1 tablet (0.25 mg total) by mouth at bedtime. 11/16/22   Fargo, Amy E, NP  amiodarone (PACERONE) 100 MG tablet Take 100 mg by mouth every morning.    [provider]  amoxicillin-clavulanate (AUGMENTIN) 875-125 MG tablet Take 1 tablet by mouth 2 (two) times daily for 7 days. 11/25/22 12/02/22  Fletcher Anon, NP  CALCIUM CARBONATE-VIT D-MIN PO Take 2 tablets by mouth 3 (three) times a week.     [provider]  CRANBERRY PO Take 450 mg by mouth daily.    [provider]  docusate sodium (COLACE) 100 MG capsule Take 100 mg by mouth daily.    [provider]  ELIQUIS 5 MG TABS  tablet TAKE 1 TABLET BY MOUTH TWICE DAILY 06/14/22   [place], [name], MD  escitalopram (LEXAPRO) 10 MG tablet Take 10 mg by mouth daily.    [provider]  estradiol (ESTRACE) 0.1 MG/GM vaginal cream Place 1 Applicatorful vaginally 3 (three) times a week. 08/22/20   [provider]  glucosamine-chondroitin 500-400 MG tablet Take 1 tablet by mouth in the morning and at bedtime.    [provider]  lactase (LACTAID) 3000 units tablet Take 9,000 Units by mouth in the morning.    [provider]  loperamide (IMODIUM A-D) 2 MG tablet Take 2 mg by mouth as needed for diarrhea or loose stools.    [provider]  midodrine (PROAMATINE) 5 MG tablet Take 5 mg by mouth 2 (two) times daily with a meal. Hold if SBP sitting is >150    [provider]  nitrofurantoin, macrocrystal-monohydrate, (MACROBID) 100 MG capsule Take 100 mg by mouth at bedtime. 05/29/20   [provider]  ondansetron (ZOFRAN) 4 MG tablet Take 1 tablet (4 mg total) by mouth every 8 (eight) hours as needed for nausea or vomiting. 07/07/16   Ronnald Nian, MD  Polyethyl Glycol-Propyl Glycol (SYSTANE OP) Place 1 drop into both eyes 2 (two) times daily as needed (for dry eyes).     [provider]  polyethylene glycol (MIRALAX) 17 g packet Take  17 g by mouth 2 (two) times a week. 11/25/22   Fletcher Anon, NP  pravastatin (PRAVACHOL) 40 MG tablet TAKE 1 TABLET(40 MG) BY MOUTH DAILY 08/30/22   Mahlon Gammon, MD  Probiotic Product (PROBIOTIC DIGESTIVE SUPP PO) Take 1 capsule by mouth once.    [provider]      Allergies    Lactose, Lisinopril, Other, and Bactrim [sulfamethoxazole-trimethoprim]    Review of Systems   Review of Systems  Physical Exam Updated Vital Signs BP (!) 134/49 (BP Location: Left Arm)   Pulse 68   Temp 97.9 F (36.6 C) (Oral)   Resp 16   SpO2 96%  Physical Exam Vitals and nursing note reviewed.  Constitutional:      Appearance:  She is well-developed.     Comments: Seems oriented but has h/o dementia  HENT:     Head: Normocephalic and atraumatic.     Comments: No obvious oral abscess/infection or overlying skin changes Cardiovascular:     Rate and Rhythm: Normal rate and regular rhythm.  Pulmonary:     Effort: No respiratory distress.     Breath sounds: No stridor.  Abdominal:     General: There is no distension.  Musculoskeletal:        General: Normal range of motion.     Cervical back: Normal range of motion.  Neurological:     Mental Status: She is alert.     ED Results / Procedures / Treatments   Labs (all labs ordered are listed, but only abnormal results are displayed) Labs Reviewed  C DIFFICILE QUICK SCREEN W PCR REFLEX    CBC WITH DIFFERENTIAL/PLATELET  COMPREHENSIVE METABOLIC PANEL  LIPASE, BLOOD    EKG None  Radiology No results found.  Procedures Procedures  {Document cardiac monitor, telemetry assessment procedure when appropriate:1}  Medications Ordered in ED Medications  lactated ringers bolus 1,000 mL (has no administration in time range)  loperamide (IMODIUM) capsule 4 mg (has no administration in time range)    ED Course/ Medical Decision Making/ A&P   {   Click here for ABCD2, HEART and other calculatorsREFRESH Note before signing :1}                              Medical Decision Making Amount and/or Complexity of Data Reviewed Labs: ordered.  Risk Prescription drug management.   ***  {Document critical care time when appropriate:1} {Document review of labs and clinical decision tools ie heart score, Chads2Vasc2 etc:1}  {Document your independent review of radiology images, and any outside records:1} {Document your discussion with family members, caretakers, and with consultants:1} {Document social determinants of health affecting pt's care:1} {Document your decision making why or why not admission, treatments were needed:1} Final Clinical Impression(s) /  ED Diagnoses Final diagnoses:  None    Rx / DC Orders ED Discharge Orders     None

## 2022-11-28 NOTE — ED Provider Notes (Signed)
  Physical Exam  BP (!) 168/78   Pulse 76   Temp 98 F (36.7 C) (Oral)   Resp 16   SpO2 99%   Physical Exam Vitals and nursing note reviewed.  Constitutional:      General: She is not in acute distress.    Appearance: She is well-developed.  HENT:     Head: Normocephalic and atraumatic.  Eyes:     Conjunctiva/sclera: Conjunctivae normal.  Cardiovascular:     Rate and Rhythm: Normal rate and regular rhythm.     Heart sounds: No murmur heard. Pulmonary:     Effort: Pulmonary effort is normal. No respiratory distress.  Musculoskeletal:        General: No swelling.     Cervical back: Neck supple.  Skin:    General: Skin is warm and dry.     Capillary Refill: Capillary refill takes less than 2 seconds.  Neurological:     Mental Status: She is alert.  Psychiatric:        Mood and Affect: Mood normal.     Procedures  Procedures  ED Course / MDM    Medical Decision Making Amount and/or Complexity of Data Reviewed Labs: ordered.  Risk Prescription drug management.   Patient received in handoff.  Diarrhea with episode of orthostatic presyncope at the nursing facility.  Fluid resuscitated by previous provider and pending orthostatic vital signs at time of signout.  Patient did have some orthostasis when going from sitting to standing and she just received another liter of fluids.  Her daughter then arrived and states that she has had orthostatic hypotension for multiple years and that this is not a new problem.  Vital signs have remained stable here in the emergency department and as she has not had multiple bowel movements concerning for C. difficile at this time and thus we will defer stool studies.  She was able to ambulate without difficulty here in the emergency department and at this time does not meet inpatient criteria for admission.  Patient discharged with outpatient follow-up       Glendora Score, MD 11/28/22 [phone]

## 2022-11-28 NOTE — ED Notes (Signed)
Transfer of care repot to yellow zone RN.

## 2022-11-28 NOTE — ED Notes (Signed)
Patient will be sent home with daughter, Denny Peon nurse supervisor at [employer] states they will be able to help the patient get into the building.

## 2022-11-28 NOTE — ED Triage Notes (Signed)
PT BIB EMS from facility. Diarrhea x 2 days, with last episode being "explosive". Facility staff reported finding her on the toilet looking pale and generally ill-appearing. Pt has hx of IBS. VSS

## 2022-11-28 NOTE — ED Notes (Signed)
Pt ambulated to restroom with walker denies dizziness. Pt has slow, even and steady gait.

## 2022-11-29 ENCOUNTER — Encounter: Payer: Self-pay | Admitting: Adult Health

## 2022-11-29 ENCOUNTER — Non-Acute Institutional Stay: Payer: Self-pay | Admitting: Adult Health

## 2022-11-29 ENCOUNTER — Ambulatory Visit: Payer: Medicare Other | Admitting: Cardiology

## 2022-11-29 DIAGNOSIS — F411 Generalized anxiety disorder: Secondary | ICD-10-CM

## 2022-11-29 DIAGNOSIS — K0889 Other specified disorders of teeth and supporting structures: Secondary | ICD-10-CM

## 2022-11-29 DIAGNOSIS — R634 Abnormal weight loss: Secondary | ICD-10-CM | POA: Diagnosis not present

## 2022-11-29 DIAGNOSIS — F0390 Unspecified dementia without behavioral disturbance: Secondary | ICD-10-CM

## 2022-11-29 DIAGNOSIS — Z7189 Other specified counseling: Secondary | ICD-10-CM

## 2022-11-29 NOTE — Progress Notes (Signed)
Location:  Medical illustrator of Service:  SNF (31) Provider:   Peggye Ley, ANP [facility] [phone]   Mahlon Gammon, MD  Patient Care Team: Mahlon Gammon, MD as PCP - General (Internal Medicine) [place], [name], MD as PCP - Cardiology (Cardiology) [place], [name], MD as Consulting Physician (Cardiology) Vanessa Barbara, NP as Nurse Practitioner Noel Christmas, MD as Consulting Physician (Urology) Danice Goltz, [place] as Physician Assistant (Cardiology) Ronnald Nian, MD as Consulting Physician (Family Medicine) Noel Christmas, MD as Consulting Physician (Urology)  Extended Emergency Contact Information Primary Emergency Contact: Fishman,Dr. Alphia Moh, [place] D[place] of [place] Home Phone: [phone] Mobile Phone: [phone] Relation: Son Secondary Emergency Contact: [facility] Address: [address]          [place], [place] [address] D[place] of [place] Home Phone: [phone] Relation: Daughter  Code Status:  DNR Goals of care: Advanced Directive information    11/15/2022   11:33 AM  Advanced Directives  Does Patient Have a Medical Advance Directive? Yes  Type of Estate agent of [place];Living will;Out of facility DNR (pink MOST or yellow form)  Does patient want to make changes to medical advance directive? No - Patient declined  Copy of Healthcare Power of Attorney in Chart? No - copy requested     Chief Complaint  Patient presents with   Acute Visit    F/U ER visit    HPI:  Pt is a 89 y.o. female seen today for an acute visit for an ER visit.   She was found on the floor in the bathroom shaking, cold, clammy with diarrhea all over the floor on 8/11.  She was sent to the ER. CBC and CMP were ok. Fluids were given and she was sent back to [facility]  No further diarrhea reported. No fever or abd pain. Has a hx of chronic IBS with diarrhea. ON Lactose  free diet.  She is on augmentin for tooth pain started 8/8. She reports the upper left maxilla area is improving. She has had some nasal drainage as well. No purulent matter. Covid swab neg 8/8.   She has memory loss that is progressing. Also chronic orthostatic hypotension treated with abd binder, compression hose, and midodrine.   She is having increased anxiety, dysphoric mood, weight loss Down 7 lbs in the past couple of months.    Past Medical History:  Diagnosis Date   Allergic rhinitis    Arthritis    "hands, little in my feet" (06/22/2016)   Candidiasis of vulva and vagina    Cystocele, midline    Disorder of bone and cartilage, unspecified    Fibroids    Hypertension    Large hiatal hernia 06/23/2016   Migraine    "none in the last few years" (06/22/2016)   Osteopenia    Other chronic cystitis    Other sign and symptom in breast    Paroxysmal atrial fibrillation (HCC) 11/30/2012   Pneumonia    "I've had walking pneumonia a couple times"  (06/22/2016)   Pneumonia dx'd 06/15/2016   Rectal incontinence    Stroke (HCC) 2014   hx of mini stroke    TIA (transient ischemic attack) 2015   Past Surgical History:  Procedure Laterality Date   CARDIOVERSION N/A 06/29/2016   Procedure: CARDIOVERSION;  Surgeon: Jake Bathe, MD;  Location: [facility] OR;  Service:  Cardiovascular;  Laterality: N/A;   CATARACT EXTRACTION W/ INTRAOCULAR LENS IMPLANT Left    DILATION AND CURETTAGE OF UTERUS     hiatel hernia  08/20/2016   HYSTEROSCOPY WITH D & C  03/16/1999   INSERTION OF MESH N/A 08/20/2016   Procedure: INSERTION OF MESH;  Surgeon: Axel Filler, MD;  Location: [facility] ORS;  Service: General;  Laterality: N/A;   TEE WITHOUT CARDIOVERSION N/A 11/06/2012   Procedure: TRANSESOPHAGEAL ECHOCARDIOGRAM (TEE);  Surgeon: Lewayne Bunting, MD;  Location: [facility] ENDOSCOPY;  Service: Cardiovascular;  Laterality: N/A;   TONSILLECTOMY  1937   VAGINAL HYSTERECTOMY  09/2005   Anterior repair; Removal of urethral  caruncle./notes 09/02/2015    Allergies  Allergen Reactions   Lactose Other (See Comments)    Dairy Sensitivity  Dairy Sensitivity     Lisinopril Other (See Comments)    DIZZY   Other     SEASONAL ALLERGIES   Bactrim [Sulfamethoxazole-Trimethoprim] Rash    Outpatient Encounter Medications as of 11/29/2022  Medication Sig   acetaminophen (TYLENOL) 500 MG tablet Take 500 mg by mouth every morning. And one tablet by mouth once a morning.   acetaminophen (TYLENOL) 500 MG tablet Take 500 mg by mouth every 6 (six) hours as needed.   ALPRAZolam (XANAX) 0.25 MG tablet Take 1 tablet (0.25 mg total) by mouth at bedtime.   amiodarone (PACERONE) 100 MG tablet Take 100 mg by mouth every morning.   amoxicillin-clavulanate (AUGMENTIN) 875-125 MG tablet Take 1 tablet by mouth 2 (two) times daily for 7 days.   CALCIUM CARBONATE-VIT D-MIN PO Take 2 tablets by mouth 3 (three) times a week.    CRANBERRY PO Take 450 mg by mouth daily.   docusate sodium (COLACE) 100 MG capsule Take 100 mg by mouth daily.   ELIQUIS 5 MG TABS tablet TAKE 1 TABLET BY MOUTH TWICE DAILY   escitalopram (LEXAPRO) 10 MG tablet Take 10 mg by mouth daily.   estradiol (ESTRACE) 0.1 MG/GM vaginal cream Place 1 Applicatorful vaginally 3 (three) times a week.   glucosamine-chondroitin 500-400 MG tablet Take 1 tablet by mouth in the morning and at bedtime.   lactase (LACTAID) 3000 units tablet Take 9,000 Units by mouth in the morning.   loperamide (IMODIUM A-D) 2 MG tablet Take 2 mg by mouth as needed for diarrhea or loose stools.   midodrine (PROAMATINE) 5 MG tablet Take 5 mg by mouth 2 (two) times daily with a meal. Hold if SBP sitting is >150   nitrofurantoin, macrocrystal-monohydrate, (MACROBID) 100 MG capsule Take 100 mg by mouth at bedtime.   ondansetron (ZOFRAN) 4 MG tablet Take 1 tablet (4 mg total) by mouth every 8 (eight) hours as needed for nausea or vomiting.   Polyethyl Glycol-Propyl Glycol (SYSTANE OP) Place 1 drop into  both eyes 2 (two) times daily as needed (for dry eyes).    polyethylene glycol (MIRALAX) 17 g packet Take 17 g by mouth 2 (two) times a week.   pravastatin (PRAVACHOL) 40 MG tablet TAKE 1 TABLET(40 MG) BY MOUTH DAILY   Probiotic Product (PROBIOTIC DIGESTIVE SUPP PO) Take 1 capsule by mouth once.   No facility-administered encounter medications on file as of 11/29/2022.    Review of Systems  Constitutional:  Positive for appetite change and unexpected weight change. Negative for activity change, chills, diaphoresis, fatigue and fever.  HENT:  Negative for congestion.   Respiratory:  Negative for cough, shortness of breath and wheezing.   Cardiovascular:  Positive for leg swelling.  Negative for chest pain and palpitations.  Gastrointestinal:  Negative for abdominal distention, abdominal pain, constipation and diarrhea.  Genitourinary:  Negative for difficulty urinating and dysuria.  Musculoskeletal:  Positive for gait problem. Negative for arthralgias, back pain, joint swelling and myalgias.  Neurological:  Negative for dizziness, tremors, seizures, syncope, facial asymmetry, speech difficulty, weakness, light-headedness, numbness and headaches.       Low bp when standing  Psychiatric/Behavioral:  Positive for confusion, dysphoric mood and hallucinations. Negative for agitation and behavioral problems. The patient is nervous/anxious.     Immunization History  Administered Date(s) Administered   Fluad Quad(high Dose 65+) 01/09/2019, 01/19/2022   Influenza Split 12/18/2013   Influenza, High Dose Seasonal PF 01/24/2018   Influenza-Unspecified 01/12/2015, 01/13/2016, 01/20/2017, 01/24/2018, 01/06/2021   Moderna Covid-19 Vaccine Bivalent Booster 90yrs & up 01/30/2021   Moderna Sars-Covid-2 Vaccination 05/01/2019, 05/29/2019, 03/04/2020   Pneumococcal Conjugate-13 10/02/2013   Pneumococcal Polysaccharide-23 12/09/2014   Td 05/16/1995, 12/10/2004   Tdap 04/19/2004, 02/20/2016   Zoster  Recombinant(Shingrix) 10/29/2016, 03/21/2017   Zoster, Live 02/11/2006   Pertinent  Health Maintenance Due  Topic Date Due   INFLUENZA VACCINE  11/18/2022   DEXA SCAN  Completed   MAMMOGRAM  Discontinued      01/25/2022    7:50 PM 05/11/2022    1:13 PM 06/28/2022    1:49 PM 08/10/2022    2:40 PM 08/16/2022    9:54 AM  Fall Risk  Falls in the past year?  1 1 1 1   Was there an injury with Fall?  0 0 0 0  Fall Risk Category Calculator  1 2 1 1   (RETIRED) Patient Fall Risk Level High fall risk      Patient at Risk for Falls Due to  Impaired mobility History of fall(s) History of fall(s) History of fall(s)  Fall risk Follow up  Falls evaluation completed Falls evaluation completed Falls evaluation completed Falls evaluation completed   Functional Status Survey:    Vitals:   11/29/22 1630  BP: (!) 159/61  Pulse: (!) 56  Resp: 16  Temp: (!) 97.2 F (36.2 C)  SpO2: 93%  Weight: 130 lb (59 kg)   Body mass index is 20.98 kg/m. Physical Exam Vitals and nursing note reviewed.  Constitutional:      General: She is not in acute distress.    Appearance: She is not diaphoretic.  HENT:     Head: Normocephalic and atraumatic.     Nose: Nose normal. No congestion.     Mouth/Throat:     Mouth: Mucous membranes are moist.     Pharynx: Oropharynx is clear. No oropharyngeal exudate or posterior oropharyngeal erythema.  Neck:     Vascular: No JVD.  Cardiovascular:     Rate and Rhythm: Normal rate and regular rhythm.     Heart sounds: No murmur heard. Pulmonary:     Effort: Pulmonary effort is normal. No respiratory distress.     Breath sounds: Normal breath sounds. No wheezing.  Abdominal:     General: Bowel sounds are normal. There is no distension.     Palpations: Abdomen is soft.     Tenderness: There is no abdominal tenderness.  Skin:    General: Skin is warm and dry.  Neurological:     General: No focal deficit present.     Mental Status: She is alert. Mental status is at  baseline.     Labs reviewed: Recent Labs    01/24/22 1146 01/25/22 0020 01/26/22 0105 02/01/22  0000 05/20/22 1356 05/31/22 0000 08/20/22 0000 09/02/22 0000 11/28/22 0523  NA 141 141 139   < > 136   < > 142 137 141  K 3.8 3.9 3.8   < > 3.9   < > 4.3 4.5 3.7  CL 107 107 106   < > 102   < > 107 100 106  CO2 26 23 24    < > 26   < > 23* 27* 23  GLUCOSE 102* 97 107*  --  116*  --   --   --  103*  BUN 20 21 13    < > 17   < > 19 16 21   CREATININE 0.74 0.81 0.65   < > 0.99   < > 0.8 0.8 1.15*  CALCIUM 9.1 8.9 8.7*   < > 8.9   < > 8.6* 9.1 8.8*  MG 2.0 1.9 2.0  --   --   --   --   --   --    < > = values in this interval not displayed.   Recent Labs    01/19/22 1946 05/20/22 1356 11/28/22 0523  AST 30 24 20   ALT 18 15 14   ALKPHOS 61 43 43  BILITOT 0.6 0.5 0.4  PROT 8.5* 6.7 6.6  ALBUMIN 4.6 3.6 3.4*   Recent Labs    01/19/22 1946 01/21/22 0018 01/22/22 0545 01/23/22 0038 11/28/22 0523  WBC 8.9   < > 8.6 8.7 6.2  NEUTROABS 6.4  --   --   --  4.5  HGB 15.9*   < > 12.7 12.3 12.3  HCT 47.7*   < > 38.5 37.9 40.2  MCV 92.4   < > 91.4 92.0 91.6  PLT 263   < > 229 269 246   < > = values in this interval not displayed.   Lab Results  Component Value Date   TSH 3.663 05/20/2022   Lab Results  Component Value Date   HGBA1C 5.9 (A) 12/04/2019   Lab Results  Component Value Date   CHOL 147 05/31/2022   HDL 62 05/31/2022   LDLCALC 62 05/31/2022   TRIG 115 05/31/2022   CHOLHDL 3.1 04/24/2018    Significant Diagnostic Results in last 30 days:  No results found.  Assessment/Plan 1. Tooth pain Improved with Augmentin Drink full glass of water with augmentin to help with diarrhea If still occurring let [facility] know to change regimen Pending dental apt  2. Generalized anxiety disorder Continues to have periods of anxiety and paranoia confounded by underlying dementia Pending F/U with Dr Donell Beers this week   3. Weight loss Due to anxiety, reduced intake See #2  4.  Dementia without behavioral disturbance (HCC) Progressing over time with further short term memory loss and delusions.  Consider Seroquel and/or Remeron if needed, will wait on input from psych  5. Advanced care planning/counseling discussion I called and discussed Ms Bentivegna's recent ER visit with her daughter Ms. Janann August. She let me know that she is concerned that Ms. Akkerman's memory is getting worse and now she is having some delusions which are bothersome. She is not participating in activities at [facility]. She is eating less and losing weight. I mentioned that we should consider filling out a most form to avoid further hospitalizations if necessary. She agreed. She will be by to fill out the form. I will contact F[facility] Knee DON and ensure that she is seen by Psych. We will also consult with the nutritionist for  further recommendations regarding supplements and meal choices.     Family/ staff Communication: discussed with Daughter Ms. Janann August  Labs/tests ordered: done in ER

## 2022-11-30 ENCOUNTER — Other Ambulatory Visit: Payer: Self-pay | Admitting: Orthopedic Surgery

## 2022-11-30 DIAGNOSIS — K0889 Other specified disorders of teeth and supporting structures: Secondary | ICD-10-CM

## 2022-11-30 MED ORDER — AMOXICILLIN 500 MG PO CAPS
500.0000 mg | ORAL_CAPSULE | Freq: Three times a day (TID) | ORAL | Status: AC
Start: 2022-11-30 — End: 2022-12-02

## 2022-11-30 NOTE — Progress Notes (Signed)
She continues to have diarrhea while on Augmentin. Will change antibiotic to amoxicillin 500 mg TID x 2 days. Continue Florastor. Scheduled to see dentist 08/15.

## 2022-12-09 ENCOUNTER — Encounter: Payer: Self-pay | Admitting: Adult Health

## 2022-12-09 ENCOUNTER — Non-Acute Institutional Stay (SKILLED_NURSING_FACILITY): Payer: Medicare Other | Admitting: Adult Health

## 2022-12-09 DIAGNOSIS — F329 Major depressive disorder, single episode, unspecified: Secondary | ICD-10-CM | POA: Diagnosis not present

## 2022-12-09 DIAGNOSIS — N390 Urinary tract infection, site not specified: Secondary | ICD-10-CM | POA: Diagnosis not present

## 2022-12-09 MED ORDER — MIRTAZAPINE 30 MG PO TABS: 30.0000 mg | ORAL_TABLET | Freq: Every day | ORAL | Status: DC

## 2022-12-09 NOTE — Progress Notes (Signed)
Location:  Medical illustrator of Service:  SNF (31) Provider:   Peggye Ley, ANP [facility] [phone]   Mahlon Gammon, MD  Patient Care Team: Mahlon Gammon, MD as PCP - General (Internal Medicine) [place], [name], MD as PCP - Cardiology (Cardiology) [place], [name], MD as Consulting Physician (Cardiology) Vanessa Barbara, NP as Nurse Practitioner Noel Christmas, MD as Consulting Physician (Urology) Danice Goltz, [place] as Physician Assistant (Cardiology) Ronnald Nian, MD as Consulting Physician (Family Medicine) Noel Christmas, MD as Consulting Physician (Urology)  Extended Emergency Contact Information Primary Emergency Contact: Orner,Dr. Alphia Moh, [place] of [place] Home Phone: [phone] Mobile Phone: [phone] Relation: Son Secondary Emergency Contact: [facility] Address: [address]          [place], [place] [address] Darden [place] of [place] Home Phone: [phone] Relation: Daughter  Code Status:  DNR Goals of care: Advanced Directive information    11/15/2022   11:33 AM  Advanced Directives  Does Patient Have a Medical Advance Directive? Yes  Type of Estate agent of [place];Living will;Out of facility DNR (pink MOST or yellow form)  Does patient want to make changes to medical advance directive? No - Patient declined  Copy of Healthcare Power of Attorney in Chart? No - copy requested     Chief Complaint  Patient presents with   Acute Visit    HPI:  Pt is a 89 y.o. female seen today for an acute visit for frequency.  Ms. Sebastian has a hx of recurrent UTI and is on macrodantin for prophylaxis.    On 12/08/22 she had increased frequency and urgency.  UA was obtained which had WBC 5-10, epithelial cells noted, neg for nitrite and neg leukocyte esterase. Final culture grw <10,000 colonies of mixed flora.  Symptoms of frequency have improved. She  is not febrile  She is losing, experiencing depression and anxiety. Her memory is worsening and she is having some delusions.  She saw Dr Donell Beers today and he added Remeron and increased her lexapro to 20 mg.    Past Medical History:  Diagnosis Date   Allergic rhinitis    Arthritis    "hands, little in my feet" (06/22/2016)   Candidiasis of vulva and vagina    Cystocele, midline    Disorder of bone and cartilage, unspecified    Fibroids    Hypertension    Large hiatal hernia 06/23/2016   Migraine    "none in the last few years" (06/22/2016)   Osteopenia    Other chronic cystitis    Other sign and symptom in breast    Paroxysmal atrial fibrillation (HCC) 11/30/2012   Pneumonia    "I've had walking pneumonia a couple times"  (06/22/2016)   Pneumonia dx'd 06/15/2016   Rectal incontinence    Stroke (HCC) 2014   hx of mini stroke    TIA (transient ischemic attack) 2015   Past Surgical History:  Procedure Laterality Date   CARDIOVERSION N/A 06/29/2016   Procedure: CARDIOVERSION;  Surgeon: Jake Bathe, MD;  Location: [facility] OR;  Service: Cardiovascular;  Laterality: N/A;   CATARACT EXTRACTION W/ INTRAOCULAR LENS IMPLANT Left    DILATION AND CURETTAGE OF UTERUS     hiatel hernia  08/20/2016   HYSTEROSCOPY WITH D & C  03/16/1999   INSERTION OF MESH N/A 08/20/2016   Procedure: INSERTION OF MESH;  Surgeon: Axel Filler, MD;  Location: [facility] ORS;  Service: General;  Laterality: N/A;   TEE WITHOUT CARDIOVERSION N/A 11/06/2012   Procedure: TRANSESOPHAGEAL ECHOCARDIOGRAM (TEE);  Surgeon: Lewayne Bunting, MD;  Location: [facility] ENDOSCOPY;  Service: Cardiovascular;  Laterality: N/A;   TONSILLECTOMY  1937   VAGINAL HYSTERECTOMY  09/2005   Anterior repair; Removal of urethral caruncle./notes 09/02/2015    Allergies  Allergen Reactions   Lactose Other (See Comments)    Dairy Sensitivity  Dairy Sensitivity     Lisinopril Other (See Comments)    DIZZY   Other     SEASONAL ALLERGIES   Bactrim  [Sulfamethoxazole-Trimethoprim] Rash    Outpatient Encounter Medications as of 12/09/2022  Medication Sig   acetaminophen (TYLENOL) 500 MG tablet Take 500 mg by mouth every morning. And one tablet by mouth once a morning.   acetaminophen (TYLENOL) 500 MG tablet Take 500 mg by mouth every 6 (six) hours as needed.   ALPRAZolam (XANAX) 0.25 MG tablet Take 1 tablet (0.25 mg total) by mouth at bedtime.   amiodarone (PACERONE) 100 MG tablet Take 100 mg by mouth every morning.   CALCIUM CARBONATE-VIT D-MIN PO Take 2 tablets by mouth 3 (three) times a week.    CRANBERRY PO Take 450 mg by mouth daily.   docusate sodium (COLACE) 100 MG capsule Take 100 mg by mouth daily.   ELIQUIS 5 MG TABS tablet TAKE 1 TABLET BY MOUTH TWICE DAILY   escitalopram (LEXAPRO) 10 MG tablet Take 20 mg by mouth daily.   estradiol (ESTRACE) 0.1 MG/GM vaginal cream Place 1 Applicatorful vaginally 3 (three) times a week.   glucosamine-chondroitin 500-400 MG tablet Take 1 tablet by mouth in the morning and at bedtime.   lactase (LACTAID) 3000 units tablet Take 9,000 Units by mouth in the morning.   loperamide (IMODIUM A-D) 2 MG tablet Take 2 mg by mouth as needed for diarrhea or loose stools.   midodrine (PROAMATINE) 5 MG tablet Take 5 mg by mouth 2 (two) times daily with a meal. Hold if SBP sitting is >150   nitrofurantoin, macrocrystal-monohydrate, (MACROBID) 100 MG capsule Take 100 mg by mouth at bedtime.   ondansetron (ZOFRAN) 4 MG tablet Take 1 tablet (4 mg total) by mouth every 8 (eight) hours as needed for nausea or vomiting.   Polyethyl Glycol-Propyl Glycol (SYSTANE OP) Place 1 drop into both eyes 2 (two) times daily as needed (for dry eyes).    polyethylene glycol (MIRALAX) 17 g packet Take 17 g by mouth 2 (two) times a week.   pravastatin (PRAVACHOL) 40 MG tablet TAKE 1 TABLET(40 MG) BY MOUTH DAILY   Probiotic Product (PROBIOTIC DIGESTIVE SUPP PO) Take 1 capsule by mouth once.   No facility-administered encounter  medications on file as of 12/09/2022.    Review of Systems  Constitutional:  Positive for appetite change and unexpected weight change. Negative for activity change, chills, diaphoresis, fatigue and fever.  HENT:  Negative for congestion.   Respiratory:  Negative for cough, shortness of breath and wheezing.   Cardiovascular:  Positive for leg swelling. Negative for chest pain and palpitations.  Gastrointestinal:  Negative for abdominal distention, abdominal pain, constipation and diarrhea.  Genitourinary:  Positive for frequency. Negative for decreased urine volume, difficulty urinating, dysuria, flank pain, hematuria, pelvic pain, urgency, vaginal bleeding and vaginal discharge.  Musculoskeletal:  Positive for back pain (intermittent low back pain) and gait problem. Negative for arthralgias, joint swelling and myalgias.  Neurological:  Positive for dizziness (chronic  when standing due to low bp). Negative for tremors, seizures, syncope, facial asymmetry, speech difficulty, weakness, light-headedness, numbness and headaches.  Psychiatric/Behavioral:  Positive for confusion and dysphoric mood. Negative for agitation and behavioral problems. The patient is nervous/anxious.     Immunization History  Administered Date(s) Administered   Fluad Quad(high Dose 65+) 01/09/2019, 01/19/2022   Influenza Split 12/18/2013   Influenza, High Dose Seasonal PF 01/24/2018   Influenza-Unspecified 01/12/2015, 01/13/2016, 01/20/2017, 01/24/2018, 01/06/2021   Moderna Covid-19 Vaccine Bivalent Booster 34yrs & up 01/30/2021   Moderna Sars-Covid-2 Vaccination 05/01/2019, 05/29/2019, 03/04/2020   Pneumococcal Conjugate-13 10/02/2013   Pneumococcal Polysaccharide-23 12/09/2014   Td 05/16/1995, 12/10/2004   Tdap 04/19/2004, 02/20/2016   Zoster Recombinant(Shingrix) 10/29/2016, 03/21/2017   Zoster, Live 02/11/2006   Pertinent  Health Maintenance Due  Topic Date Due   INFLUENZA VACCINE  11/18/2022   DEXA SCAN   Completed   MAMMOGRAM  Discontinued      01/25/2022    7:50 PM 05/11/2022    1:13 PM 06/28/2022    1:49 PM 08/10/2022    2:40 PM 08/16/2022    9:54 AM  Fall Risk  Falls in the past year?  1 1 1 1   Was there an injury with Fall?  0 0 0 0  Fall Risk Category Calculator  1 2 1 1   (RETIRED) Patient Fall Risk Level High fall risk      Patient at Risk for Falls Due to  Impaired mobility History of fall(s) History of fall(s) History of fall(s)  Fall risk Follow up  Falls evaluation completed Falls evaluation completed Falls evaluation completed Falls evaluation completed   Functional Status Survey:    Vitals:   12/09/22 1549  BP: 100/60  Pulse: (!) 57  Resp: 18  Temp: (!) 97.2 F (36.2 C)  SpO2: 93%   There is no height or weight on file to calculate BMI. Physical Exam Vitals and nursing note reviewed.  Constitutional:      General: She is not in acute distress.    Appearance: She is not diaphoretic.  HENT:     Head: Normocephalic and atraumatic.  Neck:     Vascular: No JVD.  Cardiovascular:     Rate and Rhythm: Normal rate and regular rhythm.     Heart sounds: No murmur heard. Pulmonary:     Effort: Pulmonary effort is normal. No respiratory distress.     Breath sounds: Normal breath sounds. No wheezing.  Abdominal:     General: Bowel sounds are normal. There is no distension.     Palpations: Abdomen is soft.     Tenderness: There is no abdominal tenderness. There is no right CVA tenderness or left CVA tenderness.  Musculoskeletal:     Comments: BLE edema +1  Skin:    General: Skin is warm and dry.  Neurological:     General: No focal deficit present.     Mental Status: She is alert. Mental status is at baseline.  Psychiatric:        Mood and Affect: Mood normal.     Labs reviewed: Recent Labs    01/24/22 1146 01/25/22 0020 01/26/22 0105 02/01/22 0000 05/20/22 1356 05/31/22 0000 08/20/22 0000 09/02/22 0000 11/28/22 0523  NA 141 141 139   < > 136   < >  142 137 141  K 3.8 3.9 3.8   < > 3.9   < > 4.3 4.5 3.7  CL 107 107 106   < > 102   < >  107 100 106  CO2 26 23 24    < > 26   < > 23* 27* 23  GLUCOSE 102* 97 107*  --  116*  --   --   --  103*  BUN 20 21 13    < > 17   < > 19 16 21   CREATININE 0.74 0.81 0.65   < > 0.99   < > 0.8 0.8 1.15*  CALCIUM 9.1 8.9 8.7*   < > 8.9   < > 8.6* 9.1 8.8*  MG 2.0 1.9 2.0  --   --   --   --   --   --    < > = values in this interval not displayed.   Recent Labs    01/19/22 1946 05/20/22 1356 11/28/22 0523  AST 30 24 20   ALT 18 15 14   ALKPHOS 61 43 43  BILITOT 0.6 0.5 0.4  PROT 8.5* 6.7 6.6  ALBUMIN 4.6 3.6 3.4*   Recent Labs    01/19/22 1946 01/21/22 0018 01/22/22 0545 01/23/22 0038 11/28/22 0523  WBC 8.9   < > 8.6 8.7 6.2  NEUTROABS 6.4  --   --   --  4.5  HGB 15.9*   < > 12.7 12.3 12.3  HCT 47.7*   < > 38.5 37.9 40.2  MCV 92.4   < > 91.4 92.0 91.6  PLT 263   < > 229 269 246   < > = values in this interval not displayed.   Lab Results  Component Value Date   TSH 3.663 05/20/2022   Lab Results  Component Value Date   HGBA1C 5.9 (A) 12/04/2019   Lab Results  Component Value Date   CHOL 147 05/31/2022   HDL 62 05/31/2022   LDLCALC 62 05/31/2022   TRIG 115 05/31/2022   CHOLHDL 3.1 04/24/2018    Significant Diagnostic Results in last 30 days:  No results found.  Assessment/Plan 1. Recurrent UTI Currently improving symptoms of frequency On macrodantin for prophylaxis UA C and S did not show UTI If symptoms persist recollect  2. Reactive Depression Seen by psych lexapro increased and Remeron added.        Family/ staff Communication: nurse  Labs/tests ordered:  repeat UA C and S

## 2022-12-10 ENCOUNTER — Encounter: Payer: Self-pay | Admitting: Adult Health

## 2022-12-10 ENCOUNTER — Non-Acute Institutional Stay (SKILLED_NURSING_FACILITY): Payer: Medicare Other | Admitting: Adult Health

## 2022-12-10 DIAGNOSIS — M25552 Pain in left hip: Secondary | ICD-10-CM | POA: Diagnosis not present

## 2022-12-10 DIAGNOSIS — F329 Major depressive disorder, single episode, unspecified: Secondary | ICD-10-CM

## 2022-12-10 NOTE — Progress Notes (Addendum)
Location:  Medical illustrator of Service:  SNF (31) Provider:   Peggye Ley, ANP [facility] [phone]   Mahlon Gammon, MD  Patient Care Team: Mahlon Gammon, MD as PCP - General (Internal Medicine) [place], [name], MD as PCP - Cardiology (Cardiology) [place], [name], MD as Consulting Physician (Cardiology) Vanessa Barbara, NP as Nurse Practitioner Noel Christmas, MD as Consulting Physician (Urology) Danice Goltz, [place] as Physician Assistant (Cardiology) Ronnald Nian, MD as Consulting Physician (Family Medicine) Noel Christmas, MD as Consulting Physician (Urology)  Extended Emergency Contact Information Primary Emergency Contact: Remmers,Dr. Alphia Moh, [place] of [place] Home Phone: [phone] Mobile Phone: [phone] Relation: Son Secondary Emergency Contact: [facility] Address: [address]          [place], [place] [address] D[place] of [place] Home Phone: [phone] Relation: Daughter  Code Status:  DNR Goals of care: Advanced Directive information    11/15/2022   11:33 AM  Advanced Directives  Does Patient Have a Medical Advance Directive? Yes  Type of Estate agent of [place];Living will;Out of facility DNR (pink MOST or yellow form)  Does patient want to make changes to medical advance directive? No - Patient declined  Copy of Healthcare Power of Attorney in Chart? No - copy requested     Chief Complaint  Patient presents with   Acute Visit    Fall with hip pain    HPI:  Pt is a 89 y.o. female seen today for an acute visit for a fall with left hip pain  Pt had a fall last night around 3 am. She was found in the room on the floor. Afterward she reported left hip pain. There is no swelling or deformity. She is having trouble bearing weight. Pain is mild to moderate. Currently taking tylenol. No numbness or tingling.   She has underlying  dementia and can't remember how she fell. She has a hx of falls due to gait issues, dementia, and safety awareness. Also she has chronic labile bp which contributes to her fall risk. She was found alert and did not appear to have hit her head.   She was seen on 8/22 due to urinary frequency. UA C and S negative. Symptoms improving  She was started on remeron and lexapro increased 1 day ago due to anxiety, weight loss, and depression one day ago. She is alert and following commands for the visit.    Past Medical History:  Diagnosis Date   Allergic rhinitis    Arthritis    "hands, little in my feet" (06/22/2016)   Candidiasis of vulva and vagina    Cystocele, midline    Disorder of bone and cartilage, unspecified    Fibroids    Hypertension    Large hiatal hernia 06/23/2016   Migraine    "none in the last few years" (06/22/2016)   Osteopenia    Other chronic cystitis    Other sign and symptom in breast    Paroxysmal atrial fibrillation (HCC) 11/30/2012   Pneumonia    "I've had walking pneumonia a couple times"  (06/22/2016)   Pneumonia dx'd 06/15/2016   Rectal incontinence    Stroke (HCC) 2014   hx of mini stroke    TIA (transient ischemic attack) 2015   Past Surgical History:  Procedure Laterality Date   CARDIOVERSION N/A 06/29/2016   Procedure: CARDIOVERSION;  Surgeon: Jake Bathe, MD;  Location: [facility] OR;  Service: Cardiovascular;  Laterality: N/A;   CATARACT EXTRACTION W/ INTRAOCULAR LENS IMPLANT Left    DILATION AND CURETTAGE OF UTERUS     hiatel hernia  08/20/2016   HYSTEROSCOPY WITH D & C  03/16/1999   INSERTION OF MESH N/A 08/20/2016   Procedure: INSERTION OF MESH;  Surgeon: Axel Filler, MD;  Location: [facility] ORS;  Service: General;  Laterality: N/A;   TEE WITHOUT CARDIOVERSION N/A 11/06/2012   Procedure: TRANSESOPHAGEAL ECHOCARDIOGRAM (TEE);  Surgeon: Lewayne Bunting, MD;  Location: [facility] ENDOSCOPY;  Service: Cardiovascular;  Laterality: N/A;   TONSILLECTOMY  1937    VAGINAL HYSTERECTOMY  09/2005   Anterior repair; Removal of urethral caruncle./notes 09/02/2015    Allergies  Allergen Reactions   Lactose Other (See Comments)    Dairy Sensitivity  Dairy Sensitivity     Lisinopril Other (See Comments)    DIZZY   Other     SEASONAL ALLERGIES   Bactrim [Sulfamethoxazole-Trimethoprim] Rash    Outpatient Encounter Medications as of 12/10/2022  Medication Sig   acetaminophen (TYLENOL) 500 MG tablet Take 500 mg by mouth every morning. And one tablet by mouth once a morning.   acetaminophen (TYLENOL) 500 MG tablet Take 500 mg by mouth every 6 (six) hours as needed.   ALPRAZolam (XANAX) 0.25 MG tablet Take 1 tablet (0.25 mg total) by mouth at bedtime.   amiodarone (PACERONE) 100 MG tablet Take 100 mg by mouth every morning.   CALCIUM CARBONATE-VIT D-MIN PO Take 2 tablets by mouth 3 (three) times a week.    CRANBERRY PO Take 450 mg by mouth daily.   docusate sodium (COLACE) 100 MG capsule Take 100 mg by mouth daily.   ELIQUIS 5 MG TABS tablet TAKE 1 TABLET BY MOUTH TWICE DAILY   escitalopram (LEXAPRO) 10 MG tablet Take 20 mg by mouth daily.   estradiol (ESTRACE) 0.1 MG/GM vaginal cream Place 1 Applicatorful vaginally 3 (three) times a week.   glucosamine-chondroitin 500-400 MG tablet Take 1 tablet by mouth in the morning and at bedtime.   lactase (LACTAID) 3000 units tablet Take 9,000 Units by mouth in the morning.   loperamide (IMODIUM A-D) 2 MG tablet Take 2 mg by mouth as needed for diarrhea or loose stools.   midodrine (PROAMATINE) 5 MG tablet Take 5 mg by mouth 2 (two) times daily with a meal. Hold if SBP sitting is >150   mirtazapine (REMERON) 30 MG tablet Take 1 tablet (30 mg total) by mouth at bedtime.   nitrofurantoin, macrocrystal-monohydrate, (MACROBID) 100 MG capsule Take 100 mg by mouth at bedtime.   ondansetron (ZOFRAN) 4 MG tablet Take 1 tablet (4 mg total) by mouth every 8 (eight) hours as needed for nausea or vomiting.   Polyethyl  Glycol-Propyl Glycol (SYSTANE OP) Place 1 drop into both eyes 2 (two) times daily as needed (for dry eyes).    polyethylene glycol (MIRALAX) 17 g packet Take 17 g by mouth 2 (two) times a week.   pravastatin (PRAVACHOL) 40 MG tablet TAKE 1 TABLET(40 MG) BY MOUTH DAILY   Probiotic Product (PROBIOTIC DIGESTIVE SUPP PO) Take 1 capsule by mouth once.   No facility-administered encounter medications on file as of 12/10/2022.    Review of Systems  Constitutional:  Positive for appetite change and unexpected weight change. Negative for activity change, chills, diaphoresis, fatigue and fever.  HENT:  Negative for congestion.   Respiratory:  Negative for cough, shortness of breath and  wheezing.   Cardiovascular:  Positive for leg swelling. Negative for chest pain and palpitations.  Gastrointestinal:  Negative for abdominal distention, abdominal pain, constipation and diarrhea.  Genitourinary:  Negative for difficulty urinating, dysuria, frequency, pelvic pain and urgency.  Musculoskeletal:  Positive for arthralgias (left hip) and gait problem. Negative for back pain, joint swelling and myalgias.  Neurological:  Positive for dizziness (chronic when standing). Negative for tremors, seizures, syncope, facial asymmetry, speech difficulty, weakness, light-headedness, numbness and headaches.  Psychiatric/Behavioral:  Positive for confusion and dysphoric mood. Negative for agitation and behavioral problems. The patient is nervous/anxious.     Immunization History  Administered Date(s) Administered   Fluad Quad(high Dose 65+) 01/09/2019, 01/19/2022   Influenza Split 12/18/2013   Influenza, High Dose Seasonal PF 01/24/2018   Influenza-Unspecified 01/12/2015, 01/13/2016, 01/20/2017, 01/24/2018, 01/06/2021   Moderna Covid-19 Vaccine Bivalent Booster 38yrs & up 01/30/2021   Moderna Sars-Covid-2 Vaccination 05/01/2019, 05/29/2019, 03/04/2020   Pneumococcal Conjugate-13 10/02/2013   Pneumococcal  Polysaccharide-23 12/09/2014   Td 05/16/1995, 12/10/2004   Tdap 04/19/2004, 02/20/2016   Zoster Recombinant(Shingrix) 10/29/2016, 03/21/2017   Zoster, Live 02/11/2006   Pertinent  Health Maintenance Due  Topic Date Due   INFLUENZA VACCINE  11/18/2022   DEXA SCAN  Completed   MAMMOGRAM  Discontinued      01/25/2022    7:50 PM 05/11/2022    1:13 PM 06/28/2022    1:49 PM 08/10/2022    2:40 PM 08/16/2022    9:54 AM  Fall Risk  Falls in the past year?  1 1 1 1   Was there an injury with Fall?  0 0 0 0  Fall Risk Category Calculator  1 2 1 1   (RETIRED) Patient Fall Risk Level High fall risk      Patient at Risk for Falls Due to  Impaired mobility History of fall(s) History of fall(s) History of fall(s)  Fall risk Follow up  Falls evaluation completed Falls evaluation completed Falls evaluation completed Falls evaluation completed   Functional Status Survey:    Vitals:   12/10/22 1246 12/10/22 1247  BP: (!) 190/78 (!) 165/61  Pulse: 79   Resp: (!) 22   Temp: 97.9 F (36.6 C)   SpO2: 98%    There is no height or weight on file to calculate BMI. Physical Exam Vitals and nursing note reviewed.  Constitutional:      General: She is not in acute distress.    Appearance: She is not diaphoretic.  HENT:     Head: Normocephalic and atraumatic.  Eyes:     Extraocular Movements: Extraocular movements intact.     Conjunctiva/sclera: Conjunctivae normal.     Pupils: Pupils are equal, round, and reactive to light.  Neck:     Vascular: No JVD.  Cardiovascular:     Rate and Rhythm: Normal rate and regular rhythm.     Heart sounds: No murmur heard. Pulmonary:     Effort: Pulmonary effort is normal. No respiratory distress.     Breath sounds: Normal breath sounds. No wheezing.  Abdominal:     General: Bowel sounds are normal. There is no distension.     Palpations: Abdomen is soft.     Tenderness: There is no abdominal tenderness. There is no right CVA tenderness or left CVA  tenderness.  Musculoskeletal:        General: No swelling, tenderness or deformity.     Comments: No pain with ROM of the left hip. No deformity. No bruising or swelling. Strength  4/5 to BLE BLE edema +1 No pain with ROM of either shoulder or elbow No pain with ROM to either knee or ankle. No pain with ROM of right hip.   Skin:    General: Skin is warm and dry.  Neurological:     General: No focal deficit present.     Mental Status: She is alert. Mental status is at baseline.     Cranial Nerves: No cranial nerve deficit.  Psychiatric:        Mood and Affect: Mood normal.     Labs reviewed: Recent Labs    01/24/22 1146 01/25/22 0020 01/26/22 0105 02/01/22 0000 05/20/22 1356 05/31/22 0000 08/20/22 0000 09/02/22 0000 11/28/22 0523  NA 141 141 139   < > 136   < > 142 137 141  K 3.8 3.9 3.8   < > 3.9   < > 4.3 4.5 3.7  CL 107 107 106   < > 102   < > 107 100 106  CO2 26 23 24    < > 26   < > 23* 27* 23  GLUCOSE 102* 97 107*  --  116*  --   --   --  103*  BUN 20 21 13    < > 17   < > 19 16 21   CREATININE 0.74 0.81 0.65   < > 0.99   < > 0.8 0.8 1.15*  CALCIUM 9.1 8.9 8.7*   < > 8.9   < > 8.6* 9.1 8.8*  MG 2.0 1.9 2.0  --   --   --   --   --   --    < > = values in this interval not displayed.   Recent Labs    01/19/22 1946 05/20/22 1356 11/28/22 0523  AST 30 24 20   ALT 18 15 14   ALKPHOS 61 43 43  BILITOT 0.6 0.5 0.4  PROT 8.5* 6.7 6.6  ALBUMIN 4.6 3.6 3.4*   Recent Labs    01/19/22 1946 01/21/22 0018 01/22/22 0545 01/23/22 0038 11/28/22 0523  WBC 8.9   < > 8.6 8.7 6.2  NEUTROABS 6.4  --   --   --  4.5  HGB 15.9*   < > 12.7 12.3 12.3  HCT 47.7*   < > 38.5 37.9 40.2  MCV 92.4   < > 91.4 92.0 91.6  PLT 263   < > 229 269 246   < > = values in this interval not displayed.   Lab Results  Component Value Date   TSH 3.663 05/20/2022   Lab Results  Component Value Date   HGBA1C 5.9 (A) 12/04/2019   Lab Results  Component Value Date   CHOL 147 05/31/2022    HDL 62 05/31/2022   LDLCALC 62 05/31/2022   TRIG 115 05/31/2022   CHOLHDL 3.1 04/24/2018    Significant Diagnostic Results in last 30 days:  No results found.  Assessment/Plan 1. Left hip pain Use tylenol prn for pain Xray bilateral pelvis and 2 view of the left hip due to pain post fall  2. Reactive depression She was started on Remeron 30 mg and Lexapro increased to 20 mg on 12/09/22 by Dr Donell Beers.  She does not appear groggy or confused from her baseline. Will monitor for s/e and response to therapy     Family/ staff Communication: nurse  Labs/tests ordered:  xray

## 2022-12-13 ENCOUNTER — Non-Acute Institutional Stay (SKILLED_NURSING_FACILITY): Payer: Medicare Other | Admitting: Internal Medicine

## 2022-12-13 ENCOUNTER — Encounter: Payer: Self-pay | Admitting: Internal Medicine

## 2022-12-13 DIAGNOSIS — I480 Paroxysmal atrial fibrillation: Secondary | ICD-10-CM

## 2022-12-13 DIAGNOSIS — F329 Major depressive disorder, single episode, unspecified: Secondary | ICD-10-CM | POA: Diagnosis not present

## 2022-12-13 DIAGNOSIS — W19XXXA Unspecified fall, initial encounter: Secondary | ICD-10-CM | POA: Diagnosis not present

## 2022-12-13 DIAGNOSIS — F0390 Unspecified dementia without behavioral disturbance: Secondary | ICD-10-CM | POA: Diagnosis not present

## 2022-12-13 DIAGNOSIS — M79604 Pain in right leg: Secondary | ICD-10-CM

## 2022-12-13 DIAGNOSIS — N390 Urinary tract infection, site not specified: Secondary | ICD-10-CM

## 2022-12-13 NOTE — Progress Notes (Addendum)
Location: Medical illustrator of Service:  SNF (31)  Provider:   Code Status: DNR Goals of Care:     11/15/2022   11:33 AM  Advanced Directives  Does Patient Have a Medical Advance Directive? Yes  Type of Estate agent of [place];Living will;Out of facility DNR (pink MOST or yellow form)  Does patient want to make changes to medical advance directive? No - Patient declined  Copy of Healthcare Power of Attorney in Chart? No - copy requested     Chief Complaint  Patient presents with   Acute Visit    HPI: Patient is a 89 y.o. female seen today for an acute visit for Fall and Right Leg Pain  Lives in [place] in [facility]   Patient has a history of PAF on Eliquis,  recurrent UTI and is on Macrobid,  HLD, hypertension,  IBS with diarrhea, anxiety.   Depression Orthostatic Hypotension on Midodrine  Patient was found on the floor by her family According to the patient she was trying to walk from sofa to her bed when she fell She says her legs are weak and she fell  No Pain  No Dizziness Patient had one other fall  few days ago on 12/10/22 She recently had Remeron added at night and Lexapro increased as she was having delusion  But per nurses she has been getting cognitively worse Also getting more weaker No Change in the weight Family in the room        Past Medical History:  Diagnosis Date   Allergic rhinitis    Arthritis    "hands, little in my feet" (06/22/2016)   Candidiasis of vulva and vagina    Cystocele, midline    Disorder of bone and cartilage, unspecified    Fibroids    Hypertension    Large hiatal hernia 06/23/2016   Migraine    "none in the last few years" (06/22/2016)   Osteopenia    Other chronic cystitis    Other sign and symptom in breast    Paroxysmal atrial fibrillation (HCC) 11/30/2012   Pneumonia    "I've had walking pneumonia a couple times"  (06/22/2016)   Pneumonia dx'd 06/15/2016   Rectal incontinence     Stroke (HCC) 2014   hx of mini stroke    TIA (transient ischemic attack) 2015    Past Surgical History:  Procedure Laterality Date   CARDIOVERSION N/A 06/29/2016   Procedure: CARDIOVERSION;  Surgeon: Jake Bathe, MD;  Location: [facility];  Service: Cardiovascular;  Laterality: N/A;   CATARACT EXTRACTION W/ INTRAOCULAR LENS IMPLANT Left    DILATION AND CURETTAGE OF UTERUS     hiatel hernia  08/20/2016   HYSTEROSCOPY WITH D & C  03/16/1999   INSERTION OF MESH N/A 08/20/2016   Procedure: INSERTION OF MESH;  Surgeon: Axel Filler, MD;  Location: [facility]S;  Service: General;  Laterality: N/A;   TEE WITHOUT CARDIOVERSION N/A 11/06/2012   Procedure: TRANSESOPHAGEAL ECHOCARDIOGRAM (TEE);  Surgeon: Lewayne Bunting, MD;  Location: [facility] ENDOSCOPY;  Service: Cardiovascular;  Laterality: N/A;   TONSILLECTOMY  1937   VAGINAL HYSTERECTOMY  09/2005   Anterior repair; Removal of urethral caruncle./notes 09/02/2015    Allergies  Allergen Reactions   Lactose Other (See Comments)    Dairy Sensitivity  Dairy Sensitivity     Lisinopril Other (See Comments)    DIZZY   Other     SEASONAL ALLERGIES   Bactrim [Sulfamethoxazole-Trimethoprim] Rash  Outpatient Encounter Medications as of 12/13/2022  Medication Sig   acetaminophen (TYLENOL) 500 MG tablet Take 500 mg by mouth every morning. And one tablet by mouth once a morning.   acetaminophen (TYLENOL) 500 MG tablet Take 500 mg by mouth every 6 (six) hours as needed.   ALPRAZolam (XANAX) 0.25 MG tablet Take 1 tablet (0.25 mg total) by mouth at bedtime.   amiodarone (PACERONE) 100 MG tablet Take 100 mg by mouth every morning.   CALCIUM CARBONATE-VIT D-MIN PO Take 2 tablets by mouth 3 (three) times a week.    CRANBERRY PO Take 450 mg by mouth daily.   docusate sodium (COLACE) 100 MG capsule Take 100 mg by mouth daily.   ELIQUIS 5 MG TABS tablet TAKE 1 TABLET BY MOUTH TWICE DAILY   escitalopram (LEXAPRO) 10 MG tablet Take 20 mg by mouth daily.    estradiol (ESTRACE) 0.1 MG/GM vaginal cream Place 1 Applicatorful vaginally 3 (three) times a week.   glucosamine-chondroitin 500-400 MG tablet Take 1 tablet by mouth in the morning and at bedtime.   lactase (LACTAID) 3000 units tablet Take 9,000 Units by mouth in the morning.   loperamide (IMODIUM A-D) 2 MG tablet Take 2 mg by mouth as needed for diarrhea or loose stools.   midodrine (PROAMATINE) 5 MG tablet Take 5 mg by mouth 2 (two) times daily with a meal. Hold if SBP sitting is >150   mirtazapine (REMERON) 30 MG tablet Take 1 tablet (30 mg total) by mouth at bedtime.   nitrofurantoin, macrocrystal-monohydrate, (MACROBID) 100 MG capsule Take 100 mg by mouth at bedtime.   ondansetron (ZOFRAN) 4 MG tablet Take 1 tablet (4 mg total) by mouth every 8 (eight) hours as needed for nausea or vomiting.   Polyethyl Glycol-Propyl Glycol (SYSTANE OP) Place 1 drop into both eyes 2 (two) times daily as needed (for dry eyes).    polyethylene glycol (MIRALAX) 17 g packet Take 17 g by mouth 2 (two) times a week.   pravastatin (PRAVACHOL) 40 MG tablet TAKE 1 TABLET(40 MG) BY MOUTH DAILY   Probiotic Product (PROBIOTIC DIGESTIVE SUPP PO) Take 1 capsule by mouth once.   No facility-administered encounter medications on file as of 12/13/2022.    Review of Systems:  Review of Systems  Constitutional:  Positive for activity change. Negative for appetite change.  HENT: Negative.    Respiratory:  Negative for cough and shortness of breath.   Cardiovascular:  Negative for leg swelling.  Gastrointestinal:  Negative for constipation.  Genitourinary: Negative.   Musculoskeletal:  Positive for arthralgias and gait problem. Negative for myalgias.  Skin: Negative.   Neurological:  Negative for dizziness and weakness.  Psychiatric/Behavioral:  Positive for confusion and dysphoric mood. Negative for sleep disturbance.     Health Maintenance  Topic Date Due   Medicare Annual Wellness (AWV)  12/14/2021   COVID-19  Vaccine (5 - 2023-24 season) 12/18/2021   INFLUENZA VACCINE  11/18/2022   DTaP/Tdap/Td (5 - Td or Tdap) 02/19/2026   Pneumonia Vaccine 22+ Years old  Completed   DEXA SCAN  Completed   Zoster Vaccines- Shingrix  Completed   HPV VACCINES  Aged Out   MAMMOGRAM  Discontinued    Physical Exam: Vitals:   12/13/22 1602  BP: 118/70  Pulse: 64  Resp: 16  Temp: 98.1 F (36.7 C)  Weight: 130 lb 12.8 oz (59.3 kg)   Body mass index is 21.11 kg/m. Physical Exam Vitals reviewed.  Constitutional:  Appearance: Normal appearance.  HENT:     Head: Normocephalic.     Nose: Nose normal.     Mouth/Throat:     Mouth: Mucous membranes are moist.     Pharynx: Oropharynx is clear.  Eyes:     Pupils: Pupils are equal, round, and reactive to light.  Cardiovascular:     Rate and Rhythm: Normal rate. Rhythm irregular.     Pulses: Normal pulses.     Heart sounds: Normal heart sounds. No murmur heard. Pulmonary:     Effort: Pulmonary effort is normal.     Breath sounds: Normal breath sounds.  Abdominal:     General: Abdomen is flat. Bowel sounds are normal.     Palpations: Abdomen is soft.  Musculoskeletal:        General: Swelling present.     Cervical back: Neck supple.     Comments: No pain on her Right or Left Hip on abduction or Adduction She initially was not able to stand up but then she was with Mild Assist No Pain or discomfort  Skin:    General: Skin is warm.  Neurological:     General: No focal deficit present.     Mental Status: She is alert.  Psychiatric:        Mood and Affect: Mood normal.        Thought Content: Thought content normal.     Labs reviewed: Basic Metabolic Panel: Recent Labs    01/19/22 1846 01/19/22 1946 01/24/22 1146 01/25/22 0020 01/26/22 0105 02/01/22 0000 05/20/22 1356 05/20/22 1430 05/31/22 0000 08/20/22 0000 09/02/22 0000 11/28/22 0523  NA  --    < > 141 141 139   < > 136  --    < > 142 137 141  K  --    < > 3.8 3.9 3.8   < > 3.9   --    < > 4.3 4.5 3.7  CL  --    < > 107 107 106   < > 102  --    < > 107 100 106  CO2  --    < > 26 23 24    < > 26  --    < > 23* 27* 23  GLUCOSE  --    < > 102* 97 107*  --  116*  --   --   --   --  103*  BUN  --    < > 20 21 13    < > 17  --    < > 19 16 21   CREATININE  --    < > 0.74 0.81 0.65   < > 0.99  --    < > 0.8 0.8 1.15*  CALCIUM  --    < > 9.1 8.9 8.7*   < > 8.9  --    < > 8.6* 9.1 8.8*  MG  --    < > 2.0 1.9 2.0  --   --   --   --   --   --   --   TSH 2.428  --   --   --   --   --   --  3.663  --   --   --   --    < > = values in this interval not displayed.   Liver Function Tests: Recent Labs    01/19/22 1946 05/20/22 1356 11/28/22 0523  AST 30 24 20   ALT 18 15 14  ALKPHOS 61 43 43  BILITOT 0.6 0.5 0.4  PROT 8.5* 6.7 6.6  ALBUMIN 4.6 3.6 3.4*   Recent Labs    11/28/22 0523  LIPASE 28   No results for input(s): "AMMONIA" in the last 8760 hours. CBC: Recent Labs    01/19/22 1946 01/21/22 0018 01/22/22 0545 01/23/22 0038 11/28/22 0523  WBC 8.9   < > 8.6 8.7 6.2  NEUTROABS 6.4  --   --   --  4.5  HGB 15.9*   < > 12.7 12.3 12.3  HCT 47.7*   < > 38.5 37.9 40.2  MCV 92.4   < > 91.4 92.0 91.6  PLT 263   < > 229 269 246   < > = values in this interval not displayed.   Lipid Panel: Recent Labs    05/31/22 0000  CHOL 147  HDL 62  LDLCALC 62  TRIG 115   Lab Results  Component Value Date   HGBA1C 5.9 (A) 12/04/2019    Procedures since last visit: No results found.  Assessment/Plan 1. Fall, initial encounter Continue to try ot walk without assist and falls Getting more weaker Per staff she is stand and pivot and should not try to walk buy herself Will Check CBC,BMP  Also EKG Therapy to evaluate 2. Right leg pain Xray of right hip and Pelvis  3. Reactive depression Just started on Remeron Also Lexapro was increased   4. Dementia without behavioral disturbance [facility]) Recently Cognitively declined per staff and her son ST to evaluate    5. Recurrent UTI On Macrodantin and Estrace  6. Paroxysmal atrial fibrillation (HCC) On Eliquis and Amiodarone    Labs/tests ordered:  CBC,BMP Next appt:  Visit date not found

## 2022-12-13 NOTE — Addendum Note (Signed)
Addended by: Cephus Richer on: 12/13/2022 04:34 PM   Modules accepted: Level of Service

## 2022-12-14 ENCOUNTER — Non-Acute Institutional Stay: Payer: Self-pay | Admitting: Orthopedic Surgery

## 2022-12-14 ENCOUNTER — Encounter: Payer: Self-pay | Admitting: Orthopedic Surgery

## 2022-12-14 DIAGNOSIS — B370 Candidal stomatitis: Secondary | ICD-10-CM

## 2022-12-14 DIAGNOSIS — K0889 Other specified disorders of teeth and supporting structures: Secondary | ICD-10-CM

## 2022-12-14 LAB — CBC AND DIFFERENTIAL
HCT: 36 (ref 36–46)
Hemoglobin: 11.9 — AB (ref 12.0–16.0)
Platelets: 247 10*3/uL (ref 150–400)
WBC: 8.4

## 2022-12-14 LAB — COMPREHENSIVE METABOLIC PANEL
Calcium: 8.7 (ref 8.7–10.7)
eGFR: 78

## 2022-12-14 LAB — BASIC METABOLIC PANEL
BUN: 22 — AB (ref 4–21)
CO2: 24 — AB (ref 13–22)
Chloride: 108 (ref 99–108)
Creatinine: 0.7 (ref 0.5–1.1)
Glucose: 98
Potassium: 4.1 meq/L (ref 3.5–5.1)
Sodium: 144 (ref 137–147)

## 2022-12-14 LAB — CBC: RBC: 4.07 (ref 3.87–5.11)

## 2022-12-14 MED ORDER — NYSTATIN NICU ORAL SYRINGE 100,000 UNITS/ML
4.0000 mL | Freq: Four times a day (QID) | OROMUCOSAL | Status: AC
Start: 2022-12-14 — End: 2022-12-24

## 2022-12-14 NOTE — Progress Notes (Signed)
[facility] Room Number: 134-A Place of Service:  SNF [phone]) Provider:  Hazle Nordmann, NP  PCP: Mahlon Gammon, MD  Patient Care Team: Mahlon Gammon, MD as PCP - General (Internal Medicine) [place], [name], MD as PCP - Cardiology (Cardiology) [place], [name], MD as Consulting Physician (Cardiology) Vanessa Barbara, NP as Nurse Practitioner Noel Christmas, MD as Consulting Physician (Urology) Danice Goltz, [place] as Physician Assistant (Cardiology) Ronnald Nian, MD as Consulting Physician (Family Medicine) Noel Christmas, MD as Consulting Physician (Urology)  Extended Emergency Contact Information Primary Emergency Contact: Crandell,Dr. Alphia Moh, [place] of [place] Home Phone: [phone] Mobile Phone: [phone] Relation: Son Secondary Emergency Contact: [facility] Address: [address]          [place], [place] [address] D[place] of [place] Home Phone: [phone] Relation: Daughter  Code Status:  DNR Goals of care: Advanced Directive information    12/14/2022    2:31 PM  Advanced Directives  Does Patient Have a Medical Advance Directive? Yes  Type of Estate agent of [place];Living will;Out of facility DNR (pink MOST or yellow form)  Does patient want to make changes to medical advance directive? No - Patient declined  Copy of Healthcare Power of Attorney in Chart? No - copy requested     Chief Complaint  Patient presents with   Medical Management of Chronic Issues    Routine Visit.    Health Maintenance    Discuss the need for AWV.   Immunizations    Discuss the need for Covid Booster, and Influenza vaccine.     HPI:  Pt is a 89 y.o. female seen today for acute visit due to oral candida.   She currently resides on the skilled nursing unit at [facility]. PMH: PAF, h/o TIA, HTN, HLD, atherosclerosis, hiatal hernia, osteopenia, anxiety and  weakness.   H/o tooth pain since 08/12. 08/08 she was started on Augmentin due to left upper maxilla pain. 08/15 she was evaluated by dentist, root canal recommended. Scheduled 09/19. Denies tooth discomfort today. Nursing reports white patches to back of throat. She reports itching/discomfort to back of tongue. Afebrile. Vitals stable.    Past Medical History:  Diagnosis Date   Allergic rhinitis    Arthritis    "hands, little in my feet" (06/22/2016)   Candidiasis of vulva and vagina    Cystocele, midline    Disorder of bone and cartilage, unspecified    Fibroids    Hypertension    Large hiatal hernia 06/23/2016   Migraine    "none in the last few years" (06/22/2016)   Osteopenia    Other chronic cystitis    Other sign and symptom in breast    Paroxysmal atrial fibrillation (HCC) 11/30/2012   Pneumonia    "I've had walking pneumonia a couple times"  (06/22/2016)   Pneumonia dx'd 06/15/2016   Rectal incontinence    Stroke (HCC) 2014   hx of mini stroke    TIA (transient ischemic attack) 2015   Past Surgical History:  Procedure Laterality Date   CARDIOVERSION N/A 06/29/2016   Procedure: CARDIOVERSION;  Surgeon: Jake Bathe, MD;  Location: [facility] OR;  Service: Cardiovascular;  Laterality: N/A;   CATARACT EXTRACTION W/ INTRAOCULAR LENS IMPLANT Left    DILATION AND CURETTAGE OF UTERUS     hiatel hernia  08/20/2016   HYSTEROSCOPY WITH D &  C  03/16/1999   INSERTION OF MESH N/A 08/20/2016   Procedure: INSERTION OF MESH;  Surgeon: Axel Filler, MD;  Location: [facility]S;  Service: General;  Laterality: N/A;   TEE WITHOUT CARDIOVERSION N/A 11/06/2012   Procedure: TRANSESOPHAGEAL ECHOCARDIOGRAM (TEE);  Surgeon: Lewayne Bunting, MD;  Location: [facility] ENDOSCOPY;  Service: Cardiovascular;  Laterality: N/A;   TONSILLECTOMY  1937   VAGINAL HYSTERECTOMY  09/2005   Anterior repair; Removal of urethral caruncle./notes 09/02/2015    Allergies  Allergen Reactions   Augmentin [Amoxicillin-Pot  Clavulanate]    Lactose Other (See Comments)    Dairy Sensitivity  Dairy Sensitivity     Lisinopril Other (See Comments)    DIZZY   Other     SEASONAL ALLERGIES   Bactrim [Sulfamethoxazole-Trimethoprim] Rash    Allergies as of 12/14/2022       Reactions   Augmentin [amoxicillin-pot Clavulanate]    Lactose Other (See Comments)   Dairy Sensitivity  Dairy Sensitivity    Lisinopril Other (See Comments)   DIZZY   Other    SEASONAL ALLERGIES   Bactrim [sulfamethoxazole-trimethoprim] Rash        Medication List        Accurate as of December 14, 2022  2:31 PM. If you have any questions, ask your nurse or doctor.          acetaminophen 500 MG tablet Commonly known as: TYLENOL Take 500 mg by mouth every morning.   acetaminophen 500 MG tablet Commonly known as: TYLENOL Take 500 mg by mouth every 6 (six) hours as needed.   acetaminophen 500 MG tablet Commonly known as: TYLENOL Take 500 mg by mouth at bedtime.   ALPRAZolam 0.25 MG tablet Commonly known as: XANAX Take 1 tablet (0.25 mg total) by mouth at bedtime.   amiodarone 100 MG tablet Commonly known as: PACERONE Take 100 mg by mouth every morning.   CALCIUM CARBONATE-VIT D-MIN PO Take 2 tablets by mouth 3 (three) times a week.   CRANBERRY PO Take 450 mg by mouth daily.   docusate sodium 100 MG capsule Commonly known as: COLACE Take 100 mg by mouth at bedtime.   Eliquis 5 MG Tabs tablet Generic drug: apixaban TAKE 1 TABLET BY MOUTH TWICE DAILY   escitalopram 10 MG tablet Commonly known as: LEXAPRO Take 20 mg by mouth daily.   estradiol 0.1 MG/GM vaginal cream Commonly known as: ESTRACE Place 1 Applicatorful vaginally 3 (three) times a week.   glucosamine-chondroitin 500-400 MG tablet Take 1 tablet by mouth in the morning and at bedtime.   lactase 3000 units tablet Commonly known as: LACTAID Take 9,000 Units by mouth in the morning.   loperamide 2 MG tablet Commonly known as: IMODIUM  A-D Take 2 mg by mouth as needed for diarrhea or loose stools.   midodrine 5 MG tablet Commonly known as: PROAMATINE Take 5 mg by mouth 2 (two) times daily with a meal. Hold if SBP sitting is >150   mirtazapine 30 MG tablet Commonly known as: Remeron Take 1 tablet (30 mg total) by mouth at bedtime.   nitrofurantoin (macrocrystal-monohydrate) 100 MG capsule Commonly known as: MACROBID Take 100 mg by mouth at bedtime.   ondansetron 4 MG tablet Commonly known as: Zofran Take 1 tablet (4 mg total) by mouth every 8 (eight) hours as needed for nausea or vomiting.   polyethylene glycol 17 g packet Commonly known as: MiraLax Take 17 g by mouth 2 (two) times a week.   pravastatin 40 MG  tablet Commonly known as: PRAVACHOL TAKE 1 TABLET(40 MG) BY MOUTH DAILY   PROBIOTIC DIGESTIVE SUPP PO Take 1 capsule by mouth once.   SYSTANE OP Place 1 drop into both eyes 2 (two) times daily as needed (for dry eyes).        Review of Systems  Constitutional:  Negative for fatigue.  HENT:  Negative for mouth sores, sore throat, trouble swallowing and voice change.   Respiratory:  Negative for cough, shortness of breath and wheezing.   Cardiovascular:  Negative for chest pain and leg swelling.  Psychiatric/Behavioral:  Negative for dysphoric mood. The patient is not nervous/anxious.     Immunization History  Administered Date(s) Administered   Fluad Quad(high Dose 65+) 01/09/2019, 01/19/2022   Influenza Split 12/18/2013   Influenza, High Dose Seasonal PF 01/24/2018   Influenza-Unspecified 01/12/2015, 01/13/2016, 01/20/2017, 01/24/2018, 01/06/2021   Moderna Covid-19 Vaccine Bivalent Booster 57yrs & up 01/30/2021   Moderna Sars-Covid-2 Vaccination 05/01/2019, 05/29/2019, 03/04/2020   Pneumococcal Conjugate-13 10/02/2013   Pneumococcal Polysaccharide-23 12/09/2014   Td 05/16/1995, 12/10/2004   Tdap 04/19/2004, 02/20/2016   Zoster Recombinant(Shingrix) 10/29/2016, 03/21/2017   Zoster, Live  02/11/2006   Pertinent  Health Maintenance Due  Topic Date Due   INFLUENZA VACCINE  11/18/2022   DEXA SCAN  Completed   MAMMOGRAM  Discontinued      01/25/2022    7:50 PM 05/11/2022    1:13 PM 06/28/2022    1:49 PM 08/10/2022    2:40 PM 08/16/2022    9:54 AM  Fall Risk  Falls in the past year?  1 1 1 1   Was there an injury with Fall?  0 0 0 0  Fall Risk Category Calculator  1 2 1 1   (RETIRED) Patient Fall Risk Level High fall risk      Patient at Risk for Falls Due to  Impaired mobility History of fall(s) History of fall(s) History of fall(s)  Fall risk Follow up  Falls evaluation completed Falls evaluation completed Falls evaluation completed Falls evaluation completed   Functional Status Survey:    Vitals:   12/14/22 1238  BP: 130/73  Pulse: 92  Resp: 17  Temp: (!) 97.2 F (36.2 C)  SpO2: 93%  Weight: 130 lb 12.8 oz (59.3 kg)  Height: 5\' 6"  (1.676 m)   Body mass index is 21.11 kg/m. Physical Exam Vitals reviewed.  Constitutional:      General: She is not in acute distress. HENT:     Head: Normocephalic.     Nose: Nose normal.     Mouth/Throat:     Pharynx: Oropharyngeal exudate present.     Comments: White patches to base of tongue and posterior pharynx Eyes:     General:        Right eye: No discharge.        Left eye: No discharge.  Cardiovascular:     Rate and Rhythm: Normal rate and regular rhythm.     Pulses: Normal pulses.     Heart sounds: Normal heart sounds.  Pulmonary:     Effort: Pulmonary effort is normal.     Breath sounds: Normal breath sounds.  Neurological:     General: No focal deficit present.     Mental Status: She is alert.  Psychiatric:        Mood and Affect: Mood normal.     Labs reviewed: Recent Labs    01/24/22 1146 01/25/22 0020 01/26/22 0105 02/01/22 0000 05/20/22 1356 05/31/22 0000 08/20/22 0000 09/02/22 0000  11/28/22 0523  NA 141 141 139   < > 136   < > 142 137 141  K 3.8 3.9 3.8   < > 3.9   < > 4.3 4.5 3.7   CL 107 107 106   < > 102   < > 107 100 106  CO2 26 23 24    < > 26   < > 23* 27* 23  GLUCOSE 102* 97 107*  --  116*  --   --   --  103*  BUN 20 21 13    < > 17   < > 19 16 21   CREATININE 0.74 0.81 0.65   < > 0.99   < > 0.8 0.8 1.15*  CALCIUM 9.1 8.9 8.7*   < > 8.9   < > 8.6* 9.1 8.8*  MG 2.0 1.9 2.0  --   --   --   --   --   --    < > = values in this interval not displayed.   Recent Labs    01/19/22 1946 05/20/22 1356 11/28/22 0523  AST 30 24 20   ALT 18 15 14   ALKPHOS 61 43 43  BILITOT 0.6 0.5 0.4  PROT 8.5* 6.7 6.6  ALBUMIN 4.6 3.6 3.4*   Recent Labs    01/19/22 1946 01/21/22 0018 01/22/22 0545 01/23/22 0038 11/28/22 0523  WBC 8.9   < > 8.6 8.7 6.2  NEUTROABS 6.4  --   --   --  4.5  HGB 15.9*   < > 12.7 12.3 12.3  HCT 47.7*   < > 38.5 37.9 40.2  MCV 92.4   < > 91.4 92.0 91.6  PLT 263   < > 229 269 246   < > = values in this interval not displayed.   Lab Results  Component Value Date   TSH 3.663 05/20/2022   Lab Results  Component Value Date   HGBA1C 5.9 (A) 12/04/2019   Lab Results  Component Value Date   CHOL 147 05/31/2022   HDL 62 05/31/2022   LDLCALC 62 05/31/2022   TRIG 115 05/31/2022   CHOLHDL 3.1 04/24/2018    Significant Diagnostic Results in last 30 days:  No results found.  Assessment/Plan 1. Oral candida - white patches to base of tongue and posterior pharynx - nystatin (MYCOSTATIN) 100000 UNITS/ML SUSP; Take 4-6 mLs by mouth every 6 (six) hours for 10 days. Swish and spit.  2. Tooth pain - 08/08 started on Augmentin for left maxillary pain - 08/15 seen by [facility]> root canal recommended> scheduled 09/19 - cont tylenol for pain  Family/ staff Communication: plan discussed with patient and nurse  Labs/tests ordered: none

## 2022-12-29 ENCOUNTER — Non-Acute Institutional Stay (SKILLED_NURSING_FACILITY): Payer: Medicare Other | Admitting: Orthopedic Surgery

## 2022-12-29 ENCOUNTER — Encounter: Payer: Self-pay | Admitting: Orthopedic Surgery

## 2022-12-29 DIAGNOSIS — K623 Rectal prolapse: Secondary | ICD-10-CM

## 2022-12-29 NOTE — Progress Notes (Signed)
Location:  [facility] Room Number: 134/A Place of Service:  SNF [phone]) Provider:  Octavia Heir, NP   Mahlon Gammon, MD  Patient Care Team: Mahlon Gammon, MD as PCP - General (Internal Medicine) [place], [name], MD as PCP - Cardiology (Cardiology) [place], [name], MD as Consulting Physician (Cardiology) Vanessa Barbara, NP as Nurse Practitioner Noel Christmas, MD as Consulting Physician (Urology) Danice Goltz, [place] as Physician Assistant (Cardiology) Ronnald Nian, MD as Consulting Physician (Family Medicine) Noel Christmas, MD as Consulting Physician (Urology)  Extended Emergency Contact Information Primary Emergency Contact: Sorbo,Dr. Alphia Moh, [place] Darden Amber of [place] Home Phone: [phone] Mobile Phone: [phone] Relation: Son Secondary Emergency Contact: [facility] Address: [address]          [place], [place] [address] Darden Amber of [place] Home Phone: [phone] Relation: Daughter  Code Status:  DNR Goals of care: Advanced Directive information    12/14/2022    2:31 PM  Advanced Directives  Does Patient Have a Medical Advance Directive? Yes  Type of Estate agent of [place];Living will;Out of facility DNR (pink MOST or yellow form)  Does patient want to make changes to medical advance directive? No - Patient declined  Copy of Healthcare Power of Attorney in Chart? No - copy requested     Chief Complaint  Patient presents with   Acute Visit    Rectal prolapse    HPI:  Pt is a 89 y.o. female seen today for acute visit due to suspected rectal prolapse.   She currently resides on the skilled nursing unit at [facility]. PMH: PAF, h/o TIA, HTN, HLD, atherosclerosis, hiatal hernia, osteopenia, anxiety and weakness.   H/o uterine prolapse. 09/10 she was noted to have small liquid bowel movement. Family was concerned she may have rectal prolapse. Daughter, who  is RN, saw her this morning and helped manipulate prolapse back into place. She denies pain during our encounter. No sign of prolapse during my exam. Miralax was discontinued per family wishes. She had been having "normal" bowel movements up until 1 day ago. Afebrile. Vitals stable.    Past Medical History:  Diagnosis Date   Allergic rhinitis    Arthritis    "hands, little in my feet" (06/22/2016)   Candidiasis of vulva and vagina    Cystocele, midline    Disorder of bone and cartilage, unspecified    Fibroids    Hypertension    Large hiatal hernia 06/23/2016   Migraine    "none in the last few years" (06/22/2016)   Osteopenia    Other chronic cystitis    Other sign and symptom in breast    Paroxysmal atrial fibrillation (HCC) 11/30/2012   Pneumonia    "I've had walking pneumonia a couple times"  (06/22/2016)   Pneumonia dx'd 06/15/2016   Rectal incontinence    Stroke (HCC) 2014   hx of mini stroke    TIA (transient ischemic attack) 2015   Past Surgical History:  Procedure Laterality Date   CARDIOVERSION N/A 06/29/2016   Procedure: CARDIOVERSION;  Surgeon: Jake Bathe, MD;  Location: [facility] OR;  Service: Cardiovascular;  Laterality: N/A;   CATARACT EXTRACTION W/ INTRAOCULAR LENS IMPLANT Left    DILATION AND CURETTAGE OF UTERUS     hiatel hernia  08/20/2016   HYSTEROSCOPY WITH D & C  03/16/1999   INSERTION OF MESH N/A 08/20/2016  Procedure: INSERTION OF MESH;  Surgeon: Axel Filler, MD;  Location: [facility] ORS;  Service: General;  Laterality: N/A;   TEE WITHOUT CARDIOVERSION N/A 11/06/2012   Procedure: TRANSESOPHAGEAL ECHOCARDIOGRAM (TEE);  Surgeon: Lewayne Bunting, MD;  Location: [facility] ENDOSCOPY;  Service: Cardiovascular;  Laterality: N/A;   TONSILLECTOMY  1937   VAGINAL HYSTERECTOMY  09/2005   Anterior repair; Removal of urethral caruncle./notes 09/02/2015    Allergies  Allergen Reactions   Augmentin [Amoxicillin-Pot Clavulanate]    Lactose Other (See Comments)    Dairy  Sensitivity  Dairy Sensitivity     Lisinopril Other (See Comments)    DIZZY   Other     SEASONAL ALLERGIES   Bactrim [Sulfamethoxazole-Trimethoprim] Rash    Outpatient Encounter Medications as of 12/29/2022  Medication Sig   acetaminophen (TYLENOL) 500 MG tablet Take 500 mg by mouth every morning.   acetaminophen (TYLENOL) 500 MG tablet Take 500 mg by mouth every 6 (six) hours as needed.   acetaminophen (TYLENOL) 500 MG tablet Take 500 mg by mouth at bedtime.   ALPRAZolam (XANAX) 0.25 MG tablet Take 1 tablet (0.25 mg total) by mouth at bedtime.   amiodarone (PACERONE) 100 MG tablet Take 100 mg by mouth every morning.   CALCIUM CARBONATE-VIT D-MIN PO Take 2 tablets by mouth 3 (three) times a week.    CRANBERRY PO Take 450 mg by mouth daily.   docusate sodium (COLACE) 100 MG capsule Take 100 mg by mouth at bedtime.   ELIQUIS 5 MG TABS tablet TAKE 1 TABLET BY MOUTH TWICE DAILY   escitalopram (LEXAPRO) 10 MG tablet Take 20 mg by mouth daily.   estradiol (ESTRACE) 0.1 MG/GM vaginal cream Place 1 Applicatorful vaginally 3 (three) times a week.   glucosamine-chondroitin 500-400 MG tablet Take 1 tablet by mouth in the morning and at bedtime.   lactase (LACTAID) 3000 units tablet Take 9,000 Units by mouth in the morning.   loperamide (IMODIUM A-D) 2 MG tablet Take 2 mg by mouth as needed for diarrhea or loose stools.   midodrine (PROAMATINE) 5 MG tablet Take 5 mg by mouth 2 (two) times daily with a meal. Hold if SBP sitting is >150   mirtazapine (REMERON) 30 MG tablet Take 1 tablet (30 mg total) by mouth at bedtime.   nitrofurantoin, macrocrystal-monohydrate, (MACROBID) 100 MG capsule Take 100 mg by mouth at bedtime.   ondansetron (ZOFRAN) 4 MG tablet Take 1 tablet (4 mg total) by mouth every 8 (eight) hours as needed for nausea or vomiting.   Polyethyl Glycol-Propyl Glycol (SYSTANE OP) Place 1 drop into both eyes 2 (two) times daily as needed (for dry eyes).    polyethylene glycol (MIRALAX) 17  g packet Take 17 g by mouth 2 (two) times a week.   pravastatin (PRAVACHOL) 40 MG tablet TAKE 1 TABLET(40 MG) BY MOUTH DAILY   Probiotic Product (PROBIOTIC DIGESTIVE SUPP PO) Take 1 capsule by mouth once.   No facility-administered encounter medications on file as of 12/29/2022.    Review of Systems  Constitutional:  Negative for activity change and appetite change.  Respiratory:  Negative for cough.   Cardiovascular:  Negative for chest pain.  Gastrointestinal:  Positive for constipation and diarrhea. Negative for abdominal distention, abdominal pain, blood in stool, nausea, rectal pain and vomiting.  Genitourinary:  Negative for vaginal bleeding and vaginal pain.  Psychiatric/Behavioral:  Negative for dysphoric mood. The patient is not nervous/anxious.     Immunization History  Administered Date(s) Administered   Fluad Quad(high  Dose 65+) 01/09/2019, 01/19/2022   Influenza Split 12/18/2013   Influenza, High Dose Seasonal PF 01/24/2018   Influenza-Unspecified 01/12/2015, 01/13/2016, 01/20/2017, 01/24/2018, 01/06/2021   Moderna Covid-19 Vaccine Bivalent Booster 10yrs & up 01/30/2021   Moderna Sars-Covid-2 Vaccination 05/01/2019, 05/29/2019, 03/04/2020   Pneumococcal Conjugate-13 10/02/2013   Pneumococcal Polysaccharide-23 12/09/2014   Td 05/16/1995, 12/10/2004   Tdap 04/19/2004, 02/20/2016   Zoster Recombinant(Shingrix) 10/29/2016, 03/21/2017   Zoster, Live 02/11/2006   Pertinent  Health Maintenance Due  Topic Date Due   INFLUENZA VACCINE  11/18/2022   DEXA SCAN  Completed   MAMMOGRAM  Discontinued      01/25/2022    7:50 PM 05/11/2022    1:13 PM 06/28/2022    1:49 PM 08/10/2022    2:40 PM 08/16/2022    9:54 AM  Fall Risk  Falls in the past year?  1 1 1 1   Was there an injury with Fall?  0 0 0 0  Fall Risk Category Calculator  1 2 1 1   (RETIRED) Patient Fall Risk Level High fall risk      Patient at Risk for Falls Due to  Impaired mobility History of fall(s) History of  fall(s) History of fall(s)  Fall risk Follow up  Falls evaluation completed Falls evaluation completed Falls evaluation completed Falls evaluation completed   Functional Status Survey:    Vitals:   12/29/22 1532  BP: (!) 162/78  Pulse: 71  Resp: (!) 22  Temp: 98.3 F (36.8 C)  SpO2: 96%  Weight: 127 lb (57.6 kg)  Height: 5\' 6"  (1.676 m)   Body mass index is 20.5 kg/m. Physical Exam Vitals reviewed.  Constitutional:      General: She is not in acute distress. HENT:     Head: Normocephalic.  Eyes:     General:        Right eye: No discharge.        Left eye: No discharge.  Cardiovascular:     Rate and Rhythm: Normal rate and regular rhythm.     Pulses: Normal pulses.     Heart sounds: Normal heart sounds.  Pulmonary:     Effort: Pulmonary effort is normal. No respiratory distress.     Breath sounds: Normal breath sounds. No wheezing or rales.  Abdominal:     General: Bowel sounds are normal. There is no distension.     Palpations: Abdomen is soft. There is no mass.     Tenderness: There is no abdominal tenderness.     Hernia: No hernia is present.  Genitourinary:    Urethra: No prolapse.     Rectum: External hemorrhoid and internal hemorrhoid present. No mass or tenderness. Normal anal tone.  Skin:    General: Skin is warm.  Neurological:     General: No focal deficit present.     Mental Status: She is alert.  Psychiatric:        Mood and Affect: Mood normal.     Labs reviewed: Recent Labs    01/24/22 1146 01/25/22 0020 01/26/22 0105 02/01/22 0000 05/20/22 1356 05/31/22 0000 08/20/22 0000 09/02/22 0000 11/28/22 0523  NA 141 141 139   < > 136   < > 142 137 141  K 3.8 3.9 3.8   < > 3.9   < > 4.3 4.5 3.7  CL 107 107 106   < > 102   < > 107 100 106  CO2 26 23 24    < > 26   < > 23*  27* 23  GLUCOSE 102* 97 107*  --  116*  --   --   --  103*  BUN 20 21 13    < > 17   < > 19 16 21   CREATININE 0.74 0.81 0.65   < > 0.99   < > 0.8 0.8 1.15*  CALCIUM 9.1 8.9  8.7*   < > 8.9   < > 8.6* 9.1 8.8*  MG 2.0 1.9 2.0  --   --   --   --   --   --    < > = values in this interval not displayed.   Recent Labs    01/19/22 1946 05/20/22 1356 11/28/22 0523  AST 30 24 20   ALT 18 15 14   ALKPHOS 61 43 43  BILITOT 0.6 0.5 0.4  PROT 8.5* 6.7 6.6  ALBUMIN 4.6 3.6 3.4*   Recent Labs    01/19/22 1946 01/21/22 0018 01/22/22 0545 01/23/22 0038 11/28/22 0523  WBC 8.9   < > 8.6 8.7 6.2  NEUTROABS 6.4  --   --   --  4.5  HGB 15.9*   < > 12.7 12.3 12.3  HCT 47.7*   < > 38.5 37.9 40.2  MCV 92.4   < > 91.4 92.0 91.6  PLT 263   < > 229 269 246   < > = values in this interval not displayed.   Lab Results  Component Value Date   TSH 3.663 05/20/2022   Lab Results  Component Value Date   HGBA1C 5.9 (A) 12/04/2019   Lab Results  Component Value Date   CHOL 147 05/31/2022   HDL 62 05/31/2022   LDLCALC 62 05/31/2022   TRIG 115 05/31/2022   CHOLHDL 3.1 04/24/2018    Significant Diagnostic Results in last 30 days:  No results found.  Assessment/Plan 1. Incomplete rectal prolapse - h/o uterine prolapse - daughter helped manipulate back in place this morning - exam unremarkable except few small hemorrhoids - asymptomatic at this time - miralax was discontinued - having normal bowel movements per nurses notes - cont colace    Family/ staff Communication: plan discussed with patient and nurse  Labs/tests ordered:  none

## 2023-02-10 ENCOUNTER — Non-Acute Institutional Stay (SKILLED_NURSING_FACILITY): Payer: Medicare Other | Admitting: Adult Health

## 2023-02-10 DIAGNOSIS — Z87448 Personal history of other diseases of urinary system: Secondary | ICD-10-CM | POA: Diagnosis not present

## 2023-02-10 DIAGNOSIS — K580 Irritable bowel syndrome with diarrhea: Secondary | ICD-10-CM | POA: Diagnosis not present

## 2023-02-10 DIAGNOSIS — G44229 Chronic tension-type headache, not intractable: Secondary | ICD-10-CM

## 2023-02-10 DIAGNOSIS — I480 Paroxysmal atrial fibrillation: Secondary | ICD-10-CM

## 2023-02-10 DIAGNOSIS — I951 Orthostatic hypotension: Secondary | ICD-10-CM | POA: Diagnosis not present

## 2023-02-10 DIAGNOSIS — K623 Rectal prolapse: Secondary | ICD-10-CM

## 2023-02-10 DIAGNOSIS — F329 Major depressive disorder, single episode, unspecified: Secondary | ICD-10-CM

## 2023-02-10 NOTE — Progress Notes (Signed)
Location:  [facility] illustrator of Service:  SNF (31) Provider:  Fletcher Anon, NP    Patient Care Team: Mahlon Gammon, MD as PCP - General (Internal Medicine) [place], [name], MD as PCP - Cardiology (Cardiology) [place], [name], MD as Consulting Physician (Cardiology) Vanessa Barbara, NP as Nurse Practitioner Noel Christmas, MD as Consulting Physician (Urology) Danice Goltz, [place] as Physician Assistant (Cardiology) Ronnald Nian, MD as Consulting Physician (Family Medicine) Noel Christmas, MD as Consulting Physician (Urology)  Extended Emergency Contact Information Primary Emergency Contact: Cazier,Dr. Alphia Moh, [place] Darden Amber of [place] Home Phone: [phone] Mobile Phone: [phone] Relation: Son Secondary Emergency Contact: [facility] Address: [address]          [place], [place] [address] Darden Amber of [place] Home Phone: [phone] Relation: Daughter  Code Status:  DNR Goals of care: Advanced Directive information    12/14/2022    2:31 PM  Advanced Directives  Does Patient Have a Medical Advance Directive? Yes  Type of Estate agent of [place];Living will;Out of facility DNR (pink MOST or yellow form)  Does patient want to make changes to medical advance directive? No - Patient declined  Copy of Healthcare Power of Attorney in Chart? No - copy requested     Chief Complaint  Patient presents with   Medical Management of Chronic Issues    HPI:  Pt is a 89 y.o. female seen today for medical management of chronic diseases.    PMH significant for MCI, HTN, Afib, HLD, TIA, osteoporosis, nissen fundoplication, chronic leg edema, recurrent UTI, anxiety  Has rectal prolapse. No rectal pain. Has had some loose stools possibly associated with diary. Now on lactaid. Nursing reports her stools are more formed now. She is not having fever or abd pain. She did go through a period of  weight loss but has now gained weight back which was desirable.   Wt Readings from Last 3 Encounters:  02/10/23 133 lb 6.4 oz (60.5 kg)  12/29/22 127 lb (57.6 kg)  12/14/22 130 lb 12.8 oz (59.3 kg)    Followed by [facility] for depression and anxiety. The nurse reports her mood has improved after increase in Remeron and Lexapro. Pt denies any feelings of depression at this time.   Placed on scheduled tylenol for chronic headaches. Noted improvement.    MMSE 27/17 August 2022 Needs help with bathing and dressing. Able to walk with a walker. Memory worsening over time.   Takes midodrine for orthostatic hypotension.   Has hx of recurrent UTI, no dysuria or frequency at this time.   Had dental extraction 9/19 hx of prior infection. No new issues.   11/2022 EKG showed SR 1st degree AV block left anterior hemi block QTc .486 Mg 2.1  Past Medical History:  Diagnosis Date   Allergic rhinitis    Arthritis    "hands, little in my feet" (06/22/2016)   Candidiasis of vulva and vagina    Cystocele, midline    Disorder of bone and cartilage, unspecified    Fibroids    Hypertension    Large hiatal hernia 06/23/2016   Migraine    "none in the last few years" (06/22/2016)   Osteopenia    Other chronic cystitis    Other sign and symptom in breast    Paroxysmal atrial fibrillation (HCC) 11/30/2012   Pneumonia    "I've had walking pneumonia  a couple times"  (06/22/2016)   Pneumonia dx'd 06/15/2016   Rectal incontinence    Stroke (HCC) 2014   hx of mini stroke    TIA (transient ischemic attack) 2015   Past Surgical History:  Procedure Laterality Date   CARDIOVERSION N/A 06/29/2016   Procedure: CARDIOVERSION;  Surgeon: Jake Bathe, MD;  Location: [facility] OR;  Service: Cardiovascular;  Laterality: N/A;   CATARACT EXTRACTION W/ INTRAOCULAR LENS IMPLANT Left    DILATION AND CURETTAGE OF UTERUS     hiatel hernia  08/20/2016   HYSTEROSCOPY WITH D & C  03/16/1999   INSERTION OF MESH N/A 08/20/2016    Procedure: INSERTION OF MESH;  Surgeon: Axel Filler, MD;  Location: [facility] ORS;  Service: General;  Laterality: N/A;   TEE WITHOUT CARDIOVERSION N/A 11/06/2012   Procedure: TRANSESOPHAGEAL ECHOCARDIOGRAM (TEE);  Surgeon: Lewayne Bunting, MD;  Location: [facility] ENDOSCOPY;  Service: Cardiovascular;  Laterality: N/A;   TONSILLECTOMY  1937   VAGINAL HYSTERECTOMY  09/2005   Anterior repair; Removal of urethral caruncle./notes 09/02/2015    Allergies  Allergen Reactions   Augmentin [Amoxicillin-Pot Clavulanate]    Lactose Other (See Comments)    Dairy Sensitivity  Dairy Sensitivity     Lisinopril Other (See Comments)    DIZZY   Other     SEASONAL ALLERGIES   Bactrim [Sulfamethoxazole-Trimethoprim] Rash    Outpatient Encounter Medications as of 02/10/2023  Medication Sig   acetaminophen (TYLENOL) 500 MG tablet Take 500 mg by mouth every morning.   acetaminophen (TYLENOL) 500 MG tablet Take 500 mg by mouth every 6 (six) hours as needed.   acetaminophen (TYLENOL) 500 MG tablet Take 500 mg by mouth at bedtime.   ALPRAZolam (XANAX) 0.25 MG tablet Take 1 tablet (0.25 mg total) by mouth at bedtime.   amiodarone (PACERONE) 100 MG tablet Take 100 mg by mouth every morning.   CALCIUM CARBONATE-VIT D-MIN PO Take 2 tablets by mouth 3 (three) times a week.    CRANBERRY PO Take 450 mg by mouth daily.   docusate sodium (COLACE) 100 MG capsule Take 100 mg by mouth at bedtime.   ELIQUIS 5 MG TABS tablet TAKE 1 TABLET BY MOUTH TWICE DAILY   escitalopram (LEXAPRO) 10 MG tablet Take 20 mg by mouth daily.   estradiol (ESTRACE) 0.1 MG/GM vaginal cream Place 1 Applicatorful vaginally 3 (three) times a week.   glucosamine-chondroitin 500-400 MG tablet Take 1 tablet by mouth in the morning and at bedtime.   lactase (LACTAID) 3000 units tablet Take 9,000 Units by mouth in the morning.   loperamide (IMODIUM A-D) 2 MG tablet Take 2 mg by mouth as needed for diarrhea or loose stools.   midodrine (PROAMATINE) 5 MG  tablet Take 5 mg by mouth 2 (two) times daily with a meal. Hold if SBP sitting is >150   mirtazapine (REMERON) 30 MG tablet Take 1 tablet (30 mg total) by mouth at bedtime.   nitrofurantoin, macrocrystal-monohydrate, (MACROBID) 100 MG capsule Take 100 mg by mouth at bedtime.   ondansetron (ZOFRAN) 4 MG tablet Take 1 tablet (4 mg total) by mouth every 8 (eight) hours as needed for nausea or vomiting.   Polyethyl Glycol-Propyl Glycol (SYSTANE OP) Place 1 drop into both eyes 2 (two) times daily as needed (for dry eyes).    pravastatin (PRAVACHOL) 40 MG tablet TAKE 1 TABLET(40 MG) BY MOUTH DAILY   Probiotic Product (PROBIOTIC DIGESTIVE SUPP PO) Take 1 capsule by mouth once.   No facility-administered encounter medications  on file as of 02/10/2023.    Review of Systems  Constitutional:  Negative for activity change, appetite change, chills, diaphoresis, fatigue, fever and unexpected weight change.  HENT:  Negative for congestion.   Respiratory:  Negative for cough, shortness of breath and wheezing.   Cardiovascular:  Positive for leg swelling. Negative for chest pain and palpitations.  Gastrointestinal:  Positive for constipation and rectal pain. Negative for abdominal distention, abdominal pain and diarrhea.  Genitourinary:  Negative for difficulty urinating and dysuria.  Musculoskeletal:  Positive for gait problem. Negative for arthralgias, back pain, joint swelling and myalgias.  Neurological:  Negative for dizziness, tremors, seizures, syncope, facial asymmetry, speech difficulty, weakness, light-headedness, numbness and headaches.  Psychiatric/Behavioral:  Positive for confusion. Negative for agitation and behavioral problems.     Immunization History  Administered Date(s) Administered   Fluad Quad(high Dose 65+) 01/09/2019, 01/19/2022   Influenza Split 12/18/2013   Influenza, High Dose Seasonal PF 01/24/2018   Influenza-Unspecified 01/12/2015, 01/13/2016, 01/20/2017, 01/24/2018,  01/06/2021   Moderna Covid-19 Vaccine Bivalent Booster 79yrs & up 01/30/2021   Moderna Sars-Covid-2 Vaccination 05/01/2019, 05/29/2019, 03/04/2020   Pneumococcal Conjugate-13 10/02/2013   Pneumococcal Polysaccharide-23 12/09/2014   Td 05/16/1995, 12/10/2004   Tdap 04/19/2004, 02/20/2016   Zoster Recombinant(Shingrix) 10/29/2016, 03/21/2017   Zoster, Live 02/11/2006   Pertinent  Health Maintenance Due  Topic Date Due   INFLUENZA VACCINE  11/18/2022   DEXA SCAN  Completed   MAMMOGRAM  Discontinued      01/25/2022    7:50 PM 05/11/2022    1:13 PM 06/28/2022    1:49 PM 08/10/2022    2:40 PM 08/16/2022    9:54 AM  Fall Risk  Falls in the past year?  1 1 1 1   Was there an injury with Fall?  0 0 0 0  Fall Risk Category Calculator  1 2 1 1   (RETIRED) Patient Fall Risk Level High fall risk      Patient at Risk for Falls Due to  Impaired mobility History of fall(s) History of fall(s) History of fall(s)  Fall risk Follow up  Falls evaluation completed Falls evaluation completed Falls evaluation completed Falls evaluation completed   Functional Status Survey:    Vitals:   02/10/23 1655  BP: (!) 151/70  Pulse: 61  Resp: 18  Temp: (!) 97.3 F (36.3 C)  SpO2: 97%  Weight: 133 lb 6.4 oz (60.5 kg)   Body mass index is 21.53 kg/m. Physical Exam Vitals and nursing note reviewed.  Constitutional:      General: She is not in acute distress.    Appearance: She is not diaphoretic.  HENT:     Head: Normocephalic and atraumatic.     Mouth/Throat:     Mouth: Mucous membranes are moist.     Pharynx: Oropharynx is clear. No oropharyngeal exudate.  Neck:     Vascular: No JVD.  Cardiovascular:     Rate and Rhythm: Normal rate and regular rhythm.     Heart sounds: No murmur heard. Pulmonary:     Effort: Pulmonary effort is normal. No respiratory distress.     Breath sounds: Normal breath sounds. No wheezing.  Abdominal:     General: Bowel sounds are normal. There is no distension.      Palpations: Abdomen is soft.     Tenderness: There is no abdominal tenderness.  Musculoskeletal:     Comments: BLE edema +1  Skin:    General: Skin is warm and dry.  Neurological:  General: No focal deficit present.     Mental Status: She is alert. Mental status is at baseline.  Psychiatric:        Mood and Affect: Mood normal.    Labs reviewed: Recent Labs    05/20/22 1356 05/31/22 0000 08/20/22 0000 09/02/22 0000 11/28/22 0523  NA 136   < > 142 137 141  K 3.9   < > 4.3 4.5 3.7  CL 102   < > 107 100 106  CO2 26   < > 23* 27* 23  GLUCOSE 116*  --   --   --  103*  BUN 17   < > 19 16 21   CREATININE 0.99   < > 0.8 0.8 1.15*  CALCIUM 8.9   < > 8.6* 9.1 8.8*   < > = values in this interval not displayed.   Recent Labs    05/20/22 1356 11/28/22 0523  AST 24 20  ALT 15 14  ALKPHOS 43 43  BILITOT 0.5 0.4  PROT 6.7 6.6  ALBUMIN 3.6 3.4*   Recent Labs    11/28/22 0523  WBC 6.2  NEUTROABS 4.5  HGB 12.3  HCT 40.2  MCV 91.6  PLT 246   Lab Results  Component Value Date   TSH 3.663 05/20/2022   Lab Results  Component Value Date   HGBA1C 5.9 (A) 12/04/2019   Lab Results  Component Value Date   CHOL 147 05/31/2022   HDL 62 05/31/2022   LDLCALC 62 05/31/2022   TRIG 115 05/31/2022   CHOLHDL 3.1 04/24/2018    Significant Diagnostic Results in last 30 days:  No results found.  Assessment/Plan  1. Irritable bowel syndrome with diarrhea Improved with lactaid.   2. H/O chronic cystitis No symptoms right now Currently on macrodantin and estrace  3. Chronic tension-type headache, not intractable Improved with scheduled tylenol   4. Orthostatic hypotension Controlled with midodrine Held at times due to high bp  5. Incomplete rectal prolapse noted  6. Reactive depression Improved with Remeron and Lexapro   7. Paroxysmal atrial fibrillation (HCC) Regular on exam Rate is controlled with amiodarone. Needs LFTs and TSH q 6 months  On eliquis for CVA  risk reduction   Family/ staff Communication: nurse  Labs/tests ordered:  TSH

## 2023-02-11 ENCOUNTER — Encounter: Payer: Self-pay | Admitting: Adult Health

## 2023-02-11 DIAGNOSIS — K580 Irritable bowel syndrome with diarrhea: Secondary | ICD-10-CM | POA: Insufficient documentation

## 2023-02-17 ENCOUNTER — Encounter: Payer: Self-pay | Admitting: Adult Health

## 2023-02-17 ENCOUNTER — Non-Acute Institutional Stay (INDEPENDENT_AMBULATORY_CARE_PROVIDER_SITE_OTHER): Payer: Medicare Other | Admitting: Adult Health

## 2023-02-17 DIAGNOSIS — Z0000 Encounter for general adult medical examination without abnormal findings: Secondary | ICD-10-CM | POA: Diagnosis not present

## 2023-02-17 NOTE — Patient Instructions (Signed)
[name] , Thank you for taking time to come for your Medicare Wellness Visit. I appreciate your ongoing commitment to your health goals. Please review the following plan we discussed and let me know if I can assist you in the future.   Screening recommendations/referrals: Colonoscopy aged out Mammogram aged out Bone Density due to age and goals of care would not pursue.  Recommended yearly ophthalmology/optometry visit for glaucoma screening and checkup Recommended yearly dental visit for hygiene and checkup  Vaccinations: Influenza vaccine- due annually in September/October Pneumococcal vaccine up to date  Tdap vaccine up to date  Shingles vaccine up to date     Advanced directives: reviewed   Conditions/risks identified: Fall risk   Next appointment: 1 year   Preventive Care 87 Years and Older, Female Preventive care refers to lifestyle choices and visits with your health care provider that can promote health and wellness. What does preventive care include? A yearly physical exam. This is also called an annual well check. Dental exams once or twice a year. Routine eye exams. Ask your health care provider how often you should have your eyes checked. Personal lifestyle choices, including: Daily care of your teeth and gums. Regular physical activity. Eating a healthy diet. Avoiding tobacco and drug use. Limiting alcohol use. Practicing safe sex. Taking low-dose aspirin every day. Taking vitamin and mineral supplements as recommended by your health care provider. What happens during an annual well check? The services and screenings done by your health care provider during your annual well check will depend on your age, overall health, lifestyle risk factors, and family history of disease. Counseling  Your health care provider may ask you questions about your: Alcohol use. Tobacco use. Drug use. Emotional well-being. Home and relationship well-being. Sexual  activity. Eating habits. History of falls. Memory and ability to understand (cognition). Work and work Astronomer. Reproductive health. Screening  You may have the following tests or measurements: Height, weight, and BMI. Blood pressure. Lipid and cholesterol levels. These may be checked every 5 years, or more frequently if you are over 16 years old. Skin check. Lung cancer screening. You may have this screening every year starting at age 27 if you have a 30-pack-year history of smoking and currently smoke or have quit within the past 15 years. Fecal occult blood test (FOBT) of the stool. You may have this test every year starting at age 58. Flexible sigmoidoscopy or colonoscopy. You may have a sigmoidoscopy every 5 years or a colonoscopy every 10 years starting at age 69. Hepatitis C blood test. Hepatitis B blood test. Sexually transmitted disease (STD) testing. Diabetes screening. This is done by checking your blood sugar (glucose) after you have not eaten for a while (fasting). You may have this done every 1-3 years. Bone density scan. This is done to screen for osteoporosis. You may have this done starting at age 26. Mammogram. This may be done every 1-2 years. Talk to your health care provider about how often you should have regular mammograms. Talk with your health care provider about your test results, treatment options, and if necessary, the need for more tests. Vaccines  Your health care provider may recommend certain vaccines, such as: Influenza vaccine. This is recommended every year. Tetanus, diphtheria, and acellular pertussis (Tdap, Td) vaccine. You may need a Td booster every 10 years. Zoster vaccine. You may need this after age 25. Pneumococcal 13-valent conjugate (PCV13) vaccine. One dose is recommended after age 24. Pneumococcal polysaccharide (PPSV23) vaccine. One dose  is recommended after age 76. Talk to your health care provider about which screenings and vaccines  you need and how often you need them. This information is not intended to replace advice given to you by your health care provider. Make sure you discuss any questions you have with your health care provider. Document Released: 05/02/2015 Document Revised: 12/24/2015 Document Reviewed: 02/04/2015 Elsevier Interactive Patient Education  2017 [employer].  Fall Prevention in the Home Falls can cause injuries. They can happen to people of all ages. There are many things you can do to make your home safe and to help prevent falls. What can I do on the outside of my home? Regularly fix the edges of walkways and driveways and fix any cracks. Remove anything that might make you trip as you walk through a door, such as a raised step or threshold. Trim any bushes or trees on the path to your home. Use bright outdoor lighting. Clear any walking paths of anything that might make someone trip, such as rocks or tools. Regularly check to see if handrails are loose or broken. Make sure that both sides of any steps have handrails. Any raised decks and porches should have guardrails on the edges. Have any leaves, snow, or ice cleared regularly. Use sand or salt on walking paths during winter. Clean up any spills in your garage right away. This includes oil or grease spills. What can I do in the bathroom? Use night lights. Install grab bars by the toilet and in the tub and shower. Do not use towel bars as grab bars. Use non-skid mats or decals in the tub or shower. If you need to sit down in the shower, use a plastic, non-slip stool. Keep the floor dry. Clean up any water that spills on the floor as soon as it happens. Remove soap buildup in the tub or shower regularly. Attach bath mats securely with double-sided non-slip rug tape. Do not have throw rugs and other things on the floor that can make you trip. What can I do in the bedroom? Use night lights. Make sure that you have a light by your bed that  is easy to reach. Do not use any sheets or blankets that are too big for your bed. They should not hang down onto the floor. Have a firm chair that has side arms. You can use this for support while you get dressed. Do not have throw rugs and other things on the floor that can make you trip. What can I do in the kitchen? Clean up any spills right away. Avoid walking on wet floors. Keep items that you use a lot in easy-to-reach places. If you need to reach something above you, use a strong step stool that has a grab bar. Keep electrical cords out of the way. Do not use floor polish or wax that makes floors slippery. If you must use wax, use non-skid floor wax. Do not have throw rugs and other things on the floor that can make you trip. What can I do with my stairs? Do not leave any items on the stairs. Make sure that there are handrails on both sides of the stairs and use them. Fix handrails that are broken or loose. Make sure that handrails are as long as the stairways. Check any carpeting to make sure that it is firmly attached to the stairs. Fix any carpet that is loose or worn. Avoid having throw rugs at the top or bottom of the stairs.  If you do have throw rugs, attach them to the floor with carpet tape. Make sure that you have a light switch at the top of the stairs and the bottom of the stairs. If you do not have them, ask someone to add them for you. What else can I do to help prevent falls? Wear shoes that: Do not have high heels. Have rubber bottoms. Are comfortable and fit you well. Are closed at the toe. Do not wear sandals. If you use a stepladder: Make sure that it is fully opened. Do not climb a closed stepladder. Make sure that both sides of the stepladder are locked into place. Ask someone to hold it for you, if possible. Clearly mark and make sure that you can see: Any grab bars or handrails. First and last steps. Where the edge of each step is. Use tools that help you  move around (mobility aids) if they are needed. These include: Canes. Walkers. Scooters. Crutches. Turn on the lights when you go into a dark area. Replace any light bulbs as soon as they burn out. Set up your furniture so you have a clear path. Avoid moving your furniture around. If any of your floors are uneven, fix them. If there are any pets around you, be aware of where they are. Review your medicines with your doctor. Some medicines can make you feel dizzy. This can increase your chance of falling. Ask your doctor what other things that you can do to help prevent falls. This information is not intended to replace advice given to you by your health care provider. Make sure you discuss any questions you have with your health care provider. Document Released: 01/30/2009 Document Revised: 09/11/2015 Document Reviewed: 05/10/2014 Elsevier Interactive Patient Education  2017 [employer].

## 2023-02-17 NOTE — Progress Notes (Signed)
Subjective:   [name] is a 89 y.o. female who presents for Medicare Annual (Subsequent) preventive examination at [facility].  Visit Complete: In person  Patient Medicare AWV questionnaire was completed by the patient on 02/17/23; I have confirmed that all information answered by patient is correct and no changes since this date.  Cardiac Risk Factors include: advanced age (>18men, >4 women);hypertension     Objective:    Today's Vitals   02/17/23 1535  Weight: 133 lb 6.4 oz (60.5 kg)   Body mass index is 21.53 kg/m.     02/17/2023    3:58 PM 12/14/2022    2:31 PM 11/15/2022   11:33 AM 10/14/2022    9:41 AM 09/27/2022    4:11 PM 08/31/2022    2:15 PM 08/26/2022    3:55 PM  Advanced Directives  Does Patient Have a Medical Advance Directive? Yes Yes Yes Yes Yes Yes Yes  Type of Estate agent of [place];Living will;Out of facility DNR (pink MOST or yellow form) Healthcare Power of [place];Living will;Out of facility DNR (pink MOST or yellow form) Healthcare Power of [place];Living will;Out of facility DNR (pink MOST or yellow form) Healthcare Power of [place];Living will;Out of facility DNR (pink MOST or yellow form) Healthcare Power of [place];Living will;Out of facility DNR (pink MOST or yellow form) Healthcare Power of [place];Living will;Out of facility DNR (pink MOST or yellow form) Healthcare Power of [place];Living will;Out of facility DNR (pink MOST or yellow form)  Does patient want to make changes to medical advance directive?  No - Patient declined No - Patient declined No - Patient declined No - Patient declined No - Patient declined No - Patient declined  Copy of Healthcare Power of Attorney in Chart? Yes - validated most recent copy scanned in chart (See row information) No - copy requested No - copy requested No - copy requested No - copy requested No - copy requested No - copy requested  Pre-existing out  of facility DNR order (yellow form or pink MOST form) Pink MOST/Yellow Form most recent copy in chart - Physician notified to receive inpatient order          Current Medications (verified) Outpatient Encounter Medications as of 02/17/2023  Medication Sig   acetaminophen (TYLENOL) 500 MG tablet Take 500 mg by mouth every 6 (six) hours as needed.   acetaminophen (TYLENOL) 500 MG tablet Take 500 mg by mouth in the morning, at noon, and at bedtime.   ALPRAZolam (XANAX) 0.25 MG tablet Take 1 tablet (0.25 mg total) by mouth at bedtime.   amiodarone (PACERONE) 100 MG tablet Take 100 mg by mouth every morning.   CALCIUM CARBONATE-VIT D-MIN PO Take 2 tablets by mouth 3 (three) times a week.    CRANBERRY PO Take 450 mg by mouth daily.   docusate sodium (COLACE) 100 MG capsule Take 100 mg by mouth at bedtime.   ELIQUIS 5 MG TABS tablet TAKE 1 TABLET BY MOUTH TWICE DAILY   escitalopram (LEXAPRO) 10 MG tablet Take 20 mg by mouth daily.   estradiol (ESTRACE) 0.1 MG/GM vaginal cream Place 1 Applicatorful vaginally 3 (three) times a week.   glucosamine-chondroitin 500-400 MG tablet Take 1 tablet by mouth in the morning and at bedtime.   lactase (LACTAID) 3000 units tablet Take 9,000 Units by mouth 3 (three) times daily with meals.   loperamide (IMODIUM A-D) 2 MG tablet Take 2 mg by mouth as needed for diarrhea or loose stools.  midodrine (PROAMATINE) 5 MG tablet Take 5 mg by mouth 2 (two) times daily with a meal. Hold if SBP sitting is >150   mirtazapine (REMERON) 30 MG tablet Take 1 tablet (30 mg total) by mouth at bedtime.   nitrofurantoin, macrocrystal-monohydrate, (MACROBID) 100 MG capsule Take 100 mg by mouth at bedtime.   ondansetron (ZOFRAN) 4 MG tablet Take 1 tablet (4 mg total) by mouth every 8 (eight) hours as needed for nausea or vomiting.   Polyethyl Glycol-Propyl Glycol (SYSTANE OP) Place 1 drop into both eyes 2 (two) times daily as needed (for dry eyes).    pravastatin (PRAVACHOL) 40 MG  tablet TAKE 1 TABLET(40 MG) BY MOUTH DAILY   Probiotic Product (PROBIOTIC DIGESTIVE SUPP PO) Take 1 capsule by mouth once.   No facility-administered encounter medications on file as of 02/17/2023.    Allergies (verified) Augmentin [amoxicillin-pot clavulanate], Lactose, Lisinopril, Other, and Bactrim [sulfamethoxazole-trimethoprim]   History: Past Medical History:  Diagnosis Date   Allergic rhinitis    Arthritis    "hands, little in my feet" (06/22/2016)   Candidiasis of vulva and vagina    Cystocele, midline    Disorder of bone and cartilage, unspecified    Fibroids    Hypertension    Large hiatal hernia 06/23/2016   Migraine    "none in the last few years" (06/22/2016)   Osteopenia    Other chronic cystitis    Other sign and symptom in breast    Paroxysmal atrial fibrillation (HCC) 11/30/2012   Pneumonia    "I've had walking pneumonia a couple times"  (06/22/2016)   Pneumonia dx'd 06/15/2016   Rectal incontinence    Stroke (HCC) 2014   hx of mini stroke    TIA (transient ischemic attack) 2015   Past Surgical History:  Procedure Laterality Date   CARDIOVERSION N/A 06/29/2016   Procedure: CARDIOVERSION;  Surgeon: Jake Bathe, MD;  Location: [facility] OR;  Service: Cardiovascular;  Laterality: N/A;   CATARACT EXTRACTION W/ INTRAOCULAR LENS IMPLANT Left    DILATION AND CURETTAGE OF UTERUS     hiatel hernia  08/20/2016   HYSTEROSCOPY WITH D & C  03/16/1999   INSERTION OF MESH N/A 08/20/2016   Procedure: INSERTION OF MESH;  Surgeon: Axel Filler, MD;  Location: [facility] ORS;  Service: General;  Laterality: N/A;   TEE WITHOUT CARDIOVERSION N/A 11/06/2012   Procedure: TRANSESOPHAGEAL ECHOCARDIOGRAM (TEE);  Surgeon: Lewayne Bunting, MD;  Location: [facility] ENDOSCOPY;  Service: Cardiovascular;  Laterality: N/A;   TONSILLECTOMY  1937   VAGINAL HYSTERECTOMY  09/2005   Anterior repair; Removal of urethral caruncle./notes 09/02/2015   Family History  Problem Relation Age of Onset   Cancer  Mother    Hypertension Mother    Heart disease Father    Heart disease Brother    Emphysema Brother    Social History   Socioeconomic History   Marital status: Widowed    Spouse name: Not on file   Number of children: 2   Years of education: Not on file   Highest education level: Not on file  Occupational History   Not on file  Tobacco Use   Smoking status: Never   Smokeless tobacco: Never   Tobacco comments:    Never smoke 10/01/21  Vaping Use   Vaping status: Never Used  Substance and Sexual Activity   Alcohol use: Yes    Alcohol/week: 1.0 standard drink of alcohol    Types: 1 Glasses of wine per week  Comment: one glass of wine once a month 10/01/21   Drug use: No   Sexual activity: Not Currently  Other Topics Concern   Not on file  Social History Narrative      Past profession was a homemaker, Diplomatic Services operational officer, and Engineer, technical sales.   Exercise by walking everyday.    Patient has a living will. POA and DNR.    Social Determinants of Health   Financial Resource Strain: Low Risk  (12/14/2020)   Overall Financial Resource Strain (CARDIA)    Difficulty of Paying Living Expenses: Not hard at all  Food Insecurity: No Food Insecurity (12/14/2020)   Hunger Vital Sign    Worried About Running Out of Food in the Last Year: Never true    Ran Out of Food in the Last Year: Never true  Transportation Needs: No Transportation Needs (12/14/2020)   PRAPARE - Administrator, Civil Service (Medical): No    Lack of Transportation (Non-Medical): No  Physical Activity: Insufficiently Active (12/14/2020)   Exercise Vital Sign    Days of Exercise per Week: 7 days    Minutes of Exercise per Session: 10 min  Stress: Stress Concern Present (12/14/2020)   [employer] of Occupational Health - Occupational Stress Questionnaire    Feeling of Stress : To some extent  Social Connections: Moderately Isolated (12/14/2020)   Social Connection and Isolation Panel [NHANES]    Frequency of  Communication with Friends and Family: Three times a week    Frequency of Social Gatherings with Friends and Family: Three times a week    Attends Religious Services: More than 4 times per year    Active Member of Clubs or Organizations: No    Attends Banker Meetings: Never    Marital Status: Widowed    Tobacco Counseling Counseling given: Not Answered Tobacco comments: Never smoke 10/01/21   Clinical Intake:  Pre-visit preparation completed: No  Pain : No/denies pain     BMI - recorded: 21.53 Nutritional Status: BMI of 19-24  Normal Nutritional Risks: Unintentional weight loss Diabetes: No  How often do you need to have someone help you when you read instructions, pamphlets, or other written materials from your doctor or pharmacy?: 3 - Sometimes What is the last grade level you completed in school?: College  Interpreter Needed?: No  Information entered by :: Fletcher Anon NP   Activities of Daily Living    02/17/2023    3:57 PM  In your present state of health, do you have any difficulty performing the following activities:  Hearing? 1  Vision? 1  Difficulty concentrating or making decisions? 1  Walking or climbing stairs? 1  Dressing or bathing? 1  Doing errands, shopping? 1  Preparing Food and eating ? Y  Using the Toilet? Y  In the past six months, have you accidently leaked urine? Y  Do you have problems with loss of bowel control? Y  Managing your Medications? Y  Managing your Finances? Y  Housekeeping or managing your Housekeeping? Y    Patient Care Team: Mahlon Gammon, MD as PCP - General (Internal Medicine) [place], [name], MD as PCP - Cardiology (Cardiology) [place], [name], MD as Consulting Physician (Cardiology) Vanessa Barbara, NP as Nurse Practitioner Noel Christmas, MD as Consulting Physician (Urology) Danice Goltz, [place] as Physician Assistant (Cardiology) Ronnald Nian, MD as Consulting Physician (Family  Medicine) Noel Christmas, MD as Consulting Physician (Urology)  Indicate any recent Medical Services you [phone]  have received from other than [facility] providers in the past year (date may be approximate).     Assessment:   This is a routine wellness examination for [place].  Hearing/Vision screen No results found.   Goals Addressed             This Visit's Progress    Prevent Falls and Injury          - use a nonslip pad with throw rugs, or remove them completely    Why is this important?   Most falls happen when it is hard for you to walk safely. Your balance may be off because of an illness. You may have pain in your knees, hip or other joints.  You may be overly tired or taking medicines that make you sleepy. You may not be able to see or hear clearly.  Falls can lead to broken bones, bruises or other injuries.  There are things you can do to help prevent falling.     Notes:        Depression Screen    02/17/2023    3:55 PM 10/14/2022    4:10 PM 08/10/2022    2:40 PM 05/11/2022    1:13 PM 10/28/2021    8:25 AM 04/22/2020    3:05 PM 02/05/2019    2:46 PM  PHQ 2/9 Scores  PHQ - 2 Score 0 1 0 0 0 0 6  PHQ- 9 Score       16    Fall Risk    02/17/2023    3:56 PM 08/16/2022    9:54 AM 08/10/2022    2:40 PM 06/28/2022    1:49 PM 05/11/2022    1:13 PM  Fall Risk   Falls in the past year? 1 1 1 1 1   Number falls in past yr: 1 0 0 1 0  Injury with Fall? 1 0 0 0 0  Risk for fall due to : History of fall(s);Impaired balance/gait History of fall(s) History of fall(s) History of fall(s) Impaired mobility  Follow up Falls evaluation completed Falls evaluation completed Falls evaluation completed Falls evaluation completed Falls evaluation completed    MEDICARE RISK AT HOME: Medicare Risk at Home Any stairs in or around the home?: No If so, are there any without handrails?: No Home free of loose throw rugs in walkways, pet beds, electrical cords, etc?: Yes Adequate lighting  in your home to reduce risk of falls?: Yes Life alert?: No Use of a cane, walker or w/c?: Yes Grab bars in the bathroom?: Yes Shower chair or bench in shower?: Yes Elevated toilet seat or a handicapped toilet?: Yes  TIMED UP AND GO:  Was the test performed?  No    Cognitive Function:    02/17/2023    3:56 PM  MMSE - Mini Mental State Exam  Orientation to time 2  Orientation to Place 4  Registration 3  Attention/ Calculation 3  Recall 1  Language- name 2 objects 2  Language- repeat 1  Language- follow 3 step command 3  Language- read & follow direction 1  Write a sentence 1  Copy design 0  Total score 21        12/14/2020    3:22 PM  6CIT Screen  What Year? 0 points  What month? 0 points  What time? 0 points  Count back from 20 0 points  Months in reverse 2 points  Repeat phrase 0 points  Total Score 2 points  Immunizations Immunization History  Administered Date(s) Administered   Fluad Quad(high Dose 65+) 01/09/2019, 01/19/2022   Influenza Split 12/18/2013   Influenza, High Dose Seasonal PF 01/24/2018   Influenza, Mdck, Trivalent,PF 6+ MOS(egg free) 02/08/2023   Influenza-Unspecified 01/12/2015, 01/13/2016, 01/20/2017, 01/24/2018, 01/06/2021   Moderna Covid-19 Vaccine Bivalent Booster 99yrs & up 01/30/2021   Moderna Sars-Covid-2 Vaccination 05/01/2019, 05/29/2019, 03/04/2020   Pneumococcal Conjugate-13 10/02/2013   Pneumococcal Polysaccharide-23 12/09/2014   Td 05/16/1995, 12/10/2004   Tdap 04/19/2004, 02/20/2016   Zoster Recombinant(Shingrix) 10/29/2016, 03/21/2017   Zoster, Live 02/11/2006    TDAP status: Up to date  Flu Vaccine status: Up to date  Pneumococcal vaccine status: Up to date  Covid-19 vaccine status: Completed vaccines  Qualifies for Shingles Vaccine? Yes   Zostavax completed Yes   Shingrix Completed?: Yes  Screening Tests Health Maintenance  Topic Date Due   COVID-19 Vaccine (5 - 2023-24 season) 03/05/2023 (Originally  12/19/2022)   Medicare Annual Wellness (AWV)  02/17/2024   DTaP/Tdap/Td (5 - Td or Tdap) 02/19/2026   Pneumonia Vaccine 85+ Years old  Completed   INFLUENZA VACCINE  Completed   DEXA SCAN  Completed   Zoster Vaccines- Shingrix  Completed   HPV VACCINES  Aged Out   MAMMOGRAM  Discontinued    Health Maintenance  There are no preventive care reminders to display for this patient.   Colorectal cancer screening: No longer required.   Mammogram status: No longer required due to age.  Bone Density status: Completed NA not done due to age and goals of care. Marland Kitchen Results reflect: Bone density results: OSTEOPOROSIS. Repeat every NA years.  Lung Cancer Screening: (Low Dose CT Chest recommended if Age 49-80 years, 20 pack-year currently smoking OR have quit w/in 15years.) does not qualify.   Lung Cancer Screening Referral: NA  Additional Screening:  Hepatitis C Screening: does not qualify; Completed NA  Vision Screening: Recommended annual ophthalmology exams for early detection of glaucoma and other disorders of the eye. Is the patient up to date with their annual eye exam?  Yes  Who is the provider or what is the name of the office in which the patient attends annual eye exams? [facility] If pt is not established with a provider, would they like to be referred to a provider to establish care? No .   Dental Screening: Recommended annual dental exams for proper oral hygiene  Diabetic Foot Exam: NA  Community Resource Referral / Chronic Care Management: CRR required this visit?  No   CCM required this visit?  No     Plan:     I have personally reviewed and noted the following in the patient's chart:   Medical and social history Use of alcohol, tobacco or illicit drugs  Current medications and supplements including opioid prescriptions. Patient is not currently taking opioid prescriptions. Functional ability and status Nutritional status Physical activity Advanced directives List  of other physicians Hospitalizations, surgeries, and ER visits in previous 12 months Vitals Screenings to include cognitive, depression, and falls Referrals and appointments  In addition, I have reviewed and discussed with patient certain preventive protocols, quality metrics, and best practice recommendations. A written personalized care plan for preventive services as well as general preventive health recommendations were provided to patient.     Fletcher Anon, NP   02/17/2023   After Visit Summary: [facility] to [facility]   Nurse Notes: NA

## 2023-03-11 ENCOUNTER — Non-Acute Institutional Stay (SKILLED_NURSING_FACILITY): Payer: Medicare Other | Admitting: Adult Health

## 2023-03-11 DIAGNOSIS — Z87448 Personal history of other diseases of urinary system: Secondary | ICD-10-CM

## 2023-03-11 DIAGNOSIS — I951 Orthostatic hypotension: Secondary | ICD-10-CM | POA: Diagnosis not present

## 2023-03-11 DIAGNOSIS — G44229 Chronic tension-type headache, not intractable: Secondary | ICD-10-CM | POA: Diagnosis not present

## 2023-03-11 DIAGNOSIS — F0390 Unspecified dementia without behavioral disturbance: Secondary | ICD-10-CM

## 2023-03-11 DIAGNOSIS — I480 Paroxysmal atrial fibrillation: Secondary | ICD-10-CM

## 2023-03-11 DIAGNOSIS — K580 Irritable bowel syndrome with diarrhea: Secondary | ICD-10-CM

## 2023-03-11 DIAGNOSIS — F329 Major depressive disorder, single episode, unspecified: Secondary | ICD-10-CM

## 2023-03-13 ENCOUNTER — Encounter: Payer: Self-pay | Admitting: Adult Health

## 2023-03-13 NOTE — Progress Notes (Signed)
Location:  Medical illustrator of Service:  SNF (31) Provider:  Fletcher Anon, NP    Patient Care Team: Mahlon Gammon, MD as PCP - General (Internal Medicine) [place], [name], MD as PCP - Cardiology (Cardiology) [place], [name], MD as Consulting Physician (Cardiology) Vanessa Barbara, NP as Nurse Practitioner Noel Christmas, MD as Consulting Physician (Urology) Danice Goltz, [place] as Physician Assistant (Cardiology) Ronnald Nian, MD as Consulting Physician (Family Medicine) Noel Christmas, MD as Consulting Physician (Urology)  Extended Emergency Contact Information Primary Emergency Contact: [place],Dr. Alphia Moh, [place] D[place] of [place] Home Phone: [phone] Mobile Phone: [phone] Relation: Son Secondary Emergency Contact: [facility] Address: [address]          [place], [place] [address] Darden Amber of [place] Home Phone: [phone] Relation: Daughter  Code Status:  DNR Goals of care: Advanced Directive information    02/17/2023    3:58 PM  Advanced Directives  Does Patient Have a Medical Advance Directive? Yes  Type of Estate agent of [place];Living will;Out of facility DNR (pink MOST or yellow form)  Copy of Healthcare Power of Attorney in Chart? Yes - validated most recent copy scanned in chart (See row information)  Pre-existing out of facility DNR order (yellow form or pink MOST form) Pink MOST/Yellow Form most recent copy in chart - Physician notified to receive inpatient order     Chief Complaint  Patient presents with   Medical Management of Chronic Issues    HPI:  Pt is a 89 y.o. female seen today for medical management of chronic diseases.    PMH significant for MCI, HTN, Afib, HLD, TIA, osteoporosis, nissen fundoplication, chronic leg edema, recurrent UTI, anxiety, falls, orthostatic hypotension, IBS  She is taking lactaid for lactose intolerance and IBS  related symptoms. Avoiding corn as well. She reports that it helped some but is not consistently working. Due to her memory loss the hx if difficult to obtain. She is denying any fever or abd pain. Overall feels well. Works with dietary regularly. Tries to avoid triggers. Hx of reported rectal prolapse. Takes one colace each night. I do not see evidence of frequent loose stools listed in the matrix record. She has used imodium one time in the past month.    Her weight is trending up after a period of weight loss.  Wt Readings from Last 3 Encounters:  03/13/23 137 lb (62.1 kg)  02/17/23 133 lb 6.4 oz (60.5 kg)  02/10/23 133 lb 6.4 oz (60.5 kg)    Followed by [facility] for depression and anxiety. Her mood is improved. Less anxiety. Pt denies any feelings of depression at this time.   Placed on scheduled tylenol for chronic headaches. No reports of headaches today.    No recent falls.   MMSE 21/16 February 2023 Needs help with bathing and dressing. Able to walk with a walker. Memory worsening over time.   Takes midodrine for orthostatic hypotension.   Has hx of recurrent UTI, no dysuria or frequency at this time.   Had dental extraction 9/19 hx of prior infection. No new issues.   11/2022 EKG showed SR 1st degree AV block left anterior hemi block QTc .486 Mg 2.1  Past Medical History:  Diagnosis Date   Allergic rhinitis    Arthritis    "hands, little in my feet" (06/22/2016)   Candidiasis of vulva and vagina  Cystocele, midline    Disorder of bone and cartilage, unspecified    Fibroids    Hypertension    Large hiatal hernia 06/23/2016   Migraine    "none in the last few years" (06/22/2016)   Osteopenia    Other chronic cystitis    Other sign and symptom in breast    Paroxysmal atrial fibrillation (HCC) 11/30/2012   Pneumonia    "I've had walking pneumonia a couple times"  (06/22/2016)   Pneumonia dx'd 06/15/2016   Rectal incontinence    Stroke (HCC) 2014   hx of mini stroke     TIA (transient ischemic attack) 2015   Past Surgical History:  Procedure Laterality Date   CARDIOVERSION N/A 06/29/2016   Procedure: CARDIOVERSION;  Surgeon: Jake Bathe, MD;  Location: [facility] OR;  Service: Cardiovascular;  Laterality: N/A;   CATARACT EXTRACTION W/ INTRAOCULAR LENS IMPLANT Left    DILATION AND CURETTAGE OF UTERUS     hiatel hernia  08/20/2016   HYSTEROSCOPY WITH D & C  03/16/1999   INSERTION OF MESH N/A 08/20/2016   Procedure: INSERTION OF MESH;  Surgeon: Axel Filler, MD;  Location: [facility] ORS;  Service: General;  Laterality: N/A;   TEE WITHOUT CARDIOVERSION N/A 11/06/2012   Procedure: TRANSESOPHAGEAL ECHOCARDIOGRAM (TEE);  Surgeon: Lewayne Bunting, MD;  Location: [facility] ENDOSCOPY;  Service: Cardiovascular;  Laterality: N/A;   TONSILLECTOMY  1937   VAGINAL HYSTERECTOMY  09/2005   Anterior repair; Removal of urethral caruncle./notes 09/02/2015    Allergies  Allergen Reactions   Augmentin [Amoxicillin-Pot Clavulanate]    Lactose Other (See Comments)    Dairy Sensitivity  Dairy Sensitivity     Lisinopril Other (See Comments)    DIZZY   Other     SEASONAL ALLERGIES   Bactrim [Sulfamethoxazole-Trimethoprim] Rash    Outpatient Encounter Medications as of 03/11/2023  Medication Sig   acetaminophen (TYLENOL) 500 MG tablet Take 500 mg by mouth every 6 (six) hours as needed.   acetaminophen (TYLENOL) 500 MG tablet Take 500 mg by mouth in the morning, at noon, and at bedtime.   ALPRAZolam (XANAX) 0.25 MG tablet Take 1 tablet (0.25 mg total) by mouth at bedtime.   amiodarone (PACERONE) 100 MG tablet Take 100 mg by mouth every morning.   CALCIUM CARBONATE-VIT D-MIN PO Take 2 tablets by mouth 3 (three) times a week.    CRANBERRY PO Take 450 mg by mouth daily.   docusate sodium (COLACE) 100 MG capsule Take 100 mg by mouth at bedtime.   ELIQUIS 5 MG TABS tablet TAKE 1 TABLET BY MOUTH TWICE DAILY   escitalopram (LEXAPRO) 10 MG tablet Take 20 mg by mouth daily.   estradiol  (ESTRACE) 0.1 MG/GM vaginal cream Place 1 Applicatorful vaginally 3 (three) times a week.   glucosamine-chondroitin 500-400 MG tablet Take 1 tablet by mouth in the morning and at bedtime.   lactase (LACTAID) 3000 units tablet Take 9,000 Units by mouth 3 (three) times daily with meals.   loperamide (IMODIUM A-D) 2 MG tablet Take 2 mg by mouth as needed for diarrhea or loose stools.   midodrine (PROAMATINE) 5 MG tablet Take 5 mg by mouth 2 (two) times daily with a meal. Hold if SBP sitting is >150   mirtazapine (REMERON) 30 MG tablet Take 1 tablet (30 mg total) by mouth at bedtime.   nitrofurantoin, macrocrystal-monohydrate, (MACROBID) 100 MG capsule Take 100 mg by mouth at bedtime.   ondansetron (ZOFRAN) 4 MG tablet Take 1 tablet (4 mg  total) by mouth every 8 (eight) hours as needed for nausea or vomiting.   Polyethyl Glycol-Propyl Glycol (SYSTANE OP) Place 1 drop into both eyes 2 (two) times daily as needed (for dry eyes).    pravastatin (PRAVACHOL) 40 MG tablet TAKE 1 TABLET(40 MG) BY MOUTH DAILY   Probiotic Product (PROBIOTIC DIGESTIVE SUPP PO) Take 1 capsule by mouth once.   No facility-administered encounter medications on file as of 03/11/2023.    Review of Systems  Constitutional:  Negative for activity change, appetite change, chills, diaphoresis, fatigue, fever and unexpected weight change.  HENT:  Negative for congestion.   Respiratory:  Negative for cough, shortness of breath and wheezing.   Cardiovascular:  Positive for leg swelling. Negative for chest pain and palpitations.  Gastrointestinal:  Negative for abdominal distention, abdominal pain, constipation, diarrhea, nausea, rectal pain and vomiting.       Loose stools   Genitourinary:  Negative for difficulty urinating and dysuria.  Musculoskeletal:  Positive for gait problem. Negative for arthralgias, back pain, joint swelling and myalgias.  Neurological:  Negative for dizziness, tremors, seizures, syncope, facial asymmetry,  speech difficulty, weakness, light-headedness, numbness and headaches.  Psychiatric/Behavioral:  Positive for confusion. Negative for agitation and behavioral problems.     Immunization History  Administered Date(s) Administered   Fluad Quad(high Dose 65+) 01/09/2019, 01/19/2022   Influenza Split 12/18/2013   Influenza, High Dose Seasonal PF 01/24/2018   Influenza, Mdck, Trivalent,PF 6+ MOS(egg free) 02/08/2023   Influenza-Unspecified 01/12/2015, 01/13/2016, 01/20/2017, 01/24/2018, 01/06/2021   Moderna Covid-19 Vaccine Bivalent Booster 87yrs & up 01/30/2021   Moderna Sars-Covid-2 Vaccination 05/01/2019, 05/29/2019, 03/04/2020   Pneumococcal Conjugate-13 10/02/2013   Pneumococcal Polysaccharide-23 12/09/2014   Td 05/16/1995, 12/10/2004   Tdap 04/19/2004, 02/20/2016   Zoster Recombinant(Shingrix) 10/29/2016, 03/21/2017   Zoster, Live 02/11/2006   Pertinent  Health Maintenance Due  Topic Date Due   INFLUENZA VACCINE  Completed   DEXA SCAN  Completed   MAMMOGRAM  Discontinued      05/11/2022    1:13 PM 06/28/2022    1:49 PM 08/10/2022    2:40 PM 08/16/2022    9:54 AM 02/17/2023    3:56 PM  Fall Risk  Falls in the past year? 1 1 1 1 1   Was there an injury with Fall? 0 0 0 0 1  Fall Risk Category Calculator 1 2 1 1 3   Patient at Risk for Falls Due to Impaired mobility History of fall(s) History of fall(s) History of fall(s) History of fall(s);Impaired balance/gait  Fall risk Follow up Falls evaluation completed Falls evaluation completed Falls evaluation completed Falls evaluation completed Falls evaluation completed   Functional Status Survey:    Vitals:   03/13/23 0830  BP: (!) 155/77  Pulse: 64  Resp: 20  Temp: (!) 97.2 F (36.2 C)  SpO2: 95%  Weight: 137 lb (62.1 kg)   Body mass index is 22.11 kg/m. Physical Exam Vitals and nursing note reviewed.  Constitutional:      General: She is not in acute distress.    Appearance: She is not diaphoretic.  HENT:     Head:  Normocephalic and atraumatic.     Mouth/Throat:     Mouth: Mucous membranes are moist.     Pharynx: Oropharynx is clear. No oropharyngeal exudate.  Neck:     Vascular: No JVD.  Cardiovascular:     Rate and Rhythm: Normal rate and regular rhythm.     Heart sounds: No murmur heard. Pulmonary:  Effort: Pulmonary effort is normal. No respiratory distress.     Breath sounds: Normal breath sounds. No wheezing.  Abdominal:     General: Bowel sounds are normal. There is no distension.     Palpations: Abdomen is soft.     Tenderness: There is no abdominal tenderness.  Musculoskeletal:     Comments: BLE edema +1  Skin:    General: Skin is warm and dry.  Neurological:     General: No focal deficit present.     Mental Status: She is alert. Mental status is at baseline.  Psychiatric:        Mood and Affect: Mood normal.    Labs reviewed: Recent Labs    05/20/22 1356 05/31/22 0000 08/20/22 0000 09/02/22 0000 11/28/22 0523  NA 136   < > 142 137 141  K 3.9   < > 4.3 4.5 3.7  CL 102   < > 107 100 106  CO2 26   < > 23* 27* 23  GLUCOSE 116*  --   --   --  103*  BUN 17   < > 19 16 21   CREATININE 0.99   < > 0.8 0.8 1.15*  CALCIUM 8.9   < > 8.6* 9.1 8.8*   < > = values in this interval not displayed.   Recent Labs    05/20/22 1356 11/28/22 0523  AST 24 20  ALT 15 14  ALKPHOS 43 43  BILITOT 0.5 0.4  PROT 6.7 6.6  ALBUMIN 3.6 3.4*   Recent Labs    11/28/22 0523  WBC 6.2  NEUTROABS 4.5  HGB 12.3  HCT 40.2  MCV 91.6  PLT 246   Lab Results  Component Value Date   TSH 3.663 05/20/2022   Lab Results  Component Value Date   HGBA1C 5.9 (A) 12/04/2019   Lab Results  Component Value Date   CHOL 147 05/31/2022   HDL 62 05/31/2022   LDLCALC 62 05/31/2022   TRIG 115 05/31/2022   CHOLHDL 3.1 04/24/2018    Significant Diagnostic Results in last 30 days:  No results found.  Assessment/Plan  1. Irritable bowel syndrome with diarrhea Improved with lactaid. May  consider adding metamucil Ok to use imodium prn   2. H/O chronic cystitis No symptoms right now Currently on macrodantin and estrace  3. Chronic tension-type headache, not intractable Improved with scheduled tylenol   4. Orthostatic hypotension Controlled with midodrine Held at times due to high bp  5. Incomplete rectal prolapse   6. Reactive depression Improved with Remeron and Lexapro   7. Paroxysmal atrial fibrillation (HCC) Regular on exam Rate is controlled with amiodarone. Needs LFTs and TSH q 6 months  On eliquis for CVA risk reduction   8. Dementia without behavioral disturbance    02/17/2023    3:56 PM  MMSE - Mini Mental State Exam  Orientation to time 2  Orientation to Place 4  Registration 3  Attention/ Calculation 3  Recall 1  Language- name 2 objects 2  Language- repeat 1  Language- follow 3 step command 3  Language- read & follow direction 1  Write a sentence 1  Copy design 0  Total score 21    MMSE declined from 27 to 21 in the past year Appropriate for a skilled level of care No issues with behaviors   Family/ staff Communication: nurse  Labs/tests ordered:  NA (TSH was done 02/14/23 needs to be abstracted)

## 2023-03-14 MED ORDER — PSYLLIUM 58.6 % PO PACK: 1.0000 | PACK | Freq: Every day | ORAL | Status: DC

## 2023-03-14 NOTE — Addendum Note (Signed)
Addended by: Esmond Camper on: 03/14/2023 09:50 AM   Modules accepted: Orders

## 2023-04-18 ENCOUNTER — Non-Acute Institutional Stay (SKILLED_NURSING_FACILITY): Payer: Medicare Other | Admitting: Internal Medicine

## 2023-04-18 ENCOUNTER — Encounter: Payer: Self-pay | Admitting: Internal Medicine

## 2023-04-18 DIAGNOSIS — M25552 Pain in left hip: Secondary | ICD-10-CM | POA: Diagnosis not present

## 2023-04-18 DIAGNOSIS — K580 Irritable bowel syndrome with diarrhea: Secondary | ICD-10-CM

## 2023-04-18 DIAGNOSIS — I951 Orthostatic hypotension: Secondary | ICD-10-CM

## 2023-04-18 DIAGNOSIS — B370 Candidal stomatitis: Secondary | ICD-10-CM

## 2023-04-18 DIAGNOSIS — F329 Major depressive disorder, single episode, unspecified: Secondary | ICD-10-CM

## 2023-04-18 DIAGNOSIS — I480 Paroxysmal atrial fibrillation: Secondary | ICD-10-CM | POA: Diagnosis not present

## 2023-04-18 DIAGNOSIS — N390 Urinary tract infection, site not specified: Secondary | ICD-10-CM

## 2023-04-18 DIAGNOSIS — W19XXXA Unspecified fall, initial encounter: Secondary | ICD-10-CM

## 2023-04-18 NOTE — Progress Notes (Unsigned)
[facility] Room Number: 134A Place of Service:  SNF [id number]) Provider:  Mahlon Gammon, MD  Patient Care Team: Mahlon Gammon, MD as PCP - General (Internal Medicine) [place], [name], MD as PCP - Cardiology (Cardiology) [place], [name], MD as Consulting Physician (Cardiology) Vanessa Barbara, NP as Nurse Practitioner Noel Christmas, MD as Consulting Physician (Urology) Danice Goltz, [place] as Physician Assistant (Cardiology) Ronnald Nian, MD as Consulting Physician (Family Medicine) Noel Christmas, MD as Consulting Physician (Urology)  Extended Emergency Contact Information Primary Emergency Contact: [place],Dr. Alphia Moh, [place] of [place] Home Phone: [phone] Mobile Phone: [phone] Relation: Son Secondary Emergency Contact: [facility] Address: [address]          [place], [place] [address] Darden Amber of [place] Home Phone: [phone] Relation: Daughter  Code Status:  DNR Goals of care: Advanced Directive information    04/18/2023    2:29 PM  Advanced Directives  Does Patient Have a Medical Advance Directive? Yes  Type of Advance Directive Out of facility DNR (pink MOST or yellow form)  Does patient want to make changes to medical advance directive? No - Patient declined     Chief Complaint  Patient presents with   Medical Management of Chronic Issues    Medical Management of Chronic Issues.     HPI:  Pt is a 89 y.o. female seen today for medical management of chronic diseases.   Lives in [place] in SNF   Patient has a history of PAF on Eliquis,  recurrent UTI and is on Macrobid,  HLD, hypertension,  IBS with diarrhea, anxiety.   Depression Orthostatic Hypotension on Midodrine  Acute issue Patient had a fall in the bathroom couple of days ago.  Patient does not know how.  She states her legs became weak and she fell.  Since then she is having some pain in her left  hip.  She is able to walk with her walker.  Even was able to go to her daughters home for Christmas.  Patient continues to do well. Wt Readings from Last 3 Encounters:  04/18/23 138 lb 8 oz (62.8 kg)  03/13/23 137 lb (62.1 kg)  02/17/23 133 lb 6.4 oz (60.5 kg)    Has gained some weight.  Anxiety is much more controlled.  No behavior issues.  Does have some cognitive issues.  Is able to walk with her walker and also uses wheelchair.  Denies any dizziness.   Past Medical History:  Diagnosis Date   Allergic rhinitis    Arthritis    "hands, little in my feet" (06/22/2016)   Candidiasis of vulva and vagina    Cystocele, midline    Disorder of bone and cartilage, unspecified    Fibroids    Hypertension    Large hiatal hernia 06/23/2016   Migraine    "none in the last few years" (06/22/2016)   Osteopenia    Other chronic cystitis    Other sign and symptom in breast    Paroxysmal atrial fibrillation (HCC) 11/30/2012   Pneumonia    "I've had walking pneumonia a couple times"  (06/22/2016)   Pneumonia dx'd 06/15/2016   Rectal incontinence    Stroke (HCC) 2014   hx of mini stroke    TIA (transient ischemic attack) 2015   Past Surgical History:  Procedure Laterality Date   CARDIOVERSION N/A 06/29/2016   Procedure:  CARDIOVERSION;  Surgeon: Jake Bathe, MD;  Location: [facility] OR;  Service: Cardiovascular;  Laterality: N/A;   CATARACT EXTRACTION W/ INTRAOCULAR LENS IMPLANT Left    DILATION AND CURETTAGE OF UTERUS     hiatel hernia  08/20/2016   HYSTEROSCOPY WITH D & C  03/16/1999   INSERTION OF MESH N/A 08/20/2016   Procedure: INSERTION OF MESH;  Surgeon: Axel Filler, MD;  Location: [facility] ORS;  Service: General;  Laterality: N/A;   TEE WITHOUT CARDIOVERSION N/A 11/06/2012   Procedure: TRANSESOPHAGEAL ECHOCARDIOGRAM (TEE);  Surgeon: Lewayne Bunting, MD;  Location: [facility] ENDOSCOPY;  Service: Cardiovascular;  Laterality: N/A;   TONSILLECTOMY  1937   VAGINAL HYSTERECTOMY  09/2005   Anterior  repair; Removal of urethral caruncle./notes 09/02/2015    Allergies  Allergen Reactions   Augmentin [Amoxicillin-Pot Clavulanate]    Lactose Other (See Comments)    Dairy Sensitivity  Dairy Sensitivity     Lisinopril Other (See Comments)    DIZZY   Other     SEASONAL ALLERGIES   Bactrim [Sulfamethoxazole-Trimethoprim] Rash    Outpatient Encounter Medications as of 04/18/2023  Medication Sig   acetaminophen (TYLENOL) 500 MG tablet Take 500 mg by mouth every 6 (six) hours as needed.   acetaminophen (TYLENOL) 500 MG tablet Take 500 mg by mouth in the morning, at noon, and at bedtime.   ALPRAZolam (XANAX) 0.25 MG tablet Take 1 tablet (0.25 mg total) by mouth at bedtime.   amiodarone (PACERONE) 100 MG tablet Take 100 mg by mouth every morning.   CALCIUM CARBONATE-VIT D-MIN PO Take 2 tablets by mouth 3 (three) times a week.    CRANBERRY PO Take 450 mg by mouth daily.   docusate sodium (COLACE) 100 MG capsule Take 100 mg by mouth daily as needed.   ELIQUIS 5 MG TABS tablet TAKE 1 TABLET BY MOUTH TWICE DAILY   escitalopram (LEXAPRO) 10 MG tablet Take 20 mg by mouth daily.   estradiol (ESTRACE) 0.1 MG/GM vaginal cream Place 1 Applicatorful vaginally 3 (three) times a week.   glucosamine-chondroitin 500-400 MG tablet Take 1 tablet by mouth in the morning and at bedtime.   lactase (LACTAID) 3000 units tablet Take 9,000 Units by mouth 3 (three) times daily with meals.   loperamide (IMODIUM A-D) 2 MG tablet Take 2 mg by mouth as needed for diarrhea or loose stools.   midodrine (PROAMATINE) 5 MG tablet Take 5 mg by mouth 2 (two) times daily with a meal. Hold if SBP sitting is >150   mirtazapine (REMERON) 30 MG tablet Take 1 tablet (30 mg total) by mouth at bedtime.   nitrofurantoin, macrocrystal-monohydrate, (MACROBID) 100 MG capsule Take 100 mg by mouth at bedtime.   pravastatin (PRAVACHOL) 40 MG tablet TAKE 1 TABLET(40 MG) BY MOUTH DAILY   Probiotic Product (PROBIOTIC DIGESTIVE SUPP PO) Take 1  capsule by mouth once.   psyllium (METAMUCIL) 58.6 % packet Take 1 packet by mouth daily. 3.4 grams daily   [DISCONTINUED] ondansetron (ZOFRAN) 4 MG tablet Take 1 tablet (4 mg total) by mouth every 8 (eight) hours as needed for nausea or vomiting.   [DISCONTINUED] Polyethyl Glycol-Propyl Glycol (SYSTANE OP) Place 1 drop into both eyes 2 (two) times daily as needed (for dry eyes).    No facility-administered encounter medications on file as of 04/18/2023.    Review of Systems  Constitutional:  Negative for activity change and appetite change.  HENT: Negative.    Respiratory:  Negative for cough and shortness of breath.  Cardiovascular:  Negative for leg swelling.  Gastrointestinal:  Negative for constipation.  Genitourinary: Negative.   Musculoskeletal:  Positive for arthralgias and gait problem. Negative for myalgias.  Skin: Negative.   Neurological:  Negative for dizziness and weakness.  Psychiatric/Behavioral:  Negative for confusion, dysphoric mood and sleep disturbance.     Immunization History  Administered Date(s) Administered   Fluad Quad(high Dose 65+) 01/09/2019, 01/19/2022   Influenza Split 12/18/2013   Influenza, High Dose Seasonal PF 01/24/2018   Influenza, Mdck, Trivalent,PF 6+ MOS(egg free) 02/08/2023   Influenza-Unspecified 01/12/2015, 01/13/2016, 01/20/2017, 01/24/2018, 01/06/2021   Moderna Covid-19 Vaccine Bivalent Booster 65yrs & up 01/30/2021   Moderna Sars-Covid-2 Vaccination 05/01/2019, 05/29/2019, 03/04/2020   Pneumococcal Conjugate-13 10/02/2013   Pneumococcal Polysaccharide-23 12/09/2014   Td 05/16/1995, 12/10/2004   Tdap 04/19/2004, 02/20/2016   Zoster Recombinant(Shingrix) 10/29/2016, 03/21/2017   Zoster, Live 02/11/2006   Pertinent  Health Maintenance Due  Topic Date Due   INFLUENZA VACCINE  Completed   DEXA SCAN  Completed   MAMMOGRAM  Discontinued      05/11/2022    1:13 PM 06/28/2022    1:49 PM 08/10/2022    2:40 PM 08/16/2022    9:54 AM  02/17/2023    3:56 PM  Fall Risk  Falls in the past year? 1 1 1 1 1   Was there an injury with Fall? 0 0 0 0 1  Fall Risk Category Calculator 1 2 1 1 3   Patient at Risk for Falls Due to Impaired mobility History of fall(s) History of fall(s) History of fall(s) History of fall(s);Impaired balance/gait  Fall risk Follow up Falls evaluation completed Falls evaluation completed Falls evaluation completed Falls evaluation completed Falls evaluation completed   Functional Status Survey:    Vitals:   04/18/23 1422  BP: 138/72  Pulse: 60  Resp: 18  Temp: 98.4 F (36.9 C)  SpO2: 94%  Weight: 138 lb 8 oz (62.8 kg)  Height: 5\' 6"  (1.676 m)   Body mass index is 22.35 kg/m. Physical Exam Vitals reviewed.  Constitutional:      Appearance: Normal appearance.  HENT:     Head: Normocephalic.     Nose: Nose normal.     Mouth/Throat:     Mouth: Mucous membranes are moist.     Pharynx: Oropharynx is clear.  Eyes:     Pupils: Pupils are equal, round, and reactive to light.  Cardiovascular:     Rate and Rhythm: Normal rate and regular rhythm.     Pulses: Normal pulses.     Heart sounds: Normal heart sounds. No murmur heard. Pulmonary:     Effort: Pulmonary effort is normal.     Breath sounds: Normal breath sounds.  Abdominal:     General: Abdomen is flat. Bowel sounds are normal.     Palpations: Abdomen is soft.  Musculoskeletal:        General: No swelling.     Cervical back: Neck supple.  Skin:    General: Skin is warm.  Neurological:     General: No focal deficit present.     Mental Status: She is alert and oriented to person, place, and time.  Psychiatric:        Mood and Affect: Mood normal.        Thought Content: Thought content normal.     Labs reviewed: Recent Labs    05/20/22 1356 05/31/22 0000 09/02/22 0000 11/28/22 0523 12/14/22 0000  NA 136   < > 137 141 144  K  3.9   < > 4.5 3.7 4.1  CL 102   < > 100 106 108  CO2 26   < > 27* 23 24*  GLUCOSE 116*  --    --  103*  --   BUN 17   < > 16 21 22*  CREATININE 0.99   < > 0.8 1.15* 0.7  CALCIUM 8.9   < > 9.1 8.8* 8.7   < > = values in this interval not displayed.   Recent Labs    05/20/22 1356 11/28/22 0523  AST 24 20  ALT 15 14  ALKPHOS 43 43  BILITOT 0.5 0.4  PROT 6.7 6.6  ALBUMIN 3.6 3.4*   Recent Labs    11/28/22 0523 12/14/22 0000  WBC 6.2 8.4  NEUTROABS 4.5  --   HGB 12.3 11.9*  HCT 40.2 36  MCV 91.6  --   PLT 246 247   Lab Results  Component Value Date   TSH 3.663 05/20/2022   Lab Results  Component Value Date   HGBA1C 5.9 (A) 12/04/2019   Lab Results  Component Value Date   CHOL 147 05/31/2022   HDL 62 05/31/2022   LDLCALC 62 05/31/2022   TRIG 115 05/31/2022   CHOLHDL 3.1 04/24/2018    Significant Diagnostic Results in last 30 days:  No results found.  Assessment/Plan  1. Fall, initial encounter (Primary) Xray of Pelvis and Left Hip High risk due to her Cognition and Orthostatic BP   2. Left hip pain Xray ordered  3. Oral thrush Nystatin   4. Orthostatic hypotension Midodrine  5. Paroxysmal atrial fibrillation (HCC) Eliquis and AMiodarone  6. Reactive depression Remeron and Lexapro  7. Recurrent UTI Macrodantin and Estrace  8. Irritable bowel syndrome with diarrhea Immodium PRn 9 HLD LDL 62 in 2/24  Family/ staff Communication: ***  Labs/tests ordered:  ***

## 2023-05-06 ENCOUNTER — Non-Acute Institutional Stay (SKILLED_NURSING_FACILITY): Payer: Self-pay | Admitting: Adult Health

## 2023-05-06 ENCOUNTER — Encounter: Payer: Self-pay | Admitting: Adult Health

## 2023-05-06 DIAGNOSIS — B370 Candidal stomatitis: Secondary | ICD-10-CM

## 2023-05-06 MED ORDER — CLOTRIMAZOLE 10 MG MT TROC
10.0000 mg | Freq: Four times a day (QID) | OROMUCOSAL | Status: AC
Start: 2023-05-06 — End: 2023-05-13

## 2023-05-06 NOTE — Progress Notes (Signed)
Location:  Medical illustrator of Service:  SNF (31) Provider:   Peggye Ley, ANP [facility] [phone]   Mahlon Gammon, MD  Patient Care Team: Mahlon Gammon, MD as PCP - General (Internal Medicine) [place], [name], MD as PCP - Cardiology (Cardiology) [place], [name], MD as Consulting Physician (Cardiology) Vanessa Barbara, NP as Nurse Practitioner Noel Christmas, MD as Consulting Physician (Urology) Danice Goltz, [place] as Physician Assistant (Cardiology) Ronnald Nian, MD as Consulting Physician (Family Medicine) Noel Christmas, MD as Consulting Physician (Urology)  Extended Emergency Contact Information Primary Emergency Contact: Truax,Dr. Alphia Moh, [place] of [place] Home Phone: [phone] Mobile Phone: [phone] Relation: Son Secondary Emergency Contact: [facility] Address: [address]          [place], [place] [address] Darden [place] of [place] Home Phone: [phone] Relation: Daughter  Code Status:  DNR Goals of care: Advanced Directive information    04/18/2023    2:29 PM  Advanced Directives  Does Patient Have a Medical Advance Directive? Yes  Type of Advance Directive Out of facility DNR (pink MOST or yellow form)  Does patient want to make changes to medical advance directive? No - Patient declined     Chief Complaint  Patient presents with   Acute Visit    thrush    HPI:  Pt is a 89 y.o. female seen today for an acute visit for thrush  Nurse reports the patient is still having mouth stinging with meals and white patches to tongue. She has taken nystatin mouthwash for 2 weeks. Its improved but not resolved. No difficulty swallowing. No weight loss.    Past Medical History:  Diagnosis Date   Allergic rhinitis    Arthritis    "hands, little in my feet" (06/22/2016)   Candidiasis of vulva and vagina    Cystocele, midline    Disorder of bone and cartilage,  unspecified    Fibroids    Hypertension    Large hiatal hernia 06/23/2016   Migraine    "none in the last few years" (06/22/2016)   Osteopenia    Other chronic cystitis    Other sign and symptom in breast    Paroxysmal atrial fibrillation (HCC) 11/30/2012   Pneumonia    "I've had walking pneumonia a couple times"  (06/22/2016)   Pneumonia dx'd 06/15/2016   Rectal incontinence    Stroke (HCC) 2014   hx of mini stroke    TIA (transient ischemic attack) 2015   Past Surgical History:  Procedure Laterality Date   CARDIOVERSION N/A 06/29/2016   Procedure: CARDIOVERSION;  Surgeon: Jake Bathe, MD;  Location: [facility] OR;  Service: Cardiovascular;  Laterality: N/A;   CATARACT EXTRACTION W/ INTRAOCULAR LENS IMPLANT Left    DILATION AND CURETTAGE OF UTERUS     hiatel hernia  08/20/2016   HYSTEROSCOPY WITH D & C  03/16/1999   INSERTION OF MESH N/A 08/20/2016   Procedure: INSERTION OF MESH;  Surgeon: Axel Filler, MD;  Location: [facility] ORS;  Service: General;  Laterality: N/A;   TEE WITHOUT CARDIOVERSION N/A 11/06/2012   Procedure: TRANSESOPHAGEAL ECHOCARDIOGRAM (TEE);  Surgeon: Lewayne Bunting, MD;  Location: [facility] ENDOSCOPY;  Service: Cardiovascular;  Laterality: N/A;   TONSILLECTOMY  1937   VAGINAL HYSTERECTOMY  09/2005   Anterior repair; Removal of urethral caruncle./notes 09/02/2015    Allergies  Allergen Reactions   Augmentin [  Amoxicillin-Pot Clavulanate]    Lactose Other (See Comments)    Dairy Sensitivity  Dairy Sensitivity     Lisinopril Other (See Comments)    DIZZY   Other     SEASONAL ALLERGIES   Bactrim [Sulfamethoxazole-Trimethoprim] Rash    Outpatient Encounter Medications as of 05/06/2023  Medication Sig   clotrimazole (MYCELEX) 10 MG troche Take 1 tablet (10 mg total) by mouth in the morning, at noon, in the evening, and at bedtime for 7 days.   acetaminophen (TYLENOL) 500 MG tablet Take 500 mg by mouth every 6 (six) hours as needed.   acetaminophen (TYLENOL) 500 MG  tablet Take 500 mg by mouth in the morning, at noon, and at bedtime.   ALPRAZolam (XANAX) 0.25 MG tablet Take 1 tablet (0.25 mg total) by mouth at bedtime.   amiodarone (PACERONE) 100 MG tablet Take 100 mg by mouth every morning.   CALCIUM CARBONATE-VIT D-MIN PO Take 2 tablets by mouth 3 (three) times a week.    CRANBERRY PO Take 450 mg by mouth daily.   docusate sodium (COLACE) 100 MG capsule Take 100 mg by mouth daily as needed.   ELIQUIS 5 MG TABS tablet TAKE 1 TABLET BY MOUTH TWICE DAILY   escitalopram (LEXAPRO) 10 MG tablet Take 20 mg by mouth daily.   estradiol (ESTRACE) 0.1 MG/GM vaginal cream Place 1 Applicatorful vaginally 3 (three) times a week.   glucosamine-chondroitin 500-400 MG tablet Take 1 tablet by mouth in the morning and at bedtime.   lactase (LACTAID) 3000 units tablet Take 9,000 Units by mouth 3 (three) times daily with meals.   loperamide (IMODIUM A-D) 2 MG tablet Take 2 mg by mouth as needed for diarrhea or loose stools.   midodrine (PROAMATINE) 5 MG tablet Take 5 mg by mouth 2 (two) times daily with a meal. Hold if SBP sitting is >150   mirtazapine (REMERON) 30 MG tablet Take 1 tablet (30 mg total) by mouth at bedtime.   nitrofurantoin, macrocrystal-monohydrate, (MACROBID) 100 MG capsule Take 100 mg by mouth at bedtime.   pravastatin (PRAVACHOL) 40 MG tablet TAKE 1 TABLET(40 MG) BY MOUTH DAILY   Probiotic Product (PROBIOTIC DIGESTIVE SUPP PO) Take 1 capsule by mouth once.   psyllium (METAMUCIL) 58.6 % packet Take 1 packet by mouth daily. 3.4 grams daily   No facility-administered encounter medications on file as of 05/06/2023.    Review of Systems  Constitutional:  Negative for activity change, appetite change, chills, diaphoresis, fatigue, fever and unexpected weight change.  HENT:  Positive for mouth sores. Negative for congestion, postnasal drip, rhinorrhea and sinus pain.     Immunization History  Administered Date(s) Administered   Fluad Quad(high Dose 65+)  01/09/2019, 01/19/2022   Influenza Split 12/18/2013   Influenza, High Dose Seasonal PF 01/24/2018   Influenza, Mdck, Trivalent,PF 6+ MOS(egg free) 02/08/2023   Influenza-Unspecified 01/12/2015, 01/13/2016, 01/20/2017, 01/24/2018, 01/06/2021   Moderna Covid-19 Vaccine Bivalent Booster 41yrs & up 01/30/2021   Moderna Sars-Covid-2 Vaccination 05/01/2019, 05/29/2019, 03/04/2020   Pneumococcal Conjugate-13 10/02/2013   Pneumococcal Polysaccharide-23 12/09/2014   Td 05/16/1995, 12/10/2004   Tdap 04/19/2004, 02/20/2016   Zoster Recombinant(Shingrix) 10/29/2016, 03/21/2017   Zoster, Live 02/11/2006   Pertinent  Health Maintenance Due  Topic Date Due   INFLUENZA VACCINE  Completed   DEXA SCAN  Completed   MAMMOGRAM  Discontinued      05/11/2022    1:13 PM 06/28/2022    1:49 PM 08/10/2022    2:40 PM 08/16/2022  9:54 AM 02/17/2023    3:56 PM  Fall Risk  Falls in the past year? 1 1 1 1 1   Was there an injury with Fall? 0 0 0 0 1  Fall Risk Category Calculator 1 2 1 1 3   Patient at Risk for Falls Due to Impaired mobility History of fall(s) History of fall(s) History of fall(s) History of fall(s);Impaired balance/gait  Fall risk Follow up Falls evaluation completed Falls evaluation completed Falls evaluation completed Falls evaluation completed Falls evaluation completed   Functional Status Survey:    Vitals:   05/06/23 1054  BP: (!) 140/60  Pulse: 71  Temp: 98.4 F (36.9 C)   There is no height or weight on file to calculate BMI. Physical Exam Vitals and nursing note reviewed.  Constitutional:      Appearance: Normal appearance.  HENT:     Mouth/Throat:     Mouth: Mucous membranes are moist.     Pharynx: Oropharynx is clear. No oropharyngeal exudate.     Comments: White patches to tongue. No bleeding.  Neurological:     Mental Status: She is alert. Mental status is at baseline.     Labs reviewed: Recent Labs    05/20/22 1356 05/31/22 0000 09/02/22 0000 11/28/22 0523  12/14/22 0000  NA 136   < > 137 141 144  K 3.9   < > 4.5 3.7 4.1  CL 102   < > 100 106 108  CO2 26   < > 27* 23 24*  GLUCOSE 116*  --   --  103*  --   BUN 17   < > 16 21 22*  CREATININE 0.99   < > 0.8 1.15* 0.7  CALCIUM 8.9   < > 9.1 8.8* 8.7   < > = values in this interval not displayed.   Recent Labs    05/20/22 1356 11/28/22 0523  AST 24 20  ALT 15 14  ALKPHOS 43 43  BILITOT 0.5 0.4  PROT 6.7 6.6  ALBUMIN 3.6 3.4*   Recent Labs    11/28/22 0523 12/14/22 0000  WBC 6.2 8.4  NEUTROABS 4.5  --   HGB 12.3 11.9*  HCT 40.2 36  MCV 91.6  --   PLT 246 247   Lab Results  Component Value Date   TSH 3.663 05/20/2022   Lab Results  Component Value Date   HGBA1C 5.9 (A) 12/04/2019   Lab Results  Component Value Date   CHOL 147 05/31/2022   HDL 62 05/31/2022   LDLCALC 62 05/31/2022   TRIG 115 05/31/2022   CHOLHDL 3.1 04/24/2018    Significant Diagnostic Results in last 30 days:  No results found.  Assessment/Plan 1. Thrush (Primary)  - clotrimazole (MYCELEX) 10 MG troche; Take 1 tablet (10 mg total) by mouth in the morning, at noon, in the evening, and at bedtime for 7 days.    Family/ staff Communication: nurse and resident   Labs/tests ordered:  NA

## 2023-05-13 ENCOUNTER — Other Ambulatory Visit: Payer: Self-pay | Admitting: Adult Health

## 2023-05-13 DIAGNOSIS — F419 Anxiety disorder, unspecified: Secondary | ICD-10-CM

## 2023-05-13 MED ORDER — ALPRAZOLAM 0.25 MG PO TABS
0.2500 mg | ORAL_TABLET | Freq: Every day | ORAL | 5 refills | Status: AC
Start: 2023-05-13 — End: ?

## 2023-05-17 ENCOUNTER — Non-Acute Institutional Stay (SKILLED_NURSING_FACILITY): Payer: Medicare Other | Admitting: Orthopedic Surgery

## 2023-05-17 ENCOUNTER — Encounter: Payer: Self-pay | Admitting: Orthopedic Surgery

## 2023-05-17 DIAGNOSIS — I480 Paroxysmal atrial fibrillation: Secondary | ICD-10-CM

## 2023-05-17 DIAGNOSIS — N390 Urinary tract infection, site not specified: Secondary | ICD-10-CM

## 2023-05-17 DIAGNOSIS — E785 Hyperlipidemia, unspecified: Secondary | ICD-10-CM

## 2023-05-17 DIAGNOSIS — K580 Irritable bowel syndrome with diarrhea: Secondary | ICD-10-CM

## 2023-05-17 DIAGNOSIS — B370 Candidal stomatitis: Secondary | ICD-10-CM | POA: Diagnosis not present

## 2023-05-17 DIAGNOSIS — I951 Orthostatic hypotension: Secondary | ICD-10-CM | POA: Diagnosis not present

## 2023-05-17 DIAGNOSIS — F329 Major depressive disorder, single episode, unspecified: Secondary | ICD-10-CM

## 2023-05-17 DIAGNOSIS — F419 Anxiety disorder, unspecified: Secondary | ICD-10-CM

## 2023-05-17 NOTE — Progress Notes (Signed)
Location:  [facility] Room Number: 134/A Place of Service:  SNF [phone]) Provider:  Octavia Heir, NP   Mahlon Gammon, MD  Patient Care Team: Mahlon Gammon, MD as PCP - General (Internal Medicine) [place], [name], MD as PCP - Cardiology (Cardiology) [place], [name], MD as Consulting Physician (Cardiology) Vanessa Barbara, NP as Nurse Practitioner Noel Christmas, MD as Consulting Physician (Urology) Danice Goltz, [place] as Physician Assistant (Cardiology) Ronnald Nian, MD as Consulting Physician (Family Medicine) Noel Christmas, MD as Consulting Physician (Urology)  Extended Emergency Contact Information Primary Emergency Contact: Robart,Dr. Alphia Moh, [place] of [place] Home Phone: [phone] Mobile Phone: [phone] Relation: Son Secondary Emergency Contact: [facility] Address: [address]          [place], [place] [address] Darden Amber of [place] Home Phone: [phone] Relation: Daughter  Code Status:  DNR Goals of care: Advanced Directive information    04/18/2023    2:29 PM  Advanced Directives  Does Patient Have a Medical Advance Directive? Yes  Type of Advance Directive Out of facility DNR (pink MOST or yellow form)  Does patient want to make changes to medical advance directive? No - Patient declined     Chief Complaint  Patient presents with   Medical Management of Chronic Issues    HPI:  Pt is a 89 y.o. female seen today for medical management of chronic diseases.    She currently resides on the skilled nursing unit at [facility]. PMH: PAF, h/o TIA, HTN, HLD, atherosclerosis, hiatal hernia, osteopenia, anxiety and weakness.   PAF- TSH 3.66 05/2022, remains on amiodarone for rate control and Eliquis for clot prevention Oral thrush- 01/17 started on clotrimazole 10 mg po daily x 7 days, symptoms improved today, remains on daily Macrobid for UTI prevention Orthostatic  hypotension- remains on midodrine IBS- current flare, remains on psyllium and imodium prn Recurrent UTI- remains on Macrobid Depression and anxiety- doing well in SNF, no mood changes or panic attacks, Na+ 144 12/14/2022, remains on xanax, Lexapro and Remeron  HLD- remains on pravastatin  Recent blood pressures:  01/28- 135/79, 135/77  01/27- 171/61  01/26- 116/70  Recent weights:  01/01- 138.1 lbs  12/01- 138.5 lbs  11/01- 137 lbs   Past Medical History:  Diagnosis Date   Allergic rhinitis    Arthritis    "hands, little in my feet" (06/22/2016)   Candidiasis of vulva and vagina    Cystocele, midline    Disorder of bone and cartilage, unspecified    Fibroids    Hypertension    Large hiatal hernia 06/23/2016   Migraine    "none in the last few years" (06/22/2016)   Osteopenia    Other chronic cystitis    Other sign and symptom in breast    Paroxysmal atrial fibrillation (HCC) 11/30/2012   Pneumonia    "I've had walking pneumonia a couple times"  (06/22/2016)   Pneumonia dx'd 06/15/2016   Rectal incontinence    Stroke (HCC) 2014   hx of mini stroke    TIA (transient ischemic attack) 2015   Past Surgical History:  Procedure Laterality Date   CARDIOVERSION N/A 06/29/2016   Procedure: CARDIOVERSION;  Surgeon: Jake Bathe, MD;  Location: [facility] OR;  Service: Cardiovascular;  Laterality: N/A;   CATARACT EXTRACTION W/ INTRAOCULAR LENS IMPLANT Left    DILATION AND CURETTAGE OF UTERUS  hiatel hernia  08/20/2016   HYSTEROSCOPY WITH D & C  03/16/1999   INSERTION OF MESH N/A 08/20/2016   Procedure: INSERTION OF MESH;  Surgeon: Axel Filler, MD;  Location: [facility] ORS;  Service: General;  Laterality: N/A;   TEE WITHOUT CARDIOVERSION N/A 11/06/2012   Procedure: TRANSESOPHAGEAL ECHOCARDIOGRAM (TEE);  Surgeon: Lewayne Bunting, MD;  Location: [facility] ENDOSCOPY;  Service: Cardiovascular;  Laterality: N/A;   TONSILLECTOMY  1937   VAGINAL HYSTERECTOMY  09/2005   Anterior repair; Removal of  urethral caruncle./notes 09/02/2015    Allergies  Allergen Reactions   Augmentin [Amoxicillin-Pot Clavulanate]    Lactose Other (See Comments)    Dairy Sensitivity  Dairy Sensitivity     Lisinopril Other (See Comments)    DIZZY   Other     SEASONAL ALLERGIES   Bactrim [Sulfamethoxazole-Trimethoprim] Rash    Outpatient Encounter Medications as of 05/17/2023  Medication Sig   acetaminophen (TYLENOL) 500 MG tablet Take 500 mg by mouth every 6 (six) hours as needed.   acetaminophen (TYLENOL) 500 MG tablet Take 500 mg by mouth in the morning, at noon, and at bedtime.   ALPRAZolam (XANAX) 0.25 MG tablet Take 1 tablet (0.25 mg total) by mouth at bedtime.   amiodarone (PACERONE) 100 MG tablet Take 100 mg by mouth every morning.   CALCIUM CARBONATE-VIT D-MIN PO Take 2 tablets by mouth 3 (three) times a week.    CRANBERRY PO Take 450 mg by mouth daily.   docusate sodium (COLACE) 100 MG capsule Take 100 mg by mouth daily as needed.   ELIQUIS 5 MG TABS tablet TAKE 1 TABLET BY MOUTH TWICE DAILY   escitalopram (LEXAPRO) 10 MG tablet Take 20 mg by mouth daily.   estradiol (ESTRACE) 0.1 MG/GM vaginal cream Place 1 Applicatorful vaginally 3 (three) times a week.   glucosamine-chondroitin 500-400 MG tablet Take 1 tablet by mouth in the morning and at bedtime.   lactase (LACTAID) 3000 units tablet Take 9,000 Units by mouth 3 (three) times daily with meals.   loperamide (IMODIUM A-D) 2 MG tablet Take 2 mg by mouth as needed for diarrhea or loose stools.   midodrine (PROAMATINE) 5 MG tablet Take 5 mg by mouth 2 (two) times daily with a meal. Hold if SBP sitting is >150   mirtazapine (REMERON) 30 MG tablet Take 1 tablet (30 mg total) by mouth at bedtime.   nitrofurantoin, macrocrystal-monohydrate, (MACROBID) 100 MG capsule Take 100 mg by mouth at bedtime.   pravastatin (PRAVACHOL) 40 MG tablet TAKE 1 TABLET(40 MG) BY MOUTH DAILY   Probiotic Product (PROBIOTIC DIGESTIVE SUPP PO) Take 1 capsule by mouth  once.   psyllium (METAMUCIL) 58.6 % packet Take 1 packet by mouth daily. 3.4 grams daily   No facility-administered encounter medications on file as of 05/17/2023.    Review of Systems  Constitutional:  Negative for activity change and fever.  HENT:  Negative for sore throat and trouble swallowing.   Eyes:  Negative for visual disturbance.  Respiratory:  Negative for shortness of breath.   Cardiovascular:  Negative for chest pain.  Gastrointestinal:  Negative for abdominal distention and abdominal pain.  Genitourinary:  Negative for dysuria, hematuria and vaginal bleeding.  Musculoskeletal:  Positive for gait problem.  Skin:  Negative for wound.  Neurological:  Positive for weakness. Negative for dizziness and headaches.  Psychiatric/Behavioral:  Positive for confusion. Negative for dysphoric mood. The patient is not nervous/anxious.     Immunization History  Administered Date(s) Administered  Fluad Quad(high Dose 65+) 01/09/2019, 01/19/2022   Influenza Split 12/18/2013   Influenza, High Dose Seasonal PF 01/24/2018   Influenza, Mdck, Trivalent,PF 6+ MOS(egg free) 02/08/2023   Influenza-Unspecified 01/12/2015, 01/13/2016, 01/20/2017, 01/24/2018, 01/06/2021   Moderna Covid-19 Vaccine Bivalent Booster 88yrs & up 01/30/2021   Moderna Sars-Covid-2 Vaccination 05/01/2019, 05/29/2019, 03/04/2020   Pneumococcal Conjugate-13 10/02/2013   Pneumococcal Polysaccharide-23 12/09/2014   Td 05/16/1995, 12/10/2004   Tdap 04/19/2004, 02/20/2016   Zoster Recombinant(Shingrix) 10/29/2016, 03/21/2017   Zoster, Live 02/11/2006   Pertinent  Health Maintenance Due  Topic Date Due   INFLUENZA VACCINE  Completed   DEXA SCAN  Completed   MAMMOGRAM  Discontinued      05/11/2022    1:13 PM 06/28/2022    1:49 PM 08/10/2022    2:40 PM 08/16/2022    9:54 AM 02/17/2023    3:56 PM  Fall Risk  Falls in the past year? 1 1 1 1 1   Was there an injury with Fall? 0 0 0 0 1  Fall Risk Category Calculator 1 2  1 1 3   Patient at Risk for Falls Due to Impaired mobility History of fall(s) History of fall(s) History of fall(s) History of fall(s);Impaired balance/gait  Fall risk Follow up Falls evaluation completed Falls evaluation completed Falls evaluation completed Falls evaluation completed Falls evaluation completed   Functional Status Survey:    Vitals:   05/17/23 1149  BP: 135/79  Pulse: 63  Resp: 18  Temp: 97.9 F (36.6 C)  SpO2: 96%  Weight: 138 lb 1.6 oz (62.6 kg)  Height: 5\' 6"  (1.676 m)   Body mass index is 22.29 kg/m. Physical Exam Vitals reviewed.  Constitutional:      General: She is not in acute distress. HENT:     Head: Normocephalic.  Eyes:     General:        Right eye: No discharge.        Left eye: No discharge.  Cardiovascular:     Rate and Rhythm: Normal rate. Rhythm irregular.     Pulses: Normal pulses.     Heart sounds: Normal heart sounds.  Pulmonary:     Effort: Pulmonary effort is normal.     Breath sounds: Normal breath sounds.  Abdominal:     General: Bowel sounds are normal.     Palpations: Abdomen is soft.  Musculoskeletal:     Cervical back: Neck supple.     Right lower leg: Edema present.     Left lower leg: Edema present.     Comments: Non pitting, compression stockings on  Skin:    General: Skin is warm.     Capillary Refill: Capillary refill takes less than 2 seconds.  Neurological:     General: No focal deficit present.     Mental Status: She is alert. Mental status is at baseline.     Motor: Weakness present.     Gait: Gait abnormal.     Comments: Walker/wheelchair  Psychiatric:        Mood and Affect: Mood normal.     Labs reviewed: Recent Labs    05/20/22 1356 05/31/22 0000 09/02/22 0000 11/28/22 0523 12/14/22 0000  NA 136   < > 137 141 144  K 3.9   < > 4.5 3.7 4.1  CL 102   < > 100 106 108  CO2 26   < > 27* 23 24*  GLUCOSE 116*  --   --  103*  --   BUN 17   < >  16 21 22*  CREATININE 0.99   < > 0.8 1.15* 0.7   CALCIUM 8.9   < > 9.1 8.8* 8.7   < > = values in this interval not displayed.   Recent Labs    05/20/22 1356 11/28/22 0523  AST 24 20  ALT 15 14  ALKPHOS 43 43  BILITOT 0.5 0.4  PROT 6.7 6.6  ALBUMIN 3.6 3.4*   Recent Labs    11/28/22 0523 12/14/22 0000  WBC 6.2 8.4  NEUTROABS 4.5  --   HGB 12.3 11.9*  HCT 40.2 36  MCV 91.6  --   PLT 246 247   Lab Results  Component Value Date   TSH 3.663 05/20/2022   Lab Results  Component Value Date   HGBA1C 5.9 (A) 12/04/2019   Lab Results  Component Value Date   CHOL 147 05/31/2022   HDL 62 05/31/2022   LDLCALC 62 05/31/2022   TRIG 115 05/31/2022   CHOLHDL 3.1 04/24/2018    Significant Diagnostic Results in last 30 days:  No results found.  Assessment/Plan 1. Paroxysmal atrial fibrillation (HCC) (Primary) - HR< 100 with amiodarone - cont Eliquis for clot prevention  2. Oral thrush - resolved with clotrimazole - increased risk due to Macrobid use  3. Orthostatic hypotension - stable with midodrine   4. Irritable bowel syndrome with diarrhea - recent flare - cont psyllium and imodium  5. Recurrent UTI - cont Macrobid  6. Reactive depression - no mood changes - cont Lexapro and Remeron  7. Anxiety - no panic attacks - cont xanax prn  8. Hyperlipidemia with target LDL less than 130 - stable with pravastatin    Family/ staff Communication: plan discussed with patient and nurse  Labs/tests ordered:  cbc/diff, cmp, TSH, lipid panel 05/24/2023

## 2023-05-17 NOTE — Progress Notes (Signed)
Subjective:    Patient ID: [name], female    DOB: 09-21-1930, 89 y.o.   MRN: [id number]  HPI Patient is a 89 year old female seen today for medical management of chronic conditions. Patient currently resides in a skilled nurse facility at [facility]. She has expressed concerns for diarrhea without pain. She is currently ordered Imodium as needed as well as Psyllium fiber daily. The patient has a history of IBS-D, PAF on Eliquis, TIA, recurrent UTI's managed with Macrobid, hiatal hernia, hyperlipidemia, hypertension, orthostatic hypotension on Midodrine, osteopenia, depression, and anxiety.   Patient did recently have a recurrence of oral candidiasis. She was seen last on 05/06/2023 and prescribed Clotrimazole 10mg  tablet; three times a day for 7 days and has since recovered. The patient states that she is doing well and feels good. She states that she is eating and drinking ok however, did admit the need to drink more water. She states that she has had no trouble sleeping or with her mood.   Review of Systems  Constitutional: Negative.        Denies weight loss, fatigue and activity change.   HENT:         Denies head trauma, vision changes, sore throat, nasal drainage. Does not express any issues with mouth sores.Denies issues with hearing.   Eyes: Negative.   Respiratory: Negative.         Denies shortness of breath and wheezing.   Cardiovascular: Negative.        Denies chest pain, swelling, and dizziness.   Gastrointestinal:  Positive for diarrhea.       Denies abdominal pain, tenderness, nausea, and vomiting.   Endocrine: Negative.   Genitourinary: Negative.        Denies dysuria.   Neurological: Negative.        Denies headaches, weakness and dizziness.        Objective:   Physical Exam HENT:     Head: Normocephalic and atraumatic.  Eyes:     Pupils: Pupils are equal, round, and reactive to light.  Cardiovascular:     Rate and Rhythm: Normal rate. Rhythm irregular.      Pulses: Normal pulses.  Pulmonary:     Effort: Pulmonary effort is normal.     Breath sounds: Normal breath sounds.     Comments: AP lung sounds are clear. No wheezes or adventitious lung sounds noted on auscultation.  Abdominal:     General: Abdomen is flat. Bowel sounds are increased.     Palpations: Abdomen is soft.  Musculoskeletal:        General: Normal range of motion.     Cervical back: Normal range of motion and neck supple.     Right lower leg: Edema present.     Left lower leg: Edema present.     Comments: Trace non-pitting edema in patients bilateral lower extremities. Compression stockings are in place.   Skin:    General: Skin is warm.     Capillary Refill: Capillary refill takes 2 to 3 seconds.  Neurological:     Mental Status: She is alert. Mental status is at baseline.  Psychiatric:        Mood and Affect: Mood normal.     Comments: Patient is pleasant,calm, and cooperative.            Assessment & Plan:   1.Chronic IBS with  Diarrhea  -Patient encouraged to take Imodium as needed -Continue to take Psyllium Fiber as prescribed -Patient  is encouraged to eat more fiber enriched foods -Maintain adequate fluid intake    2. Recurrent Oral Candidiasis  - Patient does not endorse any issues with thrush currently -Continue to monitor for recurrence  -Continue to take Probiotic daily  3. Paroxsymal Atrial Fibrillation -Rate controlled with Amiodarone  -Patient remains on Eliquis for clot prevention  4. Orthostatic Hypotension -Controlled with midodrine   5. Chronic Urinary Tract Infection -Managed with Macrobid  6.Hyperlipidemia -Well managed with Pravastatin -At goal 05/31/2022 -Scheduled labs 05/24/2023  7.Depression and Anxiety -Patient is well appearing. Displays pleasant mood - Continue Lexapro and Remeron for depression -Continue Xanax as needed for anxiety

## 2023-05-24 LAB — CBC AND DIFFERENTIAL
HCT: 39 (ref 36–46)
Hemoglobin: 12.7 (ref 12.0–16.0)
Platelets: 251 10*3/uL (ref 150–400)
WBC: 8.1

## 2023-05-24 LAB — BASIC METABOLIC PANEL WITH GFR
BUN: 20 (ref 4–21)
CO2: 25 — AB (ref 13–22)
Chloride: 103 (ref 99–108)
Creatinine: 0.8 (ref 0.5–1.1)
Glucose: 83
Potassium: 4.2 meq/L (ref 3.5–5.1)
Sodium: 140 (ref 137–147)

## 2023-05-24 LAB — LIPID PANEL
Cholesterol: 153 (ref 0–200)
HDL: 57 (ref 35–70)
LDL Cholesterol: 74
Triglycerides: 107 (ref 40–160)

## 2023-05-24 LAB — COMPREHENSIVE METABOLIC PANEL WITH GFR
Albumin: 3.8 (ref 3.5–5.0)
Calcium: 8.8 (ref 8.7–10.7)
Globulin: 3.2
eGFR: 71

## 2023-05-24 LAB — TSH: TSH: 6.51 — AB (ref 0.41–5.90)

## 2023-05-24 LAB — HEPATIC FUNCTION PANEL
ALT: 15 U/L (ref 7–35)
AST: 25 (ref 13–35)
Alkaline Phosphatase: 68 (ref 25–125)
Bilirubin, Total: 0.3

## 2023-05-24 LAB — CBC: RBC: 4.29 (ref 3.87–5.11)

## 2023-06-14 ENCOUNTER — Encounter: Payer: Self-pay | Admitting: Orthopedic Surgery

## 2023-06-14 ENCOUNTER — Non-Acute Institutional Stay (SKILLED_NURSING_FACILITY): Payer: Self-pay | Admitting: Orthopedic Surgery

## 2023-06-14 DIAGNOSIS — E785 Hyperlipidemia, unspecified: Secondary | ICD-10-CM

## 2023-06-14 DIAGNOSIS — I951 Orthostatic hypotension: Secondary | ICD-10-CM

## 2023-06-14 DIAGNOSIS — H6123 Impacted cerumen, bilateral: Secondary | ICD-10-CM | POA: Diagnosis not present

## 2023-06-14 DIAGNOSIS — I480 Paroxysmal atrial fibrillation: Secondary | ICD-10-CM

## 2023-06-14 DIAGNOSIS — R600 Localized edema: Secondary | ICD-10-CM | POA: Diagnosis not present

## 2023-06-14 DIAGNOSIS — F329 Major depressive disorder, single episode, unspecified: Secondary | ICD-10-CM

## 2023-06-14 DIAGNOSIS — F419 Anxiety disorder, unspecified: Secondary | ICD-10-CM

## 2023-06-14 DIAGNOSIS — K580 Irritable bowel syndrome with diarrhea: Secondary | ICD-10-CM

## 2023-06-14 DIAGNOSIS — N390 Urinary tract infection, site not specified: Secondary | ICD-10-CM

## 2023-06-14 NOTE — Progress Notes (Signed)
 Location:  [facility] Room Number: 134/A Place of Service:  SNF [phone]) Provider:  Octavia Heir, NP   Mahlon Gammon, MD  Patient Care Team: Mahlon Gammon, MD as PCP - General (Internal Medicine) [place], [name], MD as PCP - Cardiology (Cardiology) [place], [name], MD as Consulting Physician (Cardiology) Vanessa Barbara, NP as Nurse Practitioner Noel Christmas, MD as Consulting Physician (Urology) Danice Goltz, [place] as Physician Assistant (Cardiology) Ronnald Nian, MD as Consulting Physician (Family Medicine) Noel Christmas, MD as Consulting Physician (Urology)  Extended Emergency Contact Information Primary Emergency Contact: Hoge,Dr. Alphia Moh, [place] of [place] Home Phone: [phone] Mobile Phone: [phone] Relation: Son Secondary Emergency Contact: [facility] Address: [address]          [place], [place] [address] Darden [place] of [place] Home Phone: [phone] Relation: Daughter  Code Status:  DNR Goals of care: Advanced Directive information    04/18/2023    2:29 PM  Advanced Directives  Does Patient Have a Medical Advance Directive? Yes  Type of Advance Directive Out of facility DNR (pink MOST or yellow form)  Does patient want to make changes to medical advance directive? No - Patient declined     Chief Complaint  Patient presents with   Medical Management of Chronic Issues    HPI:  Pt is a 89 y.o. female seen today for medical management of chronic diseases.    She currently resides on the skilled nursing unit at [facility]. PMH: PAF, h/o TIA, HTN, HLD, atherosclerosis, hiatal hernia, osteopenia, anxiety and weakness.    Lower leg edema- using thigh high compression> requesting to try knee high PAF- TSH 6.51 05/24/2023, remains on amiodarone for rate control and Eliquis for clot prevention Orthostatic hypotension- remains on midodrine BID prn for SBP < 110 IBS-  remains on psyllium and imodium prn Recurrent UTI- remains on Macrobid Depression and anxiety- no mood changes or panic attacks, supportive family, Na+ 140 05/24/2023, remains on xanax, Lexapro and Remeron  HLD- remains on pravastatin  Recent blood pressures:  02/24- 149/75  02/23- 138/73  02/22- 136/68  Recent weights:  02/01- 141 lbs  01/01- 138.1 lbs  12/01- 138.5 lbs   Past Medical History:  Diagnosis Date   Allergic rhinitis    Arthritis    "hands, little in my feet" (06/22/2016)   Candidiasis of vulva and vagina    Cystocele, midline    Disorder of bone and cartilage, unspecified    Fibroids    Hypertension    Large hiatal hernia 06/23/2016   Migraine    "none in the last few years" (06/22/2016)   Osteopenia    Other chronic cystitis    Other sign and symptom in breast    Paroxysmal atrial fibrillation (HCC) 11/30/2012   Pneumonia    "I've had walking pneumonia a couple times"  (06/22/2016)   Pneumonia dx'd 06/15/2016   Rectal incontinence    Stroke (HCC) 2014   hx of mini stroke    TIA (transient ischemic attack) 2015   Past Surgical History:  Procedure Laterality Date   CARDIOVERSION N/A 06/29/2016   Procedure: CARDIOVERSION;  Surgeon: Jake Bathe, MD;  Location: [facility] OR;  Service: Cardiovascular;  Laterality: N/A;   CATARACT EXTRACTION W/ INTRAOCULAR LENS IMPLANT Left    DILATION AND CURETTAGE OF UTERUS     hiatel hernia  08/20/2016   HYSTEROSCOPY WITH  D & C  03/16/1999   INSERTION OF MESH N/A 08/20/2016   Procedure: INSERTION OF MESH;  Surgeon: Axel Filler, MD;  Location: [facility] ORS;  Service: General;  Laterality: N/A;   TEE WITHOUT CARDIOVERSION N/A 11/06/2012   Procedure: TRANSESOPHAGEAL ECHOCARDIOGRAM (TEE);  Surgeon: Lewayne Bunting, MD;  Location: [facility] ENDOSCOPY;  Service: Cardiovascular;  Laterality: N/A;   TONSILLECTOMY  1937   VAGINAL HYSTERECTOMY  09/2005   Anterior repair; Removal of urethral caruncle./notes 09/02/2015    Allergies  Allergen  Reactions   Augmentin [Amoxicillin-Pot Clavulanate]    Lactose Other (See Comments)    Dairy Sensitivity  Dairy Sensitivity     Lisinopril Other (See Comments)    DIZZY   Other     SEASONAL ALLERGIES   Bactrim [Sulfamethoxazole-Trimethoprim] Rash    Outpatient Encounter Medications as of 06/14/2023  Medication Sig   acetaminophen (TYLENOL) 500 MG tablet Take 500 mg by mouth every 6 (six) hours as needed.   acetaminophen (TYLENOL) 500 MG tablet Take 500 mg by mouth in the morning, at noon, and at bedtime.   ALPRAZolam (XANAX) 0.25 MG tablet Take 1 tablet (0.25 mg total) by mouth at bedtime.   amiodarone (PACERONE) 100 MG tablet Take 100 mg by mouth every morning.   CALCIUM CARBONATE-VIT D-MIN PO Take 2 tablets by mouth 3 (three) times a week.    CRANBERRY PO Take 450 mg by mouth daily.   docusate sodium (COLACE) 100 MG capsule Take 100 mg by mouth daily as needed.   ELIQUIS 5 MG TABS tablet TAKE 1 TABLET BY MOUTH TWICE DAILY   escitalopram (LEXAPRO) 10 MG tablet Take 20 mg by mouth daily.   estradiol (ESTRACE) 0.1 MG/GM vaginal cream Place 1 Applicatorful vaginally 3 (three) times a week.   glucosamine-chondroitin 500-400 MG tablet Take 1 tablet by mouth in the morning and at bedtime.   lactase (LACTAID) 3000 units tablet Take 9,000 Units by mouth 3 (three) times daily with meals.   loperamide (IMODIUM A-D) 2 MG tablet Take 2 mg by mouth as needed for diarrhea or loose stools.   midodrine (PROAMATINE) 5 MG tablet Take 5 mg by mouth 2 (two) times daily with a meal. Hold if SBP sitting is >150   mirtazapine (REMERON) 30 MG tablet Take 1 tablet (30 mg total) by mouth at bedtime.   nitrofurantoin, macrocrystal-monohydrate, (MACROBID) 100 MG capsule Take 100 mg by mouth at bedtime.   pravastatin (PRAVACHOL) 40 MG tablet TAKE 1 TABLET(40 MG) BY MOUTH DAILY   Probiotic Product (PROBIOTIC DIGESTIVE SUPP PO) Take 1 capsule by mouth once.   psyllium (METAMUCIL) 58.6 % packet Take 1 packet by  mouth daily. 3.4 grams daily   No facility-administered encounter medications on file as of 06/14/2023.    Review of Systems  Constitutional:  Negative for activity change and appetite change.  HENT:  Negative for sore throat and trouble swallowing.   Respiratory:  Negative for cough and shortness of breath.   Cardiovascular:  Positive for leg swelling. Negative for chest pain.  Gastrointestinal:  Negative for abdominal distention and abdominal pain.  Genitourinary:  Negative for dysuria and hematuria.  Musculoskeletal:  Positive for gait problem.  Skin:  Negative for wound.  Neurological:  Positive for weakness. Negative for dizziness and headaches.  Psychiatric/Behavioral:  Positive for dysphoric mood. Negative for confusion and sleep disturbance. The patient is nervous/anxious.     Immunization History  Administered Date(s) Administered   Fluad Quad(high Dose 65+) 01/09/2019, 01/19/2022  Influenza Split 12/18/2013   Influenza, High Dose Seasonal PF 01/24/2018   Influenza, Mdck, Trivalent,PF 6+ MOS(egg free) 02/08/2023   Influenza-Unspecified 01/12/2015, 01/13/2016, 01/20/2017, 01/24/2018, 01/06/2021   Moderna Covid-19 Vaccine Bivalent Booster 12yrs & up 01/30/2021   Moderna Sars-Covid-2 Vaccination 05/01/2019, 05/29/2019, 03/04/2020   Pneumococcal Conjugate-13 10/02/2013   Pneumococcal Polysaccharide-23 12/09/2014   Td 05/16/1995, 12/10/2004   Tdap 04/19/2004, 02/20/2016   Zoster Recombinant(Shingrix) 10/29/2016, 03/21/2017   Zoster, Live 02/11/2006   Pertinent  Health Maintenance Due  Topic Date Due   INFLUENZA VACCINE  Completed   DEXA SCAN  Completed   MAMMOGRAM  Discontinued      06/28/2022    1:49 PM 08/10/2022    2:40 PM 08/16/2022    9:54 AM 02/17/2023    3:56 PM 05/17/2023    4:08 PM  Fall Risk  Falls in the past year? 1 1 1 1 1   Was there an injury with Fall? 0 0 0 1 1  Fall Risk Category Calculator 2 1 1 3 3   Patient at Risk for Falls Due to History of  fall(s) History of fall(s) History of fall(s) History of fall(s);Impaired balance/gait History of fall(s)  Fall risk Follow up Falls evaluation completed Falls evaluation completed Falls evaluation completed Falls evaluation completed Falls evaluation completed;Education provided   Functional Status Survey:    Vitals:   06/14/23 1214  BP: (!) 149/75  Pulse: 71  Resp: 16  Temp: (!) 97.2 F (36.2 C)  SpO2: 97%  Weight: 141 lb (64 kg)  Height: 5\' 6"  (1.676 m)   Body mass index is 22.76 kg/m. Physical Exam Vitals reviewed.  Constitutional:      General: She is not in acute distress. HENT:     Head: Normocephalic.     Right Ear: There is impacted cerumen.     Left Ear: There is impacted cerumen.     Nose: Nose normal.     Mouth/Throat:     Mouth: Mucous membranes are moist.  Eyes:     General:        Right eye: No discharge.        Left eye: No discharge.  Cardiovascular:     Rate and Rhythm: Normal rate and regular rhythm.     Pulses: Normal pulses.     Heart sounds: Normal heart sounds.  Pulmonary:     Effort: Pulmonary effort is normal.     Breath sounds: Normal breath sounds.  Abdominal:     General: Bowel sounds are normal.     Palpations: Abdomen is soft.  Musculoskeletal:     Cervical back: Neck supple.     Right lower leg: Edema present.     Left lower leg: Edema present.     Comments: Non pitting  Skin:    General: Skin is warm.     Capillary Refill: Capillary refill takes less than 2 seconds.  Neurological:     General: No focal deficit present.     Mental Status: She is alert and oriented to person, place, and time.  Psychiatric:        Mood and Affect: Mood normal.     Labs reviewed: Recent Labs    09/02/22 0000 11/28/22 0523 12/14/22 0000  NA 137 141 144  K 4.5 3.7 4.1  CL 100 106 108  CO2 27* 23 24*  GLUCOSE  --  103*  --   BUN 16 21 22*  CREATININE 0.8 1.15* 0.7  CALCIUM 9.1 8.8* 8.7  Recent Labs    11/28/22 0523  AST 20  ALT  14  ALKPHOS 43  BILITOT 0.4  PROT 6.6  ALBUMIN 3.4*   Recent Labs    11/28/22 0523 12/14/22 0000  WBC 6.2 8.4  NEUTROABS 4.5  --   HGB 12.3 11.9*  HCT 40.2 36  MCV 91.6  --   PLT 246 247   Lab Results  Component Value Date   TSH 3.663 05/20/2022   Lab Results  Component Value Date   HGBA1C 5.9 (A) 12/04/2019   Lab Results  Component Value Date   CHOL 147 05/31/2022   HDL 62 05/31/2022   LDLCALC 62 05/31/2022   TRIG 115 05/31/2022   CHOLHDL 3.1 04/24/2018    Significant Diagnostic Results in last 30 days:  No results found.  Assessment/Plan 1. Bilateral impacted cerumen (Primary) - start Debrox WS protocol  - flush ears when Debrox complete  2. Lower leg edema - non pitting - doing well with thigh high compression - will try knee high compression - cont to elevate legs a few hours daily - cont low sodium diet  3. Paroxysmal atrial fibrillation (HCC) - TSH 6.51 05/24/2023 - HR< 100 with amiodarone - cont Eliquis for clot prevention - recommend rechecking TSH in a few months  4. Orthostatic hypotension - pressures stable - midodrine now BID PRN for SBP < 110  5. Irritable bowel syndrome with diarrhea - cont imodium and metamucil  6. Recurrent UTI - no recent episodes - cont nitrofurantoin  7. Reactive depression - no mood changes - Na+ stable - cont Remeron and Lexapro  8. Anxiety - no recent panic - cont xanax qhs  9. Hyperlipidemia with target LDL less than 130 - LDL stable - cont pravastatin    Family/ staff Communication: plan discussed with patient and nurse  Labs/tests ordered:  none

## 2023-07-11 ENCOUNTER — Non-Acute Institutional Stay (SKILLED_NURSING_FACILITY): Payer: Self-pay | Admitting: Internal Medicine

## 2023-07-11 DIAGNOSIS — R600 Localized edema: Secondary | ICD-10-CM | POA: Diagnosis not present

## 2023-07-11 DIAGNOSIS — I951 Orthostatic hypotension: Secondary | ICD-10-CM

## 2023-07-11 DIAGNOSIS — I480 Paroxysmal atrial fibrillation: Secondary | ICD-10-CM

## 2023-07-11 DIAGNOSIS — N390 Urinary tract infection, site not specified: Secondary | ICD-10-CM

## 2023-07-11 DIAGNOSIS — K580 Irritable bowel syndrome with diarrhea: Secondary | ICD-10-CM

## 2023-07-11 DIAGNOSIS — E785 Hyperlipidemia, unspecified: Secondary | ICD-10-CM

## 2023-07-11 DIAGNOSIS — F329 Major depressive disorder, single episode, unspecified: Secondary | ICD-10-CM

## 2023-07-11 NOTE — Progress Notes (Unsigned)
 Location:  [facility] illustrator of Service:  SNF (31)  Provider:   Code Status: DNR Goals of Care:     04/18/2023    2:29 PM  Advanced Directives  Does Patient Have a Medical Advance Directive? Yes  Type of Advance Directive Out of facility DNR (pink MOST or yellow form)  Does patient want to make changes to medical advance directive? No - Patient declined     Chief Complaint  Patient presents with   Care Management    HPI: Patient is a 89 y.o. female seen today for medical management of chronic diseases.    Lives in [place] in SNF   Patient has a history of PAF on Eliquis,  recurrent UTI and is on Macrobid,  HLD, hypertension,  IBS with diarrhea, anxiety.   Depression Orthostatic Hypotension on Midodrine   Past Medical History:  Diagnosis Date   Allergic rhinitis    Arthritis    "hands, little in my feet" (06/22/2016)   Candidiasis of vulva and vagina    Cystocele, midline    Disorder of bone and cartilage, unspecified    Fibroids    Hypertension    Large hiatal hernia 06/23/2016   Migraine    "none in the last few years" (06/22/2016)   Osteopenia    Other chronic cystitis    Other sign and symptom in breast    Paroxysmal atrial fibrillation (HCC) 11/30/2012   Pneumonia    "I've had walking pneumonia a couple times"  (06/22/2016)   Pneumonia dx'd 06/15/2016   Rectal incontinence    Stroke (HCC) 2014   hx of mini stroke    TIA (transient ischemic attack) 2015    Past Surgical History:  Procedure Laterality Date   CARDIOVERSION N/A 06/29/2016   Procedure: CARDIOVERSION;  Surgeon: Jake Bathe, MD;  Location: [facility] OR;  Service: Cardiovascular;  Laterality: N/A;   CATARACT EXTRACTION W/ INTRAOCULAR LENS IMPLANT Left    DILATION AND CURETTAGE OF UTERUS     hiatel hernia  08/20/2016   HYSTEROSCOPY WITH D & C  03/16/1999   INSERTION OF MESH N/A 08/20/2016   Procedure: INSERTION OF MESH;  Surgeon: Axel Filler, MD;  Location: [facility] ORS;   Service: General;  Laterality: N/A;   TEE WITHOUT CARDIOVERSION N/A 11/06/2012   Procedure: TRANSESOPHAGEAL ECHOCARDIOGRAM (TEE);  Surgeon: Lewayne Bunting, MD;  Location: [facility] ENDOSCOPY;  Service: Cardiovascular;  Laterality: N/A;   TONSILLECTOMY  1937   VAGINAL HYSTERECTOMY  09/2005   Anterior repair; Removal of urethral caruncle./notes 09/02/2015    Allergies  Allergen Reactions   Augmentin [Amoxicillin-Pot Clavulanate]    Lactose Other (See Comments)    Dairy Sensitivity  Dairy Sensitivity     Lisinopril Other (See Comments)    DIZZY   Other     SEASONAL ALLERGIES   Bactrim [Sulfamethoxazole-Trimethoprim] Rash    Outpatient Encounter Medications as of 07/11/2023  Medication Sig   acetaminophen (TYLENOL) 500 MG tablet Take 500 mg by mouth every 6 (six) hours as needed.   acetaminophen (TYLENOL) 500 MG tablet Take 500 mg by mouth in the morning, at noon, and at bedtime.   ALPRAZolam (XANAX) 0.25 MG tablet Take 1 tablet (0.25 mg total) by mouth at bedtime.   amiodarone (PACERONE) 100 MG tablet Take 100 mg by mouth every morning.   CALCIUM CARBONATE-VIT D-MIN PO Take 2 tablets by mouth 3 (three) times a week.    CRANBERRY PO Take 450 mg by mouth daily.  docusate sodium (COLACE) 100 MG capsule Take 100 mg by mouth daily as needed.   ELIQUIS 5 MG TABS tablet TAKE 1 TABLET BY MOUTH TWICE DAILY   escitalopram (LEXAPRO) 10 MG tablet Take 20 mg by mouth daily.   estradiol (ESTRACE) 0.1 MG/GM vaginal cream Place 1 Applicatorful vaginally 3 (three) times a week.   glucosamine-chondroitin 500-400 MG tablet Take 1 tablet by mouth in the morning and at bedtime.   lactase (LACTAID) 3000 units tablet Take 9,000 Units by mouth 3 (three) times daily with meals.   lidocaine 4 % Place 1 patch onto the skin daily as needed (for back/shoulder pain).   loperamide (IMODIUM A-D) 2 MG tablet Take 2 mg by mouth as needed for diarrhea or loose stools.   midodrine (PROAMATINE) 5 MG tablet Take 5 mg by  mouth 2 (two) times daily as needed. Hold if SBP is >110   mirtazapine (REMERON) 30 MG tablet Take 1 tablet (30 mg total) by mouth at bedtime.   nitrofurantoin, macrocrystal-monohydrate, (MACROBID) 100 MG capsule Take 100 mg by mouth at bedtime.   pravastatin (PRAVACHOL) 40 MG tablet TAKE 1 TABLET(40 MG) BY MOUTH DAILY   Probiotic Product (PROBIOTIC DIGESTIVE SUPP PO) Take 1 capsule by mouth once.   psyllium (METAMUCIL) 58.6 % packet Take 1 packet by mouth daily. 3.4 grams daily   No facility-administered encounter medications on file as of 07/11/2023.    Review of Systems:  Review of Systems  Health Maintenance  Topic Date Due   COVID-19 Vaccine (5 - 2024-25 season) 12/19/2022   Medicare Annual Wellness (AWV)  02/17/2024   DTaP/Tdap/Td (5 - Td or Tdap) 02/19/2026   Pneumonia Vaccine 31+ Years old  Completed   INFLUENZA VACCINE  Completed   DEXA SCAN  Completed   Zoster Vaccines- Shingrix  Completed   HPV VACCINES  Aged Out   MAMMOGRAM  Discontinued    Physical Exam: There were no vitals filed for this visit. There is no height or weight on file to calculate BMI. Physical Exam  Labs reviewed: Basic Metabolic Panel: Recent Labs    09/02/22 0000 11/28/22 0523 12/14/22 0000  NA 137 141 144  K 4.5 3.7 4.1  CL 100 106 108  CO2 27* 23 24*  GLUCOSE  --  103*  --   BUN 16 21 22*  CREATININE 0.8 1.15* 0.7  CALCIUM 9.1 8.8* 8.7   Liver Function Tests: Recent Labs    11/28/22 0523  AST 20  ALT 14  ALKPHOS 43  BILITOT 0.4  PROT 6.6  ALBUMIN 3.4*   Recent Labs    11/28/22 0523  LIPASE 28   No results for input(s): "AMMONIA" in the last 8760 hours. CBC: Recent Labs    11/28/22 0523 12/14/22 0000  WBC 6.2 8.4  NEUTROABS 4.5  --   HGB 12.3 11.9*  HCT 40.2 36  MCV 91.6  --   PLT 246 247   Lipid Panel: No results for input(s): "CHOL", "HDL", "LDLCALC", "TRIG", "CHOLHDL", "LDLDIRECT" in the last 8760 hours. Lab Results  Component Value Date   HGBA1C 5.9 (A)  12/04/2019    Procedures since last visit: No results found.  Assessment/Plan There are no diagnoses linked to this encounter.   Labs/tests ordered:  * No order type specified * Next appt:  Visit date not found

## 2023-07-12 ENCOUNTER — Encounter: Payer: Self-pay | Admitting: Internal Medicine

## 2023-08-04 ENCOUNTER — Non-Acute Institutional Stay (SKILLED_NURSING_FACILITY): Admitting: Orthopedic Surgery

## 2023-08-04 ENCOUNTER — Encounter: Payer: Self-pay | Admitting: Orthopedic Surgery

## 2023-08-04 DIAGNOSIS — R635 Abnormal weight gain: Secondary | ICD-10-CM

## 2023-08-04 DIAGNOSIS — N390 Urinary tract infection, site not specified: Secondary | ICD-10-CM

## 2023-08-04 DIAGNOSIS — F329 Major depressive disorder, single episode, unspecified: Secondary | ICD-10-CM

## 2023-08-04 DIAGNOSIS — I951 Orthostatic hypotension: Secondary | ICD-10-CM | POA: Diagnosis not present

## 2023-08-04 DIAGNOSIS — F411 Generalized anxiety disorder: Secondary | ICD-10-CM

## 2023-08-04 DIAGNOSIS — F0390 Unspecified dementia without behavioral disturbance: Secondary | ICD-10-CM

## 2023-08-04 DIAGNOSIS — R600 Localized edema: Secondary | ICD-10-CM | POA: Diagnosis not present

## 2023-08-04 DIAGNOSIS — I480 Paroxysmal atrial fibrillation: Secondary | ICD-10-CM | POA: Diagnosis not present

## 2023-08-04 DIAGNOSIS — K580 Irritable bowel syndrome with diarrhea: Secondary | ICD-10-CM

## 2023-08-04 DIAGNOSIS — E785 Hyperlipidemia, unspecified: Secondary | ICD-10-CM

## 2023-08-04 NOTE — Progress Notes (Signed)
 Location:  [facility] Room Number: 134/A Place of Service:  SNF [phone]) Provider:  Octavia Heir, NP   Mahlon Gammon, MD  Patient Care Team: Mahlon Gammon, MD as PCP - General (Internal Medicine) [place], [name], MD as PCP - Cardiology (Cardiology) [place], [name], MD as Consulting Physician (Cardiology) Vanessa Barbara, NP as Nurse Practitioner Noel Christmas, MD as Consulting Physician (Urology) Danice Goltz, [place] as Physician Assistant (Cardiology) Ronnald Nian, MD as Consulting Physician (Family Medicine) Noel Christmas, MD as Consulting Physician (Urology)  Extended Emergency Contact Information Primary Emergency Contact: Vanderberg,Dr. Alphia Moh, [place] Home Phone: [phone] Mobile Phone: [phone] Relation: Son Secondary Emergency Contact: [facility] Address: [address]          [place], [place] [address] [place] of [place] Home Phone: [phone] Relation: Daughter  Code Status:  DNR Goals of care: Advanced Directive information    04/18/2023    2:29 PM  Advanced Directives  Does Patient Have a Medical Advance Directive? Yes  Type of Advance Directive Out of facility DNR (pink MOST or yellow form)  Does patient want to make changes to medical advance directive? No - Patient declined     Chief Complaint  Patient presents with   Medical Management of Chronic Issues    HPI:  Pt is a 89 y.o. female seen today for medical management of chronic diseases.       She currently resides on the skilled nursing unit at [facility]. PMH: PAF, h/o TIA, HTN, HLD, atherosclerosis, hiatal hernia, osteopenia, anxiety and weakness.    PAF- TSH 6.51 05/24/2023, remains on amiodarone for rate control and Eliquis for clot prevention Dementia- MMSE 21/30 01/2023, 08/2020 CT head noted mild chronic white matter ischemic changes, no behaviors, dependent with ADLs except feeding,  ambulates with walker, not on medication Orthostatic hypotension- remains on midodrine BID prn for SBP < 110 Lower leg edema- LVEF 50% 01/20/2022, stable with compression stockings IBS- remains on psyllium and imodium prn Recurrent UTI- remains on Macrobid Depression and anxiety- no mood changes or panic attacks, supportive family, Na+ 140 05/24/2023, remains on xanax, Lexapro and Remeron  HLD- total 153, LDL 74 05/24/2023, remains on pravastatin  Recent weights:  04/01- 145.9 lbs  03/01- 141.4 lbs  02/01- 141 lbs  01/19/2023- 133.4 lbs  Recent blood pressures:  04/16- 134/90, 133/67  04/15- 112/69  Past Surgical History:  Procedure Laterality Date   CARDIOVERSION N/A 06/29/2016   Procedure: CARDIOVERSION;  Surgeon: Jake Bathe, MD;  Location: [facility] OR;  Service: Cardiovascular;  Laterality: N/A;   CATARACT EXTRACTION W/ INTRAOCULAR LENS IMPLANT Left    DILATION AND CURETTAGE OF UTERUS     hiatel hernia  08/20/2016   HYSTEROSCOPY WITH D & C  03/16/1999   INSERTION OF MESH N/A 08/20/2016   Procedure: INSERTION OF MESH;  Surgeon: Axel Filler, MD;  Location: [facility] ORS;  Service: General;  Laterality: N/A;   TEE WITHOUT CARDIOVERSION N/A 11/06/2012   Procedure: TRANSESOPHAGEAL ECHOCARDIOGRAM (TEE);  Surgeon: Lewayne Bunting, MD;  Location: [facility] ENDOSCOPY;  Service: Cardiovascular;  Laterality: N/A;   TONSILLECTOMY  1937   VAGINAL HYSTERECTOMY  09/2005   Anterior repair; Removal of urethral caruncle./notes 09/02/2015    Allergies  Allergen Reactions   Augmentin [Amoxicillin-Pot Clavulanate]    Lactose Other (See Comments)    Dairy Sensitivity  Dairy Sensitivity  Lisinopril Other (See Comments)    DIZZY   Other     SEASONAL ALLERGIES   Bactrim [Sulfamethoxazole-Trimethoprim] Rash    Outpatient Encounter Medications as of 08/04/2023  Medication Sig   acetaminophen (TYLENOL) 500 MG tablet Take 500 mg by mouth every 6 (six) hours as needed.   acetaminophen (TYLENOL) 500 MG  tablet Take 500 mg by mouth in the morning, at noon, and at bedtime.   ALPRAZolam (XANAX) 0.25 MG tablet Take 1 tablet (0.25 mg total) by mouth at bedtime.   amiodarone (PACERONE) 100 MG tablet Take 100 mg by mouth every morning.   CALCIUM CARBONATE-VIT D-MIN PO Take 2 tablets by mouth 3 (three) times a week.    CRANBERRY PO Take 450 mg by mouth daily.   docusate sodium (COLACE) 100 MG capsule Take 100 mg by mouth daily as needed.   ELIQUIS 5 MG TABS tablet TAKE 1 TABLET BY MOUTH TWICE DAILY   escitalopram (LEXAPRO) 10 MG tablet Take 20 mg by mouth daily.   estradiol (ESTRACE) 0.1 MG/GM vaginal cream Place 1 Applicatorful vaginally 3 (three) times a week.   glucosamine-chondroitin 500-400 MG tablet Take 1 tablet by mouth in the morning and at bedtime.   lactase (LACTAID) 3000 units tablet Take 9,000 Units by mouth 3 (three) times daily with meals.   lidocaine 4 % Place 1 patch onto the skin daily as needed (for back/shoulder pain).   loperamide (IMODIUM A-D) 2 MG tablet Take 2 mg by mouth as needed for diarrhea or loose stools.   midodrine (PROAMATINE) 5 MG tablet Take 5 mg by mouth 2 (two) times daily as needed. Hold if SBP is >110   mirtazapine (REMERON) 30 MG tablet Take 1 tablet (30 mg total) by mouth at bedtime.   nitrofurantoin, macrocrystal-monohydrate, (MACROBID) 100 MG capsule Take 100 mg by mouth at bedtime.   pravastatin (PRAVACHOL) 40 MG tablet TAKE 1 TABLET(40 MG) BY MOUTH DAILY   Probiotic Product (PROBIOTIC DIGESTIVE SUPP PO) Take 1 capsule by mouth once.   psyllium (METAMUCIL) 58.6 % packet Take 1 packet by mouth daily. 3.4 grams daily   No facility-administered encounter medications on file as of 08/04/2023.    Review of Systems  Constitutional:  Negative for fatigue and fever.  HENT:  Negative for sore throat and trouble swallowing.   Eyes:  Negative for visual disturbance.  Respiratory:  Negative for cough and shortness of breath.   Cardiovascular:  Positive for leg  swelling. Negative for chest pain.  Gastrointestinal:  Positive for diarrhea. Negative for abdominal distention, abdominal pain, nausea and vomiting.  Genitourinary:  Negative for dysuria, hematuria and vaginal bleeding.  Musculoskeletal:  Positive for gait problem.  Skin:  Negative for wound.  Neurological:  Positive for weakness. Negative for dizziness and headaches.  Psychiatric/Behavioral:  Positive for confusion and dysphoric mood. Negative for sleep disturbance. The patient is nervous/anxious.     Immunization History  Administered Date(s) Administered   Fluad Quad(high Dose 65+) 01/09/2019, 01/19/2022   Influenza Split 12/18/2013   Influenza, High Dose Seasonal PF 01/24/2018   Influenza, Mdck, Trivalent,PF 6+ MOS(egg free) 02/08/2023   Influenza-Unspecified 01/12/2015, 01/13/2016, 01/20/2017, 01/24/2018, 01/06/2021   Moderna Covid-19 Vaccine Bivalent Booster 29yrs & up 01/30/2021   Moderna Sars-Covid-2 Vaccination 05/01/2019, 05/29/2019, 03/04/2020   Pneumococcal Conjugate-13 10/02/2013   Pneumococcal Polysaccharide-23 12/09/2014   Td 05/16/1995, 12/10/2004   Tdap 04/19/2004, 02/20/2016   Zoster Recombinant(Shingrix) 10/29/2016, 03/21/2017   Zoster, Live 02/11/2006   Pertinent  Health Maintenance Due  Topic Date Due   INFLUENZA VACCINE  11/18/2023   DEXA SCAN  Completed   MAMMOGRAM  Discontinued      06/28/2022    1:49 PM 08/10/2022    2:40 PM 08/16/2022    9:54 AM 02/17/2023    3:56 PM 05/17/2023    4:08 PM  Fall Risk  Falls in the past year? 1 1 1 1 1   Was there an injury with Fall? 0 0 0 1 1  Fall Risk Category Calculator 2 1 1 3 3   Patient at Risk for Falls Due to History of fall(s) History of fall(s) History of fall(s) History of fall(s);Impaired balance/gait History of fall(s)  Fall risk Follow up Falls evaluation completed Falls evaluation completed Falls evaluation completed Falls evaluation completed Falls evaluation completed;Education provided   Functional  Status Survey:    Vitals:   08/04/23 1109  BP: (!) 134/90  Pulse: 64  Resp: 18  Temp: 98.6 F (37 C)  SpO2: 99%  Weight: 145 lb 14.4 oz (66.2 kg)  Height: 5\' 6"  (1.676 m)   Body mass index is 23.55 kg/m. Physical Exam Vitals reviewed.  Constitutional:      General: She is not in acute distress. HENT:     Head: Normocephalic.     Right Ear: There is no impacted cerumen.     Left Ear: There is no impacted cerumen.     Nose: Nose normal.     Mouth/Throat:     Mouth: Mucous membranes are moist.  Eyes:     General:        Right eye: No discharge.        Left eye: No discharge.  Cardiovascular:     Rate and Rhythm: Normal rate and regular rhythm.     Pulses: Normal pulses.     Heart sounds: Normal heart sounds.  Pulmonary:     Effort: Pulmonary effort is normal. No respiratory distress.     Breath sounds: Normal breath sounds. No wheezing or rales.  Abdominal:     General: Bowel sounds are normal. There is no distension.     Palpations: Abdomen is soft.     Tenderness: There is no abdominal tenderness.  Musculoskeletal:     Cervical back: Neck supple.     Right lower leg: Edema present.     Left lower leg: Edema present.     Comments: Non pitting  Skin:    General: Skin is warm.     Capillary Refill: Capillary refill takes less than 2 seconds.  Neurological:     General: No focal deficit present.     Mental Status: She is alert. Mental status is at baseline.     Motor: Weakness present.     Gait: Gait abnormal.     Comments: rolator  Psychiatric:        Mood and Affect: Mood normal.     Comments: Very pleasant, alert to self/person/place, follows commands, able to express needs     Labs reviewed: Recent Labs    09/02/22 0000 11/28/22 0523 12/14/22 0000  NA 137 141 144  K 4.5 3.7 4.1  CL 100 106 108  CO2 27* 23 24*  GLUCOSE  --  103*  --   BUN 16 21 22*  CREATININE 0.8 1.15* 0.7  CALCIUM 9.1 8.8* 8.7   Recent Labs    11/28/22 0523  AST 20  ALT  14  ALKPHOS 43  BILITOT 0.4  PROT 6.6  ALBUMIN 3.4*  Recent Labs    11/28/22 0523 12/14/22 0000  WBC 6.2 8.4  NEUTROABS 4.5  --   HGB 12.3 11.9*  HCT 40.2 36  MCV 91.6  --   PLT 246 247   Lab Results  Component Value Date   TSH 3.663 05/20/2022   Lab Results  Component Value Date   HGBA1C 5.9 (A) 12/04/2019   Lab Results  Component Value Date   CHOL 147 05/31/2022   HDL 62 05/31/2022   LDLCALC 62 05/31/2022   TRIG 115 05/31/2022   CHOLHDL 3.1 04/24/2018    Significant Diagnostic Results in last 30 days:  No results found.  Assessment/Plan 1. Paroxysmal atrial fibrillation (HCC) (Primary) - HR< 100 with amiodarone - cont Eliquis for clot prevention  2. Dementia without behavioral disturbance (HCC) - no behaviors - MMSE 21/30 - not on medication - dependent with ADLs except feeding - cont skilled nursing  3. Orthostatic hypotension - stable with midodrine  4. Lower leg edema - non pitting edema - cont compression stockings  5. Irritable bowel syndrome with diarrhea - not on metamucil wafers - cont imodium prn  6. Recurrent UTI - no recent episodes - cont Macrobid  7. Reactive depression - no mood changes - Na+ stable - cont Lexapro and mirtrazepine  8. Generalized anxiety disorder - no recent panic  - cont Lexapro and xanax  9. Hyperlipidemia with target LDL less than 130 - LDL stable with pravastatin  10. Weight gain - weight increase 12 lbs since 01/2023 - TSH stable - on Remeron - admits to enjoying desserts daily - cont monthly weights   Family/ staff Communication: plan discussed with patient and nurse  Labs/tests ordered:  none

## 2023-08-30 ENCOUNTER — Non-Acute Institutional Stay (SKILLED_NURSING_FACILITY): Payer: Self-pay | Admitting: Orthopedic Surgery

## 2023-08-30 ENCOUNTER — Encounter: Payer: Self-pay | Admitting: Orthopedic Surgery

## 2023-08-30 DIAGNOSIS — F329 Major depressive disorder, single episode, unspecified: Secondary | ICD-10-CM

## 2023-08-30 DIAGNOSIS — N390 Urinary tract infection, site not specified: Secondary | ICD-10-CM

## 2023-08-30 DIAGNOSIS — F411 Generalized anxiety disorder: Secondary | ICD-10-CM

## 2023-08-30 DIAGNOSIS — F0390 Unspecified dementia without behavioral disturbance: Secondary | ICD-10-CM

## 2023-08-30 DIAGNOSIS — K580 Irritable bowel syndrome with diarrhea: Secondary | ICD-10-CM | POA: Diagnosis not present

## 2023-08-30 DIAGNOSIS — E785 Hyperlipidemia, unspecified: Secondary | ICD-10-CM

## 2023-08-30 DIAGNOSIS — I480 Paroxysmal atrial fibrillation: Secondary | ICD-10-CM

## 2023-08-30 DIAGNOSIS — I951 Orthostatic hypotension: Secondary | ICD-10-CM

## 2023-08-30 DIAGNOSIS — R600 Localized edema: Secondary | ICD-10-CM

## 2023-08-30 NOTE — Progress Notes (Signed)
 Location:  [facility] Room Number: 134 A Place of Service:  SNF (31) Provider:  Arnetha Bhat, NP  Patient Care Team: Marguerite Shiley, MD as PCP - General (Internal Medicine) [place], [name], MD as PCP - Cardiology (Cardiology) [place], [name], MD as Consulting Physician (Cardiology) Farris Hong, NP as Nurse Practitioner Roxane Copp, MD as Consulting Physician (Urology) Twana Gal, [place] as Physician Assistant (Cardiology) Watson Hacking, MD as Consulting Physician (Family Medicine) Roxane Copp, MD as Consulting Physician (Urology)  Extended Emergency Contact Information Primary Emergency Contact: Klaiber,Dr. Neva Barban, [place] [facility] Phone: [phone] Mobile Phone: [phone] Relation: Son Secondary Emergency Contact: [facility] Address: [address]          [place], [place] [address] [place] Home Phone: [phone] Relation: Daughter  Code Status:  DNR Goals of care: Advanced Directive information    04/18/2023    2:29 PM  Advanced Directives  Does Patient Have a Medical Advance Directive? Yes  Type of Advance Directive Out of facility DNR (pink MOST or yellow form)  Does patient want to make changes to medical advance directive? No - Patient declined     Chief Complaint  Patient presents with   Medical Management of Chronic Issues    Routine visit. Discuss need for covid booster ([facility] verified)     HPI:  Pt is a 89 y.o. female seen today for medical management of chronic diseases.    She currently resides on the skilled nursing unit at [facility]. PMH: PAF, h/o TIA, HTN, HLD, atherosclerosis, hiatal hernia, osteopenia, anxiety and weakness.   IBS- 05/11 increased diarrhea> resolved with imodium , remains on lactase, psyllium and imodium  prn  PAF- TSH 6.51 05/24/2023, remains on amiodarone  for rate control and Eliquis  for clot prevention Dementia-  MMSE 21/30 01/2023, 08/2020 CT head noted mild chronic white matter ischemic changes, no behaviors, dependent with ADLs except feeding, ambulates with walker, not on medication Orthostatic hypotension- remains on midodrine  BID prn for SBP < 110 and abdominal binder Lower leg edema- LVEF 50% 01/20/2022, stable with compression stockings Recurrent UTI- remains on Macrobid  Depression and anxiety- no mood changes or panic attacks, supportive family, Na+ 140 05/24/2023, remains on xanax , Lexapro  and Remeron   HLD- total 153, LDL 74 05/24/2023, remains on pravastatin   No recent falls or injuries.   Recent blood pressures:  05/12- 116/69  05/11- 137/68  05/10- 152/66  Recent weights:  05/01- 147.5 lbs  04/01- 145.9 lbs  03/01- 141.4 lbs   Past Medical History:  Diagnosis Date   Allergic rhinitis    Arthritis    "hands, little in my feet" (06/22/2016)   Candidiasis of vulva and vagina    Cystocele, midline    Disorder of bone and cartilage, unspecified    Fibroids    Hypertension    Large hiatal hernia 06/23/2016   Migraine    "none in the last few years" (06/22/2016)   Osteopenia    Other chronic cystitis    Other sign and symptom in breast    Paroxysmal atrial fibrillation (HCC) 11/30/2012   Pneumonia    "I've had walking pneumonia a couple times"  (06/22/2016)   Pneumonia dx'd 06/15/2016   Rectal incontinence    Stroke (HCC) 2014   hx of mini stroke    TIA (transient ischemic attack) 2015   Past Surgical History:  Procedure Laterality  Date   CARDIOVERSION N/A 06/29/2016   Procedure: CARDIOVERSION;  Surgeon: Hugh Madura, MD;  Location: [facility] OR;  Service: Cardiovascular;  Laterality: N/A;   CATARACT EXTRACTION W/ INTRAOCULAR LENS IMPLANT Left    DILATION AND CURETTAGE OF UTERUS     hiatel hernia  08/20/2016   HYSTEROSCOPY WITH D & C  03/16/1999   INSERTION OF MESH N/A 08/20/2016   Procedure: INSERTION OF MESH;  Surgeon: Shela Derby, MD;  Location: [facility] ORS;  Service: General;   Laterality: N/A;   TEE WITHOUT CARDIOVERSION N/A 11/06/2012   Procedure: TRANSESOPHAGEAL ECHOCARDIOGRAM (TEE);  Surgeon: Lenise Quince, MD;  Location: [facility] ENDOSCOPY;  Service: Cardiovascular;  Laterality: N/A;   TONSILLECTOMY  1937   VAGINAL HYSTERECTOMY  09/2005   Anterior repair; Removal of urethral caruncle./notes 09/02/2015    Allergies  Allergen Reactions   Augmentin  [Amoxicillin -Pot Clavulanate]    Lactose Other (See Comments)    Dairy Sensitivity  Dairy Sensitivity     Lisinopril Other (See Comments)    DIZZY   Other     SEASONAL ALLERGIES   Bactrim [Sulfamethoxazole-Trimethoprim] Rash    Outpatient Encounter Medications as of 08/30/2023  Medication Sig   diclofenac Sodium (VOLTAREN ARTHRITIS PAIN) 1 % GEL Apply topically 2 (two) times daily as needed (1 oz).   escitalopram  (LEXAPRO ) 20 MG tablet Take 20 mg by mouth daily. In the morning 8-11 am   Lactase 9000 units TABS Take 1 tablet by mouth 3 (three) times daily. 7:30 am, 11 am, and 5 pm   acetaminophen  (TYLENOL ) 500 MG tablet Take 500 mg by mouth every 6 (six) hours as needed. (Patient not taking: Reported on 08/30/2023)   acetaminophen  (TYLENOL ) 500 MG tablet Take 500 mg by mouth in the morning, at noon, and at bedtime.   ALPRAZolam  (XANAX ) 0.25 MG tablet Take 1 tablet (0.25 mg total) by mouth at bedtime.   amiodarone  (PACERONE ) 100 MG tablet Take 100 mg by mouth every morning.   CALCIUM  CARBONATE-VIT D-MIN PO Take 2 tablets by mouth 3 (three) times a week.    CRANBERRY PO Take 450 mg by mouth daily.   docusate sodium (COLACE) 100 MG capsule Take 100 mg by mouth daily as needed.   ELIQUIS  5 MG TABS tablet TAKE 1 TABLET BY MOUTH TWICE DAILY   escitalopram  (LEXAPRO ) 10 MG tablet Take 20 mg by mouth daily. (Patient not taking: Reported on 08/30/2023)   estradiol  (ESTRACE ) 0.1 MG/GM vaginal cream Place 1 Applicatorful vaginally 3 (three) times a week.   glucosamine-chondroitin 500-400 MG tablet Take 1 tablet by mouth in  the morning and at bedtime.   lactase (LACTAID) 3000 units tablet Take 9,000 Units by mouth 3 (three) times daily with meals. (Patient not taking: Reported on 08/30/2023)   lidocaine  4 % Place 1 patch onto the skin daily as needed (for back/shoulder pain).   loperamide  (IMODIUM  A-D) 2 MG tablet Take 2 mg by mouth as needed for diarrhea or loose stools.   midodrine  (PROAMATINE ) 5 MG tablet Take 5 mg by mouth 2 (two) times daily as needed. Hold if SBP is >110   mirtazapine  (REMERON ) 30 MG tablet Take 1 tablet (30 mg total) by mouth at bedtime.   nitrofurantoin , macrocrystal-monohydrate, (MACROBID ) 100 MG capsule Take 100 mg by mouth at bedtime.   pravastatin  (PRAVACHOL ) 40 MG tablet TAKE 1 TABLET(40 MG) BY MOUTH DAILY   Probiotic Product (PROBIOTIC DIGESTIVE SUPP PO) Take 1 capsule by mouth once.   Psyllium (METAMUCIL) WAFR Take 2  Wafers by mouth daily.   No facility-administered encounter medications on file as of 08/30/2023.    Review of Systems  Constitutional:  Negative for fatigue and fever.  HENT:  Negative for sore throat and trouble swallowing.   Eyes:  Negative for visual disturbance.  Respiratory:  Negative for cough and shortness of breath.   Cardiovascular:  Positive for leg swelling. Negative for chest pain.  Gastrointestinal:  Positive for constipation and diarrhea. Negative for abdominal distention, abdominal pain, nausea and vomiting.  Genitourinary:  Negative for dysuria, frequency, hematuria and vaginal bleeding.  Musculoskeletal:  Positive for arthralgias and gait problem.  Skin:  Negative for wound.  Neurological:  Positive for weakness. Negative for dizziness, light-headedness and headaches.  Psychiatric/Behavioral:  Positive for confusion and dysphoric mood. Negative for behavioral problems and sleep disturbance. The patient is nervous/anxious.     Immunization History  Administered Date(s) Administered   Fluad Quad(high Dose 65+) 01/09/2019, 01/19/2022   Influenza  Split 12/18/2013   Influenza, High Dose Seasonal PF 01/24/2018   Influenza, Mdck, Trivalent,PF 6+ MOS(egg free) 02/08/2023   Influenza-Unspecified 01/12/2015, 01/13/2016, 01/20/2017, 01/24/2018, 01/06/2021   Moderna Covid-19 Vaccine Bivalent Booster 47yrs & up 01/30/2021   Moderna Sars-Covid-2 Vaccination 05/01/2019, 05/29/2019, 03/04/2020   Pneumococcal Conjugate-13 10/02/2013   Pneumococcal Polysaccharide-23 12/09/2014   Td 05/16/1995, 12/10/2004   Tdap 04/19/2004, 02/20/2016   Zoster Recombinant(Shingrix) 10/29/2016, 03/21/2017   Zoster, Live 02/11/2006   Pertinent  Health Maintenance Due  Topic Date Due   INFLUENZA VACCINE  11/18/2023   DEXA SCAN  Completed   MAMMOGRAM  Discontinued      06/28/2022    1:49 PM 08/10/2022    2:40 PM 08/16/2022    9:54 AM 02/17/2023    3:56 PM 05/17/2023    4:08 PM  Fall Risk  Falls in the past year? 1 1 1 1 1   Was there an injury with Fall? 0 0 0 1 1  Fall Risk Category Calculator 2 1 1 3 3   Patient at Risk for Falls Due to History of fall(s) History of fall(s) History of fall(s) History of fall(s);Impaired balance/gait History of fall(s)  Fall risk Follow up Falls evaluation completed Falls evaluation completed Falls evaluation completed Falls evaluation completed Falls evaluation completed;Education provided   Functional Status Survey:    Vitals:   08/30/23 1053  BP: 116/69  Pulse: 65  Resp: 16  Temp: (!) 97.3 F (36.3 C)  SpO2: 96%  Weight: 147 lb 8 oz (66.9 kg)  Height: 5\' 6"  (1.676 m)   Body mass index is 23.81 kg/m. Physical Exam Vitals reviewed.  Constitutional:      General: She is not in acute distress. HENT:     Head: Normocephalic.     Right Ear: There is no impacted cerumen.     Left Ear: There is no impacted cerumen.     Nose: Nose normal.     Mouth/Throat:     Mouth: Mucous membranes are moist.  Eyes:     General:        Right eye: No discharge.        Left eye: No discharge.     Pupils: Pupils are equal,  round, and reactive to light.  Cardiovascular:     Rate and Rhythm: Normal rate. Rhythm irregular.     Pulses: Normal pulses.     Heart sounds: Normal heart sounds.  Pulmonary:     Effort: Pulmonary effort is normal. No respiratory distress.     Breath  sounds: Normal breath sounds. No wheezing or rales.  Abdominal:     General: Bowel sounds are normal. There is no distension.     Palpations: Abdomen is soft.     Tenderness: There is no abdominal tenderness. There is no guarding.  Musculoskeletal:     Cervical back: Neck supple.     Right lower leg: Edema present.     Left lower leg: Edema present.     Comments: Non pitting  Skin:    General: Skin is warm.     Capillary Refill: Capillary refill takes less than 2 seconds.  Neurological:     General: No focal deficit present.     Mental Status: She is alert. Mental status is at baseline.     Motor: Weakness present.     Gait: Gait abnormal.  Psychiatric:        Mood and Affect: Mood normal.     Comments: Pleasant, follows commands, able to express needs, alert to self/person/place/situation     Labs reviewed: Recent Labs    11/28/22 0523 12/14/22 0000 05/24/23 0000  NA 141 144 140  K 3.7 4.1 4.2  CL 106 108 103  CO2 23 24* 25*  GLUCOSE 103*  --   --   BUN 21 22* 20  CREATININE 1.15* 0.7 0.8  CALCIUM  8.8* 8.7 8.8   Recent Labs    11/28/22 0523 05/24/23 0000  AST 20 25  ALT 14 15  ALKPHOS 43 68  BILITOT 0.4  --   PROT 6.6  --   ALBUMIN 3.4* 3.8   Recent Labs    11/28/22 0523 12/14/22 0000 05/24/23 0000  WBC 6.2 8.4 8.1  NEUTROABS 4.5  --   --   HGB 12.3 11.9* 12.7  HCT 40.2 36 39  MCV 91.6  --   --   PLT 246 247 251   Lab Results  Component Value Date   TSH 6.51 (A) 05/24/2023   Lab Results  Component Value Date   HGBA1C 5.9 (A) 12/04/2019   Lab Results  Component Value Date   CHOL 153 05/24/2023   HDL 57 05/24/2023   LDLCALC 74 05/24/2023   TRIG 107 05/24/2023   CHOLHDL 3.1 04/24/2018     Significant Diagnostic Results in last 30 days:  No results found.  Assessment/Plan 1. Irritable bowel syndrome with diarrhea (Primary) - 05/11 recent diarrhea> resolved with imodium   - 05/13 normal BM - exam unremarkable - cont metamucil, lactase and imodium  prn  2. Paroxysmal atrial fibrillation (HCC) - HR< 100 with amiodarone  - cont Eliquis  for clot prevention  3. Dementia without behavioral disturbance (HCC) - no behaviors - MMSE 21/30 - not on medication - ambulates with walker - cont skilled nursing  4. Orthostatic hypotension - cont midodrine  and abdominal binder  5. Lower leg edema - non pitting - cont compression stockings  6. Recurrent UTI - cont Macrobid   7. Reactive depression - no changes in mood - Na+ stable - cont Lexapro  and mirtazapine   8. Generalized anxiety disorder - no recent panic - cont Lexapro  and Xanax   9. Hyperlipidemia with target LDL less than 130 - LDL stable with pravastatin     Family/ staff Communication: plan discussed with patient and nurse  Labs/tests ordered:  none

## 2023-09-26 ENCOUNTER — Encounter: Payer: Self-pay | Admitting: Internal Medicine

## 2023-09-26 DIAGNOSIS — R600 Localized edema: Secondary | ICD-10-CM

## 2023-09-26 DIAGNOSIS — I951 Orthostatic hypotension: Secondary | ICD-10-CM

## 2023-09-26 DIAGNOSIS — I480 Paroxysmal atrial fibrillation: Secondary | ICD-10-CM

## 2023-09-26 DIAGNOSIS — R052 Subacute cough: Secondary | ICD-10-CM

## 2023-09-26 DIAGNOSIS — N390 Urinary tract infection, site not specified: Secondary | ICD-10-CM

## 2023-09-26 DIAGNOSIS — E785 Hyperlipidemia, unspecified: Secondary | ICD-10-CM

## 2023-09-26 DIAGNOSIS — F329 Major depressive disorder, single episode, unspecified: Secondary | ICD-10-CM

## 2023-09-26 NOTE — Progress Notes (Signed)
 Location:  Medical illustrator of Service:  SNF ([id number])  Provider:   Code Status: DNR Goals of Care:     04/18/2023    2:29 PM  Advanced Directives  Does Patient Have a Medical Advance Directive? Yes  Type of Advance Directive Out of facility DNR (pink MOST or yellow form)  Does patient want to make changes to medical advance directive? No - Patient declined     Chief Complaint  Patient presents with   Care Management    HPI: Patient is a 89 y.o. female seen today for medical management of chronic diseases.    Lives in [place] in SNF   Patient has a history of PAF on Eliquis ,  recurrent UTI and is on Macrobid ,  HLD, hypertension,  IBS with diarrhea, anxiety.   Depression Orthostatic Hypotension on Midodrine   Patient had some cough for last few weeks of and on   Past Medical History:  Diagnosis Date   Allergic rhinitis    Arthritis    hands, little in my feet (06/22/2016)   Candidiasis of vulva and vagina    Cystocele, midline    Disorder of bone and cartilage, unspecified    Fibroids    Hypertension    Large hiatal hernia 06/23/2016   Migraine    none in the last few years (06/22/2016)   Osteopenia    Other chronic cystitis    Other sign and symptom in breast    Paroxysmal atrial fibrillation (HCC) 11/30/2012   Pneumonia    I've had walking pneumonia a couple times  (06/22/2016)   Pneumonia dx'd 06/15/2016   Rectal incontinence    Stroke (HCC) 2014   hx of mini stroke    TIA (transient ischemic attack) 2015    Past Surgical History:  Procedure Laterality Date   CARDIOVERSION N/A 06/29/2016   Procedure: CARDIOVERSION;  Surgeon: Oneil JAYSON Parchment, MD;  Location: [facility] OR;  Service: Cardiovascular;  Laterality: N/A;   CATARACT EXTRACTION W/ INTRAOCULAR LENS IMPLANT Left    DILATION AND CURETTAGE OF UTERUS     hiatel hernia  08/20/2016   HYSTEROSCOPY WITH D & C  03/16/1999   INSERTION OF MESH N/A 08/20/2016   Procedure: INSERTION OF MESH;   Surgeon: Rubin Calamity, MD;  Location: [facility] ORS;  Service: General;  Laterality: N/A;   TEE WITHOUT CARDIOVERSION N/A 11/06/2012   Procedure: TRANSESOPHAGEAL ECHOCARDIOGRAM (TEE);  Surgeon: Redell GORMAN Shallow, MD;  Location: [facility] ENDOSCOPY;  Service: Cardiovascular;  Laterality: N/A;   TONSILLECTOMY  1937   VAGINAL HYSTERECTOMY  09/2005   Anterior repair; Removal of urethral caruncle./notes 09/02/2015    Allergies  Allergen Reactions   Augmentin  [Amoxicillin -Pot Clavulanate]    Lactose Other (See Comments)    Dairy Sensitivity  Dairy Sensitivity     Lisinopril Other (See Comments)    DIZZY   Other     SEASONAL ALLERGIES   Bactrim [Sulfamethoxazole-Trimethoprim] Rash    Outpatient Encounter Medications as of 09/26/2023  Medication Sig   acetaminophen  (TYLENOL ) 500 MG tablet Take 500 mg by mouth in the morning, at noon, and at bedtime.   ALPRAZolam  (XANAX ) 0.25 MG tablet Take 1 tablet (0.25 mg total) by mouth at bedtime.   amiodarone  (PACERONE ) 100 MG tablet Take 100 mg by mouth every morning.   CALCIUM  CARBONATE-VIT D-MIN PO Take 2 tablets by mouth 3 (three) times a week.    CRANBERRY PO Take 450 mg by mouth daily.   diclofenac  Sodium (VOLTAREN  ARTHRITIS PAIN)  1 % GEL Apply topically 2 (two) times daily as needed (1 oz).   docusate sodium (COLACE) 100 MG capsule Take 100 mg by mouth daily as needed.   ELIQUIS  5 MG TABS tablet TAKE 1 TABLET BY MOUTH TWICE DAILY   escitalopram  (LEXAPRO ) 20 MG tablet Take 20 mg by mouth daily. In the morning 8-11 am   estradiol  (ESTRACE ) 0.1 MG/GM vaginal cream Place 1 Applicatorful vaginally 3 (three) times a week.   glucosamine-chondroitin 500-400 MG tablet Take 1 tablet by mouth in the morning and at bedtime.   Lactase 9000 units TABS Take 1 tablet by mouth 3 (three) times daily. 7:30 am, 11 am, and 5 pm   lidocaine  4 % Place 1 patch onto the skin daily as needed (for back/shoulder pain).   loperamide  (IMODIUM  A-D) 2 MG tablet Take 2 mg by mouth as  needed for diarrhea or loose stools.   midodrine  (PROAMATINE ) 5 MG tablet Take 5 mg by mouth 2 (two) times daily as needed. Hold if SBP is >110   mirtazapine  (REMERON ) 30 MG tablet Take 1 tablet (30 mg total) by mouth at bedtime.   nitrofurantoin , macrocrystal-monohydrate, (MACROBID ) 100 MG capsule Take 100 mg by mouth at bedtime.   pravastatin  (PRAVACHOL ) 40 MG tablet TAKE 1 TABLET(40 MG) BY MOUTH DAILY   Probiotic Product (PROBIOTIC DIGESTIVE SUPP PO) Take 1 capsule by mouth once.   Psyllium (METAMUCIL) WAFR Take 2 Wafers by mouth daily.   No facility-administered encounter medications on file as of 09/26/2023.    Review of Systems:  Review of Systems  Health Maintenance  Topic Date Due   COVID-19 Vaccine (5 - 2024-25 season) 12/19/2022   INFLUENZA VACCINE  11/18/2023   Medicare Annual Wellness (AWV)  02/17/2024   DTaP/Tdap/Td (5 - Td or Tdap) 02/19/2026   Pneumonia Vaccine 3+ Years old  Completed   DEXA SCAN  Completed   Zoster Vaccines- Shingrix  Completed   HPV VACCINES  Aged Out   Meningococcal B Vaccine  Aged Out   MAMMOGRAM  Discontinued    Physical Exam: There were no vitals filed for this visit. There is no height or weight on file to calculate BMI. Physical Exam  Labs reviewed: Basic Metabolic Panel: Recent Labs    11/28/22 0523 12/14/22 0000 05/24/23 0000  NA 141 144 140  K 3.7 4.1 4.2  CL 106 108 103  CO2 23 24* 25*  GLUCOSE 103*  --   --   BUN 21 22* 20  CREATININE 1.15* 0.7 0.8  CALCIUM  8.8* 8.7 8.8  TSH  --   --  6.51*   Liver Function Tests: Recent Labs    11/28/22 0523 05/24/23 0000  AST 20 25  ALT 14 15  ALKPHOS 43 68  BILITOT 0.4  --   PROT 6.6  --   ALBUMIN 3.4* 3.8   Recent Labs    11/28/22 0523  LIPASE 28   No results for input(s): AMMONIA in the last 8760 hours. CBC: Recent Labs    11/28/22 0523 12/14/22 0000 05/24/23 0000  WBC 6.2 8.4 8.1  NEUTROABS 4.5  --   --   HGB 12.3 11.9* 12.7  HCT 40.2 36 39  MCV 91.6  --    --   PLT 246 247 251   Lipid Panel: Recent Labs    05/24/23 0000  CHOL 153  HDL 57  LDLCALC 74  TRIG 107   Lab Results  Component Value Date   HGBA1C 5.9 (A)  12/04/2019    Procedures since last visit: No results found.  Assessment/Plan   Labs/tests ordered:   Next appt:  Visit date not found

## 2023-09-27 ENCOUNTER — Encounter: Payer: Self-pay | Admitting: Orthopedic Surgery

## 2023-09-27 ENCOUNTER — Non-Acute Institutional Stay (SKILLED_NURSING_FACILITY): Payer: Self-pay | Admitting: Orthopedic Surgery

## 2023-09-27 DIAGNOSIS — R5381 Other malaise: Secondary | ICD-10-CM

## 2023-09-27 DIAGNOSIS — R052 Subacute cough: Secondary | ICD-10-CM

## 2023-09-27 DIAGNOSIS — R509 Fever, unspecified: Secondary | ICD-10-CM

## 2023-09-27 NOTE — Progress Notes (Unsigned)
 Location:  [facility] Room Number: 134/A Place of Service:  SNF [phone]) Provider:  Arnetha Bhat, NP   Marguerite Shiley, MD  Patient Care Team: Marguerite Shiley, MD as PCP - General (Internal Medicine) [place], [name], MD as PCP - Cardiology (Cardiology) [place], [name], MD as Consulting Physician (Cardiology) Farris Hong, NP as Nurse Practitioner Roxane Copp, MD as Consulting Physician (Urology) Twana Gal, [place] as Physician Assistant (Cardiology) Watson Hacking, MD as Consulting Physician (Family Medicine) Roxane Copp, MD as Consulting Physician (Urology)  Extended Emergency Contact Information Primary Emergency Contact: Gillian,Dr. Neva Barban, [place] [facility] Phone: [phone] Mobile Phone: [phone] Relation: Son Secondary Emergency Contact: [facility] Address: [address]          [place], [place] [address] [place] Home Phone: [phone] Relation: Daughter  Code Status:  DNR Goals of care: Advanced Directive information    04/18/2023    2:29 PM  Advanced Directives  Does Patient Have a Medical Advance Directive? Yes  Type of Advance Directive Out of facility DNR (pink MOST or yellow form)  Does patient want to make changes to medical advance directive? No - Patient declined     Chief Complaint  Patient presents with  . Acute Visit    Fever,malaise    HPI:  Pt is a 89 y.o. [place] seen today for acute visit due to fever and malaise.   She currently resides on the skilled nursing unit at [facility]. PMH: PAF, h/o TIA, HTN, HLD, atherosclerosis, hiatal hernia, osteopenia, anxiety and weakness.   06/08 she was started on Zyrtec due to congestion/allergies. She initially felt better after taking. Today, she woke up tired and had fever of 100.4.     Past Medical History:  Diagnosis Date  . Allergic rhinitis   . Arthritis    "hands, little in my  feet" (06/22/2016)  . Candidiasis of vulva and vagina   . Cystocele, midline   . Disorder of bone and cartilage, unspecified   . Fibroids   . Hypertension   . Large hiatal hernia 06/23/2016  . Migraine    "none in the last few years" (06/22/2016)  . Osteopenia   . Other chronic cystitis   . Other sign and symptom in breast   . Paroxysmal atrial fibrillation (HCC) 11/30/2012  . Pneumonia    "I've had walking pneumonia a couple times"  (06/22/2016)  . Pneumonia dx'd 06/15/2016  . Rectal incontinence   . Stroke [facility]) 2014   hx of mini stroke   . TIA (transient ischemic attack) 2015   Past Surgical History:  Procedure Laterality Date  . CARDIOVERSION N/A 06/29/2016   Procedure: CARDIOVERSION;  Surgeon: Hugh Madura, MD;  Location: [facility] OR;  Service: Cardiovascular;  Laterality: N/A;  . CATARACT EXTRACTION W/ INTRAOCULAR LENS IMPLANT Left   . DILATION AND CURETTAGE OF UTERUS    . hiatel hernia  08/20/2016  . HYSTEROSCOPY WITH D & C  03/16/1999  . INSERTION OF MESH N/A 08/20/2016   Procedure: INSERTION OF MESH;  Surgeon: Shela Derby, MD;  Location: [facility] ORS;  Service: General;  Laterality: N/A;  . TEE WITHOUT CARDIOVERSION N/A 11/06/2012   Procedure: TRANSESOPHAGEAL ECHOCARDIOGRAM (TEE);  Surgeon: Lenise Quince, MD;  Location: [facility] ENDOSCOPY;  Service: Cardiovascular;  Laterality: N/A;  . TONSILLECTOMY  1937  . VAGINAL HYSTERECTOMY  09/2005  Anterior repair; Removal of urethral caruncle./notes 09/02/2015    Allergies  Allergen Reactions  . Augmentin  [Amoxicillin -Pot Clavulanate]   . Lactose Other (See Comments)    Dairy Sensitivity  Dairy Sensitivity    . Lisinopril Other (See Comments)    DIZZY  . Other     SEASONAL ALLERGIES  . Bactrim [Sulfamethoxazole-Trimethoprim] Rash    Outpatient Encounter Medications as of 09/27/2023  Medication Sig  . acetaminophen  (TYLENOL ) 500 MG tablet Take 500 mg by mouth in the morning, at noon, and at bedtime.  . ALPRAZolam  (XANAX ) 0.25 MG  tablet Take 1 tablet (0.25 mg total) by mouth at bedtime.  . amiodarone  (PACERONE ) 100 MG tablet Take 100 mg by mouth every morning.  . CALCIUM  CARBONATE-VIT D-MIN PO Take 2 tablets by mouth 3 (three) times a week.   Aaron Aas CRANBERRY PO Take 450 mg by mouth daily.  . diclofenac Sodium (VOLTAREN ARTHRITIS PAIN) 1 % GEL Apply topically 2 (two) times daily as needed (1 oz).  . docusate sodium (COLACE) 100 MG capsule Take 100 mg by mouth daily as needed.  . ELIQUIS  5 MG TABS tablet TAKE 1 TABLET BY MOUTH TWICE DAILY  . escitalopram  (LEXAPRO ) 20 MG tablet Take 20 mg by mouth daily. In the morning 8-11 am  . estradiol  (ESTRACE ) 0.1 MG/GM vaginal cream Place 1 Applicatorful vaginally 3 (three) times a week.  Aaron Aas glucosamine-chondroitin 500-400 MG tablet Take 1 tablet by mouth in the morning and at bedtime.  . Lactase 9000 units TABS Take 1 tablet by mouth 3 (three) times daily. 7:30 am, 11 am, and 5 pm  . lidocaine  4 % Place 1 patch onto the skin daily as needed (for back/shoulder pain).  . loperamide  (IMODIUM  A-D) 2 MG tablet Take 2 mg by mouth as needed for diarrhea or loose stools.  . midodrine  (PROAMATINE ) 5 MG tablet Take 5 mg by mouth 2 (two) times daily as needed. Hold if SBP is >110  . mirtazapine  (REMERON ) 30 MG tablet Take 1 tablet (30 mg total) by mouth at bedtime.  . nitrofurantoin , macrocrystal-monohydrate, (MACROBID ) 100 MG capsule Take 100 mg by mouth at bedtime.  . pravastatin  (PRAVACHOL ) 40 MG tablet TAKE 1 TABLET(40 MG) BY MOUTH DAILY  . Probiotic Product (PROBIOTIC DIGESTIVE SUPP PO) Take 1 capsule by mouth once.  . Psyllium (METAMUCIL) WAFR Take 2 Wafers by mouth daily.   No facility-administered encounter medications on file as of 09/27/2023.    Review of Systems  Immunization History  Administered Date(s) Administered  . Fluad Quad(high Dose 65+) 01/09/2019, 01/19/2022  . Influenza Split 12/18/2013  . Influenza, High Dose Seasonal PF 01/24/2018  . Influenza, Mdck, Trivalent,PF 6+  MOS(egg free) 02/08/2023  . Influenza-Unspecified 01/12/2015, 01/13/2016, 01/20/2017, 01/24/2018, 01/06/2021  . Moderna Covid-19 Vaccine Bivalent Booster 33yrs & up 01/30/2021  . Moderna Sars-Covid-2 Vaccination 05/01/2019, 05/29/2019, 03/04/2020  . Pneumococcal Conjugate-13 10/02/2013  . Pneumococcal Polysaccharide-23 12/09/2014  . Td 05/16/1995, 12/10/2004  . Tdap 04/19/2004, 02/20/2016  . Zoster Recombinant(Shingrix) 10/29/2016, 03/21/2017  . Zoster, Live 02/11/2006   Pertinent  Health Maintenance Due  Topic Date Due  . INFLUENZA VACCINE  11/18/2023  . DEXA SCAN  Completed  . MAMMOGRAM  Discontinued      08/10/2022    2:40 PM 08/16/2022    9:54 AM 02/17/2023    3:56 PM 05/17/2023    4:08 PM 08/30/2023    1:31 PM  Fall Risk  Falls in the past year? 1 1 1 1 1   Was there an  injury with Fall? 0 0 1 1 1   Fall Risk Category Calculator 1 1 3 3 2   Patient at Risk for Falls Due to History of fall(s) History of fall(s) History of fall(s);Impaired balance/gait History of fall(s) History of fall(s);Impaired balance/gait  Fall risk Follow up Falls evaluation completed Falls evaluation completed Falls evaluation completed Falls evaluation completed;Education provided Falls evaluation completed   Functional Status Survey:    Vitals:   09/27/23 1624  BP: 126/72  Resp: 17  Temp: 98.1 F (36.7 C)  SpO2: 95%  Weight: 147 lb 8 oz (66.9 kg)  Height: 5\' 6"  (1.676 m)   Body mass index is 23.81 kg/m. Physical Exam  Labs reviewed: Recent Labs    11/28/22 0523 12/14/22 0000 05/24/23 0000  NA 141 144 140  K 3.7 4.1 4.2  CL 106 108 103  CO2 23 24* 25*  GLUCOSE 103*  --   --   BUN 21 22* 20  CREATININE 1.15* 0.7 0.8  CALCIUM  8.8* 8.7 8.8   Recent Labs    11/28/22 0523 05/24/23 0000  AST 20 25  ALT 14 15  ALKPHOS 43 68  BILITOT 0.4  --   PROT 6.6  --   ALBUMIN 3.4* 3.8   Recent Labs    11/28/22 0523 12/14/22 0000 05/24/23 0000  WBC 6.2 8.4 8.1  NEUTROABS 4.5  --   --    HGB 12.3 11.9* 12.7  HCT 40.2 36 39  MCV 91.6  --   --   PLT 246 247 251   Lab Results  Component Value Date   TSH 6.51 (A) 05/24/2023   Lab Results  Component Value Date   HGBA1C 5.9 (A) 12/04/2019   Lab Results  Component Value Date   CHOL 153 05/24/2023   HDL 57 05/24/2023   LDLCALC 74 05/24/2023   TRIG 107 05/24/2023   CHOLHDL 3.1 04/24/2018    Significant Diagnostic Results in last 30 days:  No results found.  Assessment/Plan There are no diagnoses linked to this encounter.   Family/ staff Communication: ***  Labs/tests ordered:  ***

## 2023-09-28 MED ORDER — GUAIFENESIN ER 600 MG PO TB12
600.0000 mg | ORAL_TABLET | Freq: Two times a day (BID) | ORAL | Status: AC
Start: 2023-09-28 — End: 2023-10-03

## 2023-09-28 MED ORDER — DOXYCYCLINE HYCLATE 100 MG PO TABS: 100.0000 mg | ORAL_TABLET | Freq: Two times a day (BID) | ORAL | Status: DC

## 2023-09-28 MED ORDER — IPRATROPIUM-ALBUTEROL 0.5-2.5 (3) MG/3ML IN SOLN
3.0000 mL | Freq: Three times a day (TID) | RESPIRATORY_TRACT | Status: AC
Start: 2023-09-28 — End: 2023-10-01

## 2023-09-29 ENCOUNTER — Inpatient Hospital Stay (HOSPITAL_COMMUNITY)
Admission: EM | Admit: 2023-09-29 | Discharge: 2023-10-18 | DRG: 871 | Disposition: E | Source: Skilled Nursing Facility | Attending: Internal Medicine | Admitting: Internal Medicine

## 2023-09-29 ENCOUNTER — Inpatient Hospital Stay (HOSPITAL_COMMUNITY)

## 2023-09-29 ENCOUNTER — Emergency Department (HOSPITAL_COMMUNITY)

## 2023-09-29 ENCOUNTER — Other Ambulatory Visit: Payer: Self-pay

## 2023-09-29 ENCOUNTER — Encounter (HOSPITAL_COMMUNITY): Payer: Self-pay

## 2023-09-29 DIAGNOSIS — I480 Paroxysmal atrial fibrillation: Secondary | ICD-10-CM | POA: Diagnosis present

## 2023-09-29 DIAGNOSIS — E785 Hyperlipidemia, unspecified: Secondary | ICD-10-CM | POA: Diagnosis present

## 2023-09-29 DIAGNOSIS — Z515 Encounter for palliative care: Secondary | ICD-10-CM

## 2023-09-29 DIAGNOSIS — J9621 Acute and chronic respiratory failure with hypoxia: Secondary | ICD-10-CM | POA: Diagnosis present

## 2023-09-29 DIAGNOSIS — Z880 Allergy status to penicillin: Secondary | ICD-10-CM | POA: Diagnosis not present

## 2023-09-29 DIAGNOSIS — J189 Pneumonia, unspecified organism: Secondary | ICD-10-CM | POA: Diagnosis present

## 2023-09-29 DIAGNOSIS — Z1152 Encounter for screening for COVID-19: Secondary | ICD-10-CM

## 2023-09-29 DIAGNOSIS — J9601 Acute respiratory failure with hypoxia: Secondary | ICD-10-CM | POA: Diagnosis not present

## 2023-09-29 DIAGNOSIS — G9341 Metabolic encephalopathy: Secondary | ICD-10-CM | POA: Diagnosis present

## 2023-09-29 DIAGNOSIS — Z8673 Personal history of transient ischemic attack (TIA), and cerebral infarction without residual deficits: Secondary | ICD-10-CM | POA: Diagnosis not present

## 2023-09-29 DIAGNOSIS — J90 Pleural effusion, not elsewhere classified: Secondary | ICD-10-CM | POA: Diagnosis present

## 2023-09-29 DIAGNOSIS — Z66 Do not resuscitate: Secondary | ICD-10-CM | POA: Diagnosis present

## 2023-09-29 DIAGNOSIS — E872 Acidosis, unspecified: Secondary | ICD-10-CM | POA: Diagnosis present

## 2023-09-29 DIAGNOSIS — R451 Restlessness and agitation: Secondary | ICD-10-CM | POA: Diagnosis not present

## 2023-09-29 DIAGNOSIS — F0394 Unspecified dementia, unspecified severity, with anxiety: Secondary | ICD-10-CM | POA: Diagnosis present

## 2023-09-29 DIAGNOSIS — F419 Anxiety disorder, unspecified: Secondary | ICD-10-CM | POA: Diagnosis present

## 2023-09-29 DIAGNOSIS — I509 Heart failure, unspecified: Secondary | ICD-10-CM

## 2023-09-29 DIAGNOSIS — I951 Orthostatic hypotension: Secondary | ICD-10-CM | POA: Diagnosis present

## 2023-09-29 DIAGNOSIS — Z7901 Long term (current) use of anticoagulants: Secondary | ICD-10-CM

## 2023-09-29 DIAGNOSIS — I110 Hypertensive heart disease with heart failure: Secondary | ICD-10-CM | POA: Diagnosis present

## 2023-09-29 DIAGNOSIS — N3020 Other chronic cystitis without hematuria: Secondary | ICD-10-CM | POA: Diagnosis present

## 2023-09-29 DIAGNOSIS — I5020 Unspecified systolic (congestive) heart failure: Secondary | ICD-10-CM | POA: Diagnosis present

## 2023-09-29 DIAGNOSIS — A419 Sepsis, unspecified organism: Principal | ICD-10-CM | POA: Diagnosis present

## 2023-09-29 DIAGNOSIS — R6520 Severe sepsis without septic shock: Secondary | ICD-10-CM | POA: Diagnosis present

## 2023-09-29 LAB — COMPREHENSIVE METABOLIC PANEL WITH GFR
ALT: 14 U/L (ref 0–44)
AST: 30 U/L (ref 15–41)
Albumin: 2.8 g/dL — ABNORMAL LOW (ref 3.5–5.0)
Alkaline Phosphatase: 48 U/L (ref 38–126)
Anion gap: 13 (ref 5–15)
BUN: 27 mg/dL — ABNORMAL HIGH (ref 8–23)
CO2: 21 mmol/L — ABNORMAL LOW (ref 22–32)
Calcium: 8.2 mg/dL — ABNORMAL LOW (ref 8.9–10.3)
Chloride: 105 mmol/L (ref 98–111)
Creatinine, Ser: 1 mg/dL (ref 0.44–1.00)
GFR, Estimated: 53 mL/min — ABNORMAL LOW (ref >60)
Glucose, Bld: 149 mg/dL — ABNORMAL HIGH (ref 70–99)
Potassium: 3.1 mmol/L — ABNORMAL LOW (ref 3.5–5.1)
Sodium: 139 mmol/L (ref 135–145)
Total Bilirubin: 0.9 mg/dL (ref 0.0–1.2)
Total Protein: 7.1 g/dL (ref 6.5–8.1)

## 2023-09-29 LAB — I-STAT CG4 LACTIC ACID, ED
Lactic Acid, Venous: 2.9 mmol/L (ref 0.5–1.9)
Lactic Acid, Venous: 5.3 mmol/L (ref 0.5–1.9)
Lactic Acid, Venous: 5.7 mmol/L (ref 0.5–1.9)

## 2023-09-29 LAB — CBC WITH DIFFERENTIAL/PLATELET
Abs Immature Granulocytes: 0.1 10*3/uL — ABNORMAL HIGH (ref 0.00–0.07)
Basophils Absolute: 0 10*3/uL (ref 0.0–0.1)
Basophils Relative: 0 %
Eosinophils Absolute: 0 10*3/uL (ref 0.0–0.5)
Eosinophils Relative: 0 %
HCT: 35.5 % — ABNORMAL LOW (ref 36.0–46.0)
Hemoglobin: 11.2 g/dL — ABNORMAL LOW (ref 12.0–15.0)
Immature Granulocytes: 1 %
Lymphocytes Relative: 12 %
Lymphs Abs: 1.8 10*3/uL (ref 0.7–4.0)
MCH: 29.2 pg (ref 26.0–34.0)
MCHC: 31.5 g/dL (ref 30.0–36.0)
MCV: 92.4 fL (ref 80.0–100.0)
Monocytes Absolute: 1 10*3/uL (ref 0.1–1.0)
Monocytes Relative: 6 %
Neutro Abs: 12.4 10*3/uL — ABNORMAL HIGH (ref 1.7–7.7)
Neutrophils Relative %: 81 %
Platelets: 242 10*3/uL (ref 150–400)
RBC: 3.84 MIL/uL — ABNORMAL LOW (ref 3.87–5.11)
RDW: 14.8 % (ref 11.5–15.5)
WBC: 15.3 10*3/uL — ABNORMAL HIGH (ref 4.0–10.5)
nRBC: 0 % (ref 0.0–0.2)

## 2023-09-29 LAB — ECHOCARDIOGRAM COMPLETE
Area-P 1/2: 5.84 cm2
Calc EF: 46.5 %
MV VTI: 2.51 cm2
S' Lateral: 2.2 cm
Single Plane A2C EF: 43.8 %
Single Plane A4C EF: 47.2 %

## 2023-09-29 LAB — PROCALCITONIN: Procalcitonin: 0.77 ng/mL

## 2023-09-29 LAB — URINALYSIS, W/ REFLEX TO CULTURE (INFECTION SUSPECTED)
Bilirubin Urine: NEGATIVE
Glucose, UA: NEGATIVE mg/dL
Hgb urine dipstick: NEGATIVE
Ketones, ur: NEGATIVE mg/dL
Nitrite: NEGATIVE
Protein, ur: 100 mg/dL — AB
Specific Gravity, Urine: 1.019 (ref 1.005–1.030)
pH: 5 (ref 5.0–8.0)

## 2023-09-29 LAB — RESP PANEL BY RT-PCR (RSV, FLU A&B, COVID)  RVPGX2
Influenza A by PCR: NEGATIVE
Influenza B by PCR: NEGATIVE
Resp Syncytial Virus by PCR: NEGATIVE
SARS Coronavirus 2 by RT PCR: NEGATIVE

## 2023-09-29 LAB — MRSA NEXT GEN BY PCR, NASAL: MRSA by PCR Next Gen: NOT DETECTED

## 2023-09-29 LAB — LACTIC ACID, PLASMA
Lactic Acid, Venous: 2 mmol/L (ref 0.5–1.9)
Lactic Acid, Venous: 2 mmol/L (ref 0.5–1.9)

## 2023-09-29 LAB — PROTIME-INR
INR: 1.3 — ABNORMAL HIGH (ref 0.8–1.2)
Prothrombin Time: 16.7 s — ABNORMAL HIGH (ref 11.4–15.2)

## 2023-09-29 LAB — BRAIN NATRIURETIC PEPTIDE: B Natriuretic Peptide: 824.1 pg/mL — ABNORMAL HIGH (ref 0.0–100.0)

## 2023-09-29 LAB — STREP PNEUMONIAE URINARY ANTIGEN: Strep Pneumo Urinary Antigen: NEGATIVE

## 2023-09-29 MED ORDER — METAMUCIL PO WAFR: 2.0000 | WAFER | Freq: Every day | ORAL | Status: DC

## 2023-09-29 MED ORDER — SODIUM CHLORIDE 0.9 % IV SOLN
2.0000 g | Freq: Once | INTRAVENOUS | Status: AC
  Administered 2023-09-29: 2 g via INTRAVENOUS
  Filled 2023-09-29: qty 20

## 2023-09-29 MED ORDER — ACETAMINOPHEN 650 MG RE SUPP: 650.0000 mg | Freq: Four times a day (QID) | RECTAL | Status: DC | PRN

## 2023-09-29 MED ORDER — LACTATED RINGERS IV BOLUS
1000.0000 mL | Freq: Once | INTRAVENOUS | Status: AC
  Administered 2023-09-29: 1000 mL via INTRAVENOUS

## 2023-09-29 MED ORDER — ONDANSETRON HCL 4 MG/2ML IJ SOLN
4.0000 mg | Freq: Four times a day (QID) | INTRAMUSCULAR | Status: DC | PRN
  Administered 2023-09-30: 4 mg via INTRAVENOUS
  Filled 2023-09-29: qty 2

## 2023-09-29 MED ORDER — MIRTAZAPINE 15 MG PO TABS
30.0000 mg | ORAL_TABLET | Freq: Every day | ORAL | Status: DC
  Administered 2023-09-29 – 2023-10-01 (×2): 30 mg via ORAL
  Filled 2023-09-29 (×4): qty 2

## 2023-09-29 MED ORDER — IPRATROPIUM-ALBUTEROL 0.5-2.5 (3) MG/3ML IN SOLN
3.0000 mL | Freq: Once | RESPIRATORY_TRACT | Status: AC
  Administered 2023-09-29: 3 mL via RESPIRATORY_TRACT
  Filled 2023-09-29: qty 3

## 2023-09-29 MED ORDER — APIXABAN 5 MG PO TABS
5.0000 mg | ORAL_TABLET | Freq: Two times a day (BID) | ORAL | Status: DC
  Administered 2023-09-29 – 2023-10-02 (×7): 5 mg via ORAL
  Filled 2023-09-29 (×9): qty 1

## 2023-09-29 MED ORDER — AMIODARONE HCL 200 MG PO TABS
100.0000 mg | ORAL_TABLET | Freq: Every morning | ORAL | Status: DC
  Administered 2023-09-29 – 2023-10-02 (×4): 100 mg via ORAL
  Filled 2023-09-29 (×4): qty 1

## 2023-09-29 MED ORDER — ACETAMINOPHEN 325 MG PO TABS: 650.0000 mg | ORAL_TABLET | Freq: Four times a day (QID) | ORAL | Status: DC | PRN

## 2023-09-29 MED ORDER — GUAIFENESIN ER 600 MG PO TB12
600.0000 mg | ORAL_TABLET | Freq: Two times a day (BID) | ORAL | Status: DC
  Administered 2023-09-29 – 2023-10-02 (×5): 600 mg via ORAL
  Filled 2023-09-29 (×7): qty 1

## 2023-09-29 MED ORDER — ESCITALOPRAM OXALATE 20 MG PO TABS
20.0000 mg | ORAL_TABLET | Freq: Every day | ORAL | Status: DC
  Administered 2023-09-29 – 2023-10-02 (×3): 20 mg via ORAL
  Filled 2023-09-29 (×3): qty 1

## 2023-09-29 MED ORDER — PRAVASTATIN SODIUM 40 MG PO TABS
40.0000 mg | ORAL_TABLET | Freq: Every day | ORAL | Status: DC
  Administered 2023-09-29 – 2023-10-01 (×2): 40 mg via ORAL
  Filled 2023-09-29 (×2): qty 1

## 2023-09-29 MED ORDER — ONDANSETRON HCL 4 MG PO TABS: 4.0000 mg | ORAL_TABLET | Freq: Four times a day (QID) | ORAL | Status: DC | PRN

## 2023-09-29 MED ORDER — ACETAMINOPHEN 650 MG RE SUPP
RECTAL | Status: AC
  Administered 2023-09-29: 650 mg via RECTAL
  Filled 2023-09-29: qty 1

## 2023-09-29 MED ORDER — ALPRAZOLAM 0.25 MG PO TABS
0.2500 mg | ORAL_TABLET | Freq: Every day | ORAL | Status: DC
  Administered 2023-09-29 – 2023-10-01 (×2): 0.25 mg via ORAL
  Filled 2023-09-29 (×4): qty 1

## 2023-09-29 MED ORDER — AZITHROMYCIN 500 MG IV SOLR
500.0000 mg | Freq: Once | INTRAVENOUS | Status: AC
  Administered 2023-09-29: 500 mg via INTRAVENOUS
  Filled 2023-09-29: qty 5

## 2023-09-29 MED ORDER — LACTASE 3000 UNITS PO TABS
9000.0000 [IU] | ORAL_TABLET | Freq: Three times a day (TID) | ORAL | Status: DC
  Administered 2023-09-29 – 2023-10-02 (×8): 9000 [IU] via ORAL
  Filled 2023-09-29 (×13): qty 3

## 2023-09-29 MED ORDER — SACCHAROMYCES BOULARDII 250 MG PO CAPS
250.0000 mg | ORAL_CAPSULE | Freq: Two times a day (BID) | ORAL | Status: DC
  Administered 2023-09-29 – 2023-10-02 (×4): 250 mg via ORAL
  Filled 2023-09-29 (×5): qty 1

## 2023-09-29 MED ORDER — PSYLLIUM 95 % PO PACK
1.0000 | PACK | Freq: Every day | ORAL | Status: DC
  Administered 2023-09-29 – 2023-10-02 (×3): 1 via ORAL
  Filled 2023-09-29 (×5): qty 1

## 2023-09-29 MED ORDER — PREDNISONE 20 MG PO TABS
40.0000 mg | ORAL_TABLET | Freq: Every day | ORAL | Status: DC
  Administered 2023-09-30 – 2023-10-02 (×3): 40 mg via ORAL
  Filled 2023-09-29 (×3): qty 2

## 2023-09-29 MED ORDER — SODIUM CHLORIDE 0.9% FLUSH
3.0000 mL | Freq: Two times a day (BID) | INTRAVENOUS | Status: DC
  Administered 2023-09-29 – 2023-10-02 (×7): 3 mL via INTRAVENOUS

## 2023-09-29 MED ORDER — LACTATED RINGERS IV SOLN: INTRAVENOUS | Status: DC

## 2023-09-29 MED ORDER — ALBUTEROL SULFATE (2.5 MG/3ML) 0.083% IN NEBU
2.5000 mg | INHALATION_SOLUTION | RESPIRATORY_TRACT | Status: DC | PRN
  Administered 2023-09-29: 2.5 mg via RESPIRATORY_TRACT
  Filled 2023-09-29 (×2): qty 3

## 2023-09-29 MED ORDER — NITROFURANTOIN MONOHYD MACRO 100 MG PO CAPS
100.0000 mg | ORAL_CAPSULE | Freq: Every day | ORAL | Status: DC
  Administered 2023-09-29 – 2023-10-01 (×2): 100 mg via ORAL
  Filled 2023-09-29 (×4): qty 1

## 2023-09-29 MED ORDER — ACETAMINOPHEN 650 MG RE SUPP: 650.0000 mg | Freq: Once | RECTAL | Status: AC

## 2023-09-29 MED ORDER — SODIUM CHLORIDE 0.9 % IV BOLUS
1000.0000 mL | Freq: Once | INTRAVENOUS | Status: AC
  Administered 2023-09-29: 1000 mL via INTRAVENOUS

## 2023-09-29 MED ORDER — MIDODRINE HCL 5 MG PO TABS: 5.0000 mg | ORAL_TABLET | Freq: Two times a day (BID) | ORAL | Status: DC | PRN

## 2023-09-29 MED ORDER — SODIUM CHLORIDE 0.9 % IV SOLN
2.0000 g | INTRAVENOUS | Status: DC
  Administered 2023-09-30 – 2023-10-02 (×3): 2 g via INTRAVENOUS
  Filled 2023-09-29 (×4): qty 20

## 2023-09-29 MED ORDER — SODIUM CHLORIDE 0.9 % IV BOLUS: 1000.0000 mL | Freq: Once | INTRAVENOUS | Status: DC

## 2023-09-29 NOTE — Sepsis Progress Note (Signed)
 Per MD additional fluids held d/t pt being (?)volume overloaded

## 2023-09-29 NOTE — Sepsis Progress Note (Signed)
 Notified provider and bedside nurse of need to order and draw repeat lactic acid #3 at 1245, fluid needs are 2007 cc(pt receiving 2250 cc).

## 2023-09-29 NOTE — Progress Notes (Signed)
 Video visit with mom

## 2023-09-29 NOTE — ED Notes (Signed)
 X-ray at bedside

## 2023-09-29 NOTE — ED Notes (Signed)
 NT put pt on bedpan to get an urine sample pt could not urinate, NT bladder scanned pt pt bladder was holding of urine

## 2023-09-29 NOTE — Progress Notes (Signed)
 Patient taken off Bipap and placed on [place] 4L. No increased WOB at this time. Patient states her breathing feels comfortable. Family at bedside. RN notifed.

## 2023-09-29 NOTE — Progress Notes (Signed)
   09/29/23 1954  BiPAP/CPAP/SIPAP  BiPAP/CPAP/SIPAP Pt Type Adult  Reason BIPAP/CPAP not in use Non-compliant (pt wants to try without and see how she does. Pt willl let RT know if she needs it.)  BiPAP/CPAP /SiPAP Vitals  Pulse Rate 84  Resp (!) 21  SpO2 95 %  Bilateral Breath Sounds Rhonchi  MEWS Score/Color  MEWS Score 1  MEWS Score Color Green

## 2023-09-29 NOTE — ED Notes (Signed)
 RT at bedside.

## 2023-09-29 NOTE — Sepsis Progress Note (Signed)
 Elink monitoring for the code sepsis protocol.

## 2023-09-29 NOTE — ED Provider Notes (Signed)
 [place] EMERGENCY DEPARTMENT AT [facility] Provider Note   CSN: [id number] Arrival date & time: 09/29/23  0710     History  Chief Complaint  Patient presents with   Respiratory Distress    [name] is a 89 y.o. female.  89 year old female past medical history of atrial fibrillation and hypertension presenting to the emergency department today with cough and shortness of breath.  The patient has been treated for bronchitis as an outpatient with antibiotics and steroids.  She apparently was getting worse last night.  She was sent to the ER for further evaluation due to worsening symptoms.  Her pulse ox was apparently in the 70s this morning when medics arrived.  The patient was initially given some DuoNebs and placed on facemask and nasal cannula oxygen  with improvement to the 90s systolic.  She is placed on BiPAP on arrival here.  The patient states she is feeling better now that she is on BiPAP.  She has been having a productive cough with some thick mucus as well as some blood-tinged sputum.        Home Medications Prior to Admission medications   Medication Sig Start Date End Date Taking? Authorizing Provider  acetaminophen  (TYLENOL ) 500 MG tablet Take 500 mg by mouth in the morning, at noon, and at bedtime.    [provider]  ALPRAZolam  (XANAX ) 0.25 MG tablet Take 1 tablet (0.25 mg total) by mouth at bedtime. 05/13/23   Raylene Calamity, NP  amiodarone  (PACERONE ) 100 MG tablet Take 100 mg by mouth every morning.    [provider]  CALCIUM  CARBONATE-VIT D-MIN PO Take 2 tablets by mouth 3 (three) times a week.     [provider]  CRANBERRY PO Take 450 mg by mouth daily.    [provider]  diclofenac Sodium (VOLTAREN ARTHRITIS PAIN) 1 % GEL Apply topically 2 (two) times daily as needed (1 oz).    [provider]  docusate sodium (COLACE) 100 MG capsule Take 100 mg by mouth daily as needed.    [provider]   doxycycline  (VIBRA -TABS) 100 MG tablet Take 1 tablet (100 mg total) by mouth 2 (two) times daily for 7 days. 09/27/23 10/04/23  Arnetha Bhat, NP  ELIQUIS  5 MG TABS tablet TAKE 1 TABLET BY MOUTH TWICE DAILY 06/14/22   [place], [name], MD  escitalopram  (LEXAPRO ) 20 MG tablet Take 20 mg by mouth daily. In the morning 8-11 am    [provider]  estradiol  (ESTRACE ) 0.1 MG/GM vaginal cream Place 1 Applicatorful vaginally 3 (three) times a week. 08/22/20   [provider]  glucosamine-chondroitin 500-400 MG tablet Take 1 tablet by mouth in the morning and at bedtime.    [provider]  guaiFENesin  (MUCINEX ) 600 MG 12 hr tablet Take 1 tablet (600 mg total) by mouth 2 (two) times daily for 5 days. 09/28/23 09/28/2023  [place], Amy E, NP  ipratropium-albuterol (DUONEB) 0.5-2.5 (3) MG/3ML SOLN Take 3 mLs by nebulization 3 (three) times daily for 3 days. 09/28/23 10/01/23  Arnetha Bhat, NP  Lactase 9000 units TABS Take 1 tablet by mouth 3 (three) times daily. 7:30 am, 11 am, and 5 pm    [provider]  lidocaine  4 % Place 1 patch onto the skin daily as needed (for back/shoulder pain).    [provider]  loperamide  (IMODIUM  A-D) 2 MG tablet Take 2 mg by mouth as needed for diarrhea or loose stools.  [provider]  midodrine  (PROAMATINE ) 5 MG tablet Take 5 mg by mouth 2 (two) times daily as needed. Hold if SBP is >110    [provider]  mirtazapine  (REMERON ) 30 MG tablet Take 1 tablet (30 mg total) by mouth at bedtime. 12/09/22   Raylene Calamity, NP  nitrofurantoin , macrocrystal-monohydrate, (MACROBID ) 100 MG capsule Take 100 mg by mouth at bedtime. 05/29/20   [provider]  pravastatin  (PRAVACHOL ) 40 MG tablet TAKE 1 TABLET(40 MG) BY MOUTH DAILY 08/30/22   Marguerite Shiley, MD  Probiotic Product (PROBIOTIC DIGESTIVE SUPP PO) Take 1 capsule by mouth once.    [provider]  Psyllium (METAMUCIL) WAFR Take 2 Wafers by mouth daily.     [provider]      Allergies    Augmentin  [amoxicillin -pot clavulanate], Lactose, Lisinopril, Other, and Bactrim [sulfamethoxazole-trimethoprim]    Review of Systems   Review of Systems  Respiratory:  Positive for cough and shortness of breath.   All other systems reviewed and are negative.   Physical Exam Updated Vital Signs BP (!) 147/74   Pulse (!) 124   Temp (!) 101.4 F (38.6 C) (Rectal)   Resp (!) 22   SpO2 99%  Physical Exam Vitals and nursing note reviewed.   Gen: Mildly tachypneic on BiPAP Eyes: PERRL, EOMI HEENT: no oropharyngeal swelling Neck: trachea midline Resp: Diminished at bilateral lung bases with scattered rhonchi noted Card: Tachycardic, no murmurs, rubs, or gallops Abd: nontender, nondistended Extremities: no calf tenderness, no edema Vascular: 2+ radial pulses bilaterally, 2+ DP pulses bilaterally Skin: no rashes Psyc: acting appropriately   ED Results / Procedures / Treatments   Labs (all labs ordered are listed, but only abnormal results are displayed) Labs Reviewed  COMPREHENSIVE METABOLIC PANEL WITH GFR - Abnormal; Notable for the following components:      Result Value   Potassium 3.1 (*)    CO2 21 (*)    Glucose, Bld 149 (*)    BUN 27 (*)    Calcium  8.2 (*)    Albumin 2.8 (*)    GFR, Estimated 53 (*)    All other components within normal limits  CBC WITH DIFFERENTIAL/PLATELET - Abnormal; Notable for the following components:   WBC 15.3 (*)    RBC 3.84 (*)    Hemoglobin 11.2 (*)    HCT 35.5 (*)    Neutro Abs 12.4 (*)    Abs Immature Granulocytes 0.10 (*)    All other components within normal limits  PROTIME-INR - Abnormal; Notable for the following components:   Prothrombin Time 16.7 (*)    INR 1.3 (*)    All other components within normal limits  URINALYSIS, W/ REFLEX TO CULTURE (INFECTION SUSPECTED) - Abnormal; Notable for the following components:   APPearance HAZY (*)    Protein, ur 100 (*)    Leukocytes,Ua  TRACE (*)    Bacteria, UA RARE (*)    All other components within normal limits  I-STAT CG4 LACTIC ACID, ED - Abnormal; Notable for the following components:   Lactic Acid, Venous 2.9 (*)    All other components within normal limits  RESP PANEL BY RT-PCR (RSV, FLU A&B, COVID)  RVPGX2  CULTURE, BLOOD (ROUTINE X 2)  CULTURE, BLOOD (ROUTINE X 2)  URINE CULTURE  I-STAT CG4 LACTIC ACID, ED    EKG EKG Interpretation Date/Time:  Thursday September 29 2023 07:42:34 EDT Ventricular Rate:  122 PR Interval:  181 QRS Duration:  102 QT Interval:  306 QTC Calculation: 436 R Axis:   -52  Text Interpretation: Sinus tachycardia Atrial premature complex Left anterior fascicular block Abnormal R-wave progression, early transition Probable LVH with secondary repol abnrm Anterior Q waves, possibly due to LVH Confirmed by Abner Hoffman [phone]) on 09/29/2023 8:54:58 AM  Radiology DG Chest Port 1 View Result Date: 09/29/2023 CLINICAL DATA:  Questionable sepsis. EXAM: PORTABLE CHEST 1 VIEW COMPARISON:  Portable chest 01/19/2022 FINDINGS: There is widespread patchy airspace disease on the right-greater-than-left, relatively sparing the apical left lung. Findings are most likely due to multilobar pneumonia in this clinical setting. Small pleural effusions are beginning to form. There is mild cardiomegaly with normal caliber of the central vessels. There is aortic atherosclerosis with stable mediastinum. Interstitial thickening underlies some of the opacities and could be infectious or due to edema. There are degenerative changes of the spine shoulders with chronic rotator cuff arthropathy. IMPRESSION: 1. Widespread patchy airspace disease on the right-greater-than-left, most likely due to multilobar pneumonia in this clinical setting. 2. Small pleural effusions are beginning to form. 3. Interstitial thickening underlies some of the opacities and could be infectious or due to edema. 4. Aortic atherosclerosis. Electronically  Signed   By: Denman Fischer M.D.   On: 09/29/2023 07:46    Procedures Procedures    Medications Ordered in ED Medications  lactated ringers  infusion ( Intravenous New Bag/Given 09/29/23 0812)  azithromycin (ZITHROMAX) 500 mg in sodium chloride  0.9 % 250 mL IVPB (500 mg Intravenous New Bag/Given 09/29/23 0815)  cefTRIAXone  (ROCEPHIN ) 2 g in sodium chloride  0.9 % 100 mL IVPB (0 g Intravenous Stopped 09/29/23 0846)  ipratropium-albuterol (DUONEB) 0.5-2.5 (3) MG/3ML nebulizer solution 3 mL (3 mLs Nebulization Given 09/29/23 0733)  acetaminophen  (TYLENOL ) suppository 650 mg (650 mg Rectal Given 09/29/23 0808)  lactated ringers  bolus 1,000 mL (1,000 mLs Intravenous New Bag/Given 09/29/23 6213)    ED Course/ Medical Decision Making/ A&P                                 Medical Decision Making 89 year old female with past medical history of hypertension and atrial fibrillation on anticoagulation presenting to the emergency department today with productive cough and shortness of breath.  The patient is tachycardic here on arrival.  I will further evaluate her here with a sepsis workup.  Will cover her with Rocephin  and azithromycin.  She does have an Augmentin  allergy but it does appear that she has received Rocephin  in the past.  The patient's initial blood pressures in the 160s.  Will hold off on empiric fluids at this time given he age and to see what her chest x-ray looks like to make sure she does not have any pulmonary edema.  She does look relatively comfortable on BiPAP.  Will continue this and give her a DuoNeb here as well.  I will reevaluate for ultimate disposition.  The patient does have leukocytosis.  Her lactate is mildly elevated but does not meet criteria for septic shock.  The patient is given 1 L of IV fluids.  On reassessment she remained stable on BiPAP.  She is given rectal Tylenol  for her fever.  Calls placed to the hospitalist service for admission.  CRITICAL CARE Performed by:  Carin Charleston   Total critical care time: 40 minutes  Critical care time was exclusive of separately billable procedures and treating other patients.  Critical care was necessary to treat or prevent imminent or  life-threatening deterioration.  Critical care was time spent personally by me on the following activities: development of treatment plan with patient and/or surrogate as well as nursing, discussions with consultants, evaluation of patient's response to treatment, examination of patient, obtaining history from patient or surrogate, ordering and performing treatments and interventions, ordering and review of laboratory studies, ordering and review of radiographic studies, pulse oximetry and re-evaluation of patient's condition.   Amount and/or Complexity of Data Reviewed Labs: ordered. Radiology: ordered.  Risk OTC drugs. Prescription drug management. Decision regarding hospitalization.           Final Clinical Impression(s) / ED Diagnoses Final diagnoses:  Pneumonia due to infectious organism, unspecified laterality, unspecified part of lung  Acute hypoxemic respiratory failure (HCC)  Sepsis, due to unspecified organism, unspecified whether acute organ dysfunction present [facility])    Rx / DC Orders ED Discharge Orders     None         Carin Charleston, MD 09/29/23 [phone]

## 2023-09-29 NOTE — ED Triage Notes (Signed)
 Pt BIB [facility] from [facility] d/t resp distress. She began Tx for bronchitis a couple days ago with Doxy, steroids & Nebs. Last night she began having worsening Resp Distress & upon EMS arrival she was sounding girggly had thick blood tinged mucus, decreased bil LS, 75% on RA & was 80% on 6L via n/c for facility & during Neb Tx with EMS she was also wearing n/c & was 92%. Pt reports she was febrile yesterday at 103, does feel warm to the touch today. Tachy @ 112 bpm, 162/78, Cap 23. No reported Hx of cardiac issues, abd does appear distended. Does have DNR, A/Ox4, EMS gave 2g Mag, 125 Solu-Medrol, 2 Duo-nebs & 250cc NS via 20g Lt FA PIV.

## 2023-09-29 NOTE — Sepsis Progress Note (Signed)
 Notified bedside nurse of need to draw repeat lactic acid.

## 2023-09-29 NOTE — H&P (Signed)
 History and Physical    Patient: [name]:[id number] DOB: 1930/10/24 DOA: 09/29/2023 DOS: the patient was seen and examined on 09/29/2023 PCP: Marguerite Shiley, MD  Patient coming from: [facility] via EMS  Chief Complaint:  Chief Complaint  Patient presents with   Respiratory Distress   HPI: [name] is a 89 y.o. female with medical history significant of hypertension, paroxysmal atrial fibrillation, chronic cystitis presented with complaints of coughing up blood and shortness of breath. She is accompanied by [name] family.  She began experiencing hemoptysis suddenly yesterday. There was no associated chest pain, nausea, or vomiting. She denies any recent changes in [name] ability to lay flat or sleep at night, although she felt unwell for the last couple of days.  Records noted she had been treated for bronchitis at the nursing facility with doxycycline , steroids, and breathing treatments.  It was reported that she had fevers up to 103 F and was tachycardic.    [name] makes note that last night she had increasing respiratory distress and was noted to have O2 saturations in the 70s for which she was placed on 6 L nasal cannula oxygen .  Normally patient has 2 L of oxygen  to use as needed.  En route with EMS patient had been given 2 g of magnesium , Solu-Medrol 125 mg IV, 2 DuoNeb breathing treatments, and 250 cc of normal saline IV fluids prior to arrival.  [name] reports that she was confused and 'not making any sense' when she spoke on the phone yesterday.  Normal the patient is alert and oriented to person and place, but does get confused with time.  She confirms feeling 'really bad' yesterday and continues to feel the same today.  [name] adds that she has a history of atrial fibrillation, which has been stable recently. She has been cardioverted in the past and is followed by a cardiologist and an AFib clinic. She also has chronic cephalosporin-resistant urinary tract  infections, with [name] urine frequently showing bacteria.  In the emergency department patient was noted to be febrile up to 101.4 F with tachycardia and tachypnea meeting SIRS criteria.  Patient was transition to BiPAP with improvement in work of breathing.  Labs were significant for WBC 15.3, hemoglobin 11.2, potassium 3.1, BUN 27, creatinine 1, and lactic acid 2.9.  Chest x-ray noted widespread patchy airspace disease in the right greater than the left concerning for multilobar pneumonia in the clinical setting with small pleural effusions and interstitial thickening concerning for infection versus edema.  Review of Systems: As mentioned in the history of present illness. All other systems reviewed and are negative. Past Medical History:  Diagnosis Date   Allergic rhinitis    Arthritis    hands, little in my feet (06/22/2016)   Candidiasis of vulva and vagina    Cystocele, midline    Disorder of bone and cartilage, unspecified    Fibroids    Hypertension    Large hiatal hernia 06/23/2016   Migraine    none in the last few years (06/22/2016)   Osteopenia    Other chronic cystitis    Other sign and symptom in breast    Paroxysmal atrial fibrillation (HCC) 11/30/2012   Pneumonia    I've had walking pneumonia a couple times  (06/22/2016)   Pneumonia dx'd 06/15/2016   Rectal incontinence    Stroke (HCC) 2014   hx of mini stroke    TIA (transient ischemic attack) 2015   Past Surgical History:  Procedure Laterality Date   CARDIOVERSION N/A 06/29/2016   Procedure: CARDIOVERSION;  Surgeon: Hugh Madura, MD;  Location: [facility] OR;  Service: Cardiovascular;  Laterality: N/A;   CATARACT EXTRACTION W/ INTRAOCULAR LENS IMPLANT Left    DILATION AND CURETTAGE OF UTERUS     hiatel hernia  08/20/2016   HYSTEROSCOPY WITH D & C  03/16/1999   INSERTION OF MESH N/A 08/20/2016   Procedure: INSERTION OF MESH;  Surgeon: Shela Derby, MD;  Location: [facility] ORS;  Service: General;  Laterality: N/A;   TEE  WITHOUT CARDIOVERSION N/A 11/06/2012   Procedure: TRANSESOPHAGEAL ECHOCARDIOGRAM (TEE);  Surgeon: Lenise Quince, MD;  Location: [facility] ENDOSCOPY;  Service: Cardiovascular;  Laterality: N/A;   TONSILLECTOMY  1937   VAGINAL HYSTERECTOMY  09/2005   Anterior repair; Removal of urethral caruncle./notes 09/02/2015   Social History:  reports that she has never smoked. She has never used smokeless tobacco. She reports current alcohol  use of about 1.0 standard drink of alcohol  per week. She reports that she does not use drugs.  Allergies  Allergen Reactions   Augmentin  [Amoxicillin -Pot Clavulanate]    Lactose Other (See Comments)    Dairy Sensitivity  Dairy Sensitivity     Lisinopril Other (See Comments)    DIZZY   Other     SEASONAL ALLERGIES   Bactrim [Sulfamethoxazole-Trimethoprim] Rash    Family History  Problem Relation Age of Onset   Cancer Mother    Hypertension Mother    Heart disease Father    Heart disease Brother    Emphysema Brother     Prior to Admission medications   Medication Sig Start Date End Date Taking? Authorizing Provider  acetaminophen  (TYLENOL ) 500 MG tablet Take 500 mg by mouth in the morning, at noon, and at bedtime.    [provider]  ALPRAZolam  (XANAX ) 0.25 MG tablet Take 1 tablet (0.25 mg total) by mouth at bedtime. 05/13/23   Raylene Calamity, NP  amiodarone  (PACERONE ) 100 MG tablet Take 100 mg by mouth every morning.    [provider]  CALCIUM  CARBONATE-VIT D-MIN PO Take 2 tablets by mouth 3 (three) times a week.     [provider]  CRANBERRY PO Take 450 mg by mouth daily.    [provider]  diclofenac Sodium (VOLTAREN ARTHRITIS PAIN) 1 % GEL Apply topically 2 (two) times daily as needed (1 oz).    [provider]  docusate sodium (COLACE) 100 MG capsule Take 100 mg by mouth daily as needed.    [provider]  doxycycline  (VIBRA -TABS) 100 MG tablet Take 1 tablet (100 mg total) by mouth 2 (two) times  daily for 7 days. 09/27/23 10/04/23  Arnetha Bhat, NP  ELIQUIS  5 MG TABS tablet TAKE 1 TABLET BY MOUTH TWICE DAILY 06/14/22   [place], [name], MD  escitalopram  (LEXAPRO ) 20 MG tablet Take 20 mg by mouth daily. In the morning 8-11 am    [provider]  estradiol  (ESTRACE ) 0.1 MG/GM vaginal cream Place 1 Applicatorful vaginally 3 (three) times a week. 08/22/20   [provider]  glucosamine-chondroitin 500-400 MG tablet Take 1 tablet by mouth in the morning and at bedtime.    [provider]  guaiFENesin  (MUCINEX ) 600 MG 12 hr tablet Take 1 tablet (600 mg total) by mouth 2 (two) times daily for 5 days. 09/28/23 10/01/2023  [place], Amy E, NP  ipratropium-albuterol (DUONEB) 0.5-2.5 (3) MG/3ML SOLN Take 3 mLs by nebulization 3 (three) times daily for 3 days.  09/28/23 10/01/23  Arnetha Bhat, NP  Lactase 9000 units TABS Take 1 tablet by mouth 3 (three) times daily. 7:30 am, 11 am, and 5 pm    [provider]  lidocaine  4 % Place 1 patch onto the skin daily as needed (for back/shoulder pain).    [provider]  loperamide  (IMODIUM  A-D) 2 MG tablet Take 2 mg by mouth as needed for diarrhea or loose stools.    [provider]  midodrine  (PROAMATINE ) 5 MG tablet Take 5 mg by mouth 2 (two) times daily as needed. Hold if SBP is >110    [provider]  mirtazapine  (REMERON ) 30 MG tablet Take 1 tablet (30 mg total) by mouth at bedtime. 12/09/22   Raylene Calamity, NP  nitrofurantoin , macrocrystal-monohydrate, (MACROBID ) 100 MG capsule Take 100 mg by mouth at bedtime. 05/29/20   [provider]  pravastatin  (PRAVACHOL ) 40 MG tablet TAKE 1 TABLET(40 MG) BY MOUTH DAILY 08/30/22   Gupta, Anjali L, MD  Probiotic Product (PROBIOTIC DIGESTIVE SUPP PO) Take 1 capsule by mouth once.    [provider]  Psyllium (METAMUCIL) WAFR Take 2 Wafers by mouth daily.    [provider]    Physical Exam: Vitals:   09/29/23 0730 09/29/23 0739 09/29/23 0745  09/29/23 0900  BP: (!) 160/62  (!) 147/74 (!) 143/60  Pulse: (!) 116  (!) 124 (!) 106  Resp: (!) 22  (!) 22 (!) 22  Temp:  (!) 101.4 F (38.6 C)    TempSrc:  Rectal    SpO2: 97%  99% 100%    Constitutional: Elderly female who appears to be ill but able to follow commands Eyes: PERRL, lids and conjunctivae normal ENMT: Mucous membranes are dry.  Normal dentition.  Neck: normal, supple  Respiratory: clear to auscultation bilaterally, no wheezing, no crackles. Normal respiratory effort. No accessory muscle use.  Cardiovascular: Regular rate and rhythm, no murmurs / rubs / gallops. No extremity edema. 2+ pedal pulses. No carotid bruits.  Abdomen: no tenderness, no masses palpated. No hepatosplenomegaly. Bowel sounds positive.  Musculoskeletal: no clubbing / cyanosis. No joint deformity upper and lower extremities. Good ROM, no contractures. Normal muscle tone.  Skin: no rashes, lesions, ulcers. No induration Neurologic: CN 2-12 grossly intact. Sensation intact, DTR normal. Strength 5/5 in all 4.  Psychiatric: Normal judgment and insight. Alert and oriented x 3. Normal mood.   Data Reviewed:  EKG reveals sinus tachycardia 122 bpm.  Reviewed labs, imaging, and pertinent records as documented.  Assessment and Plan:  Acute on chronic respiratory failure with hypoxia Severe sepsis secondary to pneumonia Patient presented with worsening respiratory distress after being recently treated for bronchitis with prednisone and doxycycline .  Hypoxia with O2 saturations reported to be in the 70s for which she was placed on BiPAP temporarily prior to being transitioned to 4 L nasal cannula oxygen .  Patient was noted to be febrile up to 101 F with tachycardia and tachypnea meeting SIRS criteria.  Chest x-ray noted widespread patchy airspace disease on the right greater than left thought to represent a multi lobar pneumonia with small pleural effusions beginning to form and interstitial thickening some  opacities concerning for possible edema.  Lactic acid levels were noted to trend upwards to 2.9-> 5.3.  Patient had treated with Solu-Medrol IV, 2 g of magnesium  sulfate, and breathing treatments prior to arrival.  Patient had not initially given bolus due to concerns for possibly being fluid overloaded. - Admit to a progressive bed -  Aspiration precautions with elevation head of the bed - Continuous pulse oximetry with oxygen  to maintain O2 saturations greater than 92% - Incentive spirometry and flutter valve - Check procalcitonin(0.77) - Follow-up blood cultures - Check CT scan of the chest - Continue empiric antibiotics of Rocephin  and azithromycin - Mucinex  - Continue to trend lactic acid levels  Acute metabolic encephalopathy Prior to arrival patient was altered yesterday for which family noted that she was not making any sense but normally alert and oriented to person and place but not always time.  Patient appears to be more alert and back to baseline per family at this time. - Delirium precautions  Chronic cystitis Urinalysis noted trace leukocytes, rare bacteria, and 11-20 WBCs.  Patient noted to chronically have bacteriuria for which she is on Macrobid .  She has had previously cephalosporin resistant UTIs. - Follow-up urine culture - Continue Macrobid   Paroxysmal atrial fibrillation on chronic anticoagulation, Patient appears to be in sinus rhythm at this time heart rates relatively controlled. - Continue amiodarone  and Eliquis   Heart failure with mildly reduced ejection fraction Patient does not appear grossly fluid overloaded on physical exam.  Last echocardiogram noted EF to be approximately 50% with indeterminate diastolic parameters when checked back in 01/2022.  BNP noted to be elevated at 824.1.  Question if patient symptoms were possibly secondary to [name] being acutely fluid overloaded. - Strict I&O's and daily weights  Orthostatic hypotension Chronic.  At baseline  patient has a history of multiple syncopal episodes and near syncope  - Continue midodrine  as needed  Anxiety - Continue Lexapro  and Xanax   Hyperlipidemia - Continue atorvastatin   DVT prophylaxis: Eliquis  Advance Care Planning:   Code Status: Do not attempt resuscitation (DNR) PRE-ARREST INTERVENTIONS DESIRED    Consults: None  Family Communication: Spencer and Spencer updated at bedside  Severity of Illness: The appropriate patient status for this patient is INPATIENT. Inpatient status is judged to be reasonable and necessary in order to provide the required intensity of service to ensure the patient's safety. The patient's presenting symptoms, physical exam findings, and initial radiographic and laboratory data in the context of their chronic comorbidities is felt to place them at high risk for further clinical deterioration. Furthermore, it is not anticipated that the patient will be medically stable for discharge from the hospital within 2 midnights of admission.   * I certify that at the point of admission it is my clinical judgment that the patient will require inpatient hospital care spanning beyond 2 midnights from the point of admission due to high intensity of service, high risk for further deterioration and high frequency of surveillance required.*  Author: Lena Qualia, MD 09/29/2023 9:12 AM  For on call review [email or web address].

## 2023-09-29 NOTE — Progress Notes (Signed)
 Patient arrived at the unit from ED, upon arrival pt is alert and oriented X4,CHG bath given,CCMD notified, vitals taken,pt oriented to the unit and call bell in reach

## 2023-09-30 ENCOUNTER — Inpatient Hospital Stay (HOSPITAL_COMMUNITY)

## 2023-09-30 DIAGNOSIS — A419 Sepsis, unspecified organism: Secondary | ICD-10-CM | POA: Diagnosis not present

## 2023-09-30 DIAGNOSIS — J189 Pneumonia, unspecified organism: Secondary | ICD-10-CM | POA: Diagnosis not present

## 2023-09-30 LAB — URINE CULTURE: Culture: 20000 — AB

## 2023-09-30 LAB — CBC
HCT: 33.5 % — ABNORMAL LOW (ref 36.0–46.0)
Hemoglobin: 10.6 g/dL — ABNORMAL LOW (ref 12.0–15.0)
MCH: 29 pg (ref 26.0–34.0)
MCHC: 31.6 g/dL (ref 30.0–36.0)
MCV: 91.8 fL (ref 80.0–100.0)
Platelets: 246 10*3/uL (ref 150–400)
RBC: 3.65 MIL/uL — ABNORMAL LOW (ref 3.87–5.11)
RDW: 14.9 % (ref 11.5–15.5)
WBC: 14.8 10*3/uL — ABNORMAL HIGH (ref 4.0–10.5)
nRBC: 0 % (ref 0.0–0.2)

## 2023-09-30 LAB — BASIC METABOLIC PANEL WITH GFR
Anion gap: 10 (ref 5–15)
BUN: 22 mg/dL (ref 8–23)
CO2: 23 mmol/L (ref 22–32)
Calcium: 8.3 mg/dL — ABNORMAL LOW (ref 8.9–10.3)
Chloride: 107 mmol/L (ref 98–111)
Creatinine, Ser: 0.86 mg/dL (ref 0.44–1.00)
GFR, Estimated: >60 mL/min (ref >60)
Glucose, Bld: 138 mg/dL — ABNORMAL HIGH (ref 70–99)
Potassium: 4.2 mmol/L (ref 3.5–5.1)
Sodium: 140 mmol/L (ref 135–145)

## 2023-09-30 MED ORDER — MORPHINE SULFATE (PF) 2 MG/ML IV SOLN
1.0000 mg | INTRAVENOUS | Status: DC | PRN
  Administered 2023-09-30 – 2023-10-02 (×9): 1 mg via INTRAVENOUS
  Filled 2023-09-30 (×11): qty 1

## 2023-09-30 MED ORDER — LORAZEPAM 2 MG/ML IJ SOLN
1.0000 mg | Freq: Once | INTRAMUSCULAR | Status: AC
  Administered 2023-09-30: 1 mg via INTRAVENOUS
  Filled 2023-09-30: qty 1

## 2023-09-30 MED ORDER — DICLOFENAC SODIUM 1 % EX GEL: 1.0000 | CUTANEOUS | Status: DC | PRN

## 2023-09-30 MED ORDER — LORAZEPAM 2 MG/ML IJ SOLN
1.0000 mg | INTRAMUSCULAR | Status: DC | PRN
  Filled 2023-09-30: qty 1

## 2023-09-30 MED ORDER — FUROSEMIDE 10 MG/ML IJ SOLN
20.0000 mg | Freq: Once | INTRAMUSCULAR | Status: AC
  Administered 2023-09-30: 20 mg via INTRAVENOUS
  Filled 2023-09-30: qty 2

## 2023-09-30 NOTE — Progress Notes (Signed)
 RT removed pt from BIPAP and placed pt on high flow Derby (salter) at 15L. Pt tolerating well at this time. BiPAP on stby at bedside.

## 2023-09-30 NOTE — Progress Notes (Signed)
 PROGRESS NOTE    [name]  ZOX:[id number] DOB: [birth year] DOA: 09/29/2023 PCP: Marguerite Shiley, MD   Brief Narrative:  [name] is a 89 y.o. female who presents from nursing facility with medical history significant of hypertension, paroxysmal atrial fibrillation, chronic cystitis and likely undiagnosed dementia.  She presents from the facility with family with complaints of cough shortness of breath questionable hemoptysis, orthopnea and fever.  In the ED she was found to be profoundly tachycardic with hypoxia, imaging consistent with presumed pneumonia, hospitalist called for admission.   Assessment & Plan:   Principal Problem:   Sepsis due to pneumonia [facility]) Active Problems:   Acute metabolic encephalopathy   Chronic cystitis   Paroxysmal atrial fibrillation (HCC)   Chronic anticoagulation   Orthostatic hypotension   Anxiety   Hyperlipidemia with target LDL less than 130   Goals of care  - Discussed goals of care with both son and daughter at separate occasions, both confirmed patient is DNR/DNI and would not want any heroic measures. - We discussed her high risk of decompensation given her worsening respiratory status over the last few hours requiring BiPAP support with profound tachycardia and ongoing mental status changes. - Family continues to be hopeful patient will recover but understands that should she continue to decline focusing on comfort measures is what she would want and we will do our best to comply with her wishes. - In regards for comfort will discontinue BiPAP and placed on heated high flow, continue IV Ativan  and morphine at low doses for respiratory support and anxiety - If patient improves over the next 24 to 48 hours discussed with palliative care to follow-up with family for ongoing planning moving forward  Acute on chronic respiratory failure with hypoxia Severe sepsis secondary to pneumonia Bilateral pleural effusion, small - Patient uses 2 L  nasal cannula of oxygen  as needed at facility, overnight required BiPAP for respiratory support -now on heated high flow 60 L at 100% to maintain sats greater than 90 - Sepsis criteria met with tachycardia, tachypnea, notable source of pneumonia; mental status changes and lactic acidosis meet criteria for severe sepsis - Initially given IV steroids, broad-spectrum antibiotics, continue in the interim with p.o. prednisone , azithromycin  and ceftriaxone ; procalcitonin minimally elevated -CT ordered, overnight notable for scant bilateral effusion with profound bilateral pneumonia and groundglass opacity - Lasix  given overnight with minimal improvement in symptoms - Lactic downtrending with IV fluid/supplemental oxygen  and supportive care   Acute metabolic encephalopathy -Complicated by questionable underlying dementia -Secondary to above severe sepsis, hypoxia and lactic acidosis -High risk for delirium, sundowning.  Family educated at bedside -Continue supportive care, currently resting comfortably although unable to assess orientation   Chronic cystitis History of cephalosporin resistant UTI, on chronic Macrobid , cultures unremarkable, only 20k colony   Paroxysmal atrial fibrillation on chronic anticoagulation, Improving with treatment of above, sinus tachycardia in the low 100-110 range -Continue amiodarone  and Eliquis , if unable to tolerate p.o. will transition to IV amiodarone  and heparin    Heart failure with mildly reduced ejection fraction -Furosemide  overnight with no improvement clinically -Encourage p.o. intake - Echo 45-50% left ventricular hypokinesis globally - Strict I&O's and daily weights   Orthostatic hypotension -Chronic. At baseline patient has a history of multiple syncopal episodes and near syncope  - Midodrine  being held in the setting of hypertension, likely driven by agitation and hypoxia as above  Anxiety - Continue Lexapro  and Xanax  -As needed IV alprazolam  and  morphine   Hyperlipidemia -  Continue atorvastatin  DVT prophylaxis:  apixaban  (ELIQUIS ) tablet 5 mg   Code Status:   Code Status: Do not attempt resuscitation (DNR) PRE-ARREST INTERVENTIONS DESIRED  Family Communication: At bedside, son, and daughter  Status is: Inpatient  Dispo: The patient is from: Facility              Anticipated d/c is to: To be determined              Anticipated d/c date is: To be determined              Patient currently not medically stable for discharge  Consultants:  None  Procedures:  None  Antimicrobials:  Azithromycin , ceftriaxone   Subjective: Overnight worsening respiratory status, IV Lasix  given, placed on BiPAP with moderate improvement in symptoms.  Review of systems markedly limited by patient's baseline and acute encephalopathy  Objective: Vitals:   09/30/23 0000 09/30/23 0021 09/30/23 0100 09/30/23 0338  BP:    (!) 164/95  Pulse:    91  Resp:  (!) 29  20  Temp:    98.9 F (37.2 C)  TempSrc:    Oral  SpO2: 90% 93% 92% 90%    Intake/Output Summary (Last 24 hours) at 09/30/2023 0740 Last data filed at 09/29/2023 1837 Gross per 24 hour  Intake 2004.41 ml  Output 145 ml  Net 1859.41 ml   There were no vitals filed for this visit.  Examination:  General: Agitated, mild distress pulling at BiPAP mask, unable to be oriented, alert to person only HEENT: BiPAP mask on with good seal. Neck:  Without mass or deformity.  Trachea is midline. Lungs: Diffuse bilateral rhonchi with coarse breath sounds globally, tachypneic. Heart: Tachycardic, regular rhythm Abdomen:  Soft, nontender, nondistended.  Without guarding or rebound. Extremities: Without cyanosis, clubbing, edema, or obvious deformity. Skin:  Warm and dry, no erythema. -Noted pressure ulcer on heel per staff, see documentation    Data Reviewed: I have personally reviewed following labs and imaging studies  CBC: Recent Labs  Lab 09/29/23 0733 09/30/23 0324  WBC 15.3*  14.8*  NEUTROABS 12.4*  --   HGB 11.2* 10.6*  HCT 35.5* 33.5*  MCV 92.4 91.8  PLT 242 246   Basic Metabolic Panel: Recent Labs  Lab 09/29/23 0733 09/30/23 0324  NA 139 140  K 3.1* 4.2  CL 105 107  CO2 21* 23  GLUCOSE 149* 138*  BUN 27* 22  CREATININE 1.00 0.86  CALCIUM  8.2* 8.3*   GFR: Estimated Creatinine Clearance: 39.9 mL/min (by C-G formula based on SCr of 0.86 mg/dL). Liver Function Tests: Recent Labs  Lab 09/29/23 0733  AST 30  ALT 14  ALKPHOS 48  BILITOT 0.9  PROT 7.1  ALBUMIN 2.8*   No results for input(s): LIPASE, AMYLASE in the last 168 hours. No results for input(s): AMMONIA in the last 168 hours. Coagulation Profile: Recent Labs  Lab 09/29/23 0733  INR 1.3*   Sepsis Labs: Recent Labs  Lab 09/29/23 0733 09/29/23 0748 09/29/23 1052 09/29/23 1334 09/29/23 1957 09/29/23 2239  PROCALCITON 0.77  --   --   --   --   --   LATICACIDVEN  --    < > 5.3* 5.7* 2.0* 2.0*   < > = values in this interval not displayed.    Recent Results (from the past 240 hours)  Resp panel by RT-PCR (RSV, Flu A&B, Covid) Anterior Nasal Swab     Status: None   Collection Time: 09/29/23  7:25  AM   Specimen: Anterior Nasal Swab  Result Value Ref Range Status   SARS Coronavirus 2 by RT PCR NEGATIVE NEGATIVE Final   Influenza A by PCR NEGATIVE NEGATIVE Final   Influenza B by PCR NEGATIVE NEGATIVE Final    Comment: (NOTE) The Xpert Xpress SARS-CoV-2/FLU/RSV plus assay is intended as an aid in the diagnosis of influenza from Nasopharyngeal swab specimens and should not be used as a sole basis for treatment. Nasal washings and aspirates are unacceptable for Xpert Xpress SARS-CoV-2/FLU/RSV testing.  Fact Sheet for Patients: BloggerCourse.com  Fact Sheet for Healthcare Providers: SeriousBroker.it  This test is not yet approved or cleared by the [place]  FDA and has been authorized for detection and/or  diagnosis of SARS-CoV-2 by FDA under an Emergency Use Authorization (EUA). This EUA will remain in effect (meaning this test can be used) for the duration of the COVID-19 declaration under Section 564(b)(1) of the Act, 21 U.S.C. section 360bbb-3(b)(1), unless the authorization is terminated or revoked.     Resp Syncytial Virus by PCR NEGATIVE NEGATIVE Final    Comment: (NOTE) Fact Sheet for Patients: BloggerCourse.com  Fact Sheet for Healthcare Providers: SeriousBroker.it  This test is not yet approved or cleared by the [place]  FDA and has been authorized for detection and/or diagnosis of SARS-CoV-2 by FDA under an Emergency Use Authorization (EUA). This EUA will remain in effect (meaning this test can be used) for the duration of the COVID-19 declaration under Section 564(b)(1) of the Act, 21 U.S.C. section 360bbb-3(b)(1), unless the authorization is terminated or revoked.  Performed at [facility], [address] [place], [place] [address]   Blood Culture (routine x 2)     Status: None (Preliminary result)   Collection Time: 09/29/23  7:25 AM   Specimen: BLOOD LEFT FOREARM  Result Value Ref Range Status   Specimen Description BLOOD LEFT FOREARM  Final   Special Requests   Final    BOTTLES DRAWN AEROBIC AND ANAEROBIC Blood Culture results may not be optimal due to an inadequate volume of blood received in culture bottles   Culture   Final    NO GROWTH < 24 HOURS Performed at [facility], [address] [place], [place] [address]    Report Status PENDING  Incomplete  Blood Culture (routine x 2)     Status: None (Preliminary result)   Collection Time: 09/29/23  7:30 AM   Specimen: BLOOD  Result Value Ref Range Status   Specimen Description BLOOD RIGHT ANTECUBITAL  Final   Special Requests   Final    BOTTLES DRAWN AEROBIC AND ANAEROBIC Blood Culture results may not be optimal due to an inadequate volume  of blood received in culture bottles   Culture   Final    NO GROWTH < 24 HOURS Performed at [facility], [address], [place], [place] [address]    Report Status PENDING  Incomplete  MRSA Next Gen by PCR, Nasal     Status: None   Collection Time: 09/29/23 10:41 AM   Specimen: Nasal Mucosa; Nasal Swab  Result Value Ref Range Status   MRSA by PCR Next Gen NOT DETECTED NOT DETECTED Final    Comment: (NOTE) The GeneXpert MRSA Assay (FDA approved for NASAL specimens only), is one component of a comprehensive MRSA colonization surveillance program. It is not intended to diagnose MRSA infection nor to guide or monitor treatment for MRSA infections. Test performance is not FDA approved in patients less  than 31 years old. Performed at [facility], [address], [place], [place] [address]          Radiology Studies: DG Chest Port 1 View Result Date: 09/30/2023 CLINICAL DATA:  [id number] with shortness of breath. EXAM: PORTABLE CHEST 1 VIEW COMPARISON:  Portable chest yesterday at 7:35 a.m. FINDINGS: 5:30 a.m. Today there is interval worsening of right greater than left patchy airspace disease, which is increasingly dense and more confluent on both sides, but again with left apical sparing. Small pleural effusions have also increased. There could be an edematous component. The heart is enlarged and there is increased fullness in the central vasculature. There is aortic atherosclerosis with stable mediastinum. No new osseous findings. IMPRESSION: 1. Interval worsening of right greater than left patchy airspace disease, which is increasingly dense and more confluent on both sides, but again with left apical sparing. 2. Small pleural effusions have also increased. 3. Cardiomegaly with increased fullness in the central vasculature. Electronically Signed   By: Denman Fischer M.D.   On: 09/30/2023 05:43   CT CHEST WO CONTRAST Result Date: 09/29/2023 CLINICAL DATA:  Hemoptysis and  shortness of breath EXAM: CT CHEST WITHOUT CONTRAST TECHNIQUE: Multidetector CT imaging of the chest was performed following the standard protocol without IV contrast. RADIATION DOSE REDUCTION: This exam was performed according to the departmental dose-optimization program which includes automated exposure control, adjustment of the mA and/or kV according to patient size and/or use of iterative reconstruction technique. COMPARISON:  Chest x-ray from earlier in the same day. FINDINGS: Cardiovascular: Somewhat limited due to lack of IV contrast. Atherosclerotic calcifications of the thoracic aorta are noted. Ascending aorta measures 4 cm. No enlargement of the pulmonary artery is seen. Coronary calcifications are noted. Mediastinum/Nodes: Thoracic inlet is within normal limits. Mediastinal adenopathy is noted particularly in the right paratracheal region measuring up to 10 mm in short axis. AP window node is seen measuring 12 mm in short axis. These changes are likely reactive in nature given the lung findings Lungs/Pleura: Lungs are well aerated bilaterally. Diffuse infiltrate is noted throughout the majority of the right lung similar to that seen on the prior exam. Patchy infiltrates are seen within the left upper lobe. Small effusions are seen bilaterally. Upper Abdomen: Visualized upper abdomen shows no acute abnormality. Musculoskeletal: No chest wall mass or suspicious bone lesions identified. IMPRESSION: Patchy airspace opacities right greater than left similar to that seen on prior chest x-ray. Reactive adenopathy in the mediastinum is seen. Small effusions bilaterally. Dilatation of the ascending aorta to 4 cm. Follow-up can be performed as clinically indicated given the patient's advanced age. Aortic Atherosclerosis (ICD10-I70.0). Electronically Signed   By: Violeta Grey M.D.   On: 09/29/2023 19:49   ECHOCARDIOGRAM COMPLETE Result Date: 09/29/2023    ECHOCARDIOGRAM REPORT   Patient Name:   [name]  Date of Exam: 09/29/2023 Medical Rec #:  [id number]     Height:       66.0 in Accession #:    [id number]    Weight:       147.5 lb Date of Birth:  November 25, 1930      BSA:          1.757 m Patient Age:    89 years      BP:           140/70 mmHg Patient Gender: F             HR:  84 bpm. Exam Location:  Inpatient Procedure: 2D Echo, Color Doppler and Cardiac Doppler (Both Spectral and Color            Flow Doppler were utilized during procedure). Indications:    CHF  History:        Patient has prior history of Echocardiogram examinations. CHF.  Sonographer:    Farrell Honey Key Referring Phys: [id number] RONDELL A SMITH IMPRESSIONS  1. Left ventricular ejection fraction, by estimation, is 45 to 50%. The left ventricle has mildly decreased function. The left ventricle demonstrates global hypokinesis. There is mild concentric left ventricular hypertrophy. Left ventricular diastolic function could not be evaluated.  2. Right ventricular systolic function is normal. The right ventricular size is normal.  3. Left atrial size was moderately dilated.  4. Right atrial size was severely dilated.  5. The mitral valve is abnormal. Trivial mitral valve regurgitation. Severe mitral annular calcification.  6. The aortic valve is tricuspid. There is mild calcification of the aortic valve. Aortic valve regurgitation is mild. No aortic stenosis is present.  7. The inferior vena cava is dilated in size with <50% respiratory variability, suggesting right atrial pressure of 15 mmHg. Comparison(s): No significant change from prior study. FINDINGS  Left Ventricle: Left ventricular ejection fraction, by estimation, is 45 to 50%. The left ventricle has mildly decreased function. The left ventricle demonstrates global hypokinesis. The left ventricular internal cavity size was normal in size. There is  mild concentric left ventricular hypertrophy. Left ventricular diastolic function could not be evaluated due to mitral annular calcification (moderate  or greater). Left ventricular diastolic function could not be evaluated. Right Ventricle: The right ventricular size is normal. No increase in right ventricular wall thickness. Right ventricular systolic function is normal. Left Atrium: Left atrial size was moderately dilated. Right Atrium: Right atrial size was severely dilated. Pericardium: Trivial pericardial effusion is present. Mitral Valve: The mitral valve is abnormal. There is mild thickening of the mitral valve leaflet(s). There is moderate calcification of the mitral valve leaflet(s). Severe mitral annular calcification. Trivial mitral valve regurgitation. MV peak gradient, 6.6 mmHg. The mean mitral valve gradient is 4.0 mmHg. Tricuspid Valve: The tricuspid valve is normal in structure. Tricuspid valve regurgitation is mild . No evidence of tricuspid stenosis. Aortic Valve: The aortic valve is tricuspid. There is mild calcification of the aortic valve. Aortic valve regurgitation is mild. No aortic stenosis is present. Pulmonic Valve: The pulmonic valve was grossly normal. Pulmonic valve regurgitation is not visualized. No evidence of pulmonic stenosis. Aorta: The aortic root and ascending aorta are structurally normal, with no evidence of dilitation. Venous: The inferior vena cava is dilated in size with less than 50% respiratory variability, suggesting right atrial pressure of 15 mmHg. IAS/Shunts: The atrial septum is grossly normal.  LEFT VENTRICLE PLAX 2D LVIDd:         4.10 cm      Diastology LVIDs:         2.20 cm      LV e' medial:    5.22 cm/s LV PW:         1.20 cm      LV E/e' medial:  25.3 LV IVS:        1.20 cm      LV e' lateral:   9.68 cm/s LVOT diam:     2.20 cm      LV E/e' lateral: 13.6 LV SV:         80 LV SV Index:  46 LVOT Area:     3.80 cm  LV Volumes (MOD) LV vol d, MOD A2C: 100.3 ml LV vol d, MOD A4C: 112.3 ml LV vol s, MOD A2C: 56.3 ml LV vol s, MOD A4C: 59.4 ml LV SV MOD A2C:     44.0 ml LV SV MOD A4C:     112.3 ml LV SV MOD BP:       49.8 ml RIGHT VENTRICLE RV Basal diam:  3.50 cm RV S prime:     11.90 cm/s TAPSE (M-mode): 1.9 cm LEFT ATRIUM             Index        RIGHT ATRIUM           Index LA diam:        3.70 cm 2.11 cm/m   RA Area:     28.60 cm LA Vol (A2C):   87.0 ml 49.51 ml/m  RA Volume:   104.05 ml 59.22 ml/m LA Vol (A4C):   76.3 ml 43.42 ml/m LA Biplane Vol: 82.3 ml 46.84 ml/m  AORTIC VALVE LVOT Vmax:   108.00 cm/s LVOT Vmean:  75.500 cm/s LVOT VTI:    0.211 m  AORTA Ao Root diam: 3.60 cm Ao Asc diam:  3.60 cm MITRAL VALVE MV Area (PHT): 5.84 cm     SHUNTS MV Area VTI:   2.51 cm     Systemic VTI:  0.21 m MV Peak grad:  6.6 mmHg     Systemic Diam: 2.20 cm MV Mean grad:  4.0 mmHg MV Vmax:       1.28 m/s MV Vmean:      100.0 cm/s MV Decel Time: 130 msec MV E velocity: 132.00 cm/s MV A velocity: 104.00 cm/s MV E/A ratio:  1.27 Sheryle Donning MD Electronically signed by Sheryle Donning MD Signature Date/Time: 09/29/2023/7:46:06 PM    Final    DG Chest Port 1 View Result Date: 09/29/2023 CLINICAL DATA:  Questionable sepsis. EXAM: PORTABLE CHEST 1 VIEW COMPARISON:  Portable chest 01/19/2022 FINDINGS: There is widespread patchy airspace disease on the right-greater-than-left, relatively sparing the apical left lung. Findings are most likely due to multilobar pneumonia in this clinical setting. Small pleural effusions are beginning to form. There is mild cardiomegaly with normal caliber of the central vessels. There is aortic atherosclerosis with stable mediastinum. Interstitial thickening underlies some of the opacities and could be infectious or due to edema. There are degenerative changes of the spine shoulders with chronic rotator cuff arthropathy. IMPRESSION: 1. Widespread patchy airspace disease on the right-greater-than-left, most likely due to multilobar pneumonia in this clinical setting. 2. Small pleural effusions are beginning to form. 3. Interstitial thickening underlies some of the opacities and could be  infectious or due to edema. 4. Aortic atherosclerosis. Electronically Signed   By: Denman Fischer M.D.   On: 09/29/2023 07:46   Scheduled Meds:  ALPRAZolam   0.25 mg Oral QHS   amiodarone   100 mg Oral q morning   apixaban   5 mg Oral BID   escitalopram   20 mg Oral Daily   guaiFENesin   600 mg Oral BID   lactase  9,000 Units Oral TID   mirtazapine   30 mg Oral QHS   nitrofurantoin  (macrocrystal-monohydrate)  100 mg Oral QHS   pravastatin   40 mg Oral q1800   predniSONE   40 mg Oral Q breakfast   psyllium  1 packet Oral Daily   saccharomyces boulardii  250 mg Oral BID   sodium chloride  flush  3 mL Intravenous Q12H   Continuous Infusions:  cefTRIAXone  (ROCEPHIN )  IV       LOS: 1 day   Time spent: spent at bedside, discussing case with family in regards to goals of care, palliative care as well as current clinical status  Haydee Lipa, DO [facility]  If 7PM-7AM, please contact night-coverage [email or web address]  09/30/2023, 7:40 AM

## 2023-09-30 NOTE — Plan of Care (Signed)
  Problem: Clinical Measurements: Goal: Respiratory complications will improve Outcome: Not Progressing Note: Increase O2 demand    Problem: Coping: Goal: Level of anxiety will decrease Outcome: Not Progressing Note: Increased anxiety

## 2023-09-30 NOTE — Progress Notes (Signed)
 Pt heart rate remains in 120s,12 Lead EKG done,shows A fib RVR,pt is in respiratory distress with increase demand of oxygen  currently she in in 15l HFNC and  15 l non rebreather ,MD notified ,ordered for ativan ,RT notified

## 2023-09-30 NOTE — Progress Notes (Signed)
   09/30/23 0021  BiPAP/CPAP/SIPAP  $ Non-Invasive Ventilator  Non-Invasive Vent Initial  $ Face Mask Small Yes  BiPAP/CPAP/SIPAP Pt Type Adult  BiPAP/CPAP/SIPAP Resmed  Mask Type Full face mask  Mask Size Small  IPAP 8 cmH20  EPAP 4 cmH2O  Flow Rate 5 lpm  Patient Home Machine No  Patient Home Mask No  Patient Home Tubing No  Auto Titrate No  Device Plugged into RED Power Outlet Yes  BiPAP/CPAP /SiPAP Vitals  Resp (!) 29  SpO2 93 %  Bilateral Breath Sounds Rhonchi  MEWS Score/Color  MEWS Score 2  MEWS Score Color Yellow

## 2023-09-30 NOTE — Plan of Care (Signed)
 Patient's nurse notified me that patient was getting more short of breath and is on 15 L high flow oxygen .  On exam at bedside patient is dyspneic and has mild cough.  I reviewed patient's labs notes and medications.  Patient is DNR.  Not tolerating BiPAP.  Blood pressure is 164/95 pulse 90/min temperature 98.6 respiration 24/min.  O2 sat 94% on 15 L high flow. HEENT anicteric no pallor. Heart S1-S2 heard. Chest bilateral deep breaths and appears coarse crepitations.  Chest x-ray ordered stat shows worsening infiltrates with pleural effusion.  Assessment and plan -     Acute respiratory failure with hypoxia likely due to pneumonia with possible fluid overload. Will continue with high flow oxygen  and antibiotics.  I have ordered 1 dose of Lasix  20 mg IV. Based on the response we will consider further dose of Lasix .  Melford Spotted

## 2023-10-01 DIAGNOSIS — J189 Pneumonia, unspecified organism: Secondary | ICD-10-CM | POA: Diagnosis not present

## 2023-10-01 DIAGNOSIS — A419 Sepsis, unspecified organism: Secondary | ICD-10-CM | POA: Diagnosis not present

## 2023-10-01 LAB — LEGIONELLA PNEUMOPHILA SEROGP 1 UR AG: L. pneumophila Serogp 1 Ur Ag: NEGATIVE

## 2023-10-01 NOTE — Progress Notes (Signed)
 Patients family (son and daughter) does not want patient to be restrained if patient gets agitated and cannot manage with mittens to not pull oxygen  off. Patients family would like patient to medicated instead. Patient currently has PRN ativan  if needed.

## 2023-10-01 NOTE — Plan of Care (Signed)
  Problem: Clinical Measurements: Goal: Ability to maintain clinical measurements within normal limits will improve Outcome: Progressing Goal: Will remain free from infection Outcome: Progressing Goal: Diagnostic test results will improve Outcome: Progressing Goal: Cardiovascular complication will be avoided Outcome: Progressing   Problem: Nutrition: Goal: Adequate nutrition will be maintained Outcome: Progressing   Problem: Elimination: Goal: Will not experience complications related to bowel motility Outcome: Progressing Goal: Will not experience complications related to urinary retention Outcome: Progressing   Problem: Pain Managment: Goal: General experience of comfort will improve and/or be controlled Outcome: Progressing   Problem: Safety: Goal: Ability to remain free from injury will improve Outcome: Progressing   Problem: Skin Integrity: Goal: Risk for impaired skin integrity will decrease Outcome: Progressing   Problem: Education: Goal: Knowledge of General Education information will improve Description: Including pain rating scale, medication(s)/side effects and non-pharmacologic comfort measures Outcome: Not Progressing   Problem: Health Behavior/Discharge Planning: Goal: Ability to manage health-related needs will improve Outcome: Not Progressing   Problem: Clinical Measurements: Goal: Respiratory complications will improve Outcome: Not Progressing   Problem: Activity: Goal: Risk for activity intolerance will decrease Outcome: Not Progressing   Problem: Coping: Goal: Level of anxiety will decrease Outcome: Not Progressing

## 2023-10-01 NOTE — Progress Notes (Signed)
 PROGRESS NOTE    [name]  ZOX:[id number] DOB: 1930-10-13 DOA: 09/29/2023 PCP: Marguerite Shiley, MD   Brief Narrative:  [name] is a 89 y.o. female who presents from nursing facility with medical history significant of hypertension, paroxysmal atrial fibrillation, chronic cystitis and likely undiagnosed dementia.  She presents from the facility with family with complaints of cough shortness of breath questionable hemoptysis, orthopnea and fever.  In the ED she was found to be profoundly tachycardic with hypoxia, imaging consistent with presumed pneumonia, hospitalist called for admission.   Assessment & Plan:   Principal Problem:   Sepsis due to pneumonia [facility]) Active Problems:   Acute metabolic encephalopathy   Chronic cystitis   Paroxysmal atrial fibrillation (HCC)   Chronic anticoagulation   Orthostatic hypotension   Anxiety   Hyperlipidemia with target LDL less than 130   Goals of care  - Discussed goals of care with both son and daughter at separate occasions, both confirmed patient is DNR/DNI and would not want any heroic measures. - We discussed her high risk of decompensation given her worsening respiratory status over the last few hours requiring BiPAP support with profound tachycardia and ongoing mental status changes. - Family continues to be hopeful patient will recover but understands that should she continue to decline focusing on comfort measures is what she would want and we will do our best to comply with her wishes. - In regards for comfort will discontinue BiPAP and placed on heated high flow, continue IV Ativan  and morphine at low doses for respiratory support and anxiety - If patient improves over the next 24 to 48 hours discussed with palliative care to follow-up with family for ongoing planning moving forward - Patient removed oxygen  this morning with rapid decline with profound hypoxia and bradycardia.  CODE BLUE was called but then canceled given  patient's DNR and DNI status, converted to rapid response with increased oxygen  supplied by nonrebreather and heated high flow replaced.  Patient's respiratory status and hypoxia resolved quite quickly.  Patient's daughter was at bedside, son updated over the phone, discussed patient's high risk for decompensation given episodes like this.  Acute on chronic respiratory failure with hypoxia Severe sepsis secondary to pneumonia Bilateral pleural effusion, small - Patient uses 2 L nasal cannula of oxygen  as needed at facility, overnight required BiPAP for respiratory support -now on heated high flow 60 L at 100% to maintain sats greater than 90 - Sepsis criteria met with tachycardia, tachypnea, notable source of pneumonia; mental status changes and lactic acidosis meet criteria for severe sepsis - Initially given IV steroids, broad-spectrum antibiotics, continue in the interim with p.o. prednisone , azithromycin  and ceftriaxone ; procalcitonin minimally elevated -CT ordered, overnight notable for scant bilateral effusion with profound bilateral pneumonia and groundglass opacity - Lasix  given overnight with minimal improvement in symptoms - Lactic downtrending with IV fluid/supplemental oxygen  and supportive care   Acute metabolic encephalopathy On likely underlying dementia -Secondary to above severe sepsis, hypoxia and lactic acidosis -High risk for delirium, sundowning.  Family educated at bedside -Continue supportive care, currently resting comfortably although unable to assess orientation   Chronic cystitis History of cephalosporin resistant UTI, on chronic Macrobid , cultures unremarkable, only 20k colony   Paroxysmal atrial fibrillation on chronic anticoagulation, Improving with treatment of above, sinus tachycardia in the low 100-110 range -Continue amiodarone  and Eliquis , if unable to tolerate p.o. will transition to IV amiodarone  and heparin    Heart failure with mildly reduced ejection  fraction -Furosemide  overnight  with no improvement clinically -Encourage p.o. intake - Echo 45-50% left ventricular hypokinesis globally - Strict I&O's and daily weights   Orthostatic hypotension -Chronic. At baseline patient has a history of multiple syncopal episodes and near syncope  - Midodrine  being held in the setting of hypertension, likely driven by agitation and hypoxia as above  Anxiety - Continue Lexapro  and Xanax  -As needed IV alprazolam  and morphine   Hyperlipidemia - Continue atorvastatin  DVT prophylaxis: apixaban  (ELIQUIS ) tablet 5 mg  Code Status:   Code Status: Limited: Do not attempt resuscitation (DNR) -DNR-LIMITED -Do Not Intubate/DNI  Family Communication: At bedside, son, and daughter  Status is: Inpatient  Dispo: The patient is from: Facility              Anticipated d/c is to: To be determined              Anticipated d/c date is: To be determined              Patient currently not medically stable for discharge  Consultants:  None  Procedures:  None  Antimicrobials:  Azithromycin , ceftriaxone   Subjective: No acute issues or events overnight, patient's respiratory status and heart rate appear to be improving.  Review of systems still markedly limited by patient's mental status but she appears more calm and appropriate. *Rapid response called late morning as above*  Objective: Vitals:   10/01/23 0100 10/01/23 0200 10/01/23 0400 10/01/23 0757  BP:   125/80   Pulse: (!) 109 (!) 108 (!) 109 (!) 106  Resp:   (!) 22 20  Temp:   98.2 F (36.8 C) 98.7 F (37.1 C)  TempSrc:   Oral Oral  SpO2:   100% 100%  Weight:        Intake/Output Summary (Last 24 hours) at 10/01/2023 0800 Last data filed at 10/01/2023 0500 Gross per 24 hour  Intake 500 ml  Output 750 ml  Net -250 ml   Filed Weights   09/30/23 0720  Weight: 66.8 kg    Examination:  General: Resting comfortably, tolerating heated high flow quite well Neck:  Without mass or  deformity.  Trachea is midline. Lungs: Diffuse bilateral rhonchi with coarse breath sounds globally, tachypneic. Heart: Tachycardic, irregularly irregular Abdomen:  Soft, nontender, nondistended.  Without guarding or rebound. Extremities: Without cyanosis, clubbing, edema, or obvious deformity. Skin:  Warm and dry, no erythema. -Noted pressure ulcer on heel per staff, see documentation    Data Reviewed: I have personally reviewed following labs and imaging studies  CBC: Recent Labs  Lab 09/29/23 0733 09/30/23 0324  WBC 15.3* 14.8*  NEUTROABS 12.4*  --   HGB 11.2* 10.6*  HCT 35.5* 33.5*  MCV 92.4 91.8  PLT 242 246   Basic Metabolic Panel: Recent Labs  Lab 09/29/23 0733 09/30/23 0324  NA 139 140  K 3.1* 4.2  CL 105 107  CO2 21* 23  GLUCOSE 149* 138*  BUN 27* 22  CREATININE 1.00 0.86  CALCIUM  8.2* 8.3*   GFR: Estimated Creatinine Clearance: 39.9 mL/min (by C-G formula based on SCr of 0.86 mg/dL).  Liver Function Tests: Recent Labs  Lab 09/29/23 0733  AST 30  ALT 14  ALKPHOS 48  BILITOT 0.9  PROT 7.1  ALBUMIN 2.8*   Coagulation Profile: Recent Labs  Lab 09/29/23 0733  INR 1.3*   Sepsis Labs: Recent Labs  Lab 09/29/23 0733 09/29/23 0748 09/29/23 1052 09/29/23 1334 09/29/23 1957 09/29/23 2239  PROCALCITON 0.77  --   --   --   --   --  LATICACIDVEN  --    < > 5.3* 5.7* 2.0* 2.0*   < > = values in this interval not displayed.    Recent Results (from the past 240 hours)  Resp panel by RT-PCR (RSV, Flu A&B, Covid) Anterior Nasal Swab     Status: None   Collection Time: 09/29/23  7:25 AM   Specimen: Anterior Nasal Swab  Result Value Ref Range Status   SARS Coronavirus 2 by RT PCR NEGATIVE NEGATIVE Final   Influenza A by PCR NEGATIVE NEGATIVE Final   Influenza B by PCR NEGATIVE NEGATIVE Final    Comment: (NOTE) The Xpert Xpress SARS-CoV-2/FLU/RSV plus assay is intended as an aid in the diagnosis of influenza from Nasopharyngeal swab specimens  and should not be used as a sole basis for treatment. Nasal washings and aspirates are unacceptable for Xpert Xpress SARS-CoV-2/FLU/RSV testing.  Fact Sheet for Patients: BloggerCourse.com  Fact Sheet for Healthcare Providers: SeriousBroker.it  This test is not yet approved or cleared by the [place]  FDA and has been authorized for detection and/or diagnosis of SARS-CoV-2 by FDA under an Emergency Use Authorization (EUA). This EUA will remain in effect (meaning this test can be used) for the duration of the COVID-19 declaration under Section 564(b)(1) of the Act, 21 U.S.C. section 360bbb-3(b)(1), unless the authorization is terminated or revoked.     Resp Syncytial Virus by PCR NEGATIVE NEGATIVE Final    Comment: (NOTE) Fact Sheet for Patients: BloggerCourse.com  Fact Sheet for Healthcare Providers: SeriousBroker.it  This test is not yet approved or cleared by the [place]  FDA and has been authorized for detection and/or diagnosis of SARS-CoV-2 by FDA under an Emergency Use Authorization (EUA). This EUA will remain in effect (meaning this test can be used) for the duration of the COVID-19 declaration under Section 564(b)(1) of the Act, 21 U.S.C. section 360bbb-3(b)(1), unless the authorization is terminated or revoked.  Performed at [facility], [address], [place], [place] [address]   Blood Culture (routine x 2)     Status: None (Preliminary result)   Collection Time: 09/29/23  7:25 AM   Specimen: BLOOD LEFT FOREARM  Result Value Ref Range Status   Specimen Description BLOOD LEFT FOREARM  Final   Special Requests   Final    BOTTLES DRAWN AEROBIC AND ANAEROBIC Blood Culture results may not be optimal due to an inadequate volume of blood received in culture bottles   Culture   Final    NO GROWTH 2 DAYS Performed at [facility], [address] [place], [place] [address]    Report Status PENDING  Incomplete  Blood Culture (routine x 2)     Status: None (Preliminary result)   Collection Time: 09/29/23  7:30 AM   Specimen: BLOOD  Result Value Ref Range Status   Specimen Description BLOOD RIGHT ANTECUBITAL  Final   Special Requests   Final    BOTTLES DRAWN AEROBIC AND ANAEROBIC Blood Culture results may not be optimal due to an inadequate volume of blood received in culture bottles   Culture   Final    NO GROWTH 2 DAYS Performed at [facility], [address] [place], [place] [address]    Report Status PENDING  Incomplete  Urine Culture     Status: Abnormal   Collection Time: 09/29/23  8:12 AM   Specimen: Urine, Random  Result Value Ref Range Status   Specimen Description URINE, RANDOM  Final   Special Requests  NONE Reflexed from [id number]  Final   Culture (A)  Final    20,000 COLONIES/mL LACTOBACILLUS SPECIES Standardized susceptibility testing for this organism is not available. Performed at [facility], [address] [place], [place] [address]    Report Status 09/30/2023 FINAL  Final  MRSA Next Gen by PCR, Nasal     Status: None   Collection Time: 09/29/23 10:41 AM   Specimen: Nasal Mucosa; Nasal Swab  Result Value Ref Range Status   MRSA by PCR Next Gen NOT DETECTED NOT DETECTED Final    Comment: (NOTE) The GeneXpert MRSA Assay (FDA approved for NASAL specimens only), is one component of a comprehensive MRSA colonization surveillance program. It is not intended to diagnose MRSA infection nor to guide or monitor treatment for MRSA infections. Test performance is not FDA approved in patients less than 55 years old. Performed at [facility], [address] [place], [place] [address]          Radiology Studies: DG Chest Port 1 View Result Date: 09/30/2023 CLINICAL DATA:  [id number] with shortness of breath. EXAM: PORTABLE CHEST 1 VIEW COMPARISON:  Portable chest yesterday at 7:35 a.m. FINDINGS:  5:30 a.m. Today there is interval worsening of right greater than left patchy airspace disease, which is increasingly dense and more confluent on both sides, but again with left apical sparing. Small pleural effusions have also increased. There could be an edematous component. The heart is enlarged and there is increased fullness in the central vasculature. There is aortic atherosclerosis with stable mediastinum. No new osseous findings. IMPRESSION: 1. Interval worsening of right greater than left patchy airspace disease, which is increasingly dense and more confluent on both sides, but again with left apical sparing. 2. Small pleural effusions have also increased. 3. Cardiomegaly with increased fullness in the central vasculature. Electronically Signed   By: Denman Fischer M.D.   On: 09/30/2023 05:43   CT CHEST WO CONTRAST Result Date: 09/29/2023 CLINICAL DATA:  Hemoptysis and shortness of breath EXAM: CT CHEST WITHOUT CONTRAST TECHNIQUE: Multidetector CT imaging of the chest was performed following the standard protocol without IV contrast. RADIATION DOSE REDUCTION: This exam was performed according to the departmental dose-optimization program which includes automated exposure control, adjustment of the mA and/or kV according to patient size and/or use of iterative reconstruction technique. COMPARISON:  Chest x-ray from earlier in the same day. FINDINGS: Cardiovascular: Somewhat limited due to lack of IV contrast. Atherosclerotic calcifications of the thoracic aorta are noted. Ascending aorta measures 4 cm. No enlargement of the pulmonary artery is seen. Coronary calcifications are noted. Mediastinum/Nodes: Thoracic inlet is within normal limits. Mediastinal adenopathy is noted particularly in the right paratracheal region measuring up to 10 mm in short axis. AP window node is seen measuring 12 mm in short axis. These changes are likely reactive in nature given the lung findings Lungs/Pleura: Lungs are well  aerated bilaterally. Diffuse infiltrate is noted throughout the majority of the right lung similar to that seen on the prior exam. Patchy infiltrates are seen within the left upper lobe. Small effusions are seen bilaterally. Upper Abdomen: Visualized upper abdomen shows no acute abnormality. Musculoskeletal: No chest wall mass or suspicious bone lesions identified. IMPRESSION: Patchy airspace opacities right greater than left similar to that seen on prior chest x-ray. Reactive adenopathy in the mediastinum is seen. Small effusions bilaterally. Dilatation of the ascending aorta to 4 cm. Follow-up can be performed as clinically indicated given the patient's advanced age. Aortic  Atherosclerosis (ICD10-I70.0). Electronically Signed   By: Violeta Grey M.D.   On: 09/29/2023 19:49   ECHOCARDIOGRAM COMPLETE Result Date: 09/29/2023    ECHOCARDIOGRAM REPORT   Patient Name:   UYEN EICHHOLZ Date of Exam: 09/29/2023 Medical Rec #:  [id number]     Height:       66.0 in Accession #:    [id number]    Weight:       147.5 lb Date of Birth:  1931/02/05      BSA:          1.757 m Patient Age:    89 years      BP:           140/70 mmHg Patient Gender: F             HR:           84 bpm. Exam Location:  Inpatient Procedure: 2D Echo, Color Doppler and Cardiac Doppler (Both Spectral and Color            Flow Doppler were utilized during procedure). Indications:    CHF  History:        Patient has prior history of Echocardiogram examinations. CHF.  Sonographer:    Farrell Honey Key Referring Phys: [id number] RONDELL A SMITH IMPRESSIONS  1. Left ventricular ejection fraction, by estimation, is 45 to 50%. The left ventricle has mildly decreased function. The left ventricle demonstrates global hypokinesis. There is mild concentric left ventricular hypertrophy. Left ventricular diastolic function could not be evaluated.  2. Right ventricular systolic function is normal. The right ventricular size is normal.  3. Left atrial size was moderately dilated.  4.  Right atrial size was severely dilated.  5. The mitral valve is abnormal. Trivial mitral valve regurgitation. Severe mitral annular calcification.  6. The aortic valve is tricuspid. There is mild calcification of the aortic valve. Aortic valve regurgitation is mild. No aortic stenosis is present.  7. The inferior vena cava is dilated in size with <50% respiratory variability, suggesting right atrial pressure of 15 mmHg. Comparison(s): No significant change from prior study. FINDINGS  Left Ventricle: Left ventricular ejection fraction, by estimation, is 45 to 50%. The left ventricle has mildly decreased function. The left ventricle demonstrates global hypokinesis. The left ventricular internal cavity size was normal in size. There is  mild concentric left ventricular hypertrophy. Left ventricular diastolic function could not be evaluated due to mitral annular calcification (moderate or greater). Left ventricular diastolic function could not be evaluated. Right Ventricle: The right ventricular size is normal. No increase in right ventricular wall thickness. Right ventricular systolic function is normal. Left Atrium: Left atrial size was moderately dilated. Right Atrium: Right atrial size was severely dilated. Pericardium: Trivial pericardial effusion is present. Mitral Valve: The mitral valve is abnormal. There is mild thickening of the mitral valve leaflet(s). There is moderate calcification of the mitral valve leaflet(s). Severe mitral annular calcification. Trivial mitral valve regurgitation. MV peak gradient, 6.6 mmHg. The mean mitral valve gradient is 4.0 mmHg. Tricuspid Valve: The tricuspid valve is normal in structure. Tricuspid valve regurgitation is mild . No evidence of tricuspid stenosis. Aortic Valve: The aortic valve is tricuspid. There is mild calcification of the aortic valve. Aortic valve regurgitation is mild. No aortic stenosis is present. Pulmonic Valve: The pulmonic valve was grossly normal.  Pulmonic valve regurgitation is not visualized. No evidence of pulmonic stenosis. Aorta: The aortic root and ascending aorta are structurally normal, with no evidence of dilitation. Venous: The  inferior vena cava is dilated in size with less than 50% respiratory variability, suggesting right atrial pressure of 15 mmHg. IAS/Shunts: The atrial septum is grossly normal.  LEFT VENTRICLE PLAX 2D LVIDd:         4.10 cm      Diastology LVIDs:         2.20 cm      LV e' medial:    5.22 cm/s LV PW:         1.20 cm      LV E/e' medial:  25.3 LV IVS:        1.20 cm      LV e' lateral:   9.68 cm/s LVOT diam:     2.20 cm      LV E/e' lateral: 13.6 LV SV:         80 LV SV Index:   46 LVOT Area:     3.80 cm  LV Volumes (MOD) LV vol d, MOD A2C: 100.3 ml LV vol d, MOD A4C: 112.3 ml LV vol s, MOD A2C: 56.3 ml LV vol s, MOD A4C: 59.4 ml LV SV MOD A2C:     44.0 ml LV SV MOD A4C:     112.3 ml LV SV MOD BP:      49.8 ml RIGHT VENTRICLE RV Basal diam:  3.50 cm RV S prime:     11.90 cm/s TAPSE (M-mode): 1.9 cm LEFT ATRIUM             Index        RIGHT ATRIUM           Index LA diam:        3.70 cm 2.11 cm/m   RA Area:     28.60 cm LA Vol (A2C):   87.0 ml 49.51 ml/m  RA Volume:   104.05 ml 59.22 ml/m LA Vol (A4C):   76.3 ml 43.42 ml/m LA Biplane Vol: 82.3 ml 46.84 ml/m  AORTIC VALVE LVOT Vmax:   108.00 cm/s LVOT Vmean:  75.500 cm/s LVOT VTI:    0.211 m  AORTA Ao Root diam: 3.60 cm Ao Asc diam:  3.60 cm MITRAL VALVE MV Area (PHT): 5.84 cm     SHUNTS MV Area VTI:   2.51 cm     Systemic VTI:  0.21 m MV Peak grad:  6.6 mmHg     Systemic Diam: 2.20 cm MV Mean grad:  4.0 mmHg MV Vmax:       1.28 m/s MV Vmean:      100.0 cm/s MV Decel Time: 130 msec MV E velocity: 132.00 cm/s MV A velocity: 104.00 cm/s MV E/A ratio:  1.27 Sheryle Donning MD Electronically signed by Sheryle Donning MD Signature Date/Time: 09/29/2023/7:46:06 PM    Final    Scheduled Meds:  ALPRAZolam   0.25 mg Oral QHS   amiodarone   100 mg Oral q morning    apixaban   5 mg Oral BID   escitalopram   20 mg Oral Daily   guaiFENesin   600 mg Oral BID   lactase  9,000 Units Oral TID   mirtazapine   30 mg Oral QHS   nitrofurantoin  (macrocrystal-monohydrate)  100 mg Oral QHS   pravastatin   40 mg Oral q1800   predniSONE   40 mg Oral Q breakfast   psyllium  1 packet Oral Daily   saccharomyces boulardii  250 mg Oral BID   sodium chloride  flush  3 mL Intravenous Q12H   Continuous Infusions:  cefTRIAXone  (ROCEPHIN )  IV 2 g (  09/30/23 0929)     LOS: 2 days   Time spent: spent at bedside, discussing case with family in regards to goals of care, palliative care as well as current clinical status  Haydee Lipa, DO [facility]  If 7PM-7AM, please contact night-coverage [email or web address]  10/01/2023, 8:00 AM

## 2023-10-01 NOTE — Progress Notes (Signed)
 Telemetry called and notified this RN of patient not reading any telemetry or respirations on the monitor. Patient had been checked on by this RN approximately 5-10 minutes prior to this and patient was stable. Upon entering room to check on patient again, pt was found agonal breathing and cyanotic. Patient had removed HHFNC. Patient had faint femoral pulse, with blood pressure reading 86/75 (MAP 80). Rapid response presented to bedside and assisted with bagged breathing and oral suction. Oxygen  adjusted by respiratory (see RT note). Patient DNR - Limited. Patient able to follow commands post-bagged breathing. Morphine 1 mg administered per MD Rudine Cos. Bilateral mittens applied to patient. Will continue to monitor. Daughter and son updated. Daughter at bedside at this time.

## 2023-10-01 NOTE — Significant Event (Signed)
 Rapid Response Event Note   Reason for Call :  Code Blue, called by secretary prior to page  Initial Focused Assessment:  RN called by CCMD that patient not on monitor--Patient found to have agonal respirations and gray/blue pallor of face. Patient with pulse. Code called for immediate help to bedside. Noted patient DNR-limited. BVM in use. Mentation improved as O2 improved, back to baseline. Daughter at bedside and updated by primary MD.   86/75 (80)  HR palpable, strong *not on monitor RR agonal O2 60-70s  Interventions/Plan of Care:  BVM until sat increased and mentation improved MD to bedside Family updated by MD Mitts placed PRN morphine IV  133/87 (101) HR 149 RR 36 O2 92% HHFNC 50L 100% + NRB 15L  Event Summary:  MD NotifiedRudine Cos DO Call Time: 1135 Arrival Time: 1136 End Time: 1150  Ever Hiss, RN

## 2023-10-01 NOTE — Progress Notes (Signed)
   10/01/23 1154  Spiritual Encounters  Type of Visit Initial  Care provided to: Pt and family  Reason for visit Code  OnCall Visit Yes   Chaplain responded to Code Blue. Pt was stabilized and per brief conversation with Pt and her daughter, there is no spiritual need at this time.  Chaplain Alphonzo Ask

## 2023-10-01 NOTE — Progress Notes (Signed)
 Patient pulled HHFNC off.  Patient found to have agonal breathing. Patiient limited code. Patient bagged and then later placed on 100% NRB. Sats 92%. Patient able to follow commands. Family, MD, RN in room. Vitals stable. RT will continue to monitor.

## 2023-10-01 NOTE — Progress Notes (Signed)
 Patient's water  bag changed on HHFNC. No complications. Patient tolerated well. Patient family at bedside. RT will continue to monitor.

## 2023-10-02 DIAGNOSIS — Z515 Encounter for palliative care: Secondary | ICD-10-CM

## 2023-10-02 DIAGNOSIS — J189 Pneumonia, unspecified organism: Secondary | ICD-10-CM | POA: Diagnosis not present

## 2023-10-02 DIAGNOSIS — Z66 Do not resuscitate: Secondary | ICD-10-CM | POA: Diagnosis not present

## 2023-10-02 DIAGNOSIS — J9601 Acute respiratory failure with hypoxia: Secondary | ICD-10-CM | POA: Diagnosis not present

## 2023-10-02 DIAGNOSIS — G9341 Metabolic encephalopathy: Secondary | ICD-10-CM | POA: Diagnosis not present

## 2023-10-02 DIAGNOSIS — I951 Orthostatic hypotension: Secondary | ICD-10-CM | POA: Diagnosis not present

## 2023-10-02 DIAGNOSIS — A419 Sepsis, unspecified organism: Secondary | ICD-10-CM | POA: Diagnosis not present

## 2023-10-02 NOTE — Progress Notes (Signed)
 PROGRESS NOTE    LARAINE SAMET  QMV:[id number] DOB: [birth year] DOA: 09/29/2023 PCP: Marguerite Shiley, MD   Brief Narrative:  [name] is a 89 y.o. female who presents from nursing facility with medical history significant of hypertension, paroxysmal atrial fibrillation, chronic cystitis and likely undiagnosed dementia.  She presents from the facility with family with complaints of cough shortness of breath questionable hemoptysis, orthopnea and fever.  In the ED she was found to be profoundly tachycardic with hypoxia, imaging consistent with presumed pneumonia, hospitalist called for admission.   Assessment & Plan:   Principal Problem:   Sepsis due to pneumonia [facility]) Active Problems:   Acute metabolic encephalopathy   Chronic cystitis   Paroxysmal atrial fibrillation (HCC)   Chronic anticoagulation   Orthostatic hypotension   Anxiety   Hyperlipidemia with target LDL less than 130  Goals of care  - Discussed goals of care with both son and daughter on multiple occasions, both confirmed patient is DNR/DNI and would not want any heroic measures. - We discussed her high risk of decompensation given her worsening respiratory status over the last few hours requiring BiPAP support with profound tachycardia and ongoing mental status changes. - Family continues to be hopeful patient will recover but understands that should she continue to decline focusing on comfort measures is what she would want and we will do our best to comply with her wishes. - In regards for comfort will discontinue BiPAP and placed on heated high flow, continue IV Ativan  and morphine at low doses for respiratory support and anxiety - If patient improves over the next 24 to 48 hours discussed with palliative care to follow-up with family for ongoing planning moving forward - Patient continues to progress slowly, family wants to focus on comfort/quality of life - palliative to formally consult for possible  placement/disposition assistance. DC restraints per family wishes and utilize medications.  Acute on chronic respiratory failure with hypoxia Severe sepsis secondary to pneumonia Bilateral pleural effusion, small - Patient uses 2 L nasal cannula of oxygen  as needed at facility, overnight required BiPAP for respiratory support -now on heated high flow 60 L at 100% to maintain sats greater than 90 - Sepsis criteria met with tachycardia, tachypnea, notable source of pneumonia; mental status changes and lactic acidosis meet criteria for severe sepsis - Initially given IV steroids, broad-spectrum antibiotics, continue in the interim with p.o. prednisone , azithromycin  and ceftriaxone ; procalcitonin minimally elevated -CT ordered, overnight notable for scant bilateral effusion with profound bilateral pneumonia and groundglass opacity - Lasix  given overnight with minimal improvement in symptoms - Lactic downtrending with IV fluid/supplemental oxygen  and supportive care   Acute metabolic encephalopathy On likely underlying dementia -Secondary to above severe sepsis, hypoxia and lactic acidosis -High risk for delirium, sundowning.  Family educated at bedside -Continue supportive care, currently resting comfortably although unable to assess orientation   Chronic cystitis History of cephalosporin resistant UTI, on chronic Macrobid , cultures unremarkable, only 20k colony   Paroxysmal atrial fibrillation on chronic anticoagulation, -Improving with treatment of above, still tachycardic but <130(previously 150+) -Continue amiodarone  and Eliquis , if unable to tolerate p.o. will transition to IV amiodarone  and heparin    Heart failure with mildly reduced ejection fraction -Furosemide  overnight with no improvement clinically -Encourage p.o. intake - Echo 45-50% left ventricular hypokinesis globally - Strict I&O's and daily weights   Orthostatic hypotension -Chronic. At baseline patient has a history of  multiple syncopal episodes and near syncope  - Midodrine  being held in the  setting of hypertension, likely driven by agitation and hypoxia as above  Anxiety - Continue Lexapro  and Xanax  -As needed IV alprazolam  and morphine   Hyperlipidemia - Continue atorvastatin  DVT prophylaxis: apixaban  (ELIQUIS ) tablet 5 mg  Code Status:   Code Status: Limited: Do not attempt resuscitation (DNR) -DNR-LIMITED -Do Not Intubate/DNI  Family Communication: At bedside, son, and daughter  Status is: Inpatient  Dispo: The patient is from: Facility              Anticipated d/c is to: To be determined              Anticipated d/c date is: To be determined              Patient currently not medically stable for discharge  Consultants:  None  Procedures:  None  Antimicrobials:  Azithromycin , ceftriaxone   Subjective: No acute issues or events overnight, patient's respiratory status and heart rate appear to be improving slowly.  Objective: Vitals:   10/02/23 0000 10/02/23 0400 10/02/23 0500 10/02/23 0729  BP:  114/65  130/82  Pulse:    100  Resp:    20  Temp: 97.7 F (36.5 C) 97.8 F (36.6 C)  98.5 F (36.9 C)  TempSrc: Oral Oral  Oral  SpO2:      Weight:   71 kg     Intake/Output Summary (Last 24 hours) at 10/02/2023 0800 Last data filed at 10/02/2023 0453 Gross per 24 hour  Intake 720 ml  Output 300 ml  Net 420 ml   Filed Weights   09/30/23 0720 10/01/23 0716 10/02/23 0500  Weight: 66.8 kg 71.3 kg 71 kg    Examination:  General: Resting comfortably, tolerating heated high flow quite well Neck:  Without mass or deformity.  Trachea is midline. Lungs: Diffuse bilateral rhonchi with coarse breath sounds globally, tachypneic. Heart: Tachycardic, irregularly irregular Abdomen:  Soft, nontender, nondistended.  Without guarding or rebound. Extremities: Without cyanosis, clubbing, edema, or obvious deformity. Skin:  Warm and dry, no erythema. -Noted pressure ulcer on heel per staff,  see documentation    Data Reviewed: I have personally reviewed following labs and imaging studies  CBC: Recent Labs  Lab 09/29/23 0733 09/30/23 0324  WBC 15.3* 14.8*  NEUTROABS 12.4*  --   HGB 11.2* 10.6*  HCT 35.5* 33.5*  MCV 92.4 91.8  PLT 242 246   Basic Metabolic Panel: Recent Labs  Lab 09/29/23 0733 09/30/23 0324  NA 139 140  K 3.1* 4.2  CL 105 107  CO2 21* 23  GLUCOSE 149* 138*  BUN 27* 22  CREATININE 1.00 0.86  CALCIUM  8.2* 8.3*   GFR: Estimated Creatinine Clearance: 39.9 mL/min (by C-G formula based on SCr of 0.86 mg/dL).  Liver Function Tests: Recent Labs  Lab 09/29/23 0733  AST 30  ALT 14  ALKPHOS 48  BILITOT 0.9  PROT 7.1  ALBUMIN 2.8*   Coagulation Profile: Recent Labs  Lab 09/29/23 0733  INR 1.3*   Sepsis Labs: Recent Labs  Lab 09/29/23 0733 09/29/23 0748 09/29/23 1052 09/29/23 1334 09/29/23 1957 09/29/23 2239  PROCALCITON 0.77  --   --   --   --   --   LATICACIDVEN  --    < > 5.3* 5.7* 2.0* 2.0*   < > = values in this interval not displayed.    Recent Results (from the past 240 hours)  Resp panel by RT-PCR (RSV, Flu A&B, Covid) Anterior Nasal Swab     Status: None  Collection Time: 09/29/23  7:25 AM   Specimen: Anterior Nasal Swab  Result Value Ref Range Status   SARS Coronavirus 2 by RT PCR NEGATIVE NEGATIVE Final   Influenza A by PCR NEGATIVE NEGATIVE Final   Influenza B by PCR NEGATIVE NEGATIVE Final    Comment: (NOTE) The Xpert Xpress SARS-CoV-2/FLU/RSV plus assay is intended as an aid in the diagnosis of influenza from Nasopharyngeal swab specimens and should not be used as a sole basis for treatment. Nasal washings and aspirates are unacceptable for Xpert Xpress SARS-CoV-2/FLU/RSV testing.  Fact Sheet for Patients: BloggerCourse.com  Fact Sheet for Healthcare Providers: SeriousBroker.it  This test is not yet approved or cleared by the [place]  FDA and has  been authorized for detection and/or diagnosis of SARS-CoV-2 by FDA under an Emergency Use Authorization (EUA). This EUA will remain in effect (meaning this test can be used) for the duration of the COVID-19 declaration under Section 564(b)(1) of the Act, 21 U.S.C. section 360bbb-3(b)(1), unless the authorization is terminated or revoked.     Resp Syncytial Virus by PCR NEGATIVE NEGATIVE Final    Comment: (NOTE) Fact Sheet for Patients: BloggerCourse.com  Fact Sheet for Healthcare Providers: SeriousBroker.it  This test is not yet approved or cleared by the [place]  FDA and has been authorized for detection and/or diagnosis of SARS-CoV-2 by FDA under an Emergency Use Authorization (EUA). This EUA will remain in effect (meaning this test can be used) for the duration of the COVID-19 declaration under Section 564(b)(1) of the Act, 21 U.S.C. section 360bbb-3(b)(1), unless the authorization is terminated or revoked.  Performed at [facility], [address] [place], [place] [address]   Blood Culture (routine x 2)     Status: None (Preliminary result)   Collection Time: 09/29/23  7:25 AM   Specimen: BLOOD LEFT FOREARM  Result Value Ref Range Status   Specimen Description BLOOD LEFT FOREARM  Final   Special Requests   Final    BOTTLES DRAWN AEROBIC AND ANAEROBIC Blood Culture results may not be optimal due to an inadequate volume of blood received in culture bottles   Culture   Final    NO GROWTH 2 DAYS Performed at [facility], [address], [place], [place] [address]    Report Status PENDING  Incomplete  Blood Culture (routine x 2)     Status: None (Preliminary result)   Collection Time: 09/29/23  7:30 AM   Specimen: BLOOD  Result Value Ref Range Status   Specimen Description BLOOD RIGHT ANTECUBITAL  Final   Special Requests   Final    BOTTLES DRAWN AEROBIC AND ANAEROBIC Blood Culture results may not be  optimal due to an inadequate volume of blood received in culture bottles   Culture   Final    NO GROWTH 2 DAYS Performed at [facility], [address] [place], [place] [address]    Report Status PENDING  Incomplete  Urine Culture     Status: Abnormal   Collection Time: 09/29/23  8:12 AM   Specimen: Urine, Random  Result Value Ref Range Status   Specimen Description URINE, RANDOM  Final   Special Requests NONE Reflexed from [id number]  Final   Culture (A)  Final    20,000 COLONIES/mL LACTOBACILLUS SPECIES Standardized susceptibility testing for this organism is not available. Performed at [facility], [address], [place], [place] [address]    Report Status 09/30/2023 FINAL  Final  MRSA Next  Gen by PCR, Nasal     Status: None   Collection Time: 09/29/23 10:41 AM   Specimen: Nasal Mucosa; Nasal Swab  Result Value Ref Range Status   MRSA by PCR Next Gen NOT DETECTED NOT DETECTED Final    Comment: (NOTE) The GeneXpert MRSA Assay (FDA approved for NASAL specimens only), is one component of a comprehensive MRSA colonization surveillance program. It is not intended to diagnose MRSA infection nor to guide or monitor treatment for MRSA infections. Test performance is not FDA approved in patients less than 75 years old. Performed at [facility], [address], [place], [place] [address]          Radiology Studies: No results found.  Scheduled Meds:  ALPRAZolam   0.25 mg Oral QHS   amiodarone   100 mg Oral q morning   apixaban   5 mg Oral BID   escitalopram   20 mg Oral Daily   guaiFENesin   600 mg Oral BID   lactase  9,000 Units Oral TID   mirtazapine   30 mg Oral QHS   nitrofurantoin  (macrocrystal-monohydrate)  100 mg Oral QHS   pravastatin   40 mg Oral q1800   predniSONE   40 mg Oral Q breakfast   psyllium  1 packet Oral Daily   saccharomyces boulardii  250 mg Oral BID   sodium chloride  flush  3 mL Intravenous Q12H   Continuous Infusions:  cefTRIAXone   (ROCEPHIN )  IV 200 mL/hr at 10/01/23 1644     LOS: 3 days   Time spent: spent at bedside, discussing case with family in regards to goals of care, palliative care as well as current clinical status  Haydee Lipa, DO [facility]  If 7PM-7AM, please contact night-coverage [email or web address]  10/02/2023, 8:00 AM

## 2023-10-02 NOTE — Consult Note (Signed)
 Consultation Note Date: 10/02/2023   Patient Name: [name]  DOB: 1930-11-30  MRN: [id number]  Age / Sex: 89 y.o., female  PCP: Marguerite Shiley, MD Referring Physician: Haydee Lipa, MD  Reason for Consultation: Establishing goals of care  HPI/Patient Profile: 89 y.o. female   admitted on 09/29/2023   from nursing facility with past medical history significant of hypertension, paroxysmal atrial fibrillation, chronic cystitis and likely undiagnosed dementia.  She presents from the facility with family with complaints of cough shortness of breath questionable hemoptysis, orthopnea and fever.  In the ED she was found to be profoundly tachycardic with hypoxia, imaging consistent with presumed pneumonia.  Admitted for treatment and stabilization.  Patient and family face treatment option decisions advanced directive decisions and anticipatory care needs    Clinical Assessment and Goals of Care:  This NP Thena Fireman reviewed medical records, received report from team, assessed the patient and then meet at the patient's bedside  to discuss diagnosis, prognosis, GOC, EOL wishes disposition and options.   Concept of Palliative Care was introduced as specialized medical care for people and their families living with serious illness.  If focuses on providing relief from the symptoms and stress of a serious illness.  The goal is to improve quality of life for both the patient and the family.Values and goals of care important to patient and family were attempted to be elicited.  I was able to speak to patient's son Dr. Raynald Calkins and his wife Angie by telephone. Later in the afternoon I spoke to patient's daughter/Janice Sibyl Drafts telephone  All family members verbalized an understanding of the seriousness of patient's current medical situation and the likely associated limited prognosis.  Ultimately hope is for  comfort and dignity at end-of-life.   A  discussion was had today regarding advanced directives.  Concepts specific to code status, artifical feeding and hydration, continued IV antibiotics and rehospitalization was had.    The difference between a aggressive medical intervention path  and a palliative comfort care path for this patient at this time was had.      MOST form, Hard Choices booklet and PMT contact information left in the room    Natural trajectory and expectations at EOL were discussed.  Questions and concerns addressed.  Patient  encouraged to call with questions or concerns.     PMT will continue to support holistically.   Patient's son and daughter make healthcare decisions as unit      SUMMARY OF RECOMMENDATIONS    -DNR/DNI, no BiPAP -Comfort and dignity are priority of care - Continue current interventions with no escalation of care----do not increase oxygen -utilize medication for symptom management -If patient decompensates-focus of care is comfort allowing for natural death - Meet tomorrow morning at 10 AM with family for further delineation of goals of care--likely shift to full comfort at that time  Code Status/Advance Care Planning: DNR/DNI, no BiPAP   Symptom Management:  Dyspnea/air hunger/pain : Morphine 1 mg IV every 2 hours as needed  Palliative Prophylaxis:  Aspiration, Delirium Protocol, and Frequent Pain Assessment   Psycho-social/Spiritual:  Desire for further Chaplaincy support:no-family declines Additional Recommendations: Education on Hospice  Prognosis:  Hours - Days  Discharge Planning: To Be Determined      Primary Diagnoses: Present on Admission:  Sepsis due to pneumonia (HCC)  Acute metabolic encephalopathy  Chronic cystitis  Paroxysmal atrial fibrillation (HCC)  Orthostatic hypotension  Anxiety  Hyperlipidemia with target LDL less than 130   I have reviewed the medical record, interviewed the patient and family, and  examined the patient. The following aspects are pertinent.  Past Medical History:  Diagnosis Date   Allergic rhinitis    Arthritis    hands, little in my feet (06/22/2016)   Candidiasis of vulva and vagina    Cystocele, midline    Disorder of bone and cartilage, unspecified    Fibroids    Hypertension    Large hiatal hernia 06/23/2016   Migraine    none in the last few years (06/22/2016)   Osteopenia    Other chronic cystitis    Other sign and symptom in breast    Paroxysmal atrial fibrillation (HCC) 11/30/2012   Pneumonia    I've had walking pneumonia a couple times  (06/22/2016)   Pneumonia dx'd 06/15/2016   Rectal incontinence    Stroke (HCC) 2014   hx of mini stroke    TIA (transient ischemic attack) 2015   Social History   Socioeconomic History   Marital status: Widowed    Spouse name: Not on file   Number of children: 2   Years of education: Not on file   Highest education level: Not on file  Occupational History   Not on file  Tobacco Use   Smoking status: Never   Smokeless tobacco: Never   Tobacco comments:    Never smoke 10/01/21  Vaping Use   Vaping status: Never Used  Substance and Sexual Activity   Alcohol  use: Yes    Alcohol /week: 1.0 standard drink of alcohol     Types: 1 Glasses of wine per week    Comment: one glass of wine once a month 10/01/21   Drug use: No   Sexual activity: Not Currently  Other Topics Concern   Not on file  Social History Narrative      Past profession was a Futures trader, Diplomatic Services operational officer, and Engineer, technical sales.   Exercise by walking everyday.    Patient has a living will. POA and DNR.    Social Drivers of Corporate investment banker Strain: Low Risk  (12/14/2020)   Overall Financial Resource Strain (CARDIA)    Difficulty of Paying Living Expenses: Not hard at all  Food Insecurity: No Food Insecurity (10/01/2023)   Hunger Vital Sign    Worried About Running Out of Food in the Last Year: Never true    Ran Out of Food in the Last Year:  Never true  Transportation Needs: No Transportation Needs (10/01/2023)   PRAPARE - Administrator, Civil Service (Medical): No    Lack of Transportation (Non-Medical): No  Physical Activity: Insufficiently Active (12/14/2020)   Exercise Vital Sign    Days of Exercise per Week: 7 days    Minutes of Exercise per Session: 10 min  Stress: Stress Concern Present (12/14/2020)   [employer] of Occupational Health - Occupational Stress Questionnaire    Feeling of Stress : To some extent  Social Connections: Unknown (10/01/2023)   Social Connection and Isolation Panel    Frequency  of Communication with Friends and Family: Patient unable to answer    Frequency of Social Gatherings with Friends and Family: Patient unable to answer    Attends Religious Services: Patient unable to answer    Active Member of Clubs or Organizations: Patient unable to answer    Attends Banker Meetings: Patient unable to answer    Marital Status: Widowed   Family History  Problem Relation Age of Onset   Cancer Mother    Hypertension Mother    Heart disease Father    Heart disease Brother    Emphysema Brother    Scheduled Meds:  ALPRAZolam   0.25 mg Oral QHS   amiodarone   100 mg Oral q morning   apixaban   5 mg Oral BID   escitalopram   20 mg Oral Daily   guaiFENesin   600 mg Oral BID   lactase  9,000 Units Oral TID   mirtazapine   30 mg Oral QHS   nitrofurantoin  (macrocrystal-monohydrate)  100 mg Oral QHS   pravastatin   40 mg Oral q1800   predniSONE   40 mg Oral Q breakfast   psyllium  1 packet Oral Daily   saccharomyces boulardii  250 mg Oral BID   sodium chloride  flush  3 mL Intravenous Q12H   Continuous Infusions:  cefTRIAXone  (ROCEPHIN )  IV 2 g (10/02/23 0906)   PRN Meds:.acetaminophen  **OR** acetaminophen , albuterol , diclofenac Sodium, LORazepam , midodrine , morphine injection, ondansetron  **OR** ondansetron  (ZOFRAN ) IV Medications Prior to Admission:  Prior to Admission  medications   Medication Sig Start Date End Date Taking? Authorizing Provider  acetaminophen  (TYLENOL ) 500 MG tablet Take 500 mg by mouth in the morning, at noon, and at bedtime.   Yes [provider]  acetaminophen  (TYLENOL ) 500 MG tablet Take 500-1,000 mg by mouth See admin instructions. Take 500 mg by mouth every 6 hours as needed for pain. 2nd order: take 1000 mg by mouth twice daily as needed for pain   Yes [provider]  ALPRAZolam  (XANAX ) 0.25 MG tablet Take 1 tablet (0.25 mg total) by mouth at bedtime. 05/13/23  Yes Wert, Craige Dixon, NP  amiodarone  (PACERONE ) 100 MG tablet Take 100 mg by mouth every morning.   Yes [provider]  CALCIUM  CARBONATE-VIT D-MIN PO Take 2 tablets by mouth every Monday, Wednesday, and Friday.   Yes [provider]  cetirizine (ZYRTEC) 10 MG tablet Take 10 mg by mouth daily as needed for allergies.   Yes [provider]  CRANBERRY PO Take 450 mg by mouth daily.   Yes [provider]  diclofenac Sodium (VOLTAREN ARTHRITIS PAIN) 1 % GEL Apply 1 Application topically as needed (pain).   Yes [provider]  docusate sodium (COLACE) 100 MG capsule Take 100 mg by mouth daily as needed for mild constipation or moderate constipation.   Yes [provider]  doxycycline  (VIBRA -TABS) 100 MG tablet Take 1 tablet (100 mg total) by mouth 2 (two) times daily for 7 days. 09/27/23 10/04/23 Yes Fargo, Amy E, NP  ELIQUIS  5 MG TABS tablet TAKE 1 TABLET BY MOUTH TWICE DAILY 06/14/22  Yes [place], [name], MD  escitalopram  (LEXAPRO ) 20 MG tablet Take 20 mg by mouth daily.   Yes [provider]  estradiol  (ESTRACE ) 0.1 MG/GM vaginal cream Place 1 Applicatorful vaginally every Monday, Wednesday, and Friday. 08/22/20  Yes [provider]  glucosamine-chondroitin 500-400 MG tablet Take 1 tablet by mouth in the morning and at bedtime.   Yes [provider]  guaiFENesin  (MUCINEX ) 600  MG 12 hr tablet  Take 1 tablet (600 mg total) by mouth 2 (two) times daily for 5 days. 09/28/23 09/25/2023 Yes Fargo, Amy E, NP  ipratropium-albuterol  (DUONEB) 0.5-2.5 (3) MG/3ML SOLN Take 3 mLs by nebulization 3 (three) times daily for 3 days. 09/28/23 10/01/23 Yes Fargo, Amy E, NP  Lactase 9000 units TABS Take 9,000 Units by mouth 3 (three) times daily.   Yes [provider]  lidocaine  4 % Place 1 patch onto the skin daily as needed (for back/shoulder pain).   Yes [provider]  loperamide  (IMODIUM  A-D) 2 MG tablet Take 2 mg by mouth as needed for diarrhea or loose stools.   Yes [provider]  midodrine  (PROAMATINE ) 5 MG tablet Take 5 mg by mouth 2 (two) times daily as needed. Hold if SBP is >110   Yes [provider]  mirtazapine  (REMERON ) 30 MG tablet Take 1 tablet (30 mg total) by mouth at bedtime. 12/09/22  Yes Wert, Craige Dixon, NP  nitrofurantoin , macrocrystal-monohydrate, (MACROBID ) 100 MG capsule Take 100 mg by mouth at bedtime. 05/29/20  Yes [provider]  OXYGEN  Inhale 2 L into the lungs as needed (to keep stats above 90 %).   Yes [provider]  pravastatin  (PRAVACHOL ) 40 MG tablet TAKE 1 TABLET(40 MG) BY MOUTH DAILY 08/30/22  Yes Marguerite Shiley, MD  predniSONE  (DELTASONE ) 20 MG tablet Take 20 mg by mouth daily with breakfast.   Yes [provider]  Psyllium (METAMUCIL) WAFR Take 2 Wafers by mouth daily.   Yes [provider]   Allergies  Allergen Reactions   Augmentin  [Amoxicillin -Pot Clavulanate]    Lactose Other (See Comments)    Dairy Sensitivity      Lisinopril Other (See Comments)    DIZZY   Other     SEASONAL ALLERGIES   Bactrim [Sulfamethoxazole-Trimethoprim] Rash   Review of Systems  Unable to perform ROS: Acuity of condition    Physical Exam Constitutional:      Appearance: She is underweight. She is ill-appearing.     Interventions: Nasal cannula in place.   Cardiovascular:     Rate and Rhythm: Tachycardia  present.  Pulmonary:     Effort: Tachypnea present.   Skin:    General: Skin is warm and dry.   Neurological:     Mental Status: She is lethargic.     Vital Signs: BP 130/82 (BP Location: Left Arm)   Pulse 100   Temp 98.5 F (36.9 C) (Oral)   Resp 20   Wt 71 kg   SpO2 98%   BMI 25.26 kg/m  Pain Scale: 0-10 POSS *See Group Information*: 1-Acceptable,Awake and alert Pain Score: Asleep   SpO2: SpO2: 98 % O2 Device:SpO2: 98 % O2 Flow Rate: .O2 Flow Rate (L/min): 30 L/min  IO: Intake/output summary:  Intake/Output Summary (Last 24 hours) at 10/02/2023 1115 Last data filed at 10/02/2023 0453 Gross per 24 hour  Intake 270 ml  Output 300 ml  Net -30 ml    LBM: Last BM Date : 09/29/23 Baseline Weight: Weight: 66.8 kg Most recent weight: Weight: 71 kg     Palliative Assessment/Data:  30%      Time 75 minutes  Discussed with Dr. Rudine Cos  Signed by: Thena Fireman, NP   Please contact Palliative Medicine Team phone at [phone] for questions and concerns.  For individual provider: See Tilford Foley

## 2023-10-03 DIAGNOSIS — R451 Restlessness and agitation: Secondary | ICD-10-CM | POA: Diagnosis not present

## 2023-10-03 DIAGNOSIS — Z515 Encounter for palliative care: Secondary | ICD-10-CM | POA: Diagnosis not present

## 2023-10-03 DIAGNOSIS — J189 Pneumonia, unspecified organism: Secondary | ICD-10-CM | POA: Diagnosis not present

## 2023-10-03 DIAGNOSIS — A419 Sepsis, unspecified organism: Secondary | ICD-10-CM | POA: Diagnosis not present

## 2023-10-03 MED ORDER — LORAZEPAM 2 MG/ML IJ SOLN
1.0000 mg | INTRAMUSCULAR | Status: DC | PRN
  Filled 2023-10-03: qty 1

## 2023-10-03 MED ORDER — MORPHINE SULFATE (PF) 2 MG/ML IV SOLN
2.0000 mg | INTRAVENOUS | Status: DC | PRN
  Filled 2023-10-03: qty 1

## 2023-10-03 MED ORDER — LORAZEPAM 2 MG/ML IJ SOLN: 2.0000 mg | INTRAMUSCULAR | Status: DC | PRN

## 2023-10-03 MED ORDER — MORPHINE 100MG IN NS 100ML (1MG/ML) PREMIX INFUSION
5.0000 mg/h | INTRAVENOUS | Status: DC
  Filled 2023-10-03: qty 100

## 2023-10-03 MED ORDER — LORAZEPAM 2 MG/ML IJ SOLN
2.0000 mg | INTRAMUSCULAR | Status: DC | PRN
  Filled 2023-10-03: qty 1

## 2023-10-03 MED ORDER — MORPHINE SULFATE (PF) 2 MG/ML IV SOLN
1.0000 mg | INTRAVENOUS | Status: DC | PRN
  Administered 2023-10-03: 1 mg via INTRAVENOUS
  Filled 2023-10-03 (×3): qty 1

## 2023-10-04 LAB — CULTURE, BLOOD (ROUTINE X 2)
Culture: NO GROWTH
Culture: NO GROWTH

## 2023-10-18 NOTE — Plan of Care (Signed)

## 2023-10-18 NOTE — Death Summary Note (Signed)
 DEATH SUMMARY   Patient Details  Name: [name] MRN: [id number] DOB: 03-30-1931 LKG:[id number], Pressley Brome, MD Admission/Discharge Information   Admit Date:  2023/10/07  Date of Death: Date of Death: 10/11/2023  Time of Death: Time of Death: Oct 16, 1728  Length of Stay: 4   Principle Cause of death:  Acute on chronic respiratory failure with hypoxia Severe sepsis secondary to pneumonia Bilateral pleural effusion, small  Hospital Diagnoses: Principal Problem:   Sepsis due to pneumonia [facility]) Active Problems:   Acute metabolic encephalopathy   Chronic cystitis   Paroxysmal atrial fibrillation (HCC)   Chronic anticoagulation   Orthostatic hypotension   Anxiety   Hyperlipidemia with target LDL less than 130   Hospital Course: [name] is a 89 y.o. female who presents from nursing facility with medical history significant of hypertension, paroxysmal atrial fibrillation, chronic cystitis and likely undiagnosed dementia.  She presents from the facility with family with complaints of cough shortness of breath questionable hemoptysis, orthopnea and fever.  In the ED she was found to be profoundly tachycardic with hypoxia, imaging consistent with presumed pneumonia, hospitalist called for admission.  Patient admitted with acute hypoxic respiratory failure in the setting of pneumonia, patient met sepsis criteria initially converting to severe sepsis given mental status changes.  Lengthy discussion with patient and family daily, given her worsening respiratory status despite maximum available therapies she continued to deteriorate.  Patient's remains quite somnolent despite interaction with family.  Given her ongoing worsening respiratory status, infection and mental status decision was made to transition to comfort measures under palliative care on 11-Oct-2023.  Concurrent with their wishes patient's oxygen  was weaned, patient was medicated when noted to be dyspneic or in pain.  Patient continued to  decline and died at 83 on October 11, 2023.    Assessment/Plan Acute on chronic respiratory failure with hypoxia Severe sepsis secondary to pneumonia Bilateral pleural effusion, small Acute metabolic encephalopathy On likely underlying dementia Chronic cystitis Paroxysmal atrial fibrillation on chronic anticoagulation, Heart failure with mildly reduced ejection fraction Orthostatic hypotension Anxiety Hyperlipidemia - Transitioned to comfort measures, treatment/intervention focused on comfort/quality of life.   Procedures: None  Consultations: Palliative care  The results of significant diagnostics from this hospitalization (including imaging, microbiology, ancillary and laboratory) are listed below for reference.   Significant Diagnostic Studies: DG Chest Port 1 View Result Date: 09/30/2023 CLINICAL DATA:  [id number] with shortness of breath. EXAM: PORTABLE CHEST 1 VIEW COMPARISON:  Portable chest yesterday at 7:35 a.m. FINDINGS: 5:30 a.m. Today there is interval worsening of right greater than left patchy airspace disease, which is increasingly dense and more confluent on both sides, but again with left apical sparing. Small pleural effusions have also increased. There could be an edematous component. The heart is enlarged and there is increased fullness in the central vasculature. There is aortic atherosclerosis with stable mediastinum. No new osseous findings. IMPRESSION: 1. Interval worsening of right greater than left patchy airspace disease, which is increasingly dense and more confluent on both sides, but again with left apical sparing. 2. Small pleural effusions have also increased. 3. Cardiomegaly with increased fullness in the central vasculature. Electronically Signed   By: Denman Fischer M.D.   On: 09/30/2023 05:43   CT CHEST WO CONTRAST Result Date: 10-07-23 CLINICAL DATA:  Hemoptysis and shortness of breath EXAM: CT CHEST WITHOUT CONTRAST TECHNIQUE: Multidetector CT imaging of  the chest was performed following the standard protocol without IV contrast. RADIATION DOSE REDUCTION: This exam was performed according to  the departmental dose-optimization program which includes automated exposure control, adjustment of the mA and/or kV according to patient size and/or use of iterative reconstruction technique. COMPARISON:  Chest x-ray from earlier in the same day. FINDINGS: Cardiovascular: Somewhat limited due to lack of IV contrast. Atherosclerotic calcifications of the thoracic aorta are noted. Ascending aorta measures 4 cm. No enlargement of the pulmonary artery is seen. Coronary calcifications are noted. Mediastinum/Nodes: Thoracic inlet is within normal limits. Mediastinal adenopathy is noted particularly in the right paratracheal region measuring up to 10 mm in short axis. AP window node is seen measuring 12 mm in short axis. These changes are likely reactive in nature given the lung findings Lungs/Pleura: Lungs are well aerated bilaterally. Diffuse infiltrate is noted throughout the majority of the right lung similar to that seen on the prior exam. Patchy infiltrates are seen within the left upper lobe. Small effusions are seen bilaterally. Upper Abdomen: Visualized upper abdomen shows no acute abnormality. Musculoskeletal: No chest wall mass or suspicious bone lesions identified. IMPRESSION: Patchy airspace opacities right greater than left similar to that seen on prior chest x-ray. Reactive adenopathy in the mediastinum is seen. Small effusions bilaterally. Dilatation of the ascending aorta to 4 cm. Follow-up can be performed as clinically indicated given the patient's advanced age. Aortic Atherosclerosis (ICD10-I70.0). Electronically Signed   By: Violeta Grey M.D.   On: 09/29/2023 19:49   ECHOCARDIOGRAM COMPLETE Result Date: 09/29/2023    ECHOCARDIOGRAM REPORT   Patient Name:   [name] Date of Exam: 09/29/2023 Medical Rec #:  [id number]     Height:       66.0 in Accession #:     [id number]    Weight:       147.5 lb Date of Birth:  Apr 11, 1931      BSA:          1.757 m Patient Age:    89 years      BP:           140/70 mmHg Patient Gender: F             HR:           84 bpm. Exam Location:  Inpatient Procedure: 2D Echo, Color Doppler and Cardiac Doppler (Both Spectral and Color            Flow Doppler were utilized during procedure). Indications:    CHF  History:        Patient has prior history of Echocardiogram examinations. CHF.  Sonographer:    Farrell Honey Key Referring Phys: [id number] RONDELL A SMITH IMPRESSIONS  1. Left ventricular ejection fraction, by estimation, is 45 to 50%. The left ventricle has mildly decreased function. The left ventricle demonstrates global hypokinesis. There is mild concentric left ventricular hypertrophy. Left ventricular diastolic function could not be evaluated.  2. Right ventricular systolic function is normal. The right ventricular size is normal.  3. Left atrial size was moderately dilated.  4. Right atrial size was severely dilated.  5. The mitral valve is abnormal. Trivial mitral valve regurgitation. Severe mitral annular calcification.  6. The aortic valve is tricuspid. There is mild calcification of the aortic valve. Aortic valve regurgitation is mild. No aortic stenosis is present.  7. The inferior vena cava is dilated in size with <50% respiratory variability, suggesting right atrial pressure of 15 mmHg. Comparison(s): No significant change from prior study. FINDINGS  Left Ventricle: Left ventricular ejection fraction, by estimation, is 45 to 50%. The left ventricle has  mildly decreased function. The left ventricle demonstrates global hypokinesis. The left ventricular internal cavity size was normal in size. There is  mild concentric left ventricular hypertrophy. Left ventricular diastolic function could not be evaluated due to mitral annular calcification (moderate or greater). Left ventricular diastolic function could not be evaluated. Right Ventricle:  The right ventricular size is normal. No increase in right ventricular wall thickness. Right ventricular systolic function is normal. Left Atrium: Left atrial size was moderately dilated. Right Atrium: Right atrial size was severely dilated. Pericardium: Trivial pericardial effusion is present. Mitral Valve: The mitral valve is abnormal. There is mild thickening of the mitral valve leaflet(s). There is moderate calcification of the mitral valve leaflet(s). Severe mitral annular calcification. Trivial mitral valve regurgitation. MV peak gradient, 6.6 mmHg. The mean mitral valve gradient is 4.0 mmHg. Tricuspid Valve: The tricuspid valve is normal in structure. Tricuspid valve regurgitation is mild . No evidence of tricuspid stenosis. Aortic Valve: The aortic valve is tricuspid. There is mild calcification of the aortic valve. Aortic valve regurgitation is mild. No aortic stenosis is present. Pulmonic Valve: The pulmonic valve was grossly normal. Pulmonic valve regurgitation is not visualized. No evidence of pulmonic stenosis. Aorta: The aortic root and ascending aorta are structurally normal, with no evidence of dilitation. Venous: The inferior vena cava is dilated in size with less than 50% respiratory variability, suggesting right atrial pressure of 15 mmHg. IAS/Shunts: The atrial septum is grossly normal.  LEFT VENTRICLE PLAX 2D LVIDd:         4.10 cm      Diastology LVIDs:         2.20 cm      LV e' medial:    5.22 cm/s LV PW:         1.20 cm      LV E/e' medial:  25.3 LV IVS:        1.20 cm      LV e' lateral:   9.68 cm/s LVOT diam:     2.20 cm      LV E/e' lateral: 13.6 LV SV:         80 LV SV Index:   46 LVOT Area:     3.80 cm  LV Volumes (MOD) LV vol d, MOD A2C: 100.3 ml LV vol d, MOD A4C: 112.3 ml LV vol s, MOD A2C: 56.3 ml LV vol s, MOD A4C: 59.4 ml LV SV MOD A2C:     44.0 ml LV SV MOD A4C:     112.3 ml LV SV MOD BP:      49.8 ml RIGHT VENTRICLE RV Basal diam:  3.50 cm RV S prime:     11.90 cm/s TAPSE  (M-mode): 1.9 cm LEFT ATRIUM             Index        RIGHT ATRIUM           Index LA diam:        3.70 cm 2.11 cm/m   RA Area:     28.60 cm LA Vol (A2C):   87.0 ml 49.51 ml/m  RA Volume:   104.05 ml 59.22 ml/m LA Vol (A4C):   76.3 ml 43.42 ml/m LA Biplane Vol: 82.3 ml 46.84 ml/m  AORTIC VALVE LVOT Vmax:   108.00 cm/s LVOT Vmean:  75.500 cm/s LVOT VTI:    0.211 m  AORTA Ao Root diam: 3.60 cm Ao Asc diam:  3.60 cm MITRAL VALVE MV Area (PHT): 5.84 cm  SHUNTS MV Area VTI:   2.51 cm     Systemic VTI:  0.21 m MV Peak grad:  6.6 mmHg     Systemic Diam: 2.20 cm MV Mean grad:  4.0 mmHg MV Vmax:       1.28 m/s MV Vmean:      100.0 cm/s MV Decel Time: 130 msec MV E velocity: 132.00 cm/s MV A velocity: 104.00 cm/s MV E/A ratio:  1.27 Sheryle Donning MD Electronically signed by Sheryle Donning MD Signature Date/Time: 09/29/2023/7:46:06 PM    Final    DG Chest Port 1 View Result Date: 09/29/2023 CLINICAL DATA:  Questionable sepsis. EXAM: PORTABLE CHEST 1 VIEW COMPARISON:  Portable chest 01/19/2022 FINDINGS: There is widespread patchy airspace disease on the right-greater-than-left, relatively sparing the apical left lung. Findings are most likely due to multilobar pneumonia in this clinical setting. Small pleural effusions are beginning to form. There is mild cardiomegaly with normal caliber of the central vessels. There is aortic atherosclerosis with stable mediastinum. Interstitial thickening underlies some of the opacities and could be infectious or due to edema. There are degenerative changes of the spine shoulders with chronic rotator cuff arthropathy. IMPRESSION: 1. Widespread patchy airspace disease on the right-greater-than-left, most likely due to multilobar pneumonia in this clinical setting. 2. Small pleural effusions are beginning to form. 3. Interstitial thickening underlies some of the opacities and could be infectious or due to edema. 4. Aortic atherosclerosis. Electronically Signed   By:  Denman Fischer M.D.   On: 09/29/2023 07:46    Microbiology: Recent Results (from the past 240 hours)  Resp panel by RT-PCR (RSV, Flu A&B, Covid) Anterior Nasal Swab     Status: None   Collection Time: 09/29/23  7:25 AM   Specimen: Anterior Nasal Swab  Result Value Ref Range Status   SARS Coronavirus 2 by RT PCR NEGATIVE NEGATIVE Final   Influenza A by PCR NEGATIVE NEGATIVE Final   Influenza B by PCR NEGATIVE NEGATIVE Final    Comment: (NOTE) The Xpert Xpress SARS-CoV-2/FLU/RSV plus assay is intended as an aid in the diagnosis of influenza from Nasopharyngeal swab specimens and should not be used as a sole basis for treatment. Nasal washings and aspirates are unacceptable for Xpert Xpress SARS-CoV-2/FLU/RSV testing.  Fact Sheet for Patients: BloggerCourse.com  Fact Sheet for Healthcare Providers: SeriousBroker.it  This test is not yet approved or cleared by the [place]  FDA and has been authorized for detection and/or diagnosis of SARS-CoV-2 by FDA under an Emergency Use Authorization (EUA). This EUA will remain in effect (meaning this test can be used) for the duration of the COVID-19 declaration under Section 564(b)(1) of the Act, 21 U.S.C. section 360bbb-3(b)(1), unless the authorization is terminated or revoked.     Resp Syncytial Virus by PCR NEGATIVE NEGATIVE Final    Comment: (NOTE) Fact Sheet for Patients: BloggerCourse.com  Fact Sheet for Healthcare Providers: SeriousBroker.it  This test is not yet approved or cleared by the [place]  FDA and has been authorized for detection and/or diagnosis of SARS-CoV-2 by FDA under an Emergency Use Authorization (EUA). This EUA will remain in effect (meaning this test can be used) for the duration of the COVID-19 declaration under Section 564(b)(1) of the Act, 21 U.S.C. section 360bbb-3(b)(1), unless the authorization  is terminated or revoked.  Performed at [facility], [address], [place], [place] [address]   Blood Culture (routine x 2)     Status: None (Preliminary result)   Collection Time: 09/29/23  7:25 AM   Specimen: BLOOD LEFT FOREARM  Result Value Ref Range Status   Specimen Description BLOOD LEFT FOREARM  Final   Special Requests   Final    BOTTLES DRAWN AEROBIC AND ANAEROBIC Blood Culture results may not be optimal due to an inadequate volume of blood received in culture bottles   Culture   Final    NO GROWTH 4 DAYS Performed at [facility], [address] [place], [place] [address]    Report Status PENDING  Incomplete  Blood Culture (routine x 2)     Status: None (Preliminary result)   Collection Time: 09/29/23  7:30 AM   Specimen: BLOOD  Result Value Ref Range Status   Specimen Description BLOOD RIGHT ANTECUBITAL  Final   Special Requests   Final    BOTTLES DRAWN AEROBIC AND ANAEROBIC Blood Culture results may not be optimal due to an inadequate volume of blood received in culture bottles   Culture   Final    NO GROWTH 4 DAYS Performed at [facility], [address], [place], [place] [address]    Report Status PENDING  Incomplete  Urine Culture     Status: Abnormal   Collection Time: 09/29/23  8:12 AM   Specimen: Urine, Random  Result Value Ref Range Status   Specimen Description URINE, RANDOM  Final   Special Requests NONE Reflexed from [id number]  Final   Culture (A)  Final    20,000 COLONIES/mL LACTOBACILLUS SPECIES Standardized susceptibility testing for this organism is not available. Performed at [facility], [address], [place], [place] [address]    Report Status 09/30/2023 FINAL  Final  MRSA Next Gen by PCR, Nasal     Status: None   Collection Time: 09/29/23 10:41 AM   Specimen: Nasal Mucosa; Nasal Swab  Result Value Ref Range Status   MRSA by PCR Next Gen NOT DETECTED NOT DETECTED Final    Comment: (NOTE) The GeneXpert MRSA Assay  (FDA approved for NASAL specimens only), is one component of a comprehensive MRSA colonization surveillance program. It is not intended to diagnose MRSA infection nor to guide or monitor treatment for MRSA infections. Test performance is not FDA approved in patients less than 41 years old. Performed at [facility], [address], [place], [place] [address]     Time spent: 55 minutes  Signed: Diego Foy DO 09/21/2023

## 2023-10-18 NOTE — Progress Notes (Signed)
 Patient ID: [name], female   DOB: 1930/06/09, 89 y.o.   MRN: [id number]    Progress Note from the Palliative Medicine Team at [facility]   Patient Name: [name]        Date: 09/22/2023 DOB: 10-02-1930  Age: 89 y.o. MRN#: [id number] Attending Physician: Haydee Lipa, MD Primary Care Physician: Marguerite Shiley, MD Admit Date: 09/29/2023   Reason for Consultation/Follow-up   Establishing Goals of Care   HPI/ Brief Hospital Review  89 y.o. female   admitted on 09/29/2023   from nursing facility with past medical history significant of hypertension, paroxysmal atrial fibrillation, chronic cystitis and likely undiagnosed dementia.  She presents from the facility with family with complaints of cough shortness of breath questionable hemoptysis, orthopnea and fever.  In the ED she was found to be profoundly tachycardic with hypoxia, imaging consistent with presumed pneumonia.   Admitted for treatment and stabilization.   Patient and family face treatment option decisions advanced directive decisions and anticipatory care needs    Subjective  Extensive chart review has been completed prior to meeting with patient/family  including labs, vital signs, imaging, progress/consult notes, orders, medications and available advance directive documents.    This NP assessed patient at the bedside as a follow up for palliative medicine needs and emotional support and to meet with family as scheduled for further delineation of goals of care.  Daughter/[name] and her husband are at bedside along with son/Dr [name] and his wife by phone.  Continued education regarding current medical situation and associated poor prognosis.  Family understand the seriousness of current medical situation and hope is for comfort and dignity at end-of-life.  Education offered on natural trajectory and expectation at end-of-life.  Progosis is likely hours to days, expect hospital death  Plan of  Care: - DNR/DNI - No artificial feeding or hydration now or in the future - No further diagnostics; labs, scans - DC IV antibiotics - Symptom management:         -Dyspnea/air hunger: Morphine 2 mg IV every 30 minutes as needed         -Agitation: Ativan  2 mg every 3 hours as needed - Emotional support offered - Expect a hospital death    Questions and concerns addressed   Discussed with primary team and nursing staff   Time: 50 minutes  Detailed review of medical records ( labs, imaging, vital signs), medically appropriate exam ( MS, skin, cardiac,  resp)   discussed with treatment team, counseling and education to patient, family, staff, documenting clinical information, medication management, coordination of care    Thena Fireman NP  Palliative Medicine Team Team Phone # [phone] Pager [phone]

## 2023-10-18 NOTE — Progress Notes (Signed)
   10/04/2023 1620  TOC Brief Assessment  Insurance and Status Reviewed  Patient has primary care physician Yes  Home environment has been reviewed Lives at [facility]  Prior level of function: self  Prior/Current Home Services No current home services  Social Drivers of Health Review SDOH reviewed no interventions necessary  Readmission risk has been reviewed Yes  Transition of care needs no transition of care needs at this time    Noted plan for Full Comfort Care.

## 2023-10-18 NOTE — Progress Notes (Signed)
 PROGRESS NOTE    [name]  AOZ:[id number] DOB: 1930/10/25 DOA: 09/29/2023 PCP: Marguerite Shiley, MD   Brief Narrative:  [name] is a 89 y.o. female who presents from nursing facility with medical history significant of hypertension, paroxysmal atrial fibrillation, chronic cystitis and likely undiagnosed dementia.  She presents from the facility with family with complaints of cough shortness of breath questionable hemoptysis, orthopnea and fever.  In the ED she was found to be profoundly tachycardic with hypoxia, imaging consistent with presumed pneumonia, hospitalist called for admission.  Patient continues to progress slowly, family wants to focus on comfort/quality of life - palliative to formally consult for possible placement/disposition assistance. DC restraints per family wishes and utilize medications.  Expect in-hospital demise  Assessment & Plan:   Principal Problem:   Sepsis due to pneumonia [facility]) Active Problems:   Acute metabolic encephalopathy   Chronic cystitis   Paroxysmal atrial fibrillation (HCC)   Chronic anticoagulation   Orthostatic hypotension   Anxiety   Hyperlipidemia with target LDL less than 130  Goals of care  - Discussed goals of care with both son and daughter on multiple occasions, both confirmed patient is DNR/DNI and would not want any heroic measures. - We discussed her high risk of decompensation given her worsening respiratory status over the last few hours requiring BiPAP support with profound tachycardia and ongoing mental status changes. - Family continues to be hopeful patient will recover but understands that should she continue to decline focusing on comfort measures is what she would want and we will do our best to comply with her wishes. - In regards for comfort will discontinue BiPAP and placed on heated high flow, continue IV Ativan  and morphine at low doses for respiratory support and anxiety - If patient improves over the next 24 to  48 hours discussed with palliative care to follow-up with family for ongoing planning moving forward - Patient continues to progress slowly, family wants to focus on comfort/quality of life - palliative to formally consult for possible placement/disposition assistance. DC restraints per family wishes and utilize medications.  Expect in-hospital demise  Acute on chronic respiratory failure with hypoxia Severe sepsis secondary to pneumonia Bilateral pleural effusion, small Acute metabolic encephalopathy On likely underlying dementia Chronic cystitis Paroxysmal atrial fibrillation on chronic anticoagulation, Heart failure with mildly reduced ejection fraction Orthostatic hypotension Anxiety Hyperlipidemia - Transition to comfort measures, see above  DVT prophylaxis: apixaban  (ELIQUIS ) tablet 5 mg  Code Status:   Code Status: Do not attempt resuscitation (DNR) - Comfort care Family Communication: At bedside, son, and daughter  Status is: Inpatient  Dispo: The patient is from: Facility              Anticipated d/c is to: To be determined              Anticipated d/c date is: To be determined              Patient currently not medically stable for discharge  Consultants:  None  Procedures:  None  Antimicrobials:  Azithromycin , ceftriaxone   Subjective: No acute issues or events overnight, transitioning to comfort measures as above  Objective: Vitals:   09/27/2023 0000 10/05/2023 0152 10/04/2023 0400 10/14/2023 0429  BP: 110/77  133/86   Pulse: (!) 139 (!) 141 (!) 141   Resp: (!) 23 (!) 26 (!) 37   Temp: 97.6 F (36.4 C)  97.8 F (36.6 C)   TempSrc: Oral  Oral   SpO2: 95%  91%   Weight:    70.1 kg    Intake/Output Summary (Last 24 hours) at 10/15/2023 0737 Last data filed at 09/18/2023 0135 Gross per 24 hour  Intake 100 ml  Output 300 ml  Net -200 ml   Filed Weights   10/01/23 0716 10/02/23 0500 09/30/2023 0429  Weight: 71.3 kg 71 kg 70.1 kg    Examination:  General:  Resting comfortably, tolerating heated high flow quite well Neck:  Without mass or deformity.  Trachea is midline. Lungs: Diffuse bilateral rhonchi with coarse breath sounds globally, tachypneic. Heart: Tachycardic, irregularly irregular Abdomen:  Soft, nontender, nondistended.  Without guarding or rebound. Extremities: Without cyanosis, clubbing, edema, or obvious deformity. Skin:  Warm and dry, no erythema. -Noted pressure ulcer on heel per staff, see documentation  Data Reviewed: I have personally reviewed following labs and imaging studies  CBC: Recent Labs  Lab 09/29/23 0733 09/30/23 0324  WBC 15.3* 14.8*  NEUTROABS 12.4*  --   HGB 11.2* 10.6*  HCT 35.5* 33.5*  MCV 92.4 91.8  PLT 242 246   Basic Metabolic Panel: Recent Labs  Lab 09/29/23 0733 09/30/23 0324  NA 139 140  K 3.1* 4.2  CL 105 107  CO2 21* 23  GLUCOSE 149* 138*  BUN 27* 22  CREATININE 1.00 0.86  CALCIUM  8.2* 8.3*   GFR: Estimated Creatinine Clearance: 39.9 mL/min (by C-G formula based on SCr of 0.86 mg/dL).  Liver Function Tests: Recent Labs  Lab 09/29/23 0733  AST 30  ALT 14  ALKPHOS 48  BILITOT 0.9  PROT 7.1  ALBUMIN 2.8*   Coagulation Profile: Recent Labs  Lab 09/29/23 0733  INR 1.3*   Sepsis Labs: Recent Labs  Lab 09/29/23 0733 09/29/23 0748 09/29/23 1052 09/29/23 1334 09/29/23 1957 09/29/23 2239  PROCALCITON 0.77  --   --   --   --   --   LATICACIDVEN  --    < > 5.3* 5.7* 2.0* 2.0*   < > = values in this interval not displayed.    Recent Results (from the past 240 hours)  Resp panel by RT-PCR (RSV, Flu A&B, Covid) Anterior Nasal Swab     Status: None   Collection Time: 09/29/23  7:25 AM   Specimen: Anterior Nasal Swab  Result Value Ref Range Status   SARS Coronavirus 2 by RT PCR NEGATIVE NEGATIVE Final   Influenza A by PCR NEGATIVE NEGATIVE Final   Influenza B by PCR NEGATIVE NEGATIVE Final    Comment: (NOTE) The Xpert Xpress SARS-CoV-2/FLU/RSV plus assay is intended  as an aid in the diagnosis of influenza from Nasopharyngeal swab specimens and should not be used as a sole basis for treatment. Nasal washings and aspirates are unacceptable for Xpert Xpress SARS-CoV-2/FLU/RSV testing.  Fact Sheet for Patients: BloggerCourse.com  Fact Sheet for Healthcare Providers: SeriousBroker.it  This test is not yet approved or cleared by the [place]  FDA and has been authorized for detection and/or diagnosis of SARS-CoV-2 by FDA under an Emergency Use Authorization (EUA). This EUA will remain in effect (meaning this test can be used) for the duration of the COVID-19 declaration under Section 564(b)(1) of the Act, 21 U.S.C. section 360bbb-3(b)(1), unless the authorization is terminated or revoked.     Resp Syncytial Virus by PCR NEGATIVE NEGATIVE Final    Comment: (NOTE) Fact Sheet for Patients: BloggerCourse.com  Fact Sheet for Healthcare Providers: SeriousBroker.it  This test is not yet approved or cleared by the [place]  FDA and has been  authorized for detection and/or diagnosis of SARS-CoV-2 by FDA under an Emergency Use Authorization (EUA). This EUA will remain in effect (meaning this test can be used) for the duration of the COVID-19 declaration under Section 564(b)(1) of the Act, 21 U.S.C. section 360bbb-3(b)(1), unless the authorization is terminated or revoked.  Performed at [facility], [address], [place], [place] [address]   Blood Culture (routine x 2)     Status: None (Preliminary result)   Collection Time: 09/29/23  7:25 AM   Specimen: BLOOD LEFT FOREARM  Result Value Ref Range Status   Specimen Description BLOOD LEFT FOREARM  Final   Special Requests   Final    BOTTLES DRAWN AEROBIC AND ANAEROBIC Blood Culture results may not be optimal due to an inadequate volume of blood received in culture bottles   Culture    Final    NO GROWTH 3 DAYS Performed at [facility], [address] [place], [place] [address]    Report Status PENDING  Incomplete  Blood Culture (routine x 2)     Status: None (Preliminary result)   Collection Time: 09/29/23  7:30 AM   Specimen: BLOOD  Result Value Ref Range Status   Specimen Description BLOOD RIGHT ANTECUBITAL  Final   Special Requests   Final    BOTTLES DRAWN AEROBIC AND ANAEROBIC Blood Culture results may not be optimal due to an inadequate volume of blood received in culture bottles   Culture   Final    NO GROWTH 3 DAYS Performed at [facility], [address], [place], [place] [address]    Report Status PENDING  Incomplete  Urine Culture     Status: Abnormal   Collection Time: 09/29/23  8:12 AM   Specimen: Urine, Random  Result Value Ref Range Status   Specimen Description URINE, RANDOM  Final   Special Requests NONE Reflexed from [id number]  Final   Culture (A)  Final    20,000 COLONIES/mL LACTOBACILLUS SPECIES Standardized susceptibility testing for this organism is not available. Performed at [facility], [address] [place], [place] [address]    Report Status 09/30/2023 FINAL  Final  MRSA Next Gen by PCR, Nasal     Status: None   Collection Time: 09/29/23 10:41 AM   Specimen: Nasal Mucosa; Nasal Swab  Result Value Ref Range Status   MRSA by PCR Next Gen NOT DETECTED NOT DETECTED Final    Comment: (NOTE) The GeneXpert MRSA Assay (FDA approved for NASAL specimens only), is one component of a comprehensive MRSA colonization surveillance program. It is not intended to diagnose MRSA infection nor to guide or monitor treatment for MRSA infections. Test performance is not FDA approved in patients less than 34 years old. Performed at [facility], [address], [place], [place] [address]     Radiology Studies: No results found.  Scheduled Meds:  ALPRAZolam   0.25 mg Oral QHS   amiodarone   100 mg Oral q morning    apixaban   5 mg Oral BID   escitalopram   20 mg Oral Daily   guaiFENesin   600 mg Oral BID   lactase  9,000 Units Oral TID   mirtazapine   30 mg Oral QHS   nitrofurantoin  (macrocrystal-monohydrate)  100 mg Oral QHS   predniSONE   40 mg Oral Q breakfast   psyllium  1 packet Oral Daily   saccharomyces boulardii  250 mg Oral BID   sodium chloride  flush  3 mL  Intravenous Q12H   Continuous Infusions:  cefTRIAXone  (ROCEPHIN )  IV Stopped (10/02/23 1018)     LOS: 4 days   Time spent: spent at bedside, discussing case with family in regards to goals of care, palliative care as well as current clinical status  Haydee Lipa, DO [facility]  If 7PM-7AM, please contact night-coverage [email or web address]  09/23/2023, 7:37 AM

## 2023-10-18 DEATH — deceased
